# Patient Record
Sex: Female | Born: 1995 | Race: Black or African American | Hispanic: No | Marital: Single | Smoking: Current some day smoker
Health system: Southern US, Community
[De-identification: ages and names within clinical notes are randomized; demographics above are authoritative.]

## PROBLEM LIST (undated history)

## (undated) ENCOUNTER — Inpatient Hospital Stay (HOSPITAL_COMMUNITY): Payer: Self-pay

## (undated) DIAGNOSIS — R1116 Cannabis hyperemesis syndrome: Secondary | ICD-10-CM

## (undated) DIAGNOSIS — O24419 Gestational diabetes mellitus in pregnancy, unspecified control: Secondary | ICD-10-CM

## (undated) DIAGNOSIS — Z862 Personal history of diseases of the blood and blood-forming organs and certain disorders involving the immune mechanism: Secondary | ICD-10-CM

## (undated) DIAGNOSIS — F329 Major depressive disorder, single episode, unspecified: Secondary | ICD-10-CM

## (undated) DIAGNOSIS — F419 Anxiety disorder, unspecified: Secondary | ICD-10-CM

## (undated) DIAGNOSIS — F1290 Cannabis use, unspecified, uncomplicated: Secondary | ICD-10-CM

## (undated) DIAGNOSIS — K219 Gastro-esophageal reflux disease without esophagitis: Secondary | ICD-10-CM

## (undated) DIAGNOSIS — R0989 Other specified symptoms and signs involving the circulatory and respiratory systems: Secondary | ICD-10-CM

## (undated) DIAGNOSIS — G43909 Migraine, unspecified, not intractable, without status migrainosus: Secondary | ICD-10-CM

## (undated) DIAGNOSIS — R519 Headache, unspecified: Secondary | ICD-10-CM

## (undated) DIAGNOSIS — F32A Depression, unspecified: Secondary | ICD-10-CM

## (undated) DIAGNOSIS — R102 Pelvic and perineal pain unspecified side: Secondary | ICD-10-CM

## (undated) DIAGNOSIS — K8020 Calculus of gallbladder without cholecystitis without obstruction: Secondary | ICD-10-CM

## (undated) DIAGNOSIS — D649 Anemia, unspecified: Secondary | ICD-10-CM

## (undated) DIAGNOSIS — R112 Nausea with vomiting, unspecified: Secondary | ICD-10-CM

## (undated) DIAGNOSIS — E669 Obesity, unspecified: Secondary | ICD-10-CM

## (undated) DIAGNOSIS — N83209 Unspecified ovarian cyst, unspecified side: Secondary | ICD-10-CM

## (undated) DIAGNOSIS — Z8632 Personal history of gestational diabetes: Secondary | ICD-10-CM

## (undated) DIAGNOSIS — E119 Type 2 diabetes mellitus without complications: Secondary | ICD-10-CM

## (undated) DIAGNOSIS — R51 Headache: Secondary | ICD-10-CM

## (undated) DIAGNOSIS — N838 Other noninflammatory disorders of ovary, fallopian tube and broad ligament: Secondary | ICD-10-CM

## (undated) HISTORY — PX: TUBAL LIGATION: SHX77

## (undated) HISTORY — DX: Obesity, unspecified: E66.9

## (undated) HISTORY — DX: Other noninflammatory disorders of ovary, fallopian tube and broad ligament: N83.8

## (undated) HISTORY — DX: Gestational diabetes mellitus in pregnancy, unspecified control: O24.419

## (undated) HISTORY — PX: WISDOM TOOTH EXTRACTION: SHX21

---

## 2016-02-18 ENCOUNTER — Encounter (HOSPITAL_COMMUNITY): Payer: Self-pay

## 2016-02-18 ENCOUNTER — Inpatient Hospital Stay (HOSPITAL_COMMUNITY)
Admission: AD | Admit: 2016-02-18 | Discharge: 2016-02-19 | Disposition: A | Discharge status: Home / Self Care | Payer: Medicaid Other | Source: Ambulatory Visit | Attending: Obstetrics and Gynecology | Admitting: Obstetrics and Gynecology

## 2016-02-18 ENCOUNTER — Inpatient Hospital Stay (HOSPITAL_COMMUNITY): Payer: Medicaid Other

## 2016-02-18 DIAGNOSIS — R102 Pelvic and perineal pain: Secondary | ICD-10-CM | POA: Insufficient documentation

## 2016-02-18 DIAGNOSIS — A5901 Trichomonal vulvovaginitis: Secondary | ICD-10-CM | POA: Diagnosis present

## 2016-02-18 DIAGNOSIS — O98311 Other infections with a predominantly sexual mode of transmission complicating pregnancy, first trimester: Secondary | ICD-10-CM | POA: Diagnosis not present

## 2016-02-18 DIAGNOSIS — N838 Other noninflammatory disorders of ovary, fallopian tube and broad ligament: Secondary | ICD-10-CM | POA: Diagnosis not present

## 2016-02-18 DIAGNOSIS — N760 Acute vaginitis: Secondary | ICD-10-CM | POA: Insufficient documentation

## 2016-02-18 DIAGNOSIS — Z3A01 Less than 8 weeks gestation of pregnancy: Secondary | ICD-10-CM | POA: Diagnosis not present

## 2016-02-18 DIAGNOSIS — O23591 Infection of other part of genital tract in pregnancy, first trimester: Secondary | ICD-10-CM | POA: Diagnosis not present

## 2016-02-18 DIAGNOSIS — O26891 Other specified pregnancy related conditions, first trimester: Secondary | ICD-10-CM

## 2016-02-18 LAB — URINALYSIS, ROUTINE W REFLEX MICROSCOPIC
BILIRUBIN URINE: NEGATIVE
GLUCOSE, UA: NEGATIVE mg/dL
Hgb urine dipstick: NEGATIVE
KETONES UR: NEGATIVE mg/dL
Leukocytes, UA: NEGATIVE
NITRITE: NEGATIVE
PH: 5.5 (ref 5.0–8.0)
Protein, ur: NEGATIVE mg/dL
Specific Gravity, Urine: >1.03 — ABNORMAL HIGH (ref 1.005–1.030)

## 2016-02-18 LAB — CBC
HCT: 33.1 % — ABNORMAL LOW (ref 36.0–46.0)
Hemoglobin: 11 g/dL — ABNORMAL LOW (ref 12.0–15.0)
MCH: 25.6 pg — AB (ref 26.0–34.0)
MCHC: 33.2 g/dL (ref 30.0–36.0)
MCV: 77 fL — AB (ref 78.0–100.0)
PLATELETS: 354 10*3/uL (ref 150–400)
RBC: 4.3 MIL/uL (ref 3.87–5.11)
RDW: 15.1 % (ref 11.5–15.5)
WBC: 11.7 10*3/uL — ABNORMAL HIGH (ref 4.0–10.5)

## 2016-02-18 LAB — POCT PREGNANCY, URINE: Preg Test, Ur: POSITIVE — AB

## 2016-02-18 MED ORDER — FAMOTIDINE 20 MG PO TABS
20.0000 mg | ORAL_TABLET | Freq: Once | ORAL | Status: AC
  Administered 2016-02-19: 20 mg via ORAL
  Filled 2016-02-18: qty 1

## 2016-02-18 NOTE — MAU Provider Note (Signed)
Chief Complaint: Possible Pregnancy and Pelvic Pain   First Provider Initiated Contact with Patient 02/18/16 2248     SUBJECTIVE HPI: Christine Trujillo is a 20 y.o. G1P0 at 5474w2d who presents to [facility]aternity Admissions reporting nipple soreness 2 weeks and pelvic pain 1 week. Denies bleeding.  Location: Pelvis Quality: Cramping Severity: 4/10 on pain scale Duration: One week Course: Unchanged Context: Early pregnancy Timing: Intermittent Modifying factors: None. Hasn't tried anything for the pain. Associated signs and symptoms: Negative for vaginal bleeding, vaginal discharge, fever, chills, urinary complaints, diarrhea or constipation.  Past Medical History:  Diagnosis Date  . Medical history non-contributory    OB History  Gravida Para Term Preterm AB Living  1            SAB TAB Ectopic Multiple Live Births               # Outcome Date GA Lbr Len/2nd Weight Sex Delivery Anes PTL Lv  1 Current              Past Surgical History:  Procedure Laterality Date  . WISDOM TOOTH EXTRACTION     Social History   Social History  . Marital status: Single    Spouse name: N/A  . Number of children: N/A  . Years of education: N/A   Occupational History  . Not on file.   Social History Main Topics  . Smoking status: Never Smoker  . Smokeless tobacco: Never Used  . Alcohol use No  . Drug use:     Types: Marijuana     Comment: last use Nov 2017  . Sexual activity: Yes    Other Topics Concern  . Not on file   Social History Narrative  . No narrative on file   No current facility-administered medications on file prior to encounter.    No current outpatient prescriptions on file prior to encounter.   No Known Allergies  I have reviewed the past Medical Hx, Surgical Hx, Social Hx, Allergies and Medications.   Review of Systems  Constitutional: Negative for chills and fever.  Gastrointestinal: Negative for abdominal pain, constipation, diarrhea, nausea and vomiting.   Genitourinary: Positive for pelvic pain. Negative for dysuria, frequency, hematuria, urgency, vaginal bleeding and vaginal discharge.    OBJECTIVE Patient Vitals for the past 24 hrs:  BP Temp Temp src Pulse Resp SpO2 Height Weight  02/19/16 0157 128/86 98 F (36.7 C) Oral 85 17 100 % - -  02/18/16 2241 131/78 98.3 F (36.8 C) Oral 88 16 100 % - -  02/18/16 2228 - - - - - - 5\' 3"  (1.6 m) 278 lb 8 oz (126.3 kg)   Constitutional: Well-developed, well-nourished, obese female in no acute distress.  Cardiovascular: normal rate Respiratory: normal rate and effort.  GI: Abd soft, non-tender. Pos BS x 4 MS: Extremities nontender, no edema, normal ROM Neurologic: Alert and oriented x 4.  GU: Neg CVAT.  SPECULUM EXAM: NEFG, small amount of thin, white, mildly malodorous discharge, no blood noted, cervix clean  BIMANUAL: cervix closed; unable to assess uterine size due to body habitus, no adnexal tenderness or masses. No CMT.  LAB RESULTS Results for orders placed or performed during the hospital encounter of 02/18/16 (from the past 24 hour(s))  Urinalysis, Routine w reflex microscopic (not at [facility])     Status: Abnormal   Collection Time: 02/18/16 10:24 PM  Result Value Ref Range   Color, Urine YELLOW YELLOW   APPearance CLEAR CLEAR  Specific Gravity, Urine >1.030 (H) 1.005 - 1.030   pH 5.5 5.0 - 8.0   Glucose, UA NEGATIVE NEGATIVE mg/dL   Hgb urine dipstick NEGATIVE NEGATIVE   Bilirubin Urine NEGATIVE NEGATIVE   Ketones, ur NEGATIVE NEGATIVE mg/dL   Protein, ur NEGATIVE NEGATIVE mg/dL   Nitrite NEGATIVE NEGATIVE   Leukocytes, UA NEGATIVE NEGATIVE  Pregnancy, urine POC     Status: Abnormal   Collection Time: 02/18/16 10:34 PM  Result Value Ref Range   Preg Test, Ur POSITIVE (A) NEGATIVE  hCG, quantitative, pregnancy     Status: Abnormal   Collection Time: 02/18/16 11:06 PM  Result Value Ref Range   hCG, Beta Chain, Quant, S 3,029 (H) <5 mIU/mL  CBC     Status: Abnormal    Collection Time: 02/18/16 11:06 PM  Result Value Ref Range   WBC 11.7 (H) 4.0 - 10.5 K/uL   RBC 4.30 3.87 - 5.11 MIL/uL   Hemoglobin 11.0 (L) 12.0 - 15.0 g/dL   HCT 96.033.1 (L) 45.436.0 - 09.846.0 %   MCV 77.0 (L) 78.0 - 100.0 fL   MCH 25.6 (L) 26.0 - 34.0 pg   MCHC 33.2 30.0 - 36.0 g/dL   RDW 11.915.1 14.711.5 - 82.915.5 %   Platelets 354 150 - 400 K/uL  ABO/Rh     Status: None (Preliminary result)   Collection Time: 02/18/16 11:06 PM  Result Value Ref Range   ABO/RH(D) B POS   Wet prep, genital     Status: Abnormal   Collection Time: 02/19/16 12:49 AM  Result Value Ref Range   Yeast Wet Prep HPF POC NONE SEEN NONE SEEN   Trich, Wet Prep PRESENT (A) NONE SEEN   Clue Cells Wet Prep HPF POC PRESENT (A) NONE SEEN   WBC, Wet Prep HPF POC FEW (A) NONE SEEN   Sperm NONE SEEN     IMAGING [place] Ob Comp Less 14 Wks  Result Date: 02/18/2016 CLINICAL DATA:  Pelvic pain for 1 week EXAM: OBSTETRIC <14 WK US AND TRANSVAGINAL OB US TECHNIQUE: Both transabdominal and transvaginal ultrasound examinations were performed for complete evaluation of the gestation as well as the maternal uterus, adnexal regions, and pelvic cul-de-sac. Transvaginal technique was performed to assess early pregnancy. COMPARISON:  None. FINDINGS: Intrauterine gestational sac: Single Yolk sac:  Visualized. Embryo:  Not Visualized. Cardiac Activity: Not Visualized. Heart Rate: Not applicable MSD: 6.5  mm   5 w   2  d Subchorionic hemorrhage:  None visualized. There is trace free fluid. Maternal uterus/adnexae: Within the left adnexa, separate from the left ovary is an anechoic simple appearing well-circumscribed 16.7 x 9 x 14.4 cm cystic mass without mural nodularity or septation. Differential possibilities might include a large paraovarian cyst, enteric duplication cyst or possibly seroma from prior surgery among some considerations though not exclusive. IMPRESSION: Probable early intrauterine pregnancy with gestational sac and yolk sac but no fetal pole,  or cardiac activity yet visualized. Recommend follow-up quantitative B-HCG levels and follow-up US in 14 days to assess viability. This recommendation follows SRU consensus guidelines: Malva Limes Engl J Med 2013; 562:1308-65369:1443-51. Left adnexal anechoic circumscribed 16.7 x 9 x 14.4 cm simple cyst. This will also need sonographic monitoring. Cross-sectional imaging is not suggested at this time given the possibility of early intrauterine gestation. Electronically Signed   By: Tollie Ethavid  Kwon M.D.   On: 02/18/2016 23:59   [place] Ob Transvaginal  Result Date: 02/18/2016 CLINICAL DATA:  Pelvic pain for 1 week EXAM: OBSTETRIC <14 WK  [place] AND TRANSVAGINAL OB US TECHNIQUE: Both transabdominal and transvaginal ultrasound examinations were performed for complete evaluation of the gestation as well as the maternal uterus, adnexal regions, and pelvic cul-de-sac. Transvaginal technique was performed to assess early pregnancy. COMPARISON:  None. FINDINGS: Intrauterine gestational sac: Single Yolk sac:  Visualized. Embryo:  Not Visualized. Cardiac Activity: Not Visualized. Heart Rate: Not applicable MSD: 6.5  mm   5 w   2  d Subchorionic hemorrhage:  None visualized. There is trace free fluid. Maternal uterus/adnexae: Within the left adnexa, separate from the left ovary is an anechoic simple appearing well-circumscribed 16.7 x 9 x 14.4 cm cystic mass without mural nodularity or septation. Differential possibilities might include a large paraovarian cyst, enteric duplication cyst or possibly seroma from prior surgery among some considerations though not exclusive. IMPRESSION: Probable early intrauterine pregnancy with gestational sac and yolk sac but no fetal pole, or cardiac activity yet visualized. Recommend follow-up quantitative B-HCG levels and follow-up US in 14 days to assess viability. This recommendation follows SRU consensus guidelines: Malva Limes Med 2013; 409:8119-14. Left adnexal anechoic circumscribed 16.7 x 9 x 14.4 cm simple cyst. This  will also need sonographic monitoring. Cross-sectional imaging is not suggested at this time given the possibility of early intrauterine gestation. Electronically Signed   By: Tollie Eth M.D.   On: 02/18/2016 23:59    MAU COURSE CBC, Quant, ABO/Rh, ultrasound, wet prep and GC/chlamydia culture, UA.  Notified of diagnosis of trichomonas. Patient treated with Flagyl in MAU. Discussed with patient's partner, Verline Lema, date of birth 05/23/1982. Expedited partner therapy prescription supplied. Verified that partner does not have allergy to Flagyl. Patient and her partner admit that they had a third sex partner. CNM offered expedited partner therapy to her as well, but they said that they're no longer involved with her. CNM recommended that they inform her and recommended that she get treated.  MDM - Pain in early pregnancy with normal intrauterine pregnancy and hemodynamically stable. Pain likely due to paratubal cyst. Patient in no distress. No evidence of torsion.  -  Trichomonas, treated  ASSESSMENT 1. Pelvic pain affecting pregnancy in first trimester, antepartum   2. Vaginal trichomoniasis   3. Paratubal cyst     PLAN Discharge home in stable conditionPer consult with Dr. Emelda Fear. First trimester and torsion precautions Pregnancy verification letter given. Comfort measures, Tylenol when necessary. Rx prenatal vitamin. Follow-up Information    [facility].   Specialty:  Obstetrics and Gynecology Why:  Will call you to schedule your new OB appointment Contact information: [address] [place] [address] [phone]       [facility]  MATERNITY ADMISSIONS Follow up.   Why:  As needed for pregnancy emergencies Contact information: [address] [id number] mc [place] [address] [phone]           Medication List    TAKE these medications   CONCEPT OB 130-92.4-1  MG Caps Take 1 tablet by mouth daily.        Revillo, CNM 02/19/2016  2:31 AM  4

## 2016-02-18 NOTE — MAU Note (Signed)
Nipples hurting x 2 weeks.  Pelvic pain x 1 week.  No bleeding.

## 2016-02-19 ENCOUNTER — Encounter: Payer: Self-pay | Admitting: Obstetrics & Gynecology

## 2016-02-19 DIAGNOSIS — N838 Other noninflammatory disorders of ovary, fallopian tube and broad ligament: Secondary | ICD-10-CM

## 2016-02-19 DIAGNOSIS — A5901 Trichomonal vulvovaginitis: Secondary | ICD-10-CM | POA: Diagnosis present

## 2016-02-19 DIAGNOSIS — O98311 Other infections with a predominantly sexual mode of transmission complicating pregnancy, first trimester: Secondary | ICD-10-CM

## 2016-02-19 HISTORY — DX: Other noninflammatory disorders of ovary, fallopian tube and broad ligament: N83.8

## 2016-02-19 LAB — WET PREP, GENITAL
Sperm: NONE SEEN
Yeast Wet Prep HPF POC: NONE SEEN

## 2016-02-19 LAB — HIV ANTIBODY (ROUTINE TESTING W REFLEX): HIV Screen 4th Generation wRfx: NONREACTIVE

## 2016-02-19 LAB — ABO/RH: ABO/RH(D): B POS

## 2016-02-19 LAB — HCG, QUANTITATIVE, PREGNANCY: HCG, BETA CHAIN, QUANT, S: 3029 m[IU]/mL — AB (ref <5)

## 2016-02-19 LAB — GC/CHLAMYDIA PROBE AMP (~~LOC~~) NOT AT ARMC
Chlamydia: NEGATIVE
NEISSERIA GONORRHEA: NEGATIVE

## 2016-02-19 MED ORDER — PROMETHAZINE HCL 25 MG PO TABS
25.0000 mg | ORAL_TABLET | Freq: Once | ORAL | Status: AC
  Administered 2016-02-19: 25 mg via ORAL
  Filled 2016-02-19: qty 1

## 2016-02-19 MED ORDER — METRONIDAZOLE 500 MG PO TABS
2000.0000 mg | ORAL_TABLET | Freq: Once | ORAL | Status: AC
  Administered 2016-02-19: 2000 mg via ORAL
  Filled 2016-02-19: qty 4

## 2016-02-19 MED ORDER — CONCEPT OB 130-92.4-1 MG PO CAPS: 1.0000 | ORAL_CAPSULE | Freq: Every day | ORAL | 12 refills | Status: DC

## 2016-02-19 NOTE — Discharge Instructions (Signed)
You have been diagnosed with a paratubal cyst measuring 16 cm. It is uncertain whether you'll require any treatment for this cyst during the pregnancy, but it will be monitored at your anatomy ultrasound and after the pregnancy.  Trichomoniasis Trichomoniasis is an infection caused by an organism called Trichomonas. The infection can affect both women and men. In women, the outer female genitalia and the vagina are affected. In men, the penis is mainly affected, but the prostate and other reproductive organs can also be involved. Trichomoniasis is a sexually transmitted infection (STI) and is most often passed to another person through sexual contact.  RISK FACTORS  Having unprotected sexual intercourse.  Having sexual intercourse with an infected partner. SIGNS AND SYMPTOMS  Symptoms of trichomoniasis in women include:  Abnormal gray-green frothy vaginal discharge.  Itching and irritation of the vagina.  Itching and irritation of the area outside the vagina. Symptoms of trichomoniasis in men include:   Penile discharge with or without pain.  Pain during urination. This results from inflammation of the urethra. DIAGNOSIS  Trichomoniasis may be found during a Pap test or physical exam. Your health care provider may use one of the following methods to help diagnose this infection:  Testing the pH of the vagina with a test tape.  Using a vaginal swab test that checks for the Trichomonas organism. A test is available that provides results within a few minutes.  Examining a urine sample.  Testing vaginal secretions. Your health care provider may test you for other STIs, including HIV. TREATMENT   You may be given medicine to fight the infection. Women should inform their health care provider if they could be or are pregnant. Some medicines used to treat the infection should not be taken during pregnancy.  Your health care provider may recommend over-the-counter medicines or creams to  decrease itching or irritation.  Your sexual partner will need to be treated if infected.  Your health care provider may test you for infection again 3 months after treatment. HOME CARE INSTRUCTIONS   Take medicines only as directed by your health care provider.  Take over-the-counter medicine for itching or irritation as directed by your health care provider.  Do not have sexual intercourse while you have the infection.  Women should not douche or wear tampons while they have the infection.  Discuss your infection with your partner. Your partner may have gotten the infection from you, or you may have gotten it from your partner.  Have your sex partner get examined and treated if necessary.  Practice safe, informed, and protected sex.  See your health care provider for other STI testing. SEEK MEDICAL CARE IF:   You still have symptoms after you finish your medicine.  You develop abdominal pain.  You have pain when you urinate.  You have bleeding after sexual intercourse.  You develop a rash.  Your medicine makes you sick or makes you throw up (vomit). MAKE SURE YOU:  Understand these instructions.  Will watch your condition.  Will get help right away if you are not doing well or get worse.   This information is not intended to replace advice given to you by your health care provider. Make sure you discuss any questions you have with your health care provider.   Document Released: 09/22/2000 Document Revised: 04/19/2014 Document Reviewed: 01/08/2013 Elsevier Interactive Patient Education [employer]2016 [employer] Inc[employer]   First Trimester of Pregnancy The first trimester of pregnancy is from week 1 until the end of week 12 (  months 1 through 3). A week after a sperm fertilizes an egg, the egg will implant on the wall of the uterus. This embryo will begin to develop into a baby. Genes from you and your partner are forming the baby. The female genes determine whether the baby is a boy  or a girl. At 6-8 weeks, the eyes and face are formed, and the heartbeat can be seen on ultrasound. At the end of 12 weeks, all the baby's organs are formed.  Now that you are pregnant, you will want to do everything you can to have a healthy baby. Two of the most important things are to get good prenatal care and to follow your health care provider's instructions. Prenatal care is all the medical care you receive before the baby's birth. This care will help prevent, find, and treat any problems during the pregnancy and childbirth. BODY CHANGES Your body goes through many changes during pregnancy. The changes vary from woman to woman.   You may gain or lose a couple of pounds at first.  You may feel sick to your stomach (nauseous) and throw up (vomit). If the vomiting is uncontrollable, call your health care provider.  You may tire easily.  You may develop headaches that can be relieved by medicines approved by your health care provider.  You may urinate more often. Painful urination may mean you have a bladder infection.  You may develop heartburn as a result of your pregnancy.  You may develop constipation because certain hormones are causing the muscles that push waste through your intestines to slow down.  You may develop hemorrhoids or swollen, bulging veins (varicose veins).  Your breasts may begin to grow larger and become tender. Your nipples may stick out more, and the tissue that surrounds them (areola) may become darker.  Your gums may bleed and may be sensitive to brushing and flossing.  Dark spots or blotches (chloasma, mask of pregnancy) may develop on your face. This will likely fade after the baby is born.  Your menstrual periods will stop.  You may have a loss of appetite.  You may develop cravings for certain kinds of food.  You may have changes in your emotions from day to day, such as being excited to be pregnant or being concerned that something may go wrong with  the pregnancy and baby.  You may have more vivid and strange dreams.  You may have changes in your hair. These can include thickening of your hair, rapid growth, and changes in texture. Some women also have hair loss during or after pregnancy, or hair that feels dry or thin. Your hair will most likely return to normal after your baby is born. WHAT TO EXPECT AT YOUR PRENATAL VISITS During a routine prenatal visit:  You will be weighed to make sure you and the baby are growing normally.  Your blood pressure will be taken.  Your abdomen will be measured to track your baby's growth.  The fetal heartbeat will be listened to starting around week 10 or 12 of your pregnancy.  Test results from any previous visits will be discussed. Your health care provider may ask you:  How you are feeling.  If you are feeling the baby move.  If you have had any abnormal symptoms, such as leaking fluid, bleeding, severe headaches, or abdominal cramping.  If you are using any tobacco products, including cigarettes, chewing tobacco, and electronic cigarettes.  If you have any questions. Other tests that may be  performed during your first trimester include:  Blood tests to find your blood type and to check for the presence of any previous infections. They will also be used to check for low iron levels (anemia) and Rh antibodies. Later in the pregnancy, blood tests for diabetes will be done along with other tests if problems develop.  Urine tests to check for infections, diabetes, or protein in the urine.  An ultrasound to confirm the proper growth and development of the baby.  An amniocentesis to check for possible genetic problems.  Fetal screens for spina bifida and Down syndrome.  You may need other tests to make sure you and the baby are doing well.  HIV (human immunodeficiency virus) testing. Routine prenatal testing includes screening for HIV, unless you choose not to have this test. HOME CARE  INSTRUCTIONS  Medicines  Follow your health care provider's instructions regarding medicine use. Specific medicines may be either safe or unsafe to take during pregnancy.  Take your prenatal vitamins as directed.  If you develop constipation, try taking a stool softener if your health care provider approves. Diet  Eat regular, well-balanced meals. Choose a variety of foods, such as meat or vegetable-based protein, fish, milk and low-fat dairy products, vegetables, fruits, and whole grain breads and cereals. Your health care provider will help you determine the amount of weight gain that is right for you.  Avoid raw meat and uncooked cheese. These carry germs that can cause birth defects in the baby.  Eating four or five small meals rather than three large meals a day may help relieve nausea and vomiting. If you start to feel nauseous, eating a few soda crackers can be helpful. Drinking liquids between meals instead of during meals also seems to help nausea and vomiting.  If you develop constipation, eat more high-fiber foods, such as fresh vegetables or fruit and whole grains. Drink enough fluids to keep your urine clear or pale yellow. Activity and Exercise  Exercise only as directed by your health care provider. Exercising will help you:  Control your weight.  Stay in shape.  Be prepared for labor and delivery.  Experiencing pain or cramping in the lower abdomen or low back is a good sign that you should stop exercising. Check with your health care provider before continuing normal exercises.  Try to avoid standing for long periods of time. Move your legs often if you must stand in one place for a long time.  Avoid heavy lifting.  Wear low-heeled shoes, and practice good posture.  You may continue to have sex unless your health care provider directs you otherwise. Relief of Pain or Discomfort  Wear a good support bra for breast tenderness.   Take warm sitz baths to soothe  any pain or discomfort caused by hemorrhoids. Use hemorrhoid cream if your health care provider approves.   Rest with your legs elevated if you have leg cramps or low back pain.  If you develop varicose veins in your legs, wear support hose. Elevate your feet for 15 minutes, 3-4 times a day. Limit salt in your diet. Prenatal Care  Schedule your prenatal visits by the twelfth week of pregnancy. They are usually scheduled monthly at first, then more often in the last 2 months before delivery.  Write down your questions. Take them to your prenatal visits.  Keep all your prenatal visits as directed by your health care provider. Safety  Wear your seat belt at all times when driving.  Make a list  of emergency phone numbers, including numbers for family, friends, the hospital, and police and fire departments. General Tips  Ask your health care provider for a referral to a local prenatal education class. Begin classes no later than at the beginning of month 6 of your pregnancy.  Ask for help if you have counseling or nutritional needs during pregnancy. Your health care provider can offer advice or refer you to specialists for help with various needs.  Do not use hot tubs, steam rooms, or saunas.  Do not douche or use tampons or scented sanitary pads.  Do not cross your legs for long periods of time.  Avoid cat litter boxes and soil used by cats. These carry germs that can cause birth defects in the baby and possibly loss of the fetus by miscarriage or stillbirth.  Avoid all smoking, herbs, alcohol, and medicines not prescribed by your health care provider. Chemicals in these affect the formation and growth of the baby.  Do not use any tobacco products, including cigarettes, chewing tobacco, and electronic cigarettes. If you need help quitting, ask your health care provider. You may receive counseling support and other resources to help you quit.  Schedule a dentist appointment. At home,  brush your teeth with a soft toothbrush and be gentle when you floss. SEEK MEDICAL CARE IF:   You have dizziness.  You have mild pelvic cramps, pelvic pressure, or nagging pain in the abdominal area.  You have persistent nausea, vomiting, or diarrhea.  You have a bad smelling vaginal discharge.  You have pain with urination.  You notice increased swelling in your face, hands, legs, or ankles. SEEK IMMEDIATE MEDICAL CARE IF:   You have a fever.  You are leaking fluid from your vagina.  You have spotting or bleeding from your vagina.  You have severe abdominal cramping or pain.  You have rapid weight gain or loss.  You vomit blood or material that looks like coffee grounds.  You are exposed to [place] measles and have never had them.  You are exposed to fifth disease or chickenpox.  You develop a severe headache.  You have shortness of breath.  You have any kind of trauma, such as from a fall or a car accident.   This information is not intended to replace advice given to you by your health care provider. Make sure you discuss any questions you have with your health care provider.   Document Released: 03/23/2001 Document Revised: 04/19/2014 Document Reviewed: 02/06/2013 Elsevier Interactive Patient Education [employer]

## 2016-03-18 ENCOUNTER — Telehealth: Payer: Self-pay

## 2016-03-18 NOTE — Telephone Encounter (Signed)
error 

## 2016-03-25 ENCOUNTER — Ambulatory Visit (INDEPENDENT_AMBULATORY_CARE_PROVIDER_SITE_OTHER): Payer: Medicaid Other | Admitting: Family

## 2016-03-25 ENCOUNTER — Encounter: Payer: Self-pay | Admitting: Family

## 2016-03-25 ENCOUNTER — Ambulatory Visit: Payer: Self-pay

## 2016-03-25 ENCOUNTER — Other Ambulatory Visit (HOSPITAL_COMMUNITY)
Admission: RE | Admit: 2016-03-25 | Discharge: 2016-03-25 | Disposition: A | Discharge status: Home / Self Care | Payer: Medicaid Other | Source: Ambulatory Visit | Attending: Family | Admitting: Family

## 2016-03-25 VITALS — BP 139/64 | HR 84 | Wt 274.2 lb

## 2016-03-25 DIAGNOSIS — Z3401 Encounter for supervision of normal first pregnancy, first trimester: Secondary | ICD-10-CM

## 2016-03-25 DIAGNOSIS — O219 Vomiting of pregnancy, unspecified: Secondary | ICD-10-CM | POA: Diagnosis not present

## 2016-03-25 DIAGNOSIS — O3482 Maternal care for other abnormalities of pelvic organs, second trimester: Secondary | ICD-10-CM

## 2016-03-25 DIAGNOSIS — Z01419 Encounter for gynecological examination (general) (routine) without abnormal findings: Secondary | ICD-10-CM | POA: Insufficient documentation

## 2016-03-25 DIAGNOSIS — Z113 Encounter for screening for infections with a predominantly sexual mode of transmission: Secondary | ICD-10-CM | POA: Insufficient documentation

## 2016-03-25 DIAGNOSIS — N949 Unspecified condition associated with female genital organs and menstrual cycle: Secondary | ICD-10-CM | POA: Insufficient documentation

## 2016-03-25 DIAGNOSIS — O3480 Maternal care for other abnormalities of pelvic organs, unspecified trimester: Secondary | ICD-10-CM | POA: Diagnosis not present

## 2016-03-25 DIAGNOSIS — Z3689 Encounter for other specified antenatal screening: Secondary | ICD-10-CM

## 2016-03-25 DIAGNOSIS — O3680X0 Pregnancy with inconclusive fetal viability, not applicable or unspecified: Secondary | ICD-10-CM

## 2016-03-25 DIAGNOSIS — N83209 Unspecified ovarian cyst, unspecified side: Secondary | ICD-10-CM

## 2016-03-25 DIAGNOSIS — O0992 Supervision of high risk pregnancy, unspecified, second trimester: Secondary | ICD-10-CM | POA: Insufficient documentation

## 2016-03-25 DIAGNOSIS — Z3400 Encounter for supervision of normal first pregnancy, unspecified trimester: Secondary | ICD-10-CM

## 2016-03-25 MED ORDER — PROMETHAZINE HCL 12.5 MG PO TABS: 12.5000 mg | ORAL_TABLET | Freq: Four times a day (QID) | ORAL | 0 refills | Status: DC | PRN

## 2016-03-25 NOTE — Progress Notes (Signed)
  Subjective:    Christine Trujillo is a G1P0 6750w3d being seen today for her first obstetrical visit.  Her obstetrical history is significant for lleft ovarian cyst identified in first trimester. [facility] (see Care Everywhere).  Here with FOB.  This is her first child and his 5th.  Patient does not intend to breast feed. Worried nipples may hurt.   Pregnancy history fully reviewed.  Patient reports no bleeding and intermittent pelvic pain.  Vitals:   03/25/16 0923  BP: 139/64  Pulse: 84  Weight: 274 lb 3.2 oz (124.4 kg)    HISTORY: OB History  Gravida Para Term Preterm AB Living  1            SAB TAB Ectopic Multiple Live Births               # Outcome Date GA Lbr Len/2nd Weight Sex Delivery Anes PTL Lv  1 Current              Past Medical History:  Diagnosis Date  . Medical history non-contributory    Past Surgical History:  Procedure Laterality Date  . WISDOM TOOTH EXTRACTION     Family History  Problem Relation Age of Onset  . Heart disease Mother      Exam    BP 139/64   Pulse 84   Wt 274 lb 3.2 oz (124.4 kg)   LMP 01/12/2016 Comment: home UPT x2 positive  BMI 48.57 kg/m  Uterine Size: size equals dates  Pelvic Exam:    Perineum: Small Hemorrhoid, Normal Perineum   Vulva: normal   Vagina:  normal mucosa, normal discharge, no palpable nodules   pH: Not done   Cervix: no bleeding following Pap, no cervical motion tenderness and no lesions   Adnexa: normal adnexa and no mass, fullness, tenderness   Bony Pelvis: Adequate  System: Breast:  No nipple retraction or dimpling, No nipple discharge or bleeding, No axillary or supraclavicular adenopathy, Normal to palpation without dominant masses   Skin: normal coloration and turgor, no rashes    Neurologic: negative   Extremities: normal strength, tone, and muscle mass   HEENT neck supple with midline trachea and thyroid without masses   Mouth/Teeth mucous membranes moist,  pharynx normal without lesions   Neck supple and no masses   Cardiovascular: regular rate and rhythm, no murmurs or gallops   Respiratory:  appears well, vitals normal, no respiratory distress, acyanotic, normal RR, neck free of mass or lymphadenopathy, chest clear, no wheezing, crepitations, rhonchi, normal symmetric air entry   Abdomen: soft, non-tender; bowel sounds normal; no masses,  no organomegaly   Urinary: urethral meatus normal     Assessment:    Pregnancy: G1P0 Patient Active Problem List   Diagnosis Date Noted  . Supervision of normal pregnancy, antepartum 03/25/2016  . Ovarian cyst affecting pregnancy, antepartum 03/25/2016  . Paratubal cyst 02/19/2016  . Vaginal trichomoniasis in Pregnancy 02/19/2016        Plan:     Initial labs NOT drawn - pt left after viability scan. Pap smear collected. Prenatal vitamins. Problem list reviewed and updated. Genetic Screening discussed First Screen: ordered. Consulted with Dr. Alysia PennaErvin > repeat ovarian scan with NT Follow up in 4 weeks.  Christine EdelsonKARIM, Christine Trujillo N 03/25/2016

## 2016-03-25 NOTE — Patient Instructions (Signed)
Second Trimester of Pregnancy The second trimester is from week 13 through week 28 (months 4 through 6). The second trimester is often a time when you feel your best. Your body has also adjusted to being pregnant, and you begin to feel better physically. Usually, morning sickness has lessened or quit completely, you may have more energy, and you may have an increase in appetite. The second trimester is also a time when the fetus is growing rapidly. At the end of the sixth month, the fetus is about 9 inches long and weighs about 1 pounds. You will likely begin to feel the baby move (quickening) between 18 and 20 weeks of the pregnancy. Body changes during your second trimester Your body continues to go through many changes during your second trimester. The changes vary from woman to woman.  Your weight will continue to increase. You will notice your lower abdomen bulging out.  You may begin to get stretch marks on your hips, abdomen, and breasts.  You may develop headaches that can be relieved by medicines. The medicines should be approved by your health care provider.  You may urinate more often because the fetus is pressing on your bladder.  You may develop or continue to have heartburn as a result of your pregnancy.  You may develop constipation because certain hormones are causing the muscles that push waste through your intestines to slow down.  You may develop hemorrhoids or swollen, bulging veins (varicose veins).  You may have back pain. This is caused by:  Weight gain.  Pregnancy hormones that are relaxing the joints in your pelvis.  A shift in weight and the muscles that support your balance.  Your breasts will continue to grow and they will continue to become tender.  Your gums may bleed and may be sensitive to brushing and flossing.  Dark spots or blotches (chloasma, mask of pregnancy) may develop on your face. This will likely fade after the baby is born.  A dark line  from your belly button to the pubic area (linea nigra) may appear. This will likely fade after the baby is born.  You may have changes in your hair. These can include thickening of your hair, rapid growth, and changes in texture. Some women also have hair loss during or after pregnancy, or hair that feels dry or thin. Your hair will most likely return to normal after your baby is born. What to expect at prenatal visits During a routine prenatal visit:  You will be weighed to make sure you and the fetus are growing normally.  Your blood pressure will be taken.  Your abdomen will be measured to track your baby's growth.  The fetal heartbeat will be listened to.  Any test results from the previous visit will be discussed. Your health care provider may ask you:  How you are feeling.  If you are feeling the baby move.  If you have had any abnormal symptoms, such as leaking fluid, bleeding, severe headaches, or abdominal cramping.  If you are using any tobacco products, including cigarettes, chewing tobacco, and electronic cigarettes.  If you have any questions. Other tests that may be performed during your second trimester include:  Blood tests that check for:  Low iron levels (anemia).  Gestational diabetes (between 24 and 28 weeks).  Rh antibodies. This is to check for a protein on red blood cells (Rh factor).  Urine tests to check for infections, diabetes, or protein in the urine.  An ultrasound to   confirm the proper growth and development of the baby.  An amniocentesis to check for possible genetic problems.  Fetal screens for spina bifida and Down syndrome.  HIV (human immunodeficiency virus) testing. Routine prenatal testing includes screening for HIV, unless you choose not to have this test. Follow these instructions at home: Eating and drinking  Continue to eat regular, healthy meals.  Avoid raw meat, uncooked cheese, cat litter boxes, and soil used by cats. These  carry germs that can cause birth defects in the baby.  Take your prenatal vitamins.  Take 1500-2000 mg of calcium daily starting at the 20th week of pregnancy until you deliver your baby.  If you develop constipation:  Take over-the-counter or prescription medicines.  Drink enough fluid to keep your urine clear or pale yellow.  Eat foods that are high in fiber, such as fresh fruits and vegetables, whole grains, and beans.  Limit foods that are high in fat and processed sugars, such as fried and sweet foods. Activity  Exercise only as directed by your health care provider. Experiencing uterine cramps is a good sign to stop exercising.  Avoid heavy lifting, wear low heel shoes, and practice good posture.  Wear your seat belt at all times when driving.  Rest with your legs elevated if you have leg cramps or low back pain.  Wear a good support bra for breast tenderness.  Do not use hot tubs, steam rooms, or saunas. Lifestyle  Avoid all smoking, herbs, alcohol, and unprescribed drugs. These chemicals affect the formation and growth of the baby.  Do not use any products that contain nicotine or tobacco, such as cigarettes and e-cigarettes. If you need help quitting, ask your health care provider.  A sexual relationship may be continued unless your health care provider directs you otherwise. General instructions  Follow your health care provider's instructions regarding medicine use. There are medicines that are either safe or unsafe to take during pregnancy.  Take warm sitz baths to soothe any pain or discomfort caused by hemorrhoids. Use hemorrhoid cream if your health care provider approves.  If you develop varicose veins, wear support hose. Elevate your feet for 15 minutes, 3-4 times a day. Limit salt in your diet.  Visit your dentist if you have not gone yet during your pregnancy. Use a soft toothbrush to brush your teeth and be gentle when you floss.  Keep all follow-up  prenatal visits as told by your health care provider. This is important. Contact a health care provider if:  You have dizziness.  You have mild pelvic cramps, pelvic pressure, or nagging pain in the abdominal area.  You have persistent nausea, vomiting, or diarrhea.  You have a bad smelling vaginal discharge.  You have pain with urination. Get help right away if:  You have a fever.  You are leaking fluid from your vagina.  You have spotting or bleeding from your vagina.  You have severe abdominal cramping or pain.  You have rapid weight gain or weight loss.  You have shortness of breath with chest pain.  You notice sudden or extreme swelling of your face, hands, ankles, feet, or legs.  You have not felt your baby move in over an hour.  You have severe headaches that do not go away with medicine.  You have vision changes. Summary  The second trimester is from week 13 through week 28 (months 4 through 6). It is also a time when the fetus is growing rapidly.  Your body goes   through many changes during pregnancy. The changes vary from woman to woman.  Avoid all smoking, herbs, alcohol, and unprescribed drugs. These chemicals affect the formation and growth your baby.  Do not use any tobacco products, such as cigarettes, chewing tobacco, and e-cigarettes. If you need help quitting, ask your health care provider.  Contact your health care provider if you have any questions. Keep all prenatal visits as told by your health care provider. This is important. This information is not intended to replace advice given to you by your health care provider. Make sure you discuss any questions you have with your health care provider. Document Released: 03/23/2001 Document Revised: 09/04/2015 Document Reviewed: 05/30/2012 Elsevier Interactive Patient Education  2017 Elsevier Inc.  

## 2016-03-25 NOTE — Progress Notes (Signed)
Pt informed that the ultrasound is considered a limited OB ultrasound and is not intended to be a complete ultrasound exam.  Patient also informed that the ultrasound is not being completed with the intent of assessing for fetal or placental anomalies or any pelvic abnormalities.  Explained that the purpose of today's ultrasound is to assess for viability.  Patient acknowledges the purpose of the exam and the limitations of the study.     Single IUP;  FHR - 160 bpm per M-mode;  FM present

## 2016-03-25 NOTE — Progress Notes (Signed)
Constipation  Patient thinks she has hemorrhoid

## 2016-03-26 LAB — POCT URINALYSIS DIP (DEVICE)
Bilirubin Urine: NEGATIVE
Glucose, UA: NEGATIVE mg/dL
HGB URINE DIPSTICK: NEGATIVE
Ketones, ur: NEGATIVE mg/dL
Nitrite: NEGATIVE
PH: 6.5 (ref 5.0–8.0)
PROTEIN: 30 mg/dL — AB
SPECIFIC GRAVITY, URINE: 1.025 (ref 1.005–1.030)
Urobilinogen, UA: 0.2 mg/dL (ref 0.0–1.0)

## 2016-03-26 LAB — GC/CHLAMYDIA PROBE AMP (~~LOC~~) NOT AT ARMC
Chlamydia: NEGATIVE
NEISSERIA GONORRHEA: NEGATIVE

## 2016-03-26 LAB — CULTURE, OB URINE

## 2016-03-26 NOTE — Addendum Note (Signed)
Addended by: Marlis EdelsonKARIM, Romell Cavanah N on: 03/26/2016 10:01 AM   Modules accepted: Orders

## 2016-03-29 ENCOUNTER — Encounter (HOSPITAL_COMMUNITY): Payer: Self-pay | Admitting: Family

## 2016-03-29 LAB — CYTOLOGY - PAP: DIAGNOSIS: NEGATIVE

## 2016-04-02 LAB — PAIN MGMT, PROFILE 6 CONF W/O MM, U
6 Acetylmorphine: NEGATIVE ng/mL (ref <10)
Alcohol Metabolites: NEGATIVE ng/mL (ref <500)
Amphetamines: NEGATIVE ng/mL (ref <500)
BENZODIAZEPINES: NEGATIVE ng/mL (ref <100)
Barbiturates: NEGATIVE ng/mL (ref <300)
Cocaine Metabolite: NEGATIVE ng/mL (ref <150)
Creatinine: 320.1 mg/dL (ref >=20.0)
MARIJUANA METABOLITE: 15 ng/mL — AB (ref <5)
MARIJUANA METABOLITE: POSITIVE ng/mL — AB (ref <20)
METHADONE METABOLITE: NEGATIVE ng/mL (ref <100)
OPIATES: NEGATIVE ng/mL (ref <100)
OXYCODONE: NEGATIVE ng/mL (ref <100)
PHENCYCLIDINE: NEGATIVE ng/mL (ref <25)
Please note:: 0
pH: 6.9 (ref 4.5–9.0)

## 2016-04-12 NOTE — L&D Delivery Note (Signed)
Delivery Note After cytotec x 3 doses and a foley bulb, pt progressed to complete at 2046, SROMed, pushed with a few contractions and at 8:56 PM a viable female was delivered via Vaginal, Spontaneous Delivery (Presentation: ROA).  APGAR: 8, 9; weight: pending.  Infant dried and placed on pt's abd. Cord clamped and cut by FOB. Hospital cord blood sample collected. Placenta status: spont , intact .  Cord: 3 vessel  Anesthesia:  1% lidocaine Episiotomy: None Lacerations: 2nd degree;Perineal Suture Repair: 3.0 monocryl Est. Blood Loss (mL): 200  Mom to postpartum.  Baby to Couplet care / Skin to Skin.  [name], KIMBERLY CNM 10/11/2016, 9:20 PM

## 2016-04-13 ENCOUNTER — Ambulatory Visit (HOSPITAL_COMMUNITY)
Admission: RE | Admit: 2016-04-13 | Discharge: 2016-04-13 | Disposition: A | Discharge status: Home / Self Care | Payer: Medicaid Other | Source: Ambulatory Visit | Attending: Family | Admitting: Family

## 2016-04-13 ENCOUNTER — Encounter (HOSPITAL_COMMUNITY): Payer: Self-pay

## 2016-04-13 DIAGNOSIS — O99211 Obesity complicating pregnancy, first trimester: Secondary | ICD-10-CM | POA: Diagnosis not present

## 2016-04-13 DIAGNOSIS — Z3400 Encounter for supervision of normal first pregnancy, unspecified trimester: Secondary | ICD-10-CM

## 2016-04-13 DIAGNOSIS — Z3682 Encounter for antenatal screening for nuchal translucency: Secondary | ICD-10-CM | POA: Diagnosis not present

## 2016-04-13 DIAGNOSIS — O3481 Maternal care for other abnormalities of pelvic organs, first trimester: Secondary | ICD-10-CM | POA: Diagnosis not present

## 2016-04-13 DIAGNOSIS — Z3A13 13 weeks gestation of pregnancy: Secondary | ICD-10-CM | POA: Insufficient documentation

## 2016-04-19 ENCOUNTER — Other Ambulatory Visit: Payer: Self-pay | Admitting: Family

## 2016-04-22 ENCOUNTER — Encounter: Payer: Self-pay | Admitting: Obstetrics and Gynecology

## 2016-04-22 ENCOUNTER — Ambulatory Visit (INDEPENDENT_AMBULATORY_CARE_PROVIDER_SITE_OTHER): Payer: Medicaid Other | Admitting: Obstetrics and Gynecology

## 2016-04-22 ENCOUNTER — Ambulatory Visit (INDEPENDENT_AMBULATORY_CARE_PROVIDER_SITE_OTHER): Payer: Medicaid Other | Admitting: Clinical

## 2016-04-22 VITALS — BP 121/82 | HR 84 | Wt 275.0 lb

## 2016-04-22 DIAGNOSIS — N760 Acute vaginitis: Secondary | ICD-10-CM

## 2016-04-22 DIAGNOSIS — B373 Candidiasis of vulva and vagina: Secondary | ICD-10-CM | POA: Diagnosis not present

## 2016-04-22 DIAGNOSIS — O99212 Obesity complicating pregnancy, second trimester: Secondary | ICD-10-CM | POA: Diagnosis not present

## 2016-04-22 DIAGNOSIS — B3731 Acute candidiasis of vulva and vagina: Secondary | ICD-10-CM

## 2016-04-22 DIAGNOSIS — O98812 Other maternal infectious and parasitic diseases complicating pregnancy, second trimester: Secondary | ICD-10-CM

## 2016-04-22 DIAGNOSIS — Z8619 Personal history of other infectious and parasitic diseases: Secondary | ICD-10-CM

## 2016-04-22 DIAGNOSIS — E669 Obesity, unspecified: Secondary | ICD-10-CM | POA: Diagnosis not present

## 2016-04-22 DIAGNOSIS — O99342 Other mental disorders complicating pregnancy, second trimester: Secondary | ICD-10-CM

## 2016-04-22 DIAGNOSIS — N83209 Unspecified ovarian cyst, unspecified side: Secondary | ICD-10-CM

## 2016-04-22 DIAGNOSIS — O99340 Other mental disorders complicating pregnancy, unspecified trimester: Secondary | ICD-10-CM

## 2016-04-22 DIAGNOSIS — B9689 Other specified bacterial agents as the cause of diseases classified elsewhere: Secondary | ICD-10-CM

## 2016-04-22 DIAGNOSIS — F339 Major depressive disorder, recurrent, unspecified: Secondary | ICD-10-CM | POA: Insufficient documentation

## 2016-04-22 DIAGNOSIS — Z1389 Encounter for screening for other disorder: Secondary | ICD-10-CM

## 2016-04-22 DIAGNOSIS — E66813 Obesity, class 3: Secondary | ICD-10-CM | POA: Insufficient documentation

## 2016-04-22 DIAGNOSIS — F329 Major depressive disorder, single episode, unspecified: Secondary | ICD-10-CM

## 2016-04-22 DIAGNOSIS — Z6841 Body Mass Index (BMI) 40.0 and over, adult: Secondary | ICD-10-CM | POA: Insufficient documentation

## 2016-04-22 DIAGNOSIS — N838 Other noninflammatory disorders of ovary, fallopian tube and broad ligament: Secondary | ICD-10-CM | POA: Diagnosis not present

## 2016-04-22 DIAGNOSIS — O3480 Maternal care for other abnormalities of pelvic organs, unspecified trimester: Secondary | ICD-10-CM | POA: Diagnosis not present

## 2016-04-22 DIAGNOSIS — F4323 Adjustment disorder with mixed anxiety and depressed mood: Secondary | ICD-10-CM | POA: Diagnosis not present

## 2016-04-22 LAB — COMPREHENSIVE METABOLIC PANEL
ALBUMIN: 3.6 g/dL (ref 3.6–5.1)
ALT: 8 U/L (ref 6–29)
AST: 9 U/L — ABNORMAL LOW (ref 10–30)
Alkaline Phosphatase: 51 U/L (ref 33–115)
BUN: 8 mg/dL (ref 7–25)
CHLORIDE: 106 mmol/L (ref 98–110)
CO2: 17 mmol/L — AB (ref 20–31)
CREATININE: 0.68 mg/dL (ref 0.50–1.10)
Calcium: 9.2 mg/dL (ref 8.6–10.2)
Glucose, Bld: 102 mg/dL — ABNORMAL HIGH (ref 65–99)
Potassium: 3.8 mmol/L (ref 3.5–5.3)
SODIUM: 136 mmol/L (ref 135–146)
Total Bilirubin: 0.3 mg/dL (ref 0.2–1.2)
Total Protein: 6.5 g/dL (ref 6.1–8.1)

## 2016-04-22 LAB — TSH: TSH: 1.17 m[IU]/L

## 2016-04-22 MED ORDER — METRONIDAZOLE 500 MG PO TABS: 500.0000 mg | ORAL_TABLET | Freq: Three times a day (TID) | ORAL | 0 refills | Status: DC

## 2016-04-22 MED ORDER — MICONAZOLE NITRATE 2 % VA CREA: 1.0000 | TOPICAL_CREAM | Freq: Every day | VAGINAL | 0 refills | Status: DC

## 2016-04-22 NOTE — Progress Notes (Signed)
Prenatal Visit Note Date: 04/22/2016 Clinic: [facility]  Subjective:  Christine Trujillo is a 21 y.o. G1P0 at 24w3dbeing seen today for ongoing prenatal care.  She is currently monitored for the following issues for this high-risk pregnancy and has Paratubal cyst; Vaginal trichomoniasis in Pregnancy; Supervision of high risk pregnancy in second trimester; Ovarian cyst affecting pregnancy, antepartum; BMI 45.0-49.9, adult ([place]; and Obesity affecting pregnancy in second trimester on her problem list.  Patient reports vaginal discharge (+smell) similar to prior yeast and BV. No VB, LOF Contractions: Not present. Vag. Bleeding: None.  Movement: Absent. Denies leaking of fluid.   The following portions of the patient's history were reviewed and updated as appropriate: allergies, current medications, past family history, past medical history, past social history, past surgical history and problem list. Problem list updated.  Objective:   Vitals:   04/22/16 1327  BP: 121/82  Pulse: 84  Weight: 275 lb (124.7 kg)    Fetal Status: Fetal Heart Rate (bpm): 140s   Movement: Absent     General:  Alert, oriented and cooperative. Patient is in no acute distress.  Skin: Skin is warm and dry. No rash noted.   Cardiovascular: Normal heart rate noted  Respiratory: Normal respiratory effort, no problems with respiration noted  Abdomen: Soft, gravid, appropriate for gestational age. Pain/Pressure: Absent     Pelvic:  EGBUS with mild b/l erythema on l. [place] and white cottage cheese like d/c. Erythema in the vault and d/c c/w BV. cx visually closed. No VB or blood in vault. cx nttp  Extremities: Normal range of motion.  Edema: None  Mental Status: Normal mood and affect. Normal behavior. Normal judgment and thought content.   Urinalysis:      Assessment and Plan:  Pregnancy: G1P0 at 145w3d1. Encounter for routine screening for malformation using ultrasonics Routine care. Pt  left prior to labs last visit due to fear of needles. Counseled on need for labs. Neg 1st trimester screen. Offer AFP nv. Anatomy u/s scheduled. - [place] OB COMP + 14 WK; Future - Prenatal Profile - Hemoglobin A1c - Protein / Creatinine Ratio, Urine - Comp Met (CMET) - Hemoglobinopathy evaluation - Cystic fibrosis diagnostic study - TSH  2. History of trichomoniasis TOC today - WET PREP FOR TRMartintonYEAST, CLUE  3. BMI 45.0-49.9, adult (HCGiselaBaseline pre-eclampsia labs today and TSH, a1c  4. Obesity affecting pregnancy in second trimester See above  5. Vulvovaginal candidiasis Monistat 7 and flagyl  6. Depression +screen today. Pt okay with seeing Christine Trujillo  7. Paratubal cyst Still simple appearing and only 10cm at NT scan, which is about half the size it was before. No s/s. Continue to follow  Preterm labor symptoms and general obstetric precautions including but not limited to vaginal bleeding, contractions, leaking of fluid and fetal movement were reviewed in detail with the patient. Please refer to After Visit Summary for other counseling recommendations.  Return in about 2 weeks (around 05/06/2016) for rob. pt would like to go to [facility] office.   ChAletha HalimMD

## 2016-04-22 NOTE — Progress Notes (Signed)
Anatomy Scan scheduled for 2/13 at 0900.

## 2016-04-22 NOTE — BH Specialist Note (Signed)
Session Start time: 2:00  End Time: 2:29 Total Time:  29 minutes Type of Service: Behavioral Health - Individual/Family Interpreter: No.   Interpreter Name & Language: n/a # [facility]  SUBJECTIVE: Christine Trujillo is a 21 y.o. female  Pt. was referred by Dr. Vergie LivingPickens for:  anxiety and depression. Pt. reports the following symptoms/concerns: Pt states that her primary concern is feeling easily irritated, and feeling anxious over blood draw today; lack of interest, low energy, sleep difficulty, lack of appetite (early pregnancy only).  Pt open to strategy to cope with anxiety today. Duration of problem:  Undetermined number of years Severity: moderate Previous treatment: [facility] "at least 4 times", did not wish to discuss today  OBJECTIVE: Mood: Anxious & Affect: Appropriate Risk of harm to self or others: No known risk to self or others. No SI today, no HI today, unknown history at [facility] Assessments administered: PHQ9: 15/ GAD7: 12  LIFE CONTEXT:  Family & Social: Lives with FOB and his four children  School/ Work: Undetermined Self-Care: Undetermined Life changes: Current pregnancy What is important to pt/family (values): Healthy baby  GOALS ADDRESSED:  -Reduce symptoms of anxiety and depression  INTERVENTIONS: Motivational Interviewing and Meditation: CALM relaxation breathing exercise   ASSESSMENT:  Pt currently experiencing Adjustment disorder with mixed anxious and depressed mood.  Pt may benefit from psychoeducation and brief therapeutic intervention regarding coping with symptoms of anxiety and depression.   PLAN: 1. F/U with behavioral health clinician: Two weeks 2. Behavioral Health meds: none 3. Behavioral recommendations:  -Consider discussion with medical provider about BH meds, at next medical appointment -Practice CALM relaxation breathing exercise today; consider practicing daily -Read educational  material regarding coping with anxiety and depression 4. Referral: Brief Counseling/Psychotherapy and Psychoeducation 5. From scale of 1-10, how likely are you to follow plan: 6  Gaynell FaceJamie C Mcmannes LCSWA Behavioral Health Clinician  Warmhandoff:   Warm Hand Off Completed.        Dep[facility] screen [facility] 04/22/2016 03/25/2016  Decreased Interest 3 2  Down, Depressed, Hopeless 2 0  PHQ - 2 Score 5 2  Altered sleeping 3 3  Tired, decreased energy 3 3  Change in appetite 2 2  Feeling bad or failure about yourself  0 0  Trouble concentrating 2 0  Moving slowly or fidgety/restless 0 0  Suicidal thoughts 0 0  PHQ-9 Score 15 10   GAD 7 : Generalized Anxiety Score 04/22/2016  Nervous, Anxious, on Edge 2  Control/stop worrying 2  Worry too much - different things 2  Trouble relaxing 2  Restless 0  Easily annoyed or irritable 3  Afraid - awful might happen 1  Total GAD 7 Score 12

## 2016-04-22 NOTE — Addendum Note (Signed)
Addended by: Sherre LainASH, Melvina Pangelinan A on: 04/22/2016 02:33 PM   Modules accepted: Orders

## 2016-04-23 LAB — PROTEIN / CREATININE RATIO, URINE
CREATININE, URINE: 395 mg/dL — AB (ref 20–320)
Protein Creatinine Ratio: 48 mg/g creat (ref 21–161)
Total Protein, Urine: 19 mg/dL (ref 5–24)

## 2016-04-23 LAB — WET PREP, GENITAL
TRICH WET PREP: NONE SEEN
Yeast Wet Prep HPF POC: NONE SEEN

## 2016-04-23 LAB — HEMOGLOBIN A1C
HEMOGLOBIN A1C: 5 % (ref <5.7)
Mean Plasma Glucose: 97 mg/dL

## 2016-04-24 LAB — PRENATAL PROFILE (SOLSTAS)
Antibody Screen: NEGATIVE
BASOS ABS: 0 {cells}/uL (ref 0–200)
BASOS PCT: 0 %
EOS ABS: 99 {cells}/uL (ref 15–500)
Eosinophils Relative: 1 %
HCT: 32.8 % — ABNORMAL LOW (ref 35.0–45.0)
HIV: NONREACTIVE
Hemoglobin: 10.7 g/dL — ABNORMAL LOW (ref 11.7–15.5)
Hepatitis B Surface Ag: NEGATIVE
LYMPHS PCT: 22 %
Lymphs Abs: 2178 cells/uL (ref 850–3900)
MCH: 26 pg — ABNORMAL LOW (ref 27.0–33.0)
MCHC: 32.6 g/dL (ref 32.0–36.0)
MCV: 79.6 fL — AB (ref 80.0–100.0)
MONO ABS: 792 {cells}/uL (ref 200–950)
MPV: 9.5 fL (ref 7.5–12.5)
Monocytes Relative: 8 %
NEUTROS PCT: 69 %
Neutro Abs: 6831 cells/uL (ref 1500–7800)
PLATELETS: 337 10*3/uL (ref 140–400)
RBC: 4.12 MIL/uL (ref 3.80–5.10)
RDW: 15.2 % — AB (ref 11.0–15.0)
RH TYPE: POSITIVE
RUBELLA: 2.6 {index} — AB (ref <0.90)
WBC: 9.9 10*3/uL (ref 3.8–10.8)

## 2016-04-27 LAB — CYSTIC FIBROSIS DIAGNOSTIC STUDY

## 2016-05-10 ENCOUNTER — Ambulatory Visit (INDEPENDENT_AMBULATORY_CARE_PROVIDER_SITE_OTHER): Payer: Medicaid Other | Admitting: Family Medicine

## 2016-05-10 VITALS — BP 117/60 | HR 90 | Wt 277.0 lb

## 2016-05-10 DIAGNOSIS — O0992 Supervision of high risk pregnancy, unspecified, second trimester: Secondary | ICD-10-CM | POA: Diagnosis not present

## 2016-05-10 DIAGNOSIS — K5901 Slow transit constipation: Secondary | ICD-10-CM

## 2016-05-10 MED ORDER — POLYETHYLENE GLYCOL 3350 17 G PO PACK: 17.0000 g | PACK | Freq: Every day | ORAL | 0 refills | Status: DC

## 2016-05-10 MED ORDER — CYCLOBENZAPRINE HCL 10 MG PO TABS: 10.0000 mg | ORAL_TABLET | Freq: Three times a day (TID) | ORAL | 1 refills | Status: DC | PRN

## 2016-05-10 NOTE — Progress Notes (Signed)
Pt states left sided leg/hip pain.  Pt states she is having constipation. Pt states having hemorrhoids.  Pt advised to take stool softener or Miralax.  Pt would like to have Rx sent to pharmacy. Pt states that she is having episodes of urinating on herself.   Pt would like to make aware that she does not want antibiotics. Pt states she has some vaginal burning with Monistat.

## 2016-05-10 NOTE — Progress Notes (Signed)
Dictation #1 [id number]  [id number]   PRENATAL VISIT NOTE  Subjective:  Christine Trujillo is a 21 y.o. G1P0 at 3573w0d being seen today for ongoing prenatal care.  She is currently monitored for the following issues for this low-risk pregnancy and has Vaginal trichomoniasis in Pregnancy; Supervision of high risk pregnancy in second trimester; Ovarian cyst affecting pregnancy, antepartum; BMI 45.0-49.9, adult (HCC); Obesity affecting pregnancy in second trimester; and Depression on her problem list.  Patient reports no complaints.  Contractions: Not present. Vag. Bleeding: None.  Movement: Present. Denies leaking of fluid.   The following portions of the patient's history were reviewed and updated as appropriate: allergies, current medications, past family history, past medical history, past social history, past surgical history and problem list. Problem list updated.  Objective:   Vitals:   05/10/16 1005  BP: 117/60  Pulse: 90  Weight: 277 lb (125.6 kg)    Fetal Status: Fetal Heart Rate (bpm): 147   Movement: Present     General:  Alert, oriented and cooperative. Patient is in no acute distress.  Skin: Skin is warm and dry. No rash noted.   Cardiovascular: Normal heart rate noted  Respiratory: Normal respiratory effort, no problems with respiration noted  Abdomen: Soft, gravid, appropriate for gestational age. Pain/Pressure: Present     Pelvic:  Cervical exam deferred        Extremities: Normal range of motion.     Mental Status: Normal mood and affect. Normal behavior. Normal judgment and thought content.   Assessment and Plan:  Pregnancy: G1P0 at 3273w0d  1. Supervision of high risk pregnancy in second trimester Normal first screen AFP today Anatomy U/s scheduled - AFP, Serum, Open Spina Bifida  2. Slow transit constipation Trial of Miralax - polyethylene glycol (MIRALAX) packet; Take 17 g by mouth daily.  Dispense: 14 each; Refill: 0  3. Large ovarian cyst To f/u  at anatomy scan--if continuing to get bigger, consider surgical drainage/removal.  General obstetric precautions including but not limited to vaginal bleeding, contractions, leaking of fluid and fetal movement were reviewed in detail with the patient. Please refer to After Visit Summary for other counseling recommendations.  Return in 4 weeks (on 06/07/2016).   Reva Boresanya S Pratt, MD

## 2016-05-10 NOTE — Patient Instructions (Addendum)
Second Trimester of Pregnancy The second trimester is from week 13 through week 28 (months 4 through 6). The second trimester is often a time when you feel your best. Your body has also adjusted to being pregnant, and you begin to feel better physically. Usually, morning sickness has lessened or quit completely, you may have more energy, and you may have an increase in appetite. The second trimester is also a time when the fetus is growing rapidly. At the end of the sixth month, the fetus is about 9 inches long and weighs about 1 pounds. You will likely begin to feel the baby move (quickening) between 18 and 20 weeks of the pregnancy. Body changes during your second trimester Your body continues to go through many changes during your second trimester. The changes vary from woman to woman.  Your weight will continue to increase. You will notice your lower abdomen bulging out.  You may begin to get stretch marks on your hips, abdomen, and breasts.  You may develop headaches that can be relieved by medicines. The medicines should be approved by your health care provider.  You may urinate more often because the fetus is pressing on your bladder.  You may develop or continue to have heartburn as a result of your pregnancy.  You may develop constipation because certain hormones are causing the muscles that push waste through your intestines to slow down.  You may develop hemorrhoids or swollen, bulging veins (varicose veins).  You may have back pain. This is caused by:  Weight gain.  Pregnancy hormones that are relaxing the joints in your pelvis.  A shift in weight and the muscles that support your balance.  Your breasts will continue to grow and they will continue to become tender.  Your gums may bleed and may be sensitive to brushing and flossing.  Dark spots or blotches (chloasma, mask of pregnancy) may develop on your face. This will likely fade after the baby is born.  A dark line  from your belly button to the pubic area (linea nigra) may appear. This will likely fade after the baby is born.  You may have changes in your hair. These can include thickening of your hair, rapid growth, and changes in texture. Some women also have hair loss during or after pregnancy, or hair that feels dry or thin. Your hair will most likely return to normal after your baby is born. What to expect at prenatal visits During a routine prenatal visit:  You will be weighed to make sure you and the fetus are growing normally.  Your blood pressure will be taken.  Your abdomen will be measured to track your baby's growth.  The fetal heartbeat will be listened to.  Any test results from the previous visit will be discussed. Your health care provider may ask you:  How you are feeling.  If you are feeling the baby move.  If you have had any abnormal symptoms, such as leaking fluid, bleeding, severe headaches, or abdominal cramping.  If you are using any tobacco products, including cigarettes, chewing tobacco, and electronic cigarettes.  If you have any questions. Other tests that may be performed during your second trimester include:  Blood tests that check for:  Low iron levels (anemia).  Gestational diabetes (between 24 and 28 weeks).  Rh antibodies. This is to check for a protein on red blood cells (Rh factor).  Urine tests to check for infections, diabetes, or protein in the urine.  An ultrasound to   confirm the proper growth and development of the baby.  An amniocentesis to check for possible genetic problems.  Fetal screens for spina bifida and Down syndrome.  HIV (human immunodeficiency virus) testing. Routine prenatal testing includes screening for HIV, unless you choose not to have this test. Follow these instructions at home: Eating and drinking  Continue to eat regular, healthy meals.  Avoid raw meat, uncooked cheese, cat litter boxes, and soil used by cats. These  carry germs that can cause birth defects in the baby.  Take your prenatal vitamins.  Take 1500-2000 mg of calcium daily starting at the 20th week of pregnancy until you deliver your baby.  If you develop constipation:  Take over-the-counter or prescription medicines.  Drink enough fluid to keep your urine clear or pale yellow.  Eat foods that are high in fiber, such as fresh fruits and vegetables, whole grains, and beans.  Limit foods that are high in fat and processed sugars, such as fried and sweet foods. Activity  Exercise only as directed by your health care provider. Experiencing uterine cramps is a good sign to stop exercising.  Avoid heavy lifting, wear low heel shoes, and practice good posture.  Wear your seat belt at all times when driving.  Rest with your legs elevated if you have leg cramps or low back pain.  Wear a good support bra for breast tenderness.  Do not use hot tubs, steam rooms, or saunas. Lifestyle  Avoid all smoking, herbs, alcohol, and unprescribed drugs. These chemicals affect the formation and growth of the baby.  Do not use any products that contain nicotine or tobacco, such as cigarettes and e-cigarettes. If you need help quitting, ask your health care provider.  A sexual relationship may be continued unless your health care provider directs you otherwise. General instructions  Follow your health care provider's instructions regarding medicine use. There are medicines that are either safe or unsafe to take during pregnancy.  Take warm sitz baths to soothe any pain or discomfort caused by hemorrhoids. Use hemorrhoid cream if your health care provider approves.  If you develop varicose veins, wear support hose. Elevate your feet for 15 minutes, 3-4 times a day. Limit salt in your diet.  Visit your dentist if you have not gone yet during your pregnancy. Use a soft toothbrush to brush your teeth and be gentle when you floss.  Keep all follow-up  prenatal visits as told by your health care provider. This is important. Contact a health care provider if:  You have dizziness.  You have mild pelvic cramps, pelvic pressure, or nagging pain in the abdominal area.  You have persistent nausea, vomiting, or diarrhea.  You have a bad smelling vaginal discharge.  You have pain with urination. Get help right away if:  You have a fever.  You are leaking fluid from your vagina.  You have spotting or bleeding from your vagina.  You have severe abdominal cramping or pain.  You have rapid weight gain or weight loss.  You have shortness of breath with chest pain.  You notice sudden or extreme swelling of your face, hands, ankles, feet, or legs.  You have not felt your baby move in over an hour.  You have severe headaches that do not go away with medicine.  You have vision changes. Summary  The second trimester is from week 13 through week 28 (months 4 through 6). It is also a time when the fetus is growing rapidly.  Your body goes   through many changes during pregnancy. The changes vary from woman to woman.  Avoid all smoking, herbs, alcohol, and unprescribed drugs. These chemicals affect the formation and growth your baby.  Do not use any tobacco products, such as cigarettes, chewing tobacco, and e-cigarettes. If you need help quitting, ask your health care provider.  Contact your health care provider if you have any questions. Keep all prenatal visits as told by your health care provider. This is important. This information is not intended to replace advice given to you by your health care provider. Make sure you discuss any questions you have with your health care provider. Document Released: 03/23/2001 Document Revised: 09/04/2015 Document Reviewed: 05/30/2012 [employer] Interactive Patient Education  2017 [employer]   Breastfeeding Deciding to breastfeed is one of the best choices you can make for you and your baby. A  change in hormones during pregnancy causes your breast tissue to grow and increases the number and size of your milk ducts. These hormones also allow proteins, sugars, and fats from your blood supply to make breast milk in your milk-producing glands. Hormones prevent breast milk from being released before your baby is born as well as prompt milk flow after birth. Once breastfeeding has begun, thoughts of your baby, as well as his or her sucking or crying, can stimulate the release of milk from your milk-producing glands. Benefits of breastfeeding For Your Baby  Your first milk (colostrum) helps your baby's digestive system function better.  There are antibodies in your milk that help your baby fight off infections.  Your baby has a lower incidence of asthma, allergies, and sudden infant death syndrome.  The nutrients in breast milk are better for your baby than infant formulas and are designed uniquely for your baby's needs.  Breast milk improves your baby's brain development.  Your baby is less likely to develop other conditions, such as childhood obesity, asthma, or type 2 diabetes mellitus. For You  Breastfeeding helps to create a very special bond between you and your baby.  Breastfeeding is convenient. Breast milk is always available at the correct temperature and costs nothing.  Breastfeeding helps to burn calories and helps you lose the weight gained during pregnancy.  Breastfeeding makes your uterus contract to its prepregnancy size faster and slows bleeding (lochia) after you give birth.  Breastfeeding helps to lower your risk of developing type 2 diabetes mellitus, osteoporosis, and breast or ovarian cancer later in life. Signs that your baby is hungry Early Signs of Hunger  Increased alertness or activity.  Stretching.  Movement of the head from side to side.  Movement of the head and opening of the mouth when the corner of the mouth or cheek is stroked  (rooting).  Increased sucking sounds, smacking lips, cooing, sighing, or squeaking.  Hand-to-mouth movements.  Increased sucking of fingers or hands. Late Signs of Hunger  Fussing.  Intermittent crying. Extreme Signs of Hunger  Signs of extreme hunger will require calming and consoling before your baby will be able to breastfeed successfully. Do not wait for the following signs of extreme hunger to occur before you initiate breastfeeding:  Restlessness.  A loud, strong cry.  Screaming. Breastfeeding basics  Breastfeeding Initiation  Find a comfortable place to sit or lie down, with your neck and back well supported.  Place a pillow or rolled up blanket under your baby to bring him or her to the level of your breast (if you are seated). Nursing pillows are specially designed to help   support your arms and your baby while you breastfeed.  Make sure that your baby's abdomen is facing your abdomen.  Gently massage your breast. With your fingertips, massage from your chest wall toward your nipple in a circular motion. This encourages milk flow. You may need to continue this action during the feeding if your milk flows slowly.  Support your breast with 4 fingers underneath and your thumb above your nipple. Make sure your fingers are well away from your nipple and your baby's mouth.  Stroke your baby's lips gently with your finger or nipple.  When your baby's mouth is open wide enough, quickly bring your baby to your breast, placing your entire nipple and as much of the colored area around your nipple (areola) as possible into your baby's mouth.  More areola should be visible above your baby's upper lip than below the lower lip.  Your baby's tongue should be between his or her lower gum and your breast.  Ensure that your baby's mouth is correctly positioned around your nipple (latched). Your baby's lips should create a seal on your breast and be turned out (everted).  It is common  for your baby to suck about 2-3 minutes in order to start the flow of breast milk. Latching  Teaching your baby how to latch on to your breast properly is very important. An improper latch can cause nipple pain and decreased milk supply for you and poor weight gain in your baby. Also, if your baby is not latched onto your nipple properly, he or she may swallow some air during feeding. This can make your baby fussy. Burping your baby when you switch breasts during the feeding can help to get rid of the air. However, teaching your baby to latch on properly is still the best way to prevent fussiness from swallowing air while breastfeeding. Signs that your baby has successfully latched on to your nipple:  Silent tugging or silent sucking, without causing you pain.  Swallowing heard between every 3-4 sucks.  Muscle movement above and in front of his or her ears while sucking. Signs that your baby has not successfully latched on to nipple:  Sucking sounds or smacking sounds from your baby while breastfeeding.  Nipple pain. If you think your baby has not latched on correctly, slip your finger into the corner of your baby's mouth to break the suction and place it between your baby's gums. Attempt breastfeeding initiation again. Signs of Successful Breastfeeding  Signs from your baby:  A gradual decrease in the number of sucks or complete cessation of sucking.  Falling asleep.  Relaxation of his or her body.  Retention of a small amount of milk in his or her mouth.  Letting go of your breast by himself or herself. Signs from you:  Breasts that have increased in firmness, weight, and size 1-3 hours after feeding.  Breasts that are softer immediately after breastfeeding.  Increased milk volume, as well as a change in milk consistency and color by the fifth day of breastfeeding.  Nipples that are not sore, cracked, or bleeding. Signs That Your Baby is Getting Enough Milk  Wetting at least  1-2 diapers during the first 24 hours after birth.  Wetting at least 5-6 diapers every 24 hours for the first week after birth. The urine should be clear or pale yellow by 5 days after birth.  Wetting 6-8 diapers every 24 hours as your baby continues to grow and develop.  At least 3 stools in   a 24-hour period by age 5 days. The stool should be soft and yellow.  At least 3 stools in a 24-hour period by age 7 days. The stool should be seedy and yellow.  No loss of weight greater than 10% of birth weight during the first 3 days of age.  Average weight gain of 4-7 ounces (113-198 g) per week after age 4 days.  Consistent daily weight gain by age 5 days, without weight loss after the age of 2 weeks. After a feeding, your baby may spit up a small amount. This is common. Breastfeeding frequency and duration Frequent feeding will help you make more milk and can prevent sore nipples and breast engorgement. Breastfeed when you feel the need to reduce the fullness of your breasts or when your baby shows signs of hunger. This is called "breastfeeding on demand." Avoid introducing a pacifier to your baby while you are working to establish breastfeeding (the first 4-6 weeks after your baby is born). After this time you may choose to use a pacifier. Research has shown that pacifier use during the first year of a baby's life decreases the risk of sudden infant death syndrome (SIDS). Allow your baby to feed on each breast as long as he or she wants. Breastfeed until your baby is finished feeding. When your baby unlatches or falls asleep while feeding from the first breast, offer the second breast. Because newborns are often sleepy in the first few weeks of life, you may need to awaken your baby to get him or her to feed. Breastfeeding times will vary from baby to baby. However, the following rules can serve as a guide to help you ensure that your baby is properly fed:  Newborns (babies 4 weeks of age or younger)  may breastfeed every 1-3 hours.  Newborns should not go longer than 3 hours during the day or 5 hours during the night without breastfeeding.  You should breastfeed your baby a minimum of 8 times in a 24-hour period until you begin to introduce solid foods to your baby at around 6 months of age. Breast milk pumping Pumping and storing breast milk allows you to ensure that your baby is exclusively fed your breast milk, even at times when you are unable to breastfeed. This is especially important if you are going back to work while you are still breastfeeding or when you are not able to be present during feedings. Your lactation consultant can give you guidelines on how long it is safe to store breast milk. A breast pump is a machine that allows you to pump milk from your breast into a sterile bottle. The pumped breast milk can then be stored in a refrigerator or freezer. Some breast pumps are operated by hand, while others use electricity. Ask your lactation consultant which type will work best for you. Breast pumps can be purchased, but some hospitals and breastfeeding support groups lease breast pumps on a monthly basis. A lactation consultant can teach you how to hand express breast milk, if you prefer not to use a pump. Caring for your breasts while you breastfeed Nipples can become dry, cracked, and sore while breastfeeding. The following recommendations can help keep your breasts moisturized and healthy:  Avoid using soap on your nipples.  Wear a supportive bra. Although not required, special nursing bras and tank tops are designed to allow access to your breasts for breastfeeding without taking off your entire bra or top. Avoid wearing underwire-style bras or extremely tight   bras.  Air dry your nipples for 3-4minutes after each feeding.  Use only cotton bra pads to absorb leaked breast milk. Leaking of breast milk between feedings is normal.  Use lanolin on your nipples after breastfeeding.  Lanolin helps to maintain your skin's normal moisture barrier. If you use pure lanolin, you do not need to wash it off before feeding your baby again. Pure lanolin is not toxic to your baby. You may also hand express a few drops of breast milk and gently massage that milk into your nipples and allow the milk to air dry. In the first few weeks after giving birth, some women experience extremely full breasts (engorgement). Engorgement can make your breasts feel heavy, warm, and tender to the touch. Engorgement peaks within 3-5 days after you give birth. The following recommendations can help ease engorgement:  Completely empty your breasts while breastfeeding or pumping. You may want to start by applying warm, moist heat (in the shower or with warm water-soaked hand towels) just before feeding or pumping. This increases circulation and helps the milk flow. If your baby does not completely empty your breasts while breastfeeding, pump any extra milk after he or she is finished.  Wear a snug bra (nursing or regular) or tank top for 1-2 days to signal your body to slightly decrease milk production.  Apply ice packs to your breasts, unless this is too uncomfortable for you.  Make sure that your baby is latched on and positioned properly while breastfeeding. If engorgement persists after 48 hours of following these recommendations, contact your health care provider or a lactation consultant. Overall health care recommendations while breastfeeding  Eat healthy foods. Alternate between meals and snacks, eating 3 of each per day. Because what you eat affects your breast milk, some of the foods may make your baby more irritable than usual. Avoid eating these foods if you are sure that they are negatively affecting your baby.  Drink milk, fruit juice, and water to satisfy your thirst (about 10 glasses a day).  Rest often, relax, and continue to take your prenatal vitamins to prevent fatigue, stress, and  anemia.  Continue breast self-awareness checks.  Avoid chewing and smoking tobacco. Chemicals from cigarettes that pass into breast milk and exposure to secondhand smoke may harm your baby.  Avoid alcohol and drug use, including marijuana. Some medicines that may be harmful to your baby can pass through breast milk. It is important to ask your health care provider before taking any medicine, including all over-the-counter and prescription medicine as well as vitamin and herbal supplements. It is possible to become pregnant while breastfeeding. If birth control is desired, ask your health care provider about options that will be safe for your baby. Contact a health care provider if:  You feel like you want to stop breastfeeding or have become frustrated with breastfeeding.  You have painful breasts or nipples.  Your nipples are cracked or bleeding.  Your breasts are red, tender, or warm.  You have a swollen area on either breast.  You have a fever or chills.  You have nausea or vomiting.  You have drainage other than breast milk from your nipples.  Your breasts do not become full before feedings by the fifth day after you give birth.  You feel sad and depressed.  Your baby is too sleepy to eat well.  Your baby is having trouble sleeping.  Your baby is wetting less than 3 diapers in a 24-hour period.  Your baby   has less than 3 stools in a 24-hour period.  Your baby's skin or the white part of his or her eyes becomes yellow.  Your baby is not gaining weight by 5 days of age. Get help right away if:  Your baby is overly tired (lethargic) and does not want to wake up and feed.  Your baby develops an unexplained fever. This information is not intended to replace advice given to you by your health care provider. Make sure you discuss any questions you have with your health care provider. Document Released: 03/29/2005 Document Revised: 09/10/2015 Document Reviewed:  09/20/2012 Else[employer] Interactive Patient Education  2017 [employer]  

## 2016-05-16 LAB — AFP, SERUM, OPEN SPINA BIFIDA
AFP MOM: 0.95
AFP VALUE AFPOSL: 25 ng/mL
Gest. Age on Collection Date: 17 weeks
Maternal Age At EDD: 22 years
OSBR RISK 1 IN: 10000
Test Results:: NEGATIVE
Weight: 277 [lb_av]

## 2016-05-20 ENCOUNTER — Encounter (HOSPITAL_COMMUNITY): Payer: Self-pay

## 2016-05-20 ENCOUNTER — Inpatient Hospital Stay (HOSPITAL_COMMUNITY)
Admission: AD | Admit: 2016-05-20 | Discharge: 2016-05-20 | Disposition: A | Discharge status: Home / Self Care | Payer: Medicaid Other | Source: Ambulatory Visit | Attending: Obstetrics and Gynecology | Admitting: Obstetrics and Gynecology

## 2016-05-20 DIAGNOSIS — O99282 Endocrine, nutritional and metabolic diseases complicating pregnancy, second trimester: Secondary | ICD-10-CM | POA: Insufficient documentation

## 2016-05-20 DIAGNOSIS — L219 Seborrheic dermatitis, unspecified: Secondary | ICD-10-CM | POA: Diagnosis not present

## 2016-05-20 DIAGNOSIS — N83209 Unspecified ovarian cyst, unspecified side: Secondary | ICD-10-CM

## 2016-05-20 DIAGNOSIS — E860 Dehydration: Secondary | ICD-10-CM | POA: Insufficient documentation

## 2016-05-20 DIAGNOSIS — Z3A18 18 weeks gestation of pregnancy: Secondary | ICD-10-CM | POA: Diagnosis not present

## 2016-05-20 DIAGNOSIS — O99212 Obesity complicating pregnancy, second trimester: Secondary | ICD-10-CM | POA: Diagnosis not present

## 2016-05-20 DIAGNOSIS — Z79899 Other long term (current) drug therapy: Secondary | ICD-10-CM | POA: Diagnosis not present

## 2016-05-20 DIAGNOSIS — R51 Headache: Secondary | ICD-10-CM | POA: Diagnosis not present

## 2016-05-20 DIAGNOSIS — O26892 Other specified pregnancy related conditions, second trimester: Secondary | ICD-10-CM

## 2016-05-20 DIAGNOSIS — O99712 Diseases of the skin and subcutaneous tissue complicating pregnancy, second trimester: Secondary | ICD-10-CM | POA: Diagnosis not present

## 2016-05-20 DIAGNOSIS — O3480 Maternal care for other abnormalities of pelvic organs, unspecified trimester: Secondary | ICD-10-CM

## 2016-05-20 DIAGNOSIS — O0992 Supervision of high risk pregnancy, unspecified, second trimester: Secondary | ICD-10-CM

## 2016-05-20 HISTORY — DX: Headache, unspecified: R51.9

## 2016-05-20 HISTORY — DX: Migraine, unspecified, not intractable, without status migrainosus: G43.909

## 2016-05-20 HISTORY — DX: Headache: R51

## 2016-05-20 LAB — URINALYSIS, ROUTINE W REFLEX MICROSCOPIC
BILIRUBIN URINE: NEGATIVE
Glucose, UA: NEGATIVE mg/dL
Hgb urine dipstick: NEGATIVE
KETONES UR: 20 mg/dL — AB
Nitrite: NEGATIVE
PROTEIN: NEGATIVE mg/dL
Specific Gravity, Urine: 1.025 (ref 1.005–1.030)
pH: 5 (ref 5.0–8.0)

## 2016-05-20 MED ORDER — CICLOPIROX 1 % EX SHAM: 1.0000 "application " | MEDICATED_SHAMPOO | Freq: Every day | CUTANEOUS | 0 refills | Status: DC

## 2016-05-20 MED ORDER — BUTALBITAL-APAP-CAFFEINE 50-325-40 MG PO TABS: 1.0000 | ORAL_TABLET | Freq: Four times a day (QID) | ORAL | 0 refills | Status: DC | PRN

## 2016-05-20 NOTE — [facility] Note (Addendum)
Pt states she has a headache. She also used some oil spray on her scalp and now her scalp is breaking out. Pt states she has taken up to 20 tylenol in one day not relieving headache. I asked what strength and she stated they were 500mg . States she took 4 this morning at 8am.  I advised she should not take more than 4000 mg in 24 hours.

## 2016-05-20 NOTE — MAU Provider Note (Signed)
History     CSN: [id number]  Arrival date and time: 05/20/16 0907   First Provider Initiated Contact with Patient 05/20/16 1000      Chief Complaint  Patient presents with  . Allergic Reaction  . Headache   G1 @18 .3 weeks here with HA. She describes as pain all over but worse in front. Pain started about 1 week ago. No visual disturbances. No vertigo, nausea or vomiting. She has been using Tylenol up to 20 in one day with no relief. Rates pain 6/10. She reports no water intake today. Had 2 small bottles of Gatorade. She reports having hair braids placed at that time. She also reports using an oil sheen at that time and scalp has become itchy and irritated.    OB History    Gravida Para Term Preterm AB Living   1             SAB TAB Ectopic Multiple Live Births                  Past Medical History:  Diagnosis Date  . Headache   . Migraine   . Obesity   . Paratubal cyst 02/19/2016    Past Surgical History:  Procedure Laterality Date  . WISDOM TOOTH EXTRACTION      Family History  Problem Relation Age of Onset  . Heart disease Mother     Social History  Substance Use Topics  . Smoking status: Never Smoker  . Smokeless tobacco: Never Used  . Alcohol use No    Allergies: No Known Allergies  Prescriptions Prior to Admission  Medication Sig Dispense Refill Last Dose  . acetaminophen (TYLENOL) 500 MG tablet Take 500 mg by mouth every 6 (six) hours as needed for moderate pain.   05/20/2016 at Unknown time  . cyclobenzaprine (FLEXERIL) 10 MG tablet Take 1 tablet (10 mg total) by mouth every 8 (eight) hours as needed for muscle spasms. 30 tablet 1 Past Week at Unknown time  . Prenat w/o A Vit-FeFum-FePo-FA (CONCEPT OB) 130-92.4-1 MG CAPS Take 1 tablet by mouth daily. 30 capsule 12 05/20/2016 at Unknown time  . promethazine (PHENERGAN) 25 MG tablet Take 25 mg by mouth every 6 (six) hours as needed for nausea or vomiting.   Past Month at Unknown time  . polyethylene glycol  (MIRALAX) packet Take 17 g by mouth daily. 14 each 0     Review of Systems  Eyes: Negative for photophobia and visual disturbance.  Gastrointestinal: Negative for abdominal pain.  Genitourinary: Negative for vaginal bleeding.  Neurological: Positive for headaches.   Physical Exam   Blood pressure 122/65, pulse 110, temperature 98.2 F (36.8 C), resp. rate 18, height 5\' 3"  (1.6 m), weight 126.6 kg (279 lb), last menstrual period 01/12/2016.  Physical Exam  Nursing note and vitals reviewed. Constitutional: She is oriented to person, place, and time. She appears well-developed and well-nourished. No distress.  HENT:  Head: Normocephalic and atraumatic.  Neck: Neck supple.  Respiratory: Effort normal.  Musculoskeletal: Normal range of motion.  Neurological: She is alert and oriented to person, place, and time. No cranial nerve deficit. Coordination normal.  Skin: Skin is warm and dry.  Scalp: multiple areas of diffuse scaliness, no erythema  Psychiatric: She has a normal mood and affect.   FHT: 150 bpm Results for orders placed or performed during the hospital encounter of 05/20/16 (from the past 24 hour(s))  Urinalysis, Routine w reflex microscopic     Status: Abnormal  Collection Time: 05/20/16  9:18 AM  Result Value Ref Range   Color, Urine YELLOW YELLOW   APPearance CLOUDY (A) CLEAR   Specific Gravity, Urine 1.025 1.005 - 1.030   pH 5.0 5.0 - 8.0   Glucose, UA NEGATIVE NEGATIVE mg/dL   Hgb urine dipstick NEGATIVE NEGATIVE   Bilirubin Urine NEGATIVE NEGATIVE   Ketones, ur 20 (A) NEGATIVE mg/dL   Protein, ur NEGATIVE NEGATIVE mg/dL   Nitrite NEGATIVE NEGATIVE   Leukocytes, UA LARGE (A) NEGATIVE   RBC / HPF 6-30 0 - 5 RBC/hpf   WBC, UA 6-30 0 - 5 WBC/hpf   Bacteria, UA RARE (A) NONE SEEN   Squamous Epithelial / LPF 6-30 (A) NONE SEEN   Mucous PRESENT    MAU Course  Procedures  MDM Labs ordered and reviewed. HA could be caused by dehydration or pull on hair from  braids, she also has hx of migraines. Recommend removal of braids. Discussed safe dose of Tylenol but not to use while using Fioricet. Stable for discharge home.   Assessment and Plan  18 weeks pregnancy Headache Seborrheic dermatitis Dehydration  Discharge home Rx Fioricet Rx Ciclopirox shampoo Follow up at [facility] as scheduled  Allergies as of 05/20/2016   No Known Allergies     Medication List    STOP taking these medications   acetaminophen 500 MG tablet Commonly known as:  TYLENOL     TAKE these medications   butalbital-acetaminophen-caffeine 50-325-40 MG tablet Commonly known as:  FIORICET, ESGIC Take 1-2 tablets by mouth every 6 (six) hours as needed for headache.   Ciclopirox 1 % shampoo Apply 1 application topically at bedtime.   CONCEPT OB 130-92.4-1 MG Caps Take 1 tablet by mouth daily.   cyclobenzaprine 10 MG tablet Commonly known as:  FLEXERIL Take 1 tablet (10 mg total) by mouth every 8 (eight) hours as needed for muscle spasms.   polyethylene glycol packet Commonly known as:  MIRALAX Take 17 g by mouth daily.   promethazine 25 MG tablet Commonly known as:  PHENERGAN Take 25 mg by mouth every 6 (six) hours as needed for nausea or vomiting.      Donette LarryMelanie Inri Sobieski, CNM 05/20/2016, 10:00 AM

## 2016-05-20 NOTE — Discharge Instructions (Signed)
General Headache Without Cause Introduction A headache is pain or discomfort felt around the head or neck area. There are many causes and types of headaches. In some cases, the cause may not be found. Follow these instructions at home: Managing pain  Take over-the-counter and prescription medicines only as told by your doctor.  Lie down in a dark, quiet room when you have a headache.  If directed, apply ice to the head and neck area:  Put ice in a plastic bag.  Place a towel between your skin and the bag.  Leave the ice on for 20 minutes, 2-3 times per day.  Use a heating pad or hot shower to apply heat to the head and neck area as told by your doctor.  Keep lights dim if bright lights bother you or make your headaches worse. Eating and drinking  Eat meals on a regular schedule.  Lessen how much alcohol you drink.  Lessen how much caffeine you drink, or stop drinking caffeine. General instructions  Keep all follow-up visits as told by your doctor. This is important.  Keep a journal to find out if certain things bring on headaches. For example, write down:  What you eat and drink.  How much sleep you get.  Any change to your diet or medicines.  Relax by getting a massage or doing other relaxing activities.  Lessen stress.  Sit up straight. Do not tighten (tense) your muscles.  Do not use tobacco products. This includes cigarettes, chewing tobacco, or e-cigarettes. If you need help quitting, ask your doctor.  Exercise regularly as told by your doctor.  Get enough sleep. This often means 7-9 hours of sleep. Contact a doctor if:  Your symptoms are not helped by medicine.  You have a headache that feels different than the other headaches.  You feel sick to your stomach (nauseous) or you throw up (vomit).  You have a fever. Get help right away if:  Your headache becomes really bad.  You keep throwing up.  You have a stiff neck.  You have trouble  seeing.  You have trouble speaking.  You have pain in the eye or ear.  Your muscles are weak or you lose muscle control.  You lose your balance or have trouble walking.  You feel like you will pass out (faint) or you pass out.  You have confusion. This information is not intended to replace advice given to you by your health care provider. Make sure you discuss any questions you have with your health care provider. Document Released: 01/06/2008 Document Revised: 09/04/2015 Document Reviewed: 07/22/2014  2017 Elsevier Seborrheic Dermatitis, Adult Seborrheic dermatitis is a skin disease that causes red, scaly patches. It usually occurs on the scalp, and it is often called dandruff. The patches may appear on other parts of the body. Skin patches tend to appear where there are many oil glands in the skin. Areas of the body that are commonly affected include:  Scalp.  Skin folds of the body.  Ears.  Eyebrows.  Neck.  Face.  Armpits.  The bearded area of men's faces. The condition may come and go for no known reason, and it is often long-lasting (chronic). What are the causes? The cause of this condition is not known. What increases the risk? This condition is more likely to develop in people who:  Have certain conditions, such as:  HIV (human immunodeficiency virus).  AIDS (acquired immunodeficiency syndrome).  Parkinson disease.  Mood disorders, such as depression.  Are 4540-22 years old. What are the signs or symptoms? Symptoms of this condition include:  Thick scales on the scalp.  Redness on the face or in the armpits.  Skin that is flaky. The flakes may be white or yellow.  Skin that seems oily or dry but is not helped with moisturizers.  Itching or burning in the affected areas. How is this diagnosed? This condition is diagnosed with a medical history and physical exam. A sample of your skin may be tested (skin biopsy). You may need to see a skin  specialist (dermatologist). How is this treated? There is no cure for this condition, but treatment can help to manage the symptoms. You may get treatment to remove scales, lower the risk of skin infection, and reduce swelling or itching. Treatment may include:  Creams that reduce swelling and irritation (steroids).  Creams that reduce skin yeast.  Medicated shampoo, soaps, moisturizing creams, or ointments.  Medicated moisturizing creams or ointments. Follow these instructions at home:  Apply over-the-counter and prescription medicines only as told by your health care provider.  Use any medicated shampoo, soaps, skin creams, or ointments only as told by your health care provider.  Keep all follow-up visits as told by your health care provider. This is important. Contact a health care provider if:  Your symptoms do not improve with treatment.  Your symptoms get worse.  You have new symptoms. This information is not intended to replace advice given to you by your health care provider. Make sure you discuss any questions you have with your health care provider. Document Released: 03/29/2005 Document Revised: 10/17/2015 Document Reviewed: 07/17/2015 Elsevier Interactive Patient Education  2017 [employer]

## 2016-05-25 ENCOUNTER — Other Ambulatory Visit: Payer: Self-pay | Admitting: Obstetrics and Gynecology

## 2016-05-25 ENCOUNTER — Ambulatory Visit (HOSPITAL_COMMUNITY)
Admission: RE | Admit: 2016-05-25 | Discharge: 2016-05-25 | Disposition: A | Discharge status: Home / Self Care | Payer: Medicaid Other | Source: Ambulatory Visit | Attending: Obstetrics and Gynecology | Admitting: Obstetrics and Gynecology

## 2016-05-25 ENCOUNTER — Other Ambulatory Visit: Payer: Self-pay | Admitting: Certified Nurse Midwife

## 2016-05-25 DIAGNOSIS — O3482 Maternal care for other abnormalities of pelvic organs, second trimester: Secondary | ICD-10-CM | POA: Diagnosis not present

## 2016-05-25 DIAGNOSIS — Z3A19 19 weeks gestation of pregnancy: Secondary | ICD-10-CM | POA: Diagnosis not present

## 2016-05-25 DIAGNOSIS — Z363 Encounter for antenatal screening for malformations: Secondary | ICD-10-CM | POA: Diagnosis not present

## 2016-05-25 DIAGNOSIS — O99212 Obesity complicating pregnancy, second trimester: Secondary | ICD-10-CM

## 2016-05-25 DIAGNOSIS — Z1389 Encounter for screening for other disorder: Secondary | ICD-10-CM

## 2016-05-29 ENCOUNTER — Encounter: Payer: Self-pay | Admitting: Obstetrics and Gynecology

## 2016-06-09 ENCOUNTER — Encounter: Payer: Self-pay | Admitting: Obstetrics & Gynecology

## 2016-06-09 ENCOUNTER — Other Ambulatory Visit: Payer: Self-pay

## 2016-06-09 MED ORDER — CONCEPT OB 130-92.4-1 MG PO CAPS: 1.0000 | ORAL_CAPSULE | Freq: Every day | ORAL | 12 refills | Status: DC

## 2016-06-18 ENCOUNTER — Ambulatory Visit (INDEPENDENT_AMBULATORY_CARE_PROVIDER_SITE_OTHER): Payer: Medicaid Other | Admitting: Family Medicine

## 2016-06-18 VITALS — BP 117/72 | HR 90 | Wt 278.0 lb

## 2016-06-18 DIAGNOSIS — O0992 Supervision of high risk pregnancy, unspecified, second trimester: Secondary | ICD-10-CM

## 2016-06-18 DIAGNOSIS — O99342 Other mental disorders complicating pregnancy, second trimester: Secondary | ICD-10-CM | POA: Diagnosis not present

## 2016-06-18 DIAGNOSIS — O219 Vomiting of pregnancy, unspecified: Secondary | ICD-10-CM

## 2016-06-18 DIAGNOSIS — J301 Allergic rhinitis due to pollen: Secondary | ICD-10-CM | POA: Diagnosis not present

## 2016-06-18 DIAGNOSIS — F329 Major depressive disorder, single episode, unspecified: Secondary | ICD-10-CM

## 2016-06-18 MED ORDER — DOXYLAMINE-PYRIDOXINE 10-10 MG PO TBEC: 1.0000 | DELAYED_RELEASE_TABLET | Freq: Four times a day (QID) | ORAL | 3 refills | Status: DC | PRN

## 2016-06-18 MED ORDER — SERTRALINE HCL 50 MG PO TABS: 50.0000 mg | ORAL_TABLET | Freq: Every day | ORAL | 4 refills | Status: DC

## 2016-06-18 MED ORDER — CETIRIZINE HCL 10 MG PO TABS: 10.0000 mg | ORAL_TABLET | Freq: Every day | ORAL | 2 refills | Status: DC

## 2016-06-18 MED ORDER — MOMETASONE FUROATE 50 MCG/ACT NA SUSP: 2.0000 | Freq: Every day | NASAL | 12 refills | Status: DC

## 2016-06-18 NOTE — Progress Notes (Signed)
   PRENATAL VISIT NOTE  Subjective:  Christine Trujillo is a 21 y.o. G1P0 at 5892w4d being seen today for ongoing prenatal care.  She is currently monitored for the following issues for this low-risk pregnancy and has Vaginal trichomoniasis in Pregnancy; Supervision of high risk pregnancy in second trimester; Right ovarian cyst affecting pregnancy in second trimester, antepartum; BMI 45.0-49.9, adult (HCC); Obesity affecting pregnancy in second trimester; and Depression on her problem list.  Patient reports backache, fatigue, nausea and depression.  Contractions: Not present. Vag. Bleeding: None.  Movement: Present. Denies leaking of fluid.   The following portions of the patient's history were reviewed and updated as appropriate: allergies, current medications, past family history, past medical history, past social history, past surgical history and problem list. Problem list updated.  Objective:   Vitals:   06/18/16 1108  BP: 117/72  Pulse: 90  Weight: 278 lb (126.1 kg)    Fetal Status: Fetal Heart Rate (bpm): 148   Movement: Present     General:  Alert, oriented and cooperative. Patient is in no acute distress.  Skin: Skin is warm and dry. No rash noted.   Cardiovascular: Normal heart rate noted  Respiratory: Normal respiratory effort, no problems with respiration noted  Abdomen: Soft, gravid, appropriate for gestational age. Pain/Pressure: Present     Pelvic:  Cervical exam deferred        Extremities: Normal range of motion.  Edema: None  Mental Status: Normal mood and affect. Normal behavior. Normal judgment and thought content.   Assessment and Plan:  Pregnancy: G1P0 at 1692w4d  1. Supervision of high risk pregnancy in second trimester Needs f/u anatomy - US MFM OB FOLLOW UP; Future  2. Nausea and vomiting of pregnancy, antepartum Phenergan prn--Diclegis as preventive - Doxylamine-Pyridoxine (DICLEGIS) 10-10 MG TBEC; Take 1 tablet by mouth 4 (four) times daily as needed.   Dispense: 100 tablet; Refill: 3  3. Depression during pregnancy in second trimester Begin Zoloft--1/2 tab x 5-7 days then increase to 1 tab daily. Risks, normal onset of action, need for increasing doses discussed. - sertraline (ZOLOFT) 50 MG tablet; Take 1 tablet (50 mg total) by mouth daily.  Dispense: 30 tablet; Refill: 4  4. Acute seasonal allergic rhinitis due to pollen Needs something for allergies. - cetirizine (ZYRTEC) 10 MG tablet; Take 1 tablet (10 mg total) by mouth daily.  Dispense: 30 tablet; Refill: 2 - mometasone (NASONEX) 50 MCG/ACT nasal spray; Place 2 sprays into the nose daily.  Dispense: 17 g; Refill: 12  General obstetric precautions including but not limited to vaginal bleeding, contractions, leaking of fluid and fetal movement were reviewed in detail with the patient. Please refer to After Visit Summary for other counseling recommendations.  Return in 4 weeks (on 07/16/2016).   Reva Boresanya S Chemere Steffler, MD

## 2016-06-18 NOTE — Patient Instructions (Signed)
 Second Trimester of Pregnancy The second trimester is from week 14 through week 27 (months 4 through 6). The second trimester is often a time when you feel your best. Your body has adjusted to being pregnant, and you begin to feel better physically. Usually, morning sickness has lessened or quit completely, you may have more energy, and you may have an increase in appetite. The second trimester is also a time when the fetus is growing rapidly. At the end of the sixth month, the fetus is about 9 inches long and weighs about 1 pounds. You will likely begin to feel the baby move (quickening) between 16 and 20 weeks of pregnancy. Body changes during your second trimester Your body continues to go through many changes during your second trimester. The changes vary from woman to woman.  Your weight will continue to increase. You will notice your lower abdomen bulging out.  You may begin to get stretch marks on your hips, abdomen, and breasts.  You may develop headaches that can be relieved by medicines. The medicines should be approved by your health care provider.  You may urinate more often because the fetus is pressing on your bladder.  You may develop or continue to have heartburn as a result of your pregnancy.  You may develop constipation because certain hormones are causing the muscles that push waste through your intestines to slow down.  You may develop hemorrhoids or swollen, bulging veins (varicose veins).  You may have back pain. This is caused by: ? Weight gain. ? Pregnancy hormones that are relaxing the joints in your pelvis. ? A shift in weight and the muscles that support your balance.  Your breasts will continue to grow and they will continue to become tender.  Your gums may bleed and may be sensitive to brushing and flossing.  Dark spots or blotches (chloasma, mask of pregnancy) may develop on your face. This will likely fade after the baby is born.  A dark line from  your belly button to the pubic area (linea nigra) may appear. This will likely fade after the baby is born.  You may have changes in your hair. These can include thickening of your hair, rapid growth, and changes in texture. Some women also have hair loss during or after pregnancy, or hair that feels dry or thin. Your hair will most likely return to normal after your baby is born.  What to expect at prenatal visits During a routine prenatal visit:  You will be weighed to make sure you and the fetus are growing normally.  Your blood pressure will be taken.  Your abdomen will be measured to track your baby's growth.  The fetal heartbeat will be listened to.  Any test results from the previous visit will be discussed.  Your health care provider may ask you:  How you are feeling.  If you are feeling the baby move.  If you have had any abnormal symptoms, such as leaking fluid, bleeding, severe headaches, or abdominal cramping.  If you are using any tobacco products, including cigarettes, chewing tobacco, and electronic cigarettes.  If you have any questions.  Other tests that may be performed during your second trimester include:  Blood tests that check for: ? Low iron levels (anemia). ? High blood sugar that affects pregnant women (gestational diabetes) between 24 and 28 weeks. ? Rh antibodies. This is to check for a protein on red blood cells (Rh factor).  Urine tests to check for infections, diabetes,   or protein in the urine.  An ultrasound to confirm the proper growth and development of the baby.  An amniocentesis to check for possible genetic problems.  Fetal screens for spina bifida and Down syndrome.  HIV (human immunodeficiency virus) testing. Routine prenatal testing includes screening for HIV, unless you choose not to have this test.  Follow these instructions at home: Medicines  Follow your health care provider's instructions regarding medicine use. Specific  medicines may be either safe or unsafe to take during pregnancy.  Take a prenatal vitamin that contains at least 600 micrograms (mcg) of folic acid.  If you develop constipation, try taking a stool softener if your health care provider approves. Eating and drinking  Eat a balanced diet that includes fresh fruits and vegetables, whole grains, good sources of protein such as meat, eggs, or tofu, and low-fat dairy. Your health care provider will help you determine the amount of weight gain that is right for you.  Avoid raw meat and uncooked cheese. These carry germs that can cause birth defects in the baby.  If you have low calcium intake from food, talk to your health care provider about whether you should take a daily calcium supplement.  Limit foods that are high in fat and processed sugars, such as fried and sweet foods.  To prevent constipation: ? Drink enough fluid to keep your urine clear or pale yellow. ? Eat foods that are high in fiber, such as fresh fruits and vegetables, whole grains, and beans. Activity  Exercise only as directed by your health care provider. Most women can continue their usual exercise routine during pregnancy. Try to exercise for 30 minutes at least 5 days a week. Stop exercising if you experience uterine contractions.  Avoid heavy lifting, wear low heel shoes, and practice good posture.  A sexual relationship may be continued unless your health care provider directs you otherwise. Relieving pain and discomfort  Wear a good support bra to prevent discomfort from breast tenderness.  Take warm sitz baths to soothe any pain or discomfort caused by hemorrhoids. Use hemorrhoid cream if your health care provider approves.  Rest with your legs elevated if you have leg cramps or low back pain.  If you develop varicose veins, wear support hose. Elevate your feet for 15 minutes, 3-4 times a day. Limit salt in your diet. Prenatal Care  Write down your questions.  Take them to your prenatal visits.  Keep all your prenatal visits as told by your health care provider. This is important. Safety  Wear your seat belt at all times when driving.  Make a list of emergency phone numbers, including numbers for family, friends, the hospital, and police and fire departments. General instructions  Ask your health care provider for a referral to a local prenatal education class. Begin classes no later than the beginning of month 6 of your pregnancy.  Ask for help if you have counseling or nutritional needs during pregnancy. Your health care provider can offer advice or refer you to specialists for help with various needs.  Do not use hot tubs, steam rooms, or saunas.  Do not douche or use tampons or scented sanitary pads.  Do not cross your legs for long periods of time.  Avoid cat litter boxes and soil used by cats. These carry germs that can cause birth defects in the baby and possibly loss of the fetus by miscarriage or stillbirth.  Avoid all smoking, herbs, alcohol, and unprescribed drugs. Chemicals in these products   can affect the formation and growth of the baby.  Do not use any products that contain nicotine or tobacco, such as cigarettes and e-cigarettes. If you need help quitting, ask your health care provider.  Visit your dentist if you have not gone yet during your pregnancy. Use a soft toothbrush to brush your teeth and be gentle when you floss. Contact a health care provider if:  You have dizziness.  You have mild pelvic cramps, pelvic pressure, or nagging pain in the abdominal area.  You have persistent nausea, vomiting, or diarrhea.  You have a bad smelling vaginal discharge.  You have pain when you urinate. Get help right away if:  You have a fever.  You are leaking fluid from your vagina.  You have spotting or bleeding from your vagina.  You have severe abdominal cramping or pain.  You have rapid weight gain or weight  loss.  You have shortness of breath with chest pain.  You notice sudden or extreme swelling of your face, hands, ankles, feet, or legs.  You have not felt your baby move in over an hour.  You have severe headaches that do not go away when you take medicine.  You have vision changes. Summary  The second trimester is from week 14 through week 27 (months 4 through 6). It is also a time when the fetus is growing rapidly.  Your body goes through many changes during pregnancy. The changes vary from woman to woman.  Avoid all smoking, herbs, alcohol, and unprescribed drugs. These chemicals affect the formation and growth your baby.  Do not use any tobacco products, such as cigarettes, chewing tobacco, and e-cigarettes. If you need help quitting, ask your health care provider.  Contact your health care provider if you have any questions. Keep all prenatal visits as told by your health care provider. This is important. This information is not intended to replace advice given to you by your health care provider. Make sure you discuss any questions you have with your health care provider. Document Released: 03/23/2001 Document Revised: 09/04/2015 Document Reviewed: 05/30/2012 [employer] Interactive Patient Education  2017 [employer]   Breastfeeding Deciding to breastfeed is one of the best choices you can make for you and your baby. A change in hormones during pregnancy causes your breast tissue to grow and increases the number and size of your milk ducts. These hormones also allow proteins, sugars, and fats from your blood supply to make breast milk in your milk-producing glands. Hormones prevent breast milk from being released before your baby is born as well as prompt milk flow after birth. Once breastfeeding has begun, thoughts of your baby, as well as his or her sucking or crying, can stimulate the release of milk from your milk-producing glands. Benefits of breastfeeding For Your  Baby  Your first milk (colostrum) helps your baby's digestive system function better.  There are antibodies in your milk that help your baby fight off infections.  Your baby has a lower incidence of asthma, allergies, and sudden infant death syndrome.  The nutrients in breast milk are better for your baby than infant formulas and are designed uniquely for your baby's needs.  Breast milk improves your baby's brain development.  Your baby is less likely to develop other conditions, such as childhood obesity, asthma, or type 2 diabetes mellitus.  For You  Breastfeeding helps to create a very special bond between you and your baby.  Breastfeeding is convenient. Breast milk is always available at   the correct temperature and costs nothing.  Breastfeeding helps to burn calories and helps you lose the weight gained during pregnancy.  Breastfeeding makes your uterus contract to its prepregnancy size faster and slows bleeding (lochia) after you give birth.  Breastfeeding helps to lower your risk of developing type 2 diabetes mellitus, osteoporosis, and breast or ovarian cancer later in life.  Signs that your baby is hungry Early Signs of Hunger  Increased alertness or activity.  Stretching.  Movement of the head from side to side.  Movement of the head and opening of the mouth when the corner of the mouth or cheek is stroked (rooting).  Increased sucking sounds, smacking lips, cooing, sighing, or squeaking.  Hand-to-mouth movements.  Increased sucking of fingers or hands.  Late Signs of Hunger  Fussing.  Intermittent crying.  Extreme Signs of Hunger Signs of extreme hunger will require calming and consoling before your baby will be able to breastfeed successfully. Do not wait for the following signs of extreme hunger to occur before you initiate breastfeeding:  Restlessness.  A loud, strong cry.  Screaming.  Breastfeeding basics Breastfeeding Initiation  Find a  comfortable place to sit or lie down, with your neck and back well supported.  Place a pillow or rolled up blanket under your baby to bring him or her to the level of your breast (if you are seated). Nursing pillows are specially designed to help support your arms and your baby while you breastfeed.  Make sure that your baby's abdomen is facing your abdomen.  Gently massage your breast. With your fingertips, massage from your chest wall toward your nipple in a circular motion. This encourages milk flow. You may need to continue this action during the feeding if your milk flows slowly.  Support your breast with 4 fingers underneath and your thumb above your nipple. Make sure your fingers are well away from your nipple and your baby's mouth.  Stroke your baby's lips gently with your finger or nipple.  When your baby's mouth is open wide enough, quickly bring your baby to your breast, placing your entire nipple and as much of the colored area around your nipple (areola) as possible into your baby's mouth. ? More areola should be visible above your baby's upper lip than below the lower lip. ? Your baby's tongue should be between his or her lower gum and your breast.  Ensure that your baby's mouth is correctly positioned around your nipple (latched). Your baby's lips should create a seal on your breast and be turned out (everted).  It is common for your baby to suck about 2-3 minutes in order to start the flow of breast milk.  Latching Teaching your baby how to latch on to your breast properly is very important. An improper latch can cause nipple pain and decreased milk supply for you and poor weight gain in your baby. Also, if your baby is not latched onto your nipple properly, he or she may swallow some air during feeding. This can make your baby fussy. Burping your baby when you switch breasts during the feeding can help to get rid of the air. However, teaching your baby to latch on properly is  still the best way to prevent fussiness from swallowing air while breastfeeding. Signs that your baby has successfully latched on to your nipple:  Silent tugging or silent sucking, without causing you pain.  Swallowing heard between every 3-4 sucks.  Muscle movement above and in front of his or her   ears while sucking.  Signs that your baby has not successfully latched on to nipple:  Sucking sounds or smacking sounds from your baby while breastfeeding.  Nipple pain.  If you think your baby has not latched on correctly, slip your finger into the corner of your baby's mouth to break the suction and place it between your baby's gums. Attempt breastfeeding initiation again. Signs of Successful Breastfeeding Signs from your baby:  A gradual decrease in the number of sucks or complete cessation of sucking.  Falling asleep.  Relaxation of his or her body.  Retention of a small amount of milk in his or her mouth.  Letting go of your breast by himself or herself.  Signs from you:  Breasts that have increased in firmness, weight, and size 1-3 hours after feeding.  Breasts that are softer immediately after breastfeeding.  Increased milk volume, as well as a change in milk consistency and color by the fifth day of breastfeeding.  Nipples that are not sore, cracked, or bleeding.  Signs That Your Baby is Getting Enough Milk  Wetting at least 1-2 diapers during the first 24 hours after birth.  Wetting at least 5-6 diapers every 24 hours for the first week after birth. The urine should be clear or pale yellow by 5 days after birth.  Wetting 6-8 diapers every 24 hours as your baby continues to grow and develop.  At least 3 stools in a 24-hour period by age 5 days. The stool should be soft and yellow.  At least 3 stools in a 24-hour period by age 7 days. The stool should be seedy and yellow.  No loss of weight greater than 10% of birth weight during the first 3 days of age.  Average  weight gain of 4-7 ounces (113-198 g) per week after age 4 days.  Consistent daily weight gain by age 5 days, without weight loss after the age of 2 weeks.  After a feeding, your baby may spit up a small amount. This is common. Breastfeeding frequency and duration Frequent feeding will help you make more milk and can prevent sore nipples and breast engorgement. Breastfeed when you feel the need to reduce the fullness of your breasts or when your baby shows signs of hunger. This is called "breastfeeding on demand." Avoid introducing a pacifier to your baby while you are working to establish breastfeeding (the first 4-6 weeks after your baby is born). After this time you may choose to use a pacifier. Research has shown that pacifier use during the first year of a baby's life decreases the risk of sudden infant death syndrome (SIDS). Allow your baby to feed on each breast as long as he or she wants. Breastfeed until your baby is finished feeding. When your baby unlatches or falls asleep while feeding from the first breast, offer the second breast. Because newborns are often sleepy in the first few weeks of life, you may need to awaken your baby to get him or her to feed. Breastfeeding times will vary from baby to baby. However, the following rules can serve as a guide to help you ensure that your baby is properly fed:  Newborns (babies 4 weeks of age or younger) may breastfeed every 1-3 hours.  Newborns should not go longer than 3 hours during the day or 5 hours during the night without breastfeeding.  You should breastfeed your baby a minimum of 8 times in a 24-hour period until you begin to introduce solid foods to your   baby at around 6 months of age.  Breast milk pumping Pumping and storing breast milk allows you to ensure that your baby is exclusively fed your breast milk, even at times when you are unable to breastfeed. This is especially important if you are going back to work while you are still  breastfeeding or when you are not able to be present during feedings. Your lactation consultant can give you guidelines on how long it is safe to store breast milk. A breast pump is a machine that allows you to pump milk from your breast into a sterile bottle. The pumped breast milk can then be stored in a refrigerator or freezer. Some breast pumps are operated by hand, while others use electricity. Ask your lactation consultant which type will work best for you. Breast pumps can be purchased, but some hospitals and breastfeeding support groups lease breast pumps on a monthly basis. A lactation consultant can teach you how to hand express breast milk, if you prefer not to use a pump. Caring for your breasts while you breastfeed Nipples can become dry, cracked, and sore while breastfeeding. The following recommendations can help keep your breasts moisturized and healthy:  Avoid using soap on your nipples.  Wear a supportive bra. Although not required, special nursing bras and tank tops are designed to allow access to your breasts for breastfeeding without taking off your entire bra or top. Avoid wearing underwire-style bras or extremely tight bras.  Air dry your nipples for 3-4minutes after each feeding.  Use only cotton bra pads to absorb leaked breast milk. Leaking of breast milk between feedings is normal.  Use lanolin on your nipples after breastfeeding. Lanolin helps to maintain your skin's normal moisture barrier. If you use pure lanolin, you do not need to wash it off before feeding your baby again. Pure lanolin is not toxic to your baby. You may also hand express a few drops of breast milk and gently massage that milk into your nipples and allow the milk to air dry.  In the first few weeks after giving birth, some women experience extremely full breasts (engorgement). Engorgement can make your breasts feel heavy, warm, and tender to the touch. Engorgement peaks within 3-5 days after you give  birth. The following recommendations can help ease engorgement:  Completely empty your breasts while breastfeeding or pumping. You may want to start by applying warm, moist heat (in the shower or with warm water-soaked hand towels) just before feeding or pumping. This increases circulation and helps the milk flow. If your baby does not completely empty your breasts while breastfeeding, pump any extra milk after he or she is finished.  Wear a snug bra (nursing or regular) or tank top for 1-2 days to signal your body to slightly decrease milk production.  Apply ice packs to your breasts, unless this is too uncomfortable for you.  Make sure that your baby is latched on and positioned properly while breastfeeding.  If engorgement persists after 48 hours of following these recommendations, contact your health care provider or a lactation consultant. Overall health care recommendations while breastfeeding  Eat healthy foods. Alternate between meals and snacks, eating 3 of each per day. Because what you eat affects your breast milk, some of the foods may make your baby more irritable than usual. Avoid eating these foods if you are sure that they are negatively affecting your baby.  Drink milk, fruit juice, and water to satisfy your thirst (about 10 glasses a day).    Rest often, relax, and continue to take your prenatal vitamins to prevent fatigue, stress, and anemia.  Continue breast self-awareness checks.  Avoid chewing and smoking tobacco. Chemicals from cigarettes that pass into breast milk and exposure to secondhand smoke may harm your baby.  Avoid alcohol and drug use, including marijuana. Some medicines that may be harmful to your baby can pass through breast milk. It is important to ask your health care provider before taking any medicine, including all over-the-counter and prescription medicine as well as vitamin and herbal supplements. It is possible to become pregnant while breastfeeding.  If birth control is desired, ask your health care provider about options that will be safe for your baby. Contact a health care provider if:  You feel like you want to stop breastfeeding or have become frustrated with breastfeeding.  You have painful breasts or nipples.  Your nipples are cracked or bleeding.  Your breasts are red, tender, or warm.  You have a swollen area on either breast.  You have a fever or chills.  You have nausea or vomiting.  You have drainage other than breast milk from your nipples.  Your breasts do not become full before feedings by the fifth day after you give birth.  You feel sad and depressed.  Your baby is too sleepy to eat well.  Your baby is having trouble sleeping.  Your baby is wetting less than 3 diapers in a 24-hour period.  Your baby has less than 3 stools in a 24-hour period.  Your baby's skin or the white part of his or her eyes becomes yellow.  Your baby is not gaining weight by 5 days of age. Get help right away if:  Your baby is overly tired (lethargic) and does not want to wake up and feed.  Your baby develops an unexplained fever. This information is not intended to replace advice given to you by your health care provider. Make sure you discuss any questions you have with your health care provider. Document Released: 03/29/2005 Document Revised: 09/10/2015 Document Reviewed: 09/20/2012 [employer] Interactive Patient Education  2017 [employer]  

## 2016-06-29 ENCOUNTER — Ambulatory Visit (INDEPENDENT_AMBULATORY_CARE_PROVIDER_SITE_OTHER): Payer: Medicaid Other | Admitting: Clinical

## 2016-06-29 ENCOUNTER — Ambulatory Visit (HOSPITAL_COMMUNITY)
Admission: RE | Admit: 2016-06-29 | Discharge: 2016-06-29 | Disposition: A | Discharge status: Home / Self Care | Payer: Medicaid Other | Source: Ambulatory Visit | Attending: Family Medicine | Admitting: Family Medicine

## 2016-06-29 ENCOUNTER — Encounter (HOSPITAL_COMMUNITY): Payer: Self-pay

## 2016-06-29 DIAGNOSIS — Z3A24 24 weeks gestation of pregnancy: Secondary | ICD-10-CM | POA: Diagnosis not present

## 2016-06-29 DIAGNOSIS — O0992 Supervision of high risk pregnancy, unspecified, second trimester: Secondary | ICD-10-CM

## 2016-06-29 DIAGNOSIS — O3482 Maternal care for other abnormalities of pelvic organs, second trimester: Secondary | ICD-10-CM | POA: Diagnosis not present

## 2016-06-29 DIAGNOSIS — O99212 Obesity complicating pregnancy, second trimester: Secondary | ICD-10-CM | POA: Insufficient documentation

## 2016-06-29 DIAGNOSIS — F39 Unspecified mood [affective] disorder: Secondary | ICD-10-CM | POA: Diagnosis not present

## 2016-06-29 DIAGNOSIS — N83209 Unspecified ovarian cyst, unspecified side: Secondary | ICD-10-CM

## 2016-06-29 DIAGNOSIS — Z362 Encounter for other antenatal screening follow-up: Secondary | ICD-10-CM | POA: Diagnosis present

## 2016-06-29 NOTE — BH Specialist Note (Addendum)
Integrated Behavioral Health Follow Up Visit  MRN: [id number] Name: Christine Trujillo   Session Start time: 4:25 Session End time: 4:55 Total time: 30 minutes Number of Integrated Behavioral Health Clinician visits: 2/10  Type of Service: Integrated Behavioral Health- Individual/Family Interpretor:No. Interpretor Name and Language: n/a   Warm Hand Off Completed.       SUBJECTIVE: Christine BastosCheyenne Sutphin is a 21 y.o. female accompanied by FOB and roommate. Patient was referred by f/u for depression and anxiety Patient reports the following symptoms/concerns: Pt states her primary concern is that she does not think her BH meds are working(began taking about one week prior), and worries about medical care she is receiving. Duration of problem: Over one month; Severity of problem: moderate  OBJECTIVE: Mood: Irritable and Affect: Appropriate Risk of harm to self or others: No plan to harm self or others   LIFE CONTEXT: Family and Social: Lives with FOB and his four children, along with female friend School/Work: - Self-Care: - Life Changes: Current pregnancy  GOALS ADDRESSED: Patient will reduce symptoms of: agitation, anxiety and depression   INTERVENTIONS: Motivational Interviewing Standardized Assessments completed: GAD-7 and PHQ 9  ASSESSMENT: Patient currently experiencing Mood disorder. Patient may benefit from brief therapeutic intervention today and [facility]  PLAN: 1. Follow up with behavioral health clinician on : As needed 2. Behavioral recommendations:  -[facility] for ongoing counseling and BH med management 3. Referral(s): [facility] (In Clinic) and [facility]) 4. "From scale of 1-10, how likely are you to follow plan?": 9  Rae LipsJamie C Nezar Buckles, LCSWA  Dep[facility] screen [facility] 06/29/2016 04/22/2016 03/25/2016  Decreased Interest 2 3  2   Down, Depressed, Hopeless 2 2 0  PHQ - 2 Score 4 5 2   Altered sleeping 3 3 3   Tired, decreased energy 3 3 3   Change in appetite 3 2 2   Feeling bad or failure about yourself  0 0 0  Trouble concentrating 1 2 0  Moving slowly or fidgety/restless 0 0 0  Suicidal thoughts 0 0 0  PHQ-9 Score 14 15 10    GAD 7 : Generalized Anxiety Score 06/29/2016 04/22/2016  Nervous, Anxious, on Edge 3 2  Control/stop worrying 1 2  Worry too much - different things 1 2  Trouble relaxing 3 2  Restless 2 0  Easily annoyed or irritable 3 3  Afraid - awful might happen 0 1  Total GAD 7 Score 13 12

## 2016-07-16 ENCOUNTER — Ambulatory Visit (INDEPENDENT_AMBULATORY_CARE_PROVIDER_SITE_OTHER): Payer: Medicaid Other | Admitting: Family Medicine

## 2016-07-16 VITALS — BP 119/81 | HR 94 | Temp 98.3°F | Wt 277.0 lb

## 2016-07-16 DIAGNOSIS — M549 Dorsalgia, unspecified: Secondary | ICD-10-CM

## 2016-07-16 DIAGNOSIS — O9989 Other specified diseases and conditions complicating pregnancy, childbirth and the puerperium: Secondary | ICD-10-CM

## 2016-07-16 DIAGNOSIS — O26892 Other specified pregnancy related conditions, second trimester: Secondary | ICD-10-CM | POA: Diagnosis not present

## 2016-07-16 DIAGNOSIS — O0992 Supervision of high risk pregnancy, unspecified, second trimester: Secondary | ICD-10-CM

## 2016-07-16 DIAGNOSIS — O99212 Obesity complicating pregnancy, second trimester: Secondary | ICD-10-CM | POA: Diagnosis not present

## 2016-07-16 DIAGNOSIS — O99891 Other specified diseases and conditions complicating pregnancy: Secondary | ICD-10-CM

## 2016-07-16 NOTE — Progress Notes (Signed)
Patient complaining that she hasnt been able to sleep. Patient states she hasnt felt well for three days. Armandina Stammer RNBSN

## 2016-07-16 NOTE — Patient Instructions (Signed)

## 2016-07-16 NOTE — Progress Notes (Signed)
   PRENATAL VISIT NOTE  Subjective:  Christine Trujillo is a 21 y.o. G1P0 at [redacted]w[redacted]d being seen today for ongoing prenatal care.  She is currently monitored for the following issues for this low-risk pregnancy and has Vaginal trichomoniasis in Pregnancy; Supervision of high risk pregnancy in second trimester; Right ovarian cyst affecting pregnancy in second trimester, antepartum; BMI 45.0-49.9, adult (HCC); Obesity affecting pregnancy in second trimester; and Depression on her problem list.  Patient reports Having back pain. Was seen last month and has continued pain. Flexeril not helping. Initially, tylenol helped, but not any more. Pain radiates down left leg. worse with movement..  Contractions: Not present. Vag. Bleeding: None.  Movement: Present. Denies leaking of fluid.   The following portions of the patient's history were reviewed and updated as appropriate: allergies, current medications, past family history, past medical history, past social history, past surgical history and problem list. Problem list updated.  Objective:   Vitals:   07/16/16 0944  BP: 119/81  Pulse: 94  Temp: 98.3 F (36.8 C)  Weight: 277 lb (125.6 kg)    Fetal Status: Fetal Heart Rate (bpm): 138   Movement: Present     General:  Alert, oriented and cooperative. Patient is in no acute distress.  Skin: Skin is warm and dry. No rash noted.   Cardiovascular: Normal heart rate noted  Respiratory: Normal respiratory effort, no problems with respiration noted  Abdomen: Soft, gravid, appropriate for gestational age. Pain/Pressure: Absent     Pelvic:  Cervical exam deferred        Extremities: Normal range of motion.  Edema: None  Mental Status: Normal mood and affect. Normal behavior. Normal judgment and thought content.   Assessment and Plan:  Pregnancy: G1P0 at [redacted]w[redacted]d  1. Supervision of high risk pregnancy in second trimester FHT and FH normal. Fasting 28 week labs with 2hr GTT in 2 weeks.  2. Obesity  affecting pregnancy in second trimester  3. Back pain affecting pregnancy in second trimester Will have pt take ibuprofen  x 2 doses today and 2 tomorrow. Will refer to sports medicine.  Preterm labor symptoms and general obstetric precautions including but not limited to vaginal bleeding, contractions, leaking of fluid and fetal movement were reviewed in detail with the patient. Please refer to After Visit Summary for other counseling recommendations.  No Follow-up on file.   Levie Heritage, DO

## 2016-07-19 ENCOUNTER — Encounter: Payer: Self-pay | Admitting: Family Medicine

## 2016-07-19 ENCOUNTER — Ambulatory Visit (INDEPENDENT_AMBULATORY_CARE_PROVIDER_SITE_OTHER): Payer: Medicaid Other | Admitting: Family Medicine

## 2016-07-19 DIAGNOSIS — M5442 Lumbago with sciatica, left side: Secondary | ICD-10-CM | POA: Diagnosis not present

## 2016-07-19 DIAGNOSIS — G8929 Other chronic pain: Secondary | ICD-10-CM

## 2016-07-19 DIAGNOSIS — M545 Low back pain, unspecified: Secondary | ICD-10-CM

## 2016-07-19 NOTE — Patient Instructions (Addendum)
There are a combination of things you can do that will help with your back pain. Start physical therapy and do home exercises, stretches on days you don't go to therapy. Look into prenatal yoga, water walking, water aerobics as these would be helpful. Some people also benefit from acupuncture. I would not do chiropractic care. Tylenol  1-2 tabs three times a day regularly for baseline pain relief. Unfortunately flexeril is the best muscle relaxant to use with pregnancy - none of the others are equivalent when it comes to the safety profile. Inserts tend to be helpful with the additional cushion (something like spencos, dr. Jari [employer]man active series, or our green inserts). Sit only in chairs with back support. Sleep with pillow between knees. Heat or ice (whichever feels better) 15 minutes at a time 3-4 times a day at least. Can consider a formal massage though ones you've had at home seem to make pain worse. I do think this will improve a lot after your pregnancy or if the cyst decreases in size. Follow up with me as needed.

## 2016-07-21 DIAGNOSIS — M545 Low back pain, unspecified: Secondary | ICD-10-CM | POA: Insufficient documentation

## 2016-07-21 NOTE — Assessment & Plan Note (Signed)
She does have a little tenderness on left side within lumbar musculature but pain felt primarily to be due to large ovarian cyst pushing fetus more to left side leading to nerve compression especially in evenings.  We discussed all the conservative measures she could try to help alleviate the pain - she will start physical therapy.  Consider prenatal yoga, water aerobics, possibly acupuncture.  Avoid chiropractic care.  Tylenol regularly.  Flexeril only muscle relaxant with pregnancy category B so would not change this.  Arch supports, ensure chairs have appropriate back support.  Heat/ice.  F/u in 6 weeks or prn.

## 2016-07-21 NOTE — Progress Notes (Signed)
PCP: No PCP Consultation requested by Dr Adrian Blackwater  Subjective:   HPI: Patient is a 21 y.o. female here for low back pain.  Patient is currently [redacted] weeks pregnant - reports having severe low back pain radiating into left leg. Has worsened as pregnancy has gone on. Worse at nighttime. Unable to get comfortable. Pain into left leg associated with numbness. Associated spasms that can last 20 minutes to an hour. No prior issues with back. Tried tylenol, ibuprofen, flexeril without benefit. Massage seems to worsen her pain. Tried some topical creams. No bowel/bladder dysfunction (increased urinary frequency related to pregnancy). She has a right ovarian cyst measuring 18 x 14.5 x 10.3cm. Pain level up to 10/10 and sharp in evening.  Past Medical History:  Diagnosis Date  . Headache   . Migraine   . Obesity   . Paratubal cyst 02/19/2016    Current Outpatient Prescriptions on File Prior to Visit  Medication Sig Dispense Refill  . cetirizine (ZYRTEC) 10 MG tablet Take 1 tablet (10 mg total) by mouth daily. 30 tablet 2  . cyclobenzaprine (FLEXERIL) 10 MG tablet Take 1 tablet (10 mg total) by mouth every 8 (eight) hours as needed for muscle spasms. 30 tablet 1  . Doxylamine-Pyridoxine (DICLEGIS) 10-10 MG TBEC Take 1 tablet by mouth 4 (four) times daily as needed. 100 tablet 3  . polyethylene glycol (MIRALAX) packet Take 17 g by mouth daily. 14 each 0  . Prenat w/o A Vit-FeFum-FePo-FA (CONCEPT OB) 130-92.4-1 MG CAPS Take 1 tablet by mouth daily. 30 capsule 12  . promethazine (PHENERGAN) 25 MG tablet Take 25 mg by mouth every 6 (six) hours as needed for nausea or vomiting.    . sertraline (ZOLOFT) 50 MG tablet Take 1 tablet (50 mg total) by mouth daily. 30 tablet 4   No current facility-administered medications on file prior to visit.     Past Surgical History:  Procedure Laterality Date  . WISDOM TOOTH EXTRACTION      No Known Allergies  Social History   Social History  .  Marital status: Single    Spouse name: N/A  . Number of children: N/A  . Years of education: N/A   Occupational History  . Not on file.   Social History Main Topics  . Smoking status: Never Smoker  . Smokeless tobacco: Never Used  . Alcohol use No  . Drug use: Yes    Types: Marijuana     Comment: last use Nov 2017  . Sexual activity: Yes    Birth control/ protection: None   Other Topics Concern  . Not on file   Social History Narrative  . No narrative on file    Family History  Problem Relation Age of Onset  . Heart disease Mother     BP (!) 144/96   Pulse 99   Ht  (1.6 m)   Wt 277 lb (125.6 kg)   LMP 01/12/2016 Comment: home UPT x2 positive  BMI 49.07 kg/m   Review of Systems: See HPI above.     Objective:  Physical Exam:  Gen: NAD, pregnant  Back: No gross deformity, scoliosis. TTP left lumbar paraspinal region.  No midline or bony TTP. Full extension.  Flexion limited to 60 degrees.  Full lateral rotations.  No pain with motions. Strength LEs 5/5 all muscle groups.   2+ MSRs in patellar and achilles tendons, equal bilaterally. Negative SLRs. Sensation intact to light touch bilaterally. Negative logroll bilateral hips   Assessment &  Plan:  1. Low back pain - She does have a little tenderness on left side within lumbar musculature but pain felt primarily to be due to large ovarian cyst pushing fetus more to left side leading to nerve compression especially in evenings.  We discussed all the conservative measures she could try to help alleviate the pain - she will start physical therapy.  Consider prenatal yoga, water aerobics, possibly acupuncture.  Avoid chiropractic care.  Tylenol regularly.  Flexeril only muscle relaxant with pregnancy category B so would not change this.  Arch supports, ensure chairs have appropriate back support.  Heat/ice.  F/u in 6 weeks or prn.

## 2016-07-22 ENCOUNTER — Telehealth: Payer: Self-pay

## 2016-07-22 NOTE — Telephone Encounter (Signed)
Patient complaining of both eyes being red and having a "red vein" look to them and slightly swollen. Patient encouraged to follow up with primary care physician and if she doesn't have one to go to an urgent care to be seen. Armandina Stammer RNBSN

## 2016-07-28 ENCOUNTER — Ambulatory Visit: Payer: Medicaid Other | Admitting: Physical Therapy

## 2016-07-29 ENCOUNTER — Ambulatory Visit (INDEPENDENT_AMBULATORY_CARE_PROVIDER_SITE_OTHER): Payer: Medicaid Other | Admitting: Family Medicine

## 2016-07-29 VITALS — BP 119/63 | HR 95 | Wt 273.0 lb

## 2016-07-29 DIAGNOSIS — O0992 Supervision of high risk pregnancy, unspecified, second trimester: Secondary | ICD-10-CM | POA: Diagnosis not present

## 2016-07-29 DIAGNOSIS — O99212 Obesity complicating pregnancy, second trimester: Secondary | ICD-10-CM

## 2016-07-29 DIAGNOSIS — O9989 Other specified diseases and conditions complicating pregnancy, childbirth and the puerperium: Secondary | ICD-10-CM

## 2016-07-29 DIAGNOSIS — O26892 Other specified pregnancy related conditions, second trimester: Secondary | ICD-10-CM | POA: Diagnosis not present

## 2016-07-29 DIAGNOSIS — M549 Dorsalgia, unspecified: Secondary | ICD-10-CM

## 2016-07-29 DIAGNOSIS — O219 Vomiting of pregnancy, unspecified: Secondary | ICD-10-CM

## 2016-07-29 MED ORDER — ONDANSETRON 4 MG PO TBDP: 4.0000 mg | ORAL_TABLET | Freq: Four times a day (QID) | ORAL | 0 refills | Status: DC | PRN

## 2016-07-29 MED ORDER — TETANUS-DIPHTH-ACELL PERTUSSIS 5-2.5-18.5 LF-MCG/0.5 IM SUSP: 0.5000 mL | Freq: Once | INTRAMUSCULAR | Status: DC

## 2016-07-29 MED ORDER — CYCLOBENZAPRINE HCL 10 MG PO TABS: 10.0000 mg | ORAL_TABLET | Freq: Three times a day (TID) | ORAL | 3 refills | Status: DC | PRN

## 2016-07-29 NOTE — Progress Notes (Signed)
Patient is NOT fasting for two hour gtt. Will return one day next week to complete. Armandina Stammer RNBSN

## 2016-07-29 NOTE — Progress Notes (Signed)
   PRENATAL VISIT NOTE  Subjective:  Christine Trujillo is a 21 y.o. G1P0 at [redacted]w[redacted]d being seen today for ongoing prenatal care.  She is currently monitored for the following issues for this high-risk pregnancy and has Vaginal trichomoniasis in Pregnancy; Supervision of high risk pregnancy in second trimester; Right ovarian cyst affecting pregnancy in second trimester, antepartum; BMI 45.0-49.9, adult (HCC); Obesity affecting pregnancy in second trimester; Depression; and Low back pain on her problem list.  Patient reports backache, nausea and vomiting. Still vomits every other day - will vomit for the entire day. Has blood streaked emesis and had occular hematoma in sclera. Contractions: Not present. Vag. Bleeding: None.  Movement: Present. Denies leaking of fluid.   The following portions of the patient's history were reviewed and updated as appropriate: allergies, current medications, past family history, past medical history, past social history, past surgical history and problem list. Problem list updated.  Objective:   Vitals:   07/29/16 0924  BP: 119/63  Pulse: 95  Weight: 273 lb (123.8 kg)    Fetal Status: Fetal Heart Rate (bpm): 155   Movement: Present     General:  Alert, oriented and cooperative. Patient is in no acute distress.  Skin: Skin is warm and dry. No rash noted.   Cardiovascular: Normal heart rate noted  Respiratory: Normal respiratory effort, no problems with respiration noted  Abdomen: Soft, gravid, appropriate for gestational age. Pain/Pressure: Present     Pelvic:  Cervical exam deferred        Extremities: Normal range of motion.  Edema: None  Mental Status: Normal mood and affect. Normal behavior. Normal judgment and thought content.   Assessment and Plan:  Pregnancy: G1P0 at [redacted]w[redacted]d  1. Supervision of high risk pregnancy in second trimester FHT and FH normal  2. Obesity affecting pregnancy in second trimester  3. Nausea and vomiting of pregnancy,  antepartum Continue diclegis. Will add zofran.  4. Back pain affecting pregnancy in second trimester Refill flexeril. Has seen sports medicine. Will be going to PT.  Preterm labor symptoms and general obstetric precautions including but not limited to vaginal bleeding, contractions, leaking of fluid and fetal movement were reviewed in detail with the patient. Please refer to After Visit Summary for other counseling recommendations.  No Follow-up on file.   Levie Heritage, DO

## 2016-08-03 ENCOUNTER — Ambulatory Visit: Payer: Medicaid Other | Attending: Family Medicine | Admitting: Physical Therapy

## 2016-08-03 DIAGNOSIS — R293 Abnormal posture: Secondary | ICD-10-CM | POA: Diagnosis present

## 2016-08-03 DIAGNOSIS — M545 Low back pain: Secondary | ICD-10-CM | POA: Diagnosis not present

## 2016-08-03 NOTE — Patient Instructions (Signed)
Hamstring Step 2    Left foot relaxed, knee straight, other leg bent, foot flat. Raise straight leg further upward to maximal range. Hold _30__ seconds. Relax leg completely down. Repeat _3__ times.   Single Knee to Chest    10 times with 10 second hold each leg   Lower lumbar rotation     With knees bent, rock knees side to side - 10-15 times each direction   Hip Flexor Stretch    Interlace fingers on top of right knee. Shift weight forward. Continue breathing normally and hold position for _30__ breaths. Repeat on other leg. Alternate sides _3__ times.    Seated piriformis stretch    Seated figure 4 position - press down on knee and bend forward at the hips - 30 seconds, 3 times each side   PELVIC TILT: Posterior    Tighten abdominals, flatten low back. __15_ reps per set, _2__ sets per day.   Resistive Band Rowing   With resistive band anchored in door, grasp both ends. Keeping elbows bent, pull back, squeezing shoulder blades together. Hold __5__ seconds. Repeat __15__ times. Do __2__ sessions per day.   Sleeping on Back  Place pillow under knees. A pillow with cervical support and a roll around waist are also helpful. Copyright  [employer]. All rights reserved.  Sleeping on Side Place pillow between knees. Use cervical support under neck and a roll around waist as needed. Copyright  V[employer]. All rights reserved.   Sleeping on Stomach   If this is the only desirable sleeping position, place pillow under lower legs, and under stomach or chest as needed.  Posture - Sitting   Sit upright, head facing forward. Try using a roll to support lower back. Keep shoulders relaxed, and avoid rounded back. Keep hips level with knees. Avoid crossing legs for long periods. Stand to Sit / Sit to Stand   To sit: Bend knees to lower self onto front edge of chair, then scoot back on seat. To stand: Reverse sequence by placing one foot forward, and scoot to front of seat.  Use rocking motion to stand up.   Work Height and Reach  Ideal work height is no more than 2 to 4 inches below elbow level when standing, and at elbow level when sitting. Reaching should be limited to arm's length, with elbows slightly bent.  Bending  Bend at hips and knees, not back. Keep feet shoulder-width apart.    Posture - Standing   Good posture is important. Avoid slouching and forward head thrust. Maintain curve in low back and align ears over shoul- ders, hips over ankles.  Alternating Positions   Alternate tasks and change positions frequently to reduce fatigue and muscle tension. Take rest breaks. Computer Work   Position work to Art gallery manager. Use proper work and seat height. Keep shoulders back and down, wrists straight, and elbows at right angles. Use chair that provides full back support. Add footrest and lumbar roll as needed.  Getting Into / Out of Car  Lower self onto seat, scoot back, then bring in one leg at a time. Reverse sequence to get out.  Dressing  Lie on back to pull socks or slacks over feet, or sit and bend leg while keeping back straight.    Housework - Sink  Place one foot on ledge of cabinet under sink when standing at sink for prolonged periods.   Pushing / Pulling  Pushing is preferable to pulling. Keep back in proper alignment, and use  leg muscles to do the work.  Deep Squat   Squat and lift with both arms held against upper trunk. Tighten stomach muscles without holding breath. Use smooth movements to avoid jerking.  Avoid Twisting   Avoid twisting or bending back. Pivot around using foot movements, and bend at knees if needed when reaching for articles.  Carrying Luggage   Distribute weight evenly on both sides. Use a cart whenever possible. Do not twist trunk. Move body as a unit.   Lifting Principles .Maintain proper posture and head alignment. .Slide object as close as possible before lifting. .Move obstacles out  of the way. .Test before lifting; ask for help if too heavy. .Tighten stomach muscles without holding breath. .Use smooth movements; do not jerk. .Use legs to do the work, and pivot with feet. .Distribute the work load symmetrically and close to the center of trunk. .Push instead of pull whenever possible.   Ask For Help   Ask for help and delegate to others when possible. Coordinate your movements when lifting together, and maintain the low back curve.  Log Roll   Lying on back, bend left knee and place left arm across chest. Roll all in one movement to the right. Reverse to roll to the left. Always move as one unit. Housework - Sweeping  Use long-handled equipment to avoid stooping.   Housework - Wiping  Position yourself as close as possible to reach work surface. Avoid straining your back.  Laundry - Unloading Wash   To unload small items at bottom of washer, lift leg opposite to arm being used to reach.  Gardening - Raking  Move close to area to be raked. Use arm movements to do the work. Keep back straight and avoid twisting.     Cart  When reaching into cart with one arm, lift opposite leg to keep back straight.   Getting Into / Out of Bed  Lower self to lie down on one side by raising legs and lowering head at the same time. Use arms to assist moving without twisting. Bend both knees to roll onto back if desired. To sit up, start from lying on side, and use same move-ments in reverse. Housework - Vacuuming  Hold the vacuum with arm held at side. Step back and forth to move it, keeping head up. Avoid twisting.   Laundry - Armed forces training and education officer so that bending and twisting can be avoided.   Laundry - Unloading Dryer  Squat down to reach into clothes dryer or use a reacher.  Gardening - Weeding / Psychiatric nurse or Kneel. Knee pads may be helpful.

## 2016-08-03 NOTE — Therapy (Addendum)
[facility] [address]  [place], [place], [address] Phone: [phone]   Fax:  [phone]  Physical Therapy Evaluation  Patient Details  Name: Christine Trujillo MRN: [id number] Date of Birth: 02/27/1996 Referring Provider: Dr. Karlton Lemon  Encounter Date: 08/03/2016      PT End of Session - 08/03/16 1800    Visit Number 1   PT Start Time 6294   PT Stop Time 7654   PT Time Calculation (min) 42 min   Activity Tolerance Patient tolerated treatment well   Behavior During Therapy [facility] for tasks assessed/performed      Past Medical History:  Diagnosis Date  . Headache   . Migraine   . Obesity   . Paratubal cyst 02/19/2016    Past Surgical History:  Procedure Laterality Date  . WISDOM TOOTH EXTRACTION      There were no vitals filed for this visit.       Subjective Assessment - 08/03/16 1612    Subjective Patient reports she has been falling - feels like her legs have been giving out. Patient is currently pregnant (29 weeks) - along with cyst on ovary. Patient has been prescribed muscle relaxers - that do not help. Feels like she has no control when her leg "locks up" - last approx 2-3 hours. Does report numbness into L leg.    Patient is accompained by: Family member   Limitations Standing;Walking   Patient Stated Goals improve function   Currently in Pain? No/denies   Pain Score 0-No pain   Pain Location Back   Pain Orientation Lower   Pain Onset More than a month ago   Pain Frequency Intermittent   Aggravating Factors  walking, standing, lying on L side   Pain Relieving Factors muscle relaxer, Tylenol            [facility] PT Assessment - 08/03/16 1616      Assessment   Medical Diagnosis Low back pain radiating to lower extremity   Referring Provider Dr. Karlton Lemon   Onset Date/Surgical Date --  3-4 months ago   Next MD Visit prn   Prior Therapy no     Precautions   Precautions None      Restrictions   Weight Bearing Restrictions No     Balance Screen   Has the patient fallen in the past 6 months Yes   How many times? 3   Has the patient had a decrease in activity level because of a fear of falling?  No   Is the patient reluctant to leave their home because of a fear of falling?  No     Home Environment   Living Environment Private residence   Living Arrangements Spouse/significant other   Type of [place]     Prior Function   Level of [place] Unemployed   Leisure cooking     Cognition   Overall Cognitive Status Within Functional Limits for tasks assessed     Sensation   Light Touch Appears Intact     Coordination   Gross Motor Movements are Fluid and Coordinated Yes     Posture/Postural Control   Posture/Postural Control Postural limitations   Postural Limitations Rounded Shoulders;Forward head     ROM / Strength   AROM / PROM / Strength AROM     AROM   Overall AROM  Within functional limits for tasks performed   Overall AROM Comments some limitations likely  due to pregnancy and soft tissue limiting motion   AROM Assessment Site Lumbar     Flexibility   Soft Tissue Assessment /Muscle Length yes   Hamstrings B tightness   Piriformis B tightness     Palpation   Palpation comment diffusely non-tender; significant other reporting that she has tenderness at midline above gluteal cleft                   [facility] Adult PT Treatment/Exercise - 08/03/16 1616      Exercises   Exercises Lumbar     Lumbar Exercises: Stretches   Passive Hamstring Stretch 3 reps;30 seconds   Passive Hamstring Stretch Limitations bilateral; supine with strap   Single Knee to Chest Stretch 10 seconds   Single Knee to Chest Stretch Limitations 10 reps; bilateral - with towel   Lower Trunk Rotation Limitations 10 reps; 10 seconds - bilateral   Piriformis Stretch 3 reps;30 seconds   Piriformis Stretch Limitations bilateral -  seated figure 4     Lumbar Exercises: Standing   Row Strengthening;Both;15 reps;Theraband   Theraband Level (Row) Level 3 (Green)   Row Limitations VC for form and good scap retraction     Lumbar Exercises: Supine   Ab Set 10 reps;5 seconds                PT Education - 08/03/16 1756    Education provided Yes   Education Details exam findings, HEP, [facility] pro bono clinic, posture and body mechanics   Person(s) Educated Patient   Methods Explanation;Demonstration;Handout   Comprehension Verbalized understanding;Returned demonstration          PT Short Term Goals - 08/03/16 1801      PT SHORT TERM GOAL #1   Title Patient to be independent with HEP    Status Achieved     PT SHORT TERM GOAL #2   Title Patient to verbalize other PT avenues available to her   Status Achieved     PT SHORT TERM GOAL #3   Title Patient to demonstrate good postural alignment   Status Achieved                  Plan - 08/03/16 1801    Clinical Impression Statement Patient is a 23 y/i female presenting to [facility] today for evaulation of primary complaints of low back pain with feelings of L LE "locking up" with difficulty walking. Patient does have a medical history significant for being [redacted] weeks pregnant along with 16 cm ovarian cyst limting formal assessment due to positioning. Patient with B tightnes sin glute mm and B HS and poor posturing. HEP given today with heavy emphasis on stretching and promoting normal movement and ROM at lumbar spine and hips - activities liekly to increase intraabdominal pressure avoided today due to pregnancy and ovarian cyst. PT providing handout and education on [facility] pro bono PT clinic today with good carryover. Will likely beneift from continued PT through pro bono clinic for general pain relief along with good posture/body mechanics.    Rehab Potential Good   PT Frequency One time visit   PT Treatment/Interventions ADLs/Self Care Home  Management;Cryotherapy;Moist Heat;Neuromuscular re-education;Balance training;Therapeutic exercise;Therapeutic activities;Functional mobility training;Stair training;Gait training;Patient/family education;Manual techniques;Passive range of motion;Taping   Consulted and Agree with Plan of Care Patient      Patient will benefit from skilled therapeutic intervention in order to improve the following deficits and impairments:  Pain, Decreased activity tolerance, Decreased balance, Decreased range of motion,  Decreased mobility, Decreased strength, Difficulty walking, Impaired tone  Visit Diagnosis: Low back pain, unspecified back pain laterality, unspecified chronicity, with sciatica presence unspecified  Abnormal posture     Problem List Patient Active Problem List   Diagnosis Date Noted  . Low back pain 07/21/2016  . BMI 45.0-49.9, adult ([place]) 04/22/2016  . Obesity affecting pregnancy in second trimester 04/22/2016  . Depression 04/22/2016  . Supervision of high risk pregnancy in second trimester 03/25/2016  . Right ovarian cyst affecting pregnancy in second trimester, antepartum 03/25/2016  . Vaginal trichomoniasis in Pregnancy 02/19/2016     Lanney Gins, PT, DPT 08/03/16 6:15 PM  PHYSICAL THERAPY DISCHARGE SUMMARY  Visits from Start of Care: 1  Current functional level related to goals / functional outcomes: See above   Remaining deficits: See above   Education / Equipment: HEP  Plan: Patient agrees to discharge.  Patient goals were met. Patient is being discharged due to financial reasons.  ?????     Lanney Gins, PT, DPT 10/04/16 2:39 PM   [facility] [address]  [place], [place], [address] Phone: [phone]   Fax:  [phone]  Name: Christine Trujillo MRN: [id number] Date of Birth: 09/15/1995

## 2016-08-06 ENCOUNTER — Other Ambulatory Visit: Payer: Self-pay

## 2016-08-13 ENCOUNTER — Ambulatory Visit (INDEPENDENT_AMBULATORY_CARE_PROVIDER_SITE_OTHER): Payer: Medicaid Other | Admitting: Obstetrics & Gynecology

## 2016-08-13 ENCOUNTER — Encounter: Payer: Self-pay | Admitting: Obstetrics & Gynecology

## 2016-08-13 VITALS — BP 125/68 | HR 96 | Wt 276.0 lb

## 2016-08-13 DIAGNOSIS — E669 Obesity, unspecified: Secondary | ICD-10-CM | POA: Diagnosis not present

## 2016-08-13 DIAGNOSIS — O0992 Supervision of high risk pregnancy, unspecified, second trimester: Secondary | ICD-10-CM

## 2016-08-13 DIAGNOSIS — Z6841 Body Mass Index (BMI) 40.0 and over, adult: Secondary | ICD-10-CM

## 2016-08-13 DIAGNOSIS — O99212 Obesity complicating pregnancy, second trimester: Secondary | ICD-10-CM | POA: Diagnosis not present

## 2016-08-13 NOTE — Progress Notes (Signed)
Patient to reschedule 2 hr gtt to Monday. Armandina StammerJennifer Howard RNBSN

## 2016-08-13 NOTE — Progress Notes (Signed)
   PRENATAL VISIT NOTE  Subjective:  Christine Trujillo is a 21 y.o. G1P0 at 348w4d being seen today for ongoing prenatal care.  She is currently monitored for the following issues for this high-risk pregnancy and has Vaginal trichomoniasis in Pregnancy; Supervision of high risk pregnancy in second trimester; Right ovarian cyst affecting pregnancy in second trimester, antepartum; BMI 45.0-49.9, adult (HCC); Obesity affecting pregnancy in second trimester; Depression; and Low back pain on her problem list.  Patient reports no complaints.  Contractions: Not present. Vag. Bleeding: None.  Movement: Present. Denies leaking of fluid.   The following portions of the patient's history were reviewed and updated as appropriate: allergies, current medications, past family history, past medical history, past social history, past surgical history and problem list. Problem list updated.  Objective:   Vitals:   08/13/16 0953  BP: 125/68  Pulse: 96  Weight: 276 lb (125.2 kg)    Fetal Status: Fetal Heart Rate (bpm): 145   Movement: Present     General:  Alert, oriented and cooperative. Patient is in no acute distress.  Skin: Skin is warm and dry. No rash noted.   Cardiovascular: Normal heart rate noted  Respiratory: Normal respiratory effort, no problems with respiration noted  Abdomen: Soft, gravid, appropriate for gestational age. Pain/Pressure: Present     Pelvic:  Cervical exam deferred        Extremities: Normal range of motion.  Edema: None  Mental Status: Normal mood and affect. Normal behavior. Normal judgment and thought content.   Assessment and Plan:  Pregnancy: G1P0 at 778w4d  1. BMI 45.0-49.9, adult (HCC)   2. Obesity affecting pregnancy in second trimester  - US MFM OB FOLLOW UP; Future - Hemoglobin A1c as she doesn't ever show up fasting  3. Supervision of high risk pregnancy in second trimester   Preterm labor symptoms and general obstetric precautions including but not  limited to vaginal bleeding, contractions, leaking of fluid and fetal movement were reviewed in detail with the patient. Please refer to After Visit Summary for other counseling recommendations.  No Follow-up on file.   Allie BossierMyra C Shatasha Lambing, MD

## 2016-08-14 LAB — HEMOGLOBIN A1C
ESTIMATED AVERAGE GLUCOSE: 146 mg/dL
HEMOGLOBIN A1C: 6.7 % — AB (ref 4.8–5.6)

## 2016-08-16 ENCOUNTER — Other Ambulatory Visit: Payer: Self-pay

## 2016-08-18 ENCOUNTER — Telehealth: Payer: Self-pay

## 2016-08-18 NOTE — Telephone Encounter (Signed)
-----   Message from Allie BossierMyra C Dove, MD sent at 08/16/2016 10:58 AM EDT ----- Her HBA1C was elevated, so if she is willing to do a 2 hour GTT, that would be great.

## 2016-08-18 NOTE — Telephone Encounter (Signed)
Attempted to reach patient and voicemail full. Christine StammerJennifer Trujillo RNBSN

## 2016-08-27 ENCOUNTER — Ambulatory Visit (INDEPENDENT_AMBULATORY_CARE_PROVIDER_SITE_OTHER): Payer: Medicaid Other | Admitting: Family Medicine

## 2016-08-27 VITALS — BP 126/62 | HR 98 | Wt 276.0 lb

## 2016-08-27 DIAGNOSIS — O99212 Obesity complicating pregnancy, second trimester: Secondary | ICD-10-CM

## 2016-08-27 DIAGNOSIS — O24415 Gestational diabetes mellitus in pregnancy, controlled by oral hypoglycemic drugs: Secondary | ICD-10-CM | POA: Diagnosis not present

## 2016-08-27 DIAGNOSIS — O0992 Supervision of high risk pregnancy, unspecified, second trimester: Secondary | ICD-10-CM

## 2016-08-27 MED ORDER — GLUCOSE BLOOD VI STRP: ORAL_STRIP | 12 refills | Status: DC

## 2016-08-27 MED ORDER — ACCU-CHEK NANO SMARTVIEW W/DEVICE KIT: 1.0000 | PACK | 0 refills | Status: DC

## 2016-08-27 MED ORDER — METFORMIN HCL 500 MG PO TABS: 500.0000 mg | ORAL_TABLET | Freq: Every day | ORAL | 5 refills | Status: DC

## 2016-08-27 MED ORDER — ACCU-CHEK FASTCLIX LANCETS MISC: 1.0000 [IU] | Freq: Four times a day (QID) | 12 refills | Status: DC

## 2016-08-27 NOTE — Patient Instructions (Signed)
Diabetes Mellitus and Exercise Exercising regularly is important for your overall health, especially when you have diabetes (diabetes mellitus). Exercising is not only about losing weight. It has many health benefits, such as increasing muscle strength and bone density and reducing body fat and stress. This leads to improved fitness, flexibility, and endurance, all of which result in better overall health. Exercise has additional benefits for people with diabetes, including:  Reducing appetite.  Helping to lower and control blood glucose.  Lowering blood pressure.  Helping to control amounts of fatty substances (lipids) in the blood, such as cholesterol and triglycerides.  Helping the body to respond better to insulin (improving insulin sensitivity).  Reducing how much insulin the body needs.  Decreasing the risk for heart disease by:  Lowering cholesterol and triglyceride levels.  Increasing the levels of good cholesterol.  Lowering blood glucose levels. What is my activity plan? Your health care provider or certified diabetes educator can help you make a plan for the type and frequency of exercise (activity plan) that works for you. Make sure that you:  Do at least 150 minutes of moderate-intensity or vigorous-intensity exercise each week. This could be brisk walking, biking, or water aerobics.  Do stretching and strength exercises, such as yoga or weightlifting, at least 2 times a week.  Spread out your activity over at least 3 days of the week.  Get some form of physical activity every day.  Do not go more than 2 days in a row without some kind of physical activity.  Avoid being inactive for more than 90 minutes at a time. Take frequent breaks to walk or stretch.  Choose a type of exercise or activity that you enjoy, and set realistic goals.  Start slowly, and gradually increase the intensity of your exercise over time. What do I need to know about managing my  diabetes?  Check your blood glucose before and after exercising.  If your blood glucose is higher than 240 mg/dL (13.3 mmol/L) before you exercise, check your urine for ketones. If you have ketones in your urine, do not exercise until your blood glucose returns to normal.  Know the symptoms of low blood glucose (hypoglycemia) and how to treat it. Your risk for hypoglycemia increases during and after exercise. Common symptoms of hypoglycemia can include:  Hunger.  Anxiety.  Sweating and feeling clammy.  Confusion.  Dizziness or feeling light-headed.  Increased heart rate or palpitations.  Blurry vision.  Tingling or numbness around the mouth, lips, or tongue.  Tremors or shakes.  Irritability.  Keep a rapid-acting carbohydrate snack available before, during, and after exercise to help prevent or treat hypoglycemia.  Avoid injecting insulin into areas of the body that are going to be exercised. For example, avoid injecting insulin into:  The arms, when playing tennis.  The legs, when jogging.  Keep records of your exercise habits. Doing this can help you and your health care provider adjust your diabetes management plan as needed. Write down:  Food that you eat before and after you exercise.  Blood glucose levels before and after you exercise.  The type and amount of exercise you have done.  When your insulin is expected to peak, if you use insulin. Avoid exercising at times when your insulin is peaking.  When you start a new exercise or activity, work with your health care provider to make sure the activity is safe for you, and to adjust your insulin, medicines, or food intake as needed.  Drink plenty   of water while you exercise to prevent dehydration or heat stroke. Drink enough fluid to keep your urine clear or pale yellow. This information is not intended to replace advice given to you by your health care provider. Make sure you discuss any questions you have with  your health care provider. Document Released: 06/19/2003 Document Revised: 10/17/2015 Document Reviewed: 09/08/2015 Elsevier Interactive Patient Education  2017 Elsevier Inc.  

## 2016-08-27 NOTE — Addendum Note (Signed)
Addended by: Anell BarrHOWARD, JENNIFER L on: 08/27/2016 11:14 AM   Modules accepted: Orders

## 2016-08-27 NOTE — Progress Notes (Signed)
   PRENATAL VISIT NOTE  Subjective:  Christine Trujillo is a 21 y.o. G1P0 at 5164w4d being seen today for ongoing prenatal care.  She is currently monitored for the following issues for this high-risk pregnancy and has Vaginal trichomoniasis in Pregnancy; Supervision of high risk pregnancy in second trimester; Right ovarian cyst affecting pregnancy in second trimester, antepartum; BMI 45.0-49.9, adult (HCC); Obesity affecting pregnancy in second trimester; Depression; and Low back pain on her problem list.  Patient reports no complaints.  Contractions: Not present. Vag. Bleeding: None.  Movement: Present. Denies leaking of fluid.   The following portions of the patient's history were reviewed and updated as appropriate: allergies, current medications, past family history, past medical history, past social history, past surgical history and problem list. Problem list updated.  Objective:   Vitals:   08/27/16 1016  BP: 126/62  Pulse: 98  Weight: 276 lb (125.2 kg)    Fetal Status:     Movement: Present     General:  Alert, oriented and cooperative. Patient is in no acute distress.  Skin: Skin is warm and dry. No rash noted.   Cardiovascular: Normal heart rate noted  Respiratory: Normal respiratory effort, no problems with respiration noted  Abdomen: Soft, gravid, appropriate for gestational age. Pain/Pressure: Present     Pelvic:  Cervical exam deferred        Extremities: Normal range of motion.  Edema: None  Mental Status: Normal mood and affect. Normal behavior. Normal judgment and thought content.   Assessment and Plan:  Pregnancy: G1P0 at 7764w4d  1. Supervision of high risk pregnancy in second trimester FH and FHT normal - Ambulatory Referral to DSME/T  2. Obesity affecting pregnancy in second trimester  3. Gestational diabetes mellitus (GDM) in third trimester controlled on oral hypoglycemic drug Discussed elevated HgA1c - 6.7, which is diagnostic of diabetes. No need for 2h  GTT. Start metformin 500mg  daily. Start CBG testing - discussed when to test: fasting and postprandial. Discussed risks of uncontrolled diabetes in pregnancy: increased Still birth rate, shoulder dystocia with hypoxia and death, brachial plexus injury, cesarean section. Discussed CBG goals. Will need to start NST twice weekly. Will need f/u US for growth at 38 weeks. Induction at 39 weeks. F/u in 1 week.  - US MFM FETAL BPP WO NON STRESS; Future - Ambulatory Referral to DSME/T  Preterm labor symptoms and general obstetric precautions including but not limited to vaginal bleeding, contractions, leaking of fluid and fetal movement were reviewed in detail with the patient. Please refer to After Visit Summary for other counseling recommendations.  Return for NST.   Levie HeritageJacob J Stinson, DO

## 2016-08-28 LAB — CBC
HEMATOCRIT: 29.1 % — AB (ref 34.0–46.6)
HEMOGLOBIN: 9.3 g/dL — AB (ref 11.1–15.9)
MCH: 24 pg — AB (ref 26.6–33.0)
MCHC: 32 g/dL (ref 31.5–35.7)
MCV: 75 fL — AB (ref 79–97)
Platelets: 303 10*3/uL (ref 150–379)
RBC: 3.87 x10E6/uL (ref 3.77–5.28)
RDW: 16.6 % — ABNORMAL HIGH (ref 12.3–15.4)
WBC: 10.6 10*3/uL (ref 3.4–10.8)

## 2016-08-28 LAB — RPR: RPR Ser Ql: NONREACTIVE

## 2016-08-28 LAB — HIV ANTIBODY (ROUTINE TESTING W REFLEX): HIV Screen 4th Generation wRfx: NONREACTIVE

## 2016-08-30 ENCOUNTER — Ambulatory Visit (INDEPENDENT_AMBULATORY_CARE_PROVIDER_SITE_OTHER): Payer: Medicaid Other

## 2016-08-30 ENCOUNTER — Encounter: Payer: Self-pay | Admitting: Family Medicine

## 2016-08-30 DIAGNOSIS — O24415 Gestational diabetes mellitus in pregnancy, controlled by oral hypoglycemic drugs: Secondary | ICD-10-CM | POA: Diagnosis not present

## 2016-08-30 NOTE — Progress Notes (Signed)
NST only visit

## 2016-09-02 ENCOUNTER — Ambulatory Visit (INDEPENDENT_AMBULATORY_CARE_PROVIDER_SITE_OTHER): Payer: Medicaid Other | Admitting: Family Medicine

## 2016-09-02 VITALS — BP 128/75 | HR 98 | Wt 270.0 lb

## 2016-09-02 DIAGNOSIS — O0992 Supervision of high risk pregnancy, unspecified, second trimester: Secondary | ICD-10-CM

## 2016-09-02 DIAGNOSIS — O24415 Gestational diabetes mellitus in pregnancy, controlled by oral hypoglycemic drugs: Secondary | ICD-10-CM | POA: Diagnosis not present

## 2016-09-02 DIAGNOSIS — O99212 Obesity complicating pregnancy, second trimester: Secondary | ICD-10-CM | POA: Diagnosis not present

## 2016-09-02 MED ORDER — GLYBURIDE 2.5 MG PO TABS: 2.5000 mg | ORAL_TABLET | Freq: Two times a day (BID) | ORAL | 3 refills | Status: DC

## 2016-09-02 MED ORDER — METFORMIN HCL ER 500 MG PO TB24: 500.0000 mg | ORAL_TABLET | Freq: Every day | ORAL | 3 refills | Status: DC

## 2016-09-02 NOTE — Progress Notes (Signed)
Subjective:  Christine Trujillo is a 21 y.o. G1P0 at 353w3d being seen today for ongoing prenatal care.  She is currently monitored for the following issues for this high-risk pregnancy and has Vaginal trichomoniasis in Pregnancy; Supervision of high risk pregnancy in second trimester; Right ovarian cyst affecting pregnancy in second trimester, antepartum; BMI 45.0-49.9, adult (HCC); Obesity affecting pregnancy in second trimester; Depression; and Low back pain on her problem list.  GDM: Patient taking metformin 500mg  daily. Reports diarrhea.  Reports no hypoglycemic episodes.  Tolerating medication well. Did not bring log book, but reports: Fasting: between 93-114 2hr PP: mostly 130s  Patient reports no complaints.  Contractions: Not present. Vag. Bleeding: None.  Movement: Present. Denies leaking of fluid.   The following portions of the patient's history were reviewed and updated as appropriate: allergies, current medications, past family history, past medical history, past social history, past surgical history and problem list. Problem list updated.  Objective:   Vitals:   09/02/16 0834  BP: 128/75  Pulse: 98  Weight: 270 lb (122.5 kg)    Fetal Status: Fetal Heart Rate (bpm): NST   Movement: Present     General:  Alert, oriented and cooperative. Patient is in no acute distress.  Skin: Skin is warm and dry. No rash noted.   Cardiovascular: Normal heart rate noted  Respiratory: Normal respiratory effort, no problems with respiration noted  Abdomen: Soft, gravid, appropriate for gestational age. Pain/Pressure: Present     Pelvic: Vag. Bleeding: None     Cervical exam deferred        Extremities: Normal range of motion.  Edema: None  Mental Status: Normal mood and affect. Normal behavior. Normal judgment and thought content.   Urinalysis:      Assessment and Plan:  Pregnancy: G1P0 at 243w3d  1. Supervision of high risk pregnancy in second trimester FHT normal  2. Obesity  affecting pregnancy in second trimester  3. Gestational diabetes mellitus (GDM) in third trimester controlled on oral hypoglycemic drug Change Metformin to XR - take with largest meal. Hopefully, will reduce SE. Start glyburide 2.5mg  BID. Pt to send log book via MyChart.    Preterm labor symptoms and general obstetric precautions including but not limited to vaginal bleeding, contractions, leaking of fluid and fetal movement were reviewed in detail with the patient. Please refer to After Visit Summary for other counseling recommendations.  No Follow-up on file.   Christine HeritageStinson, Jacob J, DO

## 2016-09-03 ENCOUNTER — Encounter: Payer: Self-pay | Admitting: Family Medicine

## 2016-09-03 ENCOUNTER — Ambulatory Visit (HOSPITAL_COMMUNITY)
Admission: RE | Admit: 2016-09-03 | Discharge: 2016-09-03 | Disposition: A | Discharge status: Home / Self Care | Payer: Medicaid Other | Source: Ambulatory Visit | Attending: Family Medicine | Admitting: Family Medicine

## 2016-09-03 DIAGNOSIS — Z6841 Body Mass Index (BMI) 40.0 and over, adult: Secondary | ICD-10-CM | POA: Insufficient documentation

## 2016-09-03 DIAGNOSIS — Z3A33 33 weeks gestation of pregnancy: Secondary | ICD-10-CM | POA: Diagnosis not present

## 2016-09-03 DIAGNOSIS — E669 Obesity, unspecified: Secondary | ICD-10-CM | POA: Insufficient documentation

## 2016-09-03 DIAGNOSIS — O99213 Obesity complicating pregnancy, third trimester: Secondary | ICD-10-CM | POA: Diagnosis not present

## 2016-09-03 DIAGNOSIS — O24415 Gestational diabetes mellitus in pregnancy, controlled by oral hypoglycemic drugs: Secondary | ICD-10-CM | POA: Diagnosis not present

## 2016-09-08 ENCOUNTER — Ambulatory Visit: Payer: Self-pay

## 2016-09-09 ENCOUNTER — Encounter: Payer: Self-pay | Admitting: Family Medicine

## 2016-09-10 ENCOUNTER — Other Ambulatory Visit: Payer: Self-pay | Admitting: Family Medicine

## 2016-09-13 ENCOUNTER — Ambulatory Visit: Payer: Medicaid Other

## 2016-09-13 VITALS — BP 123/81 | HR 94 | Wt 273.0 lb

## 2016-09-13 DIAGNOSIS — O24415 Gestational diabetes mellitus in pregnancy, controlled by oral hypoglycemic drugs: Secondary | ICD-10-CM

## 2016-09-15 ENCOUNTER — Ambulatory Visit: Payer: Self-pay

## 2016-09-16 ENCOUNTER — Emergency Department (HOSPITAL_BASED_OUTPATIENT_CLINIC_OR_DEPARTMENT_OTHER)
Admission: EM | Admit: 2016-09-16 | Discharge: 2016-09-16 | Disposition: A | Discharge status: Home / Self Care | Payer: Medicaid Other | Attending: Emergency Medicine | Admitting: Emergency Medicine

## 2016-09-16 ENCOUNTER — Encounter (HOSPITAL_BASED_OUTPATIENT_CLINIC_OR_DEPARTMENT_OTHER): Payer: Self-pay | Admitting: Emergency Medicine

## 2016-09-16 ENCOUNTER — Ambulatory Visit (INDEPENDENT_AMBULATORY_CARE_PROVIDER_SITE_OTHER): Payer: Medicaid Other | Admitting: Family Medicine

## 2016-09-16 VITALS — BP 132/69 | HR 91 | Wt 269.0 lb

## 2016-09-16 DIAGNOSIS — O0992 Supervision of high risk pregnancy, unspecified, second trimester: Secondary | ICD-10-CM | POA: Diagnosis not present

## 2016-09-16 DIAGNOSIS — O24415 Gestational diabetes mellitus in pregnancy, controlled by oral hypoglycemic drugs: Secondary | ICD-10-CM | POA: Insufficient documentation

## 2016-09-16 DIAGNOSIS — O212 Late vomiting of pregnancy: Secondary | ICD-10-CM | POA: Diagnosis present

## 2016-09-16 DIAGNOSIS — O99212 Obesity complicating pregnancy, second trimester: Secondary | ICD-10-CM | POA: Diagnosis not present

## 2016-09-16 DIAGNOSIS — Z3A35 35 weeks gestation of pregnancy: Secondary | ICD-10-CM | POA: Diagnosis not present

## 2016-09-16 DIAGNOSIS — N83209 Unspecified ovarian cyst, unspecified side: Secondary | ICD-10-CM

## 2016-09-16 DIAGNOSIS — O3482 Maternal care for other abnormalities of pelvic organs, second trimester: Secondary | ICD-10-CM

## 2016-09-16 DIAGNOSIS — R112 Nausea with vomiting, unspecified: Secondary | ICD-10-CM

## 2016-09-16 DIAGNOSIS — Z79899 Other long term (current) drug therapy: Secondary | ICD-10-CM | POA: Diagnosis not present

## 2016-09-16 HISTORY — DX: Type 2 diabetes mellitus without complications: E11.9

## 2016-09-16 LAB — BASIC METABOLIC PANEL
ANION GAP: 8 (ref 5–15)
BUN: 6 mg/dL (ref 6–20)
CO2: 21 mmol/L — ABNORMAL LOW (ref 22–32)
Calcium: 8.9 mg/dL (ref 8.9–10.3)
Chloride: 105 mmol/L (ref 101–111)
Creatinine, Ser: 0.52 mg/dL (ref 0.44–1.00)
GFR calc Af Amer: >60 mL/min (ref >60)
GLUCOSE: 79 mg/dL (ref 65–99)
POTASSIUM: 3.2 mmol/L — AB (ref 3.5–5.1)
Sodium: 134 mmol/L — ABNORMAL LOW (ref 135–145)

## 2016-09-16 LAB — URINALYSIS, ROUTINE W REFLEX MICROSCOPIC
Bilirubin Urine: NEGATIVE
GLUCOSE, UA: NEGATIVE mg/dL
Hgb urine dipstick: NEGATIVE
Ketones, ur: 15 mg/dL — AB
Nitrite: NEGATIVE
PH: 7 (ref 5.0–8.0)
PROTEIN: NEGATIVE mg/dL
Specific Gravity, Urine: 1.018 (ref 1.005–1.030)

## 2016-09-16 LAB — CBC WITH DIFFERENTIAL/PLATELET
BASOS ABS: 0 10*3/uL (ref 0.0–0.1)
Basophils Relative: 0 %
Eosinophils Absolute: 0.1 10*3/uL (ref 0.0–0.7)
Eosinophils Relative: 1 %
HEMATOCRIT: 28.2 % — AB (ref 36.0–46.0)
HEMOGLOBIN: 9 g/dL — AB (ref 12.0–15.0)
LYMPHS PCT: 21 %
Lymphs Abs: 1.7 10*3/uL (ref 0.7–4.0)
MCH: 24.3 pg — ABNORMAL LOW (ref 26.0–34.0)
MCHC: 31.9 g/dL (ref 30.0–36.0)
MCV: 76 fL — AB (ref 78.0–100.0)
Monocytes Absolute: 1.2 10*3/uL — ABNORMAL HIGH (ref 0.1–1.0)
Monocytes Relative: 14 %
NEUTROS ABS: 5.3 10*3/uL (ref 1.7–7.7)
Neutrophils Relative %: 64 %
Platelets: 278 10*3/uL (ref 150–400)
RBC: 3.71 MIL/uL — ABNORMAL LOW (ref 3.87–5.11)
RDW: 16.8 % — ABNORMAL HIGH (ref 11.5–15.5)
WBC: 8.2 10*3/uL (ref 4.0–10.5)

## 2016-09-16 LAB — URINALYSIS, MICROSCOPIC (REFLEX)

## 2016-09-16 LAB — CBG MONITORING, ED: Glucose-Capillary: 99 mg/dL (ref 65–99)

## 2016-09-16 MED ORDER — SODIUM CHLORIDE 0.9 % IV BOLUS (SEPSIS)
1000.0000 mL | Freq: Once | INTRAVENOUS | Status: AC
  Administered 2016-09-16: 1000 mL via INTRAVENOUS

## 2016-09-16 MED ORDER — ONDANSETRON 4 MG PO TBDP: 4.0000 mg | ORAL_TABLET | Freq: Three times a day (TID) | ORAL | 1 refills | Status: DC | PRN

## 2016-09-16 MED ORDER — SODIUM CHLORIDE 0.9 % IV SOLN: INTRAVENOUS | Status: DC

## 2016-09-16 MED ORDER — PROMETHAZINE HCL 25 MG PO TABS: 25.0000 mg | ORAL_TABLET | Freq: Four times a day (QID) | ORAL | 1 refills | Status: DC | PRN

## 2016-09-16 NOTE — ED Notes (Signed)
Spoke with Denny PeonErin, RN rapid response. Per Dr. Shawnie PonsPratt they would like a spec exam and if it shows blood then pt would need to be transferred to women's. Dr. Deretha EmoryZackowski made aware and given contact # for Dr. Shawnie PonsPratt.

## 2016-09-16 NOTE — Discharge Instructions (Signed)
Follow-up with your OB/GYN give him a call in the morning. Today's workup without any significant findings. [facility]. Also no evidence of vaginal blood. Urinalysis negative. Blood sugars been stable. Take your nausea medicine that you have at home.

## 2016-09-16 NOTE — ED Notes (Addendum)
Called [place] from [place], RN OB rapid response. Per Dr. Shawnie PonsPratt fetal monitor can be d/c'ed and pt is okay to discharge home and f/u with her doctor. Dr. Deretha EmoryZackowski updated. Vorb to d/c fetal monitor

## 2016-09-16 NOTE — Progress Notes (Signed)
1749  Received call about this 21 yo G1P0 @ 35.[redacted] wks GA in with report of vomiting X 1 after taking glyburide today and reports brownish vaginal discharge upon wiping after vomiting episode.  [facility] for [facility] and prenatal visit.  [id number] Dr. Shawnie PonsPratt notified of pt in ED and of above.  She requests SSE by EDP.  She recommends that if vaginal blood, old or new, is seen on SSE then pt should be transferred to [facility].  ED RN notified.  [email or web address] ED RN states no blood seen on SSE.  1850 Dr. Shawnie PonsPratt states pt is OB cleared and can be discharged home to follow up as needed for problems or signs of labor. ED RN notified.

## 2016-09-16 NOTE — Progress Notes (Signed)
Subjective:  Tobi BastosCheyenne Adkison is a 21 y.o. G1P0 at 9932w3d being seen today for ongoing prenatal care.  She is currently monitored for the following issues for this high-risk pregnancy and has Vaginal trichomoniasis in Pregnancy; Supervision of high risk pregnancy in second trimester; Right ovarian cyst affecting pregnancy in second trimester, antepartum; BMI 45.0-49.9, adult (HCC); Obesity affecting pregnancy in second trimester; Depression; Low back pain; and Gestational diabetes mellitus (GDM) in third trimester controlled on oral hypoglycemic drug on her problem list.  GDM: Patient taking Metformin XR 500mg  daily and Glyburide 2.5mg  BID.  Reports 2 hypoglycemic episodes.  She continues to have GI upset with metformin. Did not bring logbook, but meter reviewed. Not checking every single day - there are numbers from 6/3, 6/6, 6/7. She states that she checked the other days and wrote down the numbers.  Fasting: slightly elevated 2hr PP: had low blood sugar last night (70s). CBGs 110-130s otherwise. Not following diet.  Patient reports no complaints.  Contractions: Not present. Vag. Bleeding: None.  Movement: Present. Denies leaking of fluid.   The following portions of the patient's history were reviewed and updated as appropriate: allergies, current medications, past family history, past medical history, past social history, past surgical history and problem list. Problem list updated.  Objective:   Vitals:   09/16/16 1036  BP: 132/69  Pulse: 91  Weight: 269 lb (122 kg)    Fetal Status:     Movement: Present     General:  Alert, oriented and cooperative. Patient is in no acute distress.  Skin: Skin is warm and dry. No rash noted.   Cardiovascular: Normal heart rate noted  Respiratory: Normal respiratory effort, no problems with respiration noted  Abdomen: Soft, gravid, appropriate for gestational age. Pain/Pressure: Present     Pelvic: Vag. Bleeding: None Vag D/C Character: Thin    Cervical exam deferred        Extremities: Normal range of motion.  Edema: Trace  Mental Status: Normal mood and affect. Normal behavior. Normal judgment and thought content.   Urinalysis:      Assessment and Plan:  Pregnancy: G1P0 at 6932w3d  1. Supervision of high risk pregnancy in second trimester FHT and FH normal  2. Obesity affecting pregnancy in second trimester  3. Gestational diabetes mellitus (GDM) in third trimester controlled on oral hypoglycemic drug NST twice weekly. Stop metformin.  Reviewed diabetic diet Emphasized checking CBGs regularly. Will bring log book on Monday - if still elevated, will need to increase glyburide.   Preterm labor symptoms and general obstetric precautions including but not limited to vaginal bleeding, contractions, leaking of fluid and fetal movement were reviewed in detail with the patient. Please refer to After Visit Summary for other counseling recommendations.  No Follow-up on file.   Levie HeritageStinson, Tawania Daponte J, DO

## 2016-09-16 NOTE — ED Notes (Signed)
Spoke with Denny PeonErin, RN OB rapid response. Confirmed she is able to see baby on monitor. Will monitor

## 2016-09-16 NOTE — ED Notes (Addendum)
Discussed with pt importance of staying hydrated and to drink enough water to keep urine pale yellow and without strong odor.[facility]. Pt verbalized understanding.

## 2016-09-16 NOTE — ED Provider Notes (Signed)
[facility] Provider Note   CSN: [id number] Arrival date & time: 09/16/16  1733     History   Chief Complaint Chief Complaint  Patient presents with  . Emesis During Pregnancy    HPI Christine Trujillo is a 21 y.o. female.  Patient is [redacted] weeks pregnant. Due date is July 8 but they're planning on inducing her July 2. Patient was evaluated by OB this morning. They did do fetal monitoring. Should they stated she was having some small contractions but nothing concerning. Patient's been having will difficulty with her blood sugars running a little low so they stopped her metformin. Patient when she got home became very nausea 80. She had one episode of vomiting patient also home blood she's had some dark blood when she was seen when she wiped her self. Concerned about vaginal bleeding. Patient does say she is having some abdominal discomfort. Patient blood sugar of what is low at home but not below 40. Blood sugar upon arrival here was 99.      Past Medical History:  Diagnosis Date  . Diabetes mellitus without complication ([place])   . Headache   . Hypertension   . Migraine   . Obesity   . Paratubal cyst 02/19/2016    Patient Active Problem List   Diagnosis Date Noted  . Gestational diabetes mellitus (GDM) in third trimester controlled on oral hypoglycemic drug 09/02/2016  . Low back pain 07/21/2016  . BMI 45.0-49.9, adult ([place]) 04/22/2016  . Obesity affecting pregnancy in second trimester 04/22/2016  . Depression 04/22/2016  . Supervision of high risk pregnancy in second trimester 03/25/2016  . Right ovarian cyst affecting pregnancy in second trimester, antepartum 03/25/2016  . Vaginal trichomoniasis in Pregnancy 02/19/2016    Past Surgical History:  Procedure Laterality Date  . WISDOM TOOTH EXTRACTION      OB History    Gravida Para Term Preterm AB Living   1         0   SAB TAB Ectopic Multiple Live Births                   Home Medications    Prior  to Admission medications   Medication Sig Start Date End Date Taking? Authorizing Provider  ACCU-CHEK FASTCLIX LANCETS MISC 1 Units by Percutaneous route 4 (four) times daily. 08/27/16   Truett Mainland, DO  Blood Glucose Monitoring Suppl (ACCU-CHEK NANO SMARTVIEW) w/Device KIT 1 kit by Subdermal route as directed. Check blood sugars for fasting, and two hours after breakfast, lunch and dinner (4 checks daily) 08/27/16   Truett Mainland, DO  cetirizine (ZYRTEC) 10 MG tablet Take 1 tablet (10 mg total) by mouth daily. 06/18/16   Donnamae Jude, MD  cyclobenzaprine (FLEXERIL) 10 MG tablet Take 1 tablet (10 mg total) by mouth every 8 (eight) hours as needed for muscle spasms. 07/29/16   Truett Mainland, DO  Doxylamine-Pyridoxine (DICLEGIS) 10-10 MG TBEC Take 1 tablet by mouth 4 (four) times daily as needed. 06/18/16   Donnamae Jude, MD  glucose blood (ACCU-CHEK SMARTVIEW) test strip Use as instructed to check blood sugars 08/27/16   Truett Mainland, DO  glyBURIDE (DIABETA) 2.5 MG tablet Take 1 tablet (2.5 mg total) by mouth 2 (two) times daily with a meal. 09/02/16   Truett Mainland, DO  metFORMIN (GLUCOPHAGE XR) 500 MG 24 hr tablet Take 1 tablet (500 mg total) by mouth daily. 09/02/16   Truett Mainland, DO  ondansetron (  ZOFRAN ODT) 4 MG disintegrating tablet Take 1 tablet (4 mg total) by mouth every 6 (six) hours as needed for nausea. 07/29/16   Truett Mainland, DO  polyethylene glycol [facility]) packet Take 17 g by mouth daily. 05/10/16   Donnamae Jude, MD  Prenat w/o A Vit-FeFum-FePo-FA (CONCEPT OB) 130-92.4-1 MG CAPS Take 1 tablet by mouth daily. 06/09/16   Truett Mainland, DO  promethazine (PHENERGAN) 25 MG tablet Take 25 mg by mouth every 6 (six) hours as needed for nausea or vomiting.    [provider]  sertraline (ZOLOFT) 50 MG tablet Take 1 tablet (50 mg total) by mouth daily. 06/18/16   Donnamae Jude, MD    Family History Family History  Problem Relation Age of Onset  . Heart disease  Mother     Social History Social History  Substance Use Topics  . Smoking status: Never Smoker  . Smokeless tobacco: Never Used  . Alcohol use No     Allergies   Patient has no known allergies.   Review of Systems Review of Systems  Constitutional: Negative for fever.  HENT: Negative for congestion.   Eyes: Negative for redness and visual disturbance.  Respiratory: Negative for shortness of breath.   Cardiovascular: Negative for chest pain.  Gastrointestinal: Positive for abdominal pain.  Genitourinary: Positive for vaginal bleeding. Negative for dysuria.  Musculoskeletal: Negative for back pain.  Skin: Negative for rash.  Neurological: Negative for headaches.  Hematological: Does not bruise/bleed easily.  Psychiatric/Behavioral: Negative for confusion.     Physical Exam Updated Vital Signs BP 135/75   Pulse 87   Temp 98.9 F (37.2 C) (Oral)   Resp 18   Wt 122 kg (269 lb)   LMP 01/12/2016 Comment: home UPT x2 positive  SpO2 98%   BMI 47.65 kg/m   Physical Exam  Constitutional: She is oriented to person, place, and time. She appears well-developed and well-nourished. No distress.  HENT:  Head: Normocephalic and atraumatic.  Mouth/Throat: Oropharynx is clear and moist.  Eyes: Conjunctivae and EOM are normal. Pupils are equal, round, and reactive to light.  Neck: Normal range of motion. Neck supple.  Cardiovascular: Normal rate, regular rhythm and normal heart sounds.   Pulmonary/Chest: Effort normal and breath sounds normal. No respiratory distress.  Abdominal: Soft. Bowel sounds are normal.  Gravid greater than 20 weeks. Good fetal heart tones.  Musculoskeletal: Normal range of motion.  Trace edema  Neurological: She is alert and oriented to person, place, and time.  Skin: Skin is warm.  Nursing note and vitals reviewed.    ED Treatments / Results  Labs (all labs ordered are listed, but only abnormal results are displayed) Labs Reviewed  CBC WITH  DIFFERENTIAL/PLATELET - Abnormal; Notable for the following:       Result Value   RBC 3.71 (*)    Hemoglobin 9.0 (*)    HCT 28.2 (*)    MCV 76.0 (*)    MCH 24.3 (*)    RDW 16.8 (*)    Monocytes Absolute 1.2 (*)    All other components within normal limits  BASIC METABOLIC PANEL - Abnormal; Notable for the following:    Sodium 134 (*)    Potassium 3.2 (*)    CO2 21 (*)    All other components within normal limits  URINALYSIS, ROUTINE W REFLEX MICROSCOPIC - Abnormal; Notable for the following:    Color, Urine AMBER (*)    Ketones, ur 15 (*)  Leukocytes, UA SMALL (*)    All other components within normal limits  URINALYSIS, MICROSCOPIC (REFLEX) - Abnormal; Notable for the following:    Bacteria, UA FEW (*)    Squamous Epithelial / LPF 0-5 (*)    All other components within normal limits  CBG MONITORING, ED    EKG  EKG Interpretation None       Radiology No results found.  Procedures Procedures (including critical care time)  Medications Ordered in ED Medications  0.9 %  sodium chloride infusion (not administered)  sodium chloride 0.9 % bolus 1,000 mL (0 mLs Intravenous Stopped 09/16/16 1934)     Initial Impression / Assessment and Plan / ED Course  I have reviewed the triage vital signs and the nursing notes.  Pertinent labs & imaging results that were available during my care of the patient were reviewed by me and considered in my medical decision making (see chart for details).    The patient with fetal monitoring here no evidence of any significant contractions. There was some subtle contractions but they sell down with IV hydration. Patient's vital signs as well as her electrolytes without any significant abnormalities. Urinalysis negative for urinary tract infection. Sterile speculum exam was done no evidence of blood in the vaginal vault. Patient states that she had some brown blood at home. Discussion between nurse here and [facility] as well as Dr.  Kennon Rounds. Patient is cleared and stable for discharge home and follow-up with her OB/GYN. Patient instructed that in the future any particular problems [facility].   Final Clinical Impressions(s) / ED Diagnoses   Final diagnoses:  [redacted] weeks gestation of pregnancy  Non-intractable vomiting with nausea, unspecified vomiting type    New Prescriptions New Prescriptions   No medications on file     Fredia Sorrow, MD 09/16/16 [phone]

## 2016-09-16 NOTE — ED Notes (Signed)
CBG- 99 

## 2016-09-16 NOTE — ED Triage Notes (Signed)
Pt sent from  OB upstairs. Pt [redacted] weeks pregnant. Pt has gestational diabetes. C/o vomiting today, states she feels bad. Also noted to be hypertensive in the office today.

## 2016-09-20 ENCOUNTER — Ambulatory Visit (INDEPENDENT_AMBULATORY_CARE_PROVIDER_SITE_OTHER): Payer: Medicaid Other | Admitting: *Deleted

## 2016-09-20 VITALS — Wt 269.0 lb

## 2016-09-20 DIAGNOSIS — O24415 Gestational diabetes mellitus in pregnancy, controlled by oral hypoglycemic drugs: Secondary | ICD-10-CM

## 2016-09-21 ENCOUNTER — Other Ambulatory Visit: Payer: Self-pay

## 2016-09-23 ENCOUNTER — Ambulatory Visit (INDEPENDENT_AMBULATORY_CARE_PROVIDER_SITE_OTHER): Payer: Medicaid Other | Admitting: Obstetrics & Gynecology

## 2016-09-23 ENCOUNTER — Other Ambulatory Visit: Payer: Self-pay | Admitting: Obstetrics & Gynecology

## 2016-09-23 ENCOUNTER — Other Ambulatory Visit (HOSPITAL_COMMUNITY)
Admission: RE | Admit: 2016-09-23 | Discharge: 2016-09-23 | Disposition: A | Discharge status: Home / Self Care | Payer: Medicaid Other | Source: Ambulatory Visit | Attending: Obstetrics & Gynecology | Admitting: Obstetrics & Gynecology

## 2016-09-23 VITALS — BP 120/73 | HR 102 | Wt 270.0 lb

## 2016-09-23 DIAGNOSIS — O24415 Gestational diabetes mellitus in pregnancy, controlled by oral hypoglycemic drugs: Secondary | ICD-10-CM | POA: Insufficient documentation

## 2016-09-23 DIAGNOSIS — O3482 Maternal care for other abnormalities of pelvic organs, second trimester: Secondary | ICD-10-CM

## 2016-09-23 DIAGNOSIS — Z3A36 36 weeks gestation of pregnancy: Secondary | ICD-10-CM | POA: Insufficient documentation

## 2016-09-23 DIAGNOSIS — O99213 Obesity complicating pregnancy, third trimester: Secondary | ICD-10-CM | POA: Diagnosis not present

## 2016-09-23 DIAGNOSIS — O98313 Other infections with a predominantly sexual mode of transmission complicating pregnancy, third trimester: Secondary | ICD-10-CM | POA: Insufficient documentation

## 2016-09-23 DIAGNOSIS — O0992 Supervision of high risk pregnancy, unspecified, second trimester: Secondary | ICD-10-CM | POA: Diagnosis not present

## 2016-09-23 DIAGNOSIS — O99212 Obesity complicating pregnancy, second trimester: Secondary | ICD-10-CM

## 2016-09-23 DIAGNOSIS — O3483 Maternal care for other abnormalities of pelvic organs, third trimester: Secondary | ICD-10-CM | POA: Insufficient documentation

## 2016-09-23 DIAGNOSIS — O0993 Supervision of high risk pregnancy, unspecified, third trimester: Secondary | ICD-10-CM | POA: Diagnosis not present

## 2016-09-23 DIAGNOSIS — A5901 Trichomonal vulvovaginitis: Secondary | ICD-10-CM

## 2016-09-23 DIAGNOSIS — Z6841 Body Mass Index (BMI) 40.0 and over, adult: Secondary | ICD-10-CM | POA: Diagnosis not present

## 2016-09-23 DIAGNOSIS — N83209 Unspecified ovarian cyst, unspecified side: Secondary | ICD-10-CM

## 2016-09-23 LAB — OB RESULTS CONSOLE GC/CHLAMYDIA: Gonorrhea: NEGATIVE

## 2016-09-23 NOTE — Progress Notes (Signed)
Pt would like to be tested for a yeast infection

## 2016-09-24 ENCOUNTER — Encounter: Payer: Self-pay | Admitting: Obstetrics & Gynecology

## 2016-09-24 NOTE — Progress Notes (Signed)
   PRENATAL VISIT NOTE  Subjective:  Christine Trujillo is a 21 y.o. G1P0 at 7656w4d being seen today for ongoing prenatal care.  She is currently monitored for the following issues for this high-risk pregnancy and has Vaginal trichomoniasis in Pregnancy; Supervision of high risk pregnancy in second trimester; Right ovarian cyst affecting pregnancy in second trimester, antepartum; BMI 45.0-49.9, adult (HCC); Obesity affecting pregnancy in second trimester; Depression; Low back pain; and Gestational diabetes mellitus (GDM) in third trimester controlled on oral hypoglycemic drug on her problem list.  Patient reports vaginal dischare and irritation.  Contractions: Irregular. Vag. Bleeding: None.  Movement: Present. Denies leaking of fluid.   The following portions of the patient's history were reviewed and updated as appropriate: allergies, current medications, past family history, past medical history, past social history, past surgical history and problem list. Problem list updated.  Objective:   Vitals:   09/23/16 1120  BP: 120/73  Pulse: (!) 102  Weight: 270 lb (122.5 kg)    Fetal Status: Fetal Heart Rate (bpm): 156   Movement: Present     General:  Alert, oriented and cooperative. Patient is in no acute distress.  Skin: Skin is warm and dry. No rash noted.   Cardiovascular: Normal heart rate noted  Respiratory: Normal respiratory effort, no problems with respiration noted  Abdomen: Soft, gravid, appropriate for gestational age. Pain/Pressure: Present     Pelvic:  Cervical exam performed        Extremities: Normal range of motion.  Edema: Trace  Mental Status: Normal mood and affect. Normal behavior. Normal judgment and thought content.   Assessment and Plan:  Pregnancy: G1P0 at 6956w4d  1. Gestational diabetes mellitus (GDM) in third trimester controlled on oral hypoglycemic drug  - US MFM OB FOLLOW UP; Future Glucose Not well controlled. reviewed log with pt   2. Supervision of  high risk pregnancy in second trimester  3. Right ovarian cyst affecting pregnancy in second trimester, antepartum Only needs eval prior to del IF she becomes symptomatic.  4. Vaginal trichomoniasis in Pregnancy Wet mount and KOH sent GBS sent  5. Obesity affecting pregnancy in second trimester  6. BMI 45.0-49.9, adult (HCC)  Preterm labor symptoms and general obstetric precautions including but not limited to vaginal bleeding, contractions, leaking of fluid and fetal movement were reviewed in detail with the patient. Please refer to After Visit Summary for other counseling recommendations.  No Follow-up on file.   Willodean Rosenthalarolyn Harraway-Smith, MD

## 2016-09-25 ENCOUNTER — Other Ambulatory Visit: Payer: Self-pay | Admitting: Family Medicine

## 2016-09-25 DIAGNOSIS — K5901 Slow transit constipation: Secondary | ICD-10-CM

## 2016-09-27 ENCOUNTER — Ambulatory Visit (INDEPENDENT_AMBULATORY_CARE_PROVIDER_SITE_OTHER): Payer: Medicaid Other

## 2016-09-27 ENCOUNTER — Telehealth (HOSPITAL_COMMUNITY): Payer: Self-pay | Admitting: *Deleted

## 2016-09-27 VITALS — BP 127/79 | HR 106

## 2016-09-27 DIAGNOSIS — O24415 Gestational diabetes mellitus in pregnancy, controlled by oral hypoglycemic drugs: Secondary | ICD-10-CM

## 2016-09-27 DIAGNOSIS — O0993 Supervision of high risk pregnancy, unspecified, third trimester: Secondary | ICD-10-CM

## 2016-09-27 LAB — GC/CHLAMYDIA PROBE AMP (~~LOC~~) NOT AT ARMC
Chlamydia: NEGATIVE
NEISSERIA GONORRHEA: NEGATIVE

## 2016-09-27 MED ORDER — POLYETHYLENE GLYCOL 3350 17 G PO PACK: 17.0000 g | PACK | Freq: Every day | ORAL | 0 refills | Status: DC

## 2016-09-27 NOTE — Progress Notes (Signed)
NST ONLY VISIT. Armandina StammerJennifer Howard RNBSN

## 2016-09-27 NOTE — Addendum Note (Signed)
Addended by: Anell BarrHOWARD, Jarrod Bodkins L on: 09/27/2016 08:34 AM   Modules accepted: Orders

## 2016-09-27 NOTE — Telephone Encounter (Signed)
Preadmission screen  

## 2016-09-28 ENCOUNTER — Other Ambulatory Visit: Payer: Self-pay

## 2016-09-28 LAB — OB RESULTS CONSOLE GBS: STREP GROUP B AG: NEGATIVE

## 2016-09-28 LAB — CULTURE, BETA STREP (GROUP B ONLY): STREP GP B CULTURE: NEGATIVE

## 2016-09-29 ENCOUNTER — Telehealth (HOSPITAL_COMMUNITY): Payer: Self-pay | Admitting: *Deleted

## 2016-09-29 NOTE — Telephone Encounter (Signed)
Preadmission screen  

## 2016-09-30 ENCOUNTER — Ambulatory Visit (INDEPENDENT_AMBULATORY_CARE_PROVIDER_SITE_OTHER): Payer: Medicaid Other | Admitting: Family Medicine

## 2016-09-30 ENCOUNTER — Telehealth (HOSPITAL_COMMUNITY): Payer: Self-pay | Admitting: *Deleted

## 2016-09-30 ENCOUNTER — Encounter (HOSPITAL_COMMUNITY): Payer: Self-pay | Admitting: *Deleted

## 2016-09-30 VITALS — BP 114/63 | HR 85 | Wt 267.0 lb

## 2016-09-30 DIAGNOSIS — O0992 Supervision of high risk pregnancy, unspecified, second trimester: Secondary | ICD-10-CM

## 2016-09-30 DIAGNOSIS — O0993 Supervision of high risk pregnancy, unspecified, third trimester: Secondary | ICD-10-CM | POA: Diagnosis not present

## 2016-09-30 DIAGNOSIS — O24415 Gestational diabetes mellitus in pregnancy, controlled by oral hypoglycemic drugs: Secondary | ICD-10-CM | POA: Diagnosis not present

## 2016-09-30 MED ORDER — GLYBURIDE 5 MG PO TABS: 5.0000 mg | ORAL_TABLET | Freq: Two times a day (BID) | ORAL | 1 refills | Status: DC

## 2016-09-30 NOTE — Telephone Encounter (Signed)
Preadmission screen  

## 2016-09-30 NOTE — Progress Notes (Signed)
    PRENATAL VISIT NOTE  Subjective:  Christine Trujillo is a 21 y.o. G1P0 at 5526w3d being seen today for ongoing prenatal care.  She is currently monitored for the following issues for this high-risk pregnancy and has Supervision of high risk pregnancy in second trimester; Right ovarian cyst affecting pregnancy in second trimester, antepartum; BMI 45.0-49.9, adult (HCC); Obesity affecting pregnancy in second trimester; Depression; Low back pain; and Gestational diabetes mellitus (GDM) in third trimester controlled on oral hypoglycemic drug on her problem list.  Patient reports no complaints.  Contractions: Irritability. Vag. Bleeding: None.  Movement: Present. Denies leaking of fluid.   The following portions of the patient's history were reviewed and updated as appropriate: allergies, current medications, past family history, past medical history, past social history, past surgical history and problem list. Problem list updated.  Objective:   Vitals:   09/30/16 1039  BP: 114/63  Pulse: 85  Weight: 267 lb (121.1 kg)    Fetal Status: Fetal Heart Rate (bpm): nst   Movement: Present     General:  Alert, oriented and cooperative. Patient is in no acute distress.  Skin: Skin is warm and dry. No rash noted.   Cardiovascular: Normal heart rate noted  Respiratory: Normal respiratory effort, no problems with respiration noted  Abdomen: Soft, gravid, appropriate for gestational age. Pain/Pressure: Present     Pelvic:  Cervical exam deferred        Extremities: Normal range of motion.  Edema: Trace  Mental Status: Normal mood and affect. Normal behavior. Normal judgment and thought content.  NST:  Baseline: 140 bpm, Variability: Good {> 6 bpm), Accelerations: Reactive and Decelerations: Absent  No book Reports fasting 80-114 2 hour pp 150-180 Assessment and Plan:  Pregnancy: G1P0 at 4026w3d  1. Gestational diabetes mellitus (GDM) in third trimester controlled on oral hypoglycemic drug CBG  are not well controlled--increased her Glyburide to 5 mg bid U/s for growh scheduled for tomorrow - glyBURIDE (DIABETA) 5 MG tablet; Take 1 tablet (5 mg total) by mouth 2 (two) times daily with a meal.  Dispense: 60 tablet; Refill: 1  2. Supervision of high risk pregnancy in second trimester Awaiting GBS it is at lab and being run  Term labor symptoms and general obstetric precautions including but not limited to vaginal bleeding, contractions, leaking of fluid and fetal movement were reviewed in detail with the patient. Please refer to After Visit Summary for other counseling recommendations.  Return in 1 week (on 10/07/2016) for NST only in 4 days, OB visit and NST.   Reva Boresanya S Pratt, MD

## 2016-09-30 NOTE — Patient Instructions (Signed)
Gestational Diabetes Mellitus, Diagnosis  Gestational diabetes (gestational diabetes mellitus) is a temporary form of diabetes that some women develop during pregnancy. It usually occurs around weeks 24-28 of pregnancy and goes away after delivery. Hormonal changes during pregnancy can interfere with insulin production and function, which may result in one or both of these problems:   The pancreas does not make enough of a hormone called insulin.   Cells in the body do not respond properly to insulin that the body makes (insulin resistance).    Normally, insulin allows sugars (glucose) to enter cells in the body. The cells use glucose for energy. Insulin resistance or lack of insulin causes excess glucose to build up in the blood instead of going into cells. As a result, high blood glucose (hyperglycemia) develops.  What are the risks?  If gestational diabetes is treated, it is unlikely to cause problems. If it is not controlled with treatment, it may cause problems during labor and delivery, and some of those problems can be harmful to the unborn baby (fetus) and the mother. Uncontrolled gestational diabetes may also cause the newborn baby to have breathing problems and low blood glucose.  Women who get gestational diabetes are more likely to develop it if they get pregnant again, and they are more likely to develop type 2 diabetes in the future.  What increases the risk?  This condition may be more likely to develop in pregnant women who:   Are older than age 25 during pregnancy.   Have a family history of diabetes.   Are overweight.   Had gestational diabetes in the past.   Have polycystic ovarian syndrome (PCOS).   Are pregnant with twins or multiples.   Are of American-Indian, African-American, Hispanic/Latino, or Asian/Pacific Islander descent.    What are the signs or symptoms?  Most women do not notice symptoms of gestational diabetes because the symptoms are similar to normal symptoms of pregnancy.  Symptoms of gestational diabetes may include:   Increased thirst (polydipsia).   Increased hunger(polyphagia).   Increased urination (polyuria).    How is this diagnosed?    This condition may be diagnosed based on your blood glucose level, which may be checked with one or more of the following blood tests:   A fasting blood glucose (FBG) test. You will not be allowed to eat (you will fast) for at least 8 hours before a blood sample is taken.   A random blood glucose test. This checks your blood glucose at any time of day regardless of when you ate.   An oral glucose tolerance test (OGTT). This is usually done during weeks 24-28 of pregnancy.  ? For this test, you will have an FBG test done. Then, you will drink a beverage that contains glucose. Your blood glucose will be tested again 1 hour after drinking the glucose beverage (1-hour OGTT).  ? If the 1-hour OGTT result is at or above 140 mg/dL (7.8 mmol/L), you will repeat the OGTT. This time, your blood glucose will be tested 3 hours after drinking the glucose beverage (3-hour OGTT).    If you have risk factors, you may be screened for undiagnosed type 2 diabetes at your first health care visit during your pregnancy (prenatal visit).  How is this treated?    Your treatment may be managed by a specialist called an endocrinologist. This condition is treated by following instructions from your health care provider about:   Eating a healthier diet and getting more physical activity.   are needed. ? If you use insulin, you may need to adjust your dosage based on how physically active you are and what foods you eat. Your health care provider will tell you how to do this.  Your health care provider will set treatment goals for you  based on the stage of your pregnancy and any other medical conditions you have. Generally, the goal of treatment is to maintain the following blood glucose levels during pregnancy:  Fasting: at or below 95 mg/dL (5.3 mmol/L).  After meals (postprandial): ? One hour after a meal: at or below 140 mg/dL (7.8 mmol/L). ? Two hours after a meal: at or below 120 mg/dL (6.7 mmol/L).  A1c (hemoglobin A1c) level: 6-6.5%.  Follow these instructions at home:  Take over-the-counter and prescription medicines only as told by your health care provider.  Manage your weight gain during pregnancy. The amount of weight that you are expected to gain depends on your pre-pregnancy BMI (body mass index).  Keep all follow-up visits as told by your health care provider. This is important. Consider asking your health care provider these questions:   Do I need to meet with a diabetes educator?  Where can I find a support group for people with diabetes?  What equipment will I need to manage my diabetes at home?  What diabetes medicines do I need, and when should I take them?  How often do I need to check my blood glucose?  What number can I call if I have questions?  When is my next appointment? Where to find more information:  For more information about diabetes, visit: ? American Diabetes Association (ADA): www.diabetes.org ? American Association of Diabetes Educators (AADE): www.diabeteseducator.org/patient-resources Contact a health care provider if:  Your blood glucose level is at or above 240 mg/dL (16.1 mmol/L).  Your blood glucose level is at or above 200 mg/dL (09.6 mmol/L) and you have ketones in your urine.  You have been sick or have had a fever for 2 days or more and you are not getting better.  You have any of the following problems for more than 6 hours: ? You cannot eat or drink. ? You have nausea and vomiting. ? You have diarrhea. Get help right away if:  Your blood glucose  is below 54 mg/dL (3 mmol/L).  You become confused or you have trouble thinking clearly.  You have difficulty breathing.  You have moderate or large ketone levels in your urine.  Your baby is moving around less than usual.  You develop unusual discharge or bleeding from your vagina.  You start having contractions early (prematurely). Contractions may feel like a tightening in your lower abdomen. This information is not intended to replace advice given to you by your health care provider. Make sure you discuss any questions you have with your health care provider. Document Released: 07/05/2000 Document Revised: 09/04/2015 Document Reviewed: 05/02/2015 Elsevier Interactive Patient Education  2017 [employer].   Breastfeeding Deciding to breastfeed is one of the best choices you can make for you and your baby. A change in hormones during pregnancy causes your breast tissue to grow and increases the number and size of your milk ducts. These hormones also allow proteins, sugars, and fats from your blood supply to make breast milk in your milk-producing glands. Hormones prevent breast milk from being released before your baby is born as well as prompt milk flow after birth. Once breastfeeding has begun, thoughts of your baby, as well as his or  her sucking or crying, can stimulate the release of milk from your milk-producing glands. Benefits of breastfeeding For Your Baby  Your first milk (colostrum) helps your baby's digestive system function better.  There are antibodies in your milk that help your baby fight off infections.  Your baby has a lower incidence of asthma, allergies, and sudden infant death syndrome.  The nutrients in breast milk are better for your baby than infant formulas and are designed uniquely for your baby's needs.  Breast milk improves your baby's brain development.  Your baby is less likely to develop other conditions, such as childhood obesity, asthma, or type 2  diabetes mellitus.  For You  Breastfeeding helps to create a very special bond between you and your baby.  Breastfeeding is convenient. Breast milk is always available at the correct temperature and costs nothing.  Breastfeeding helps to burn calories and helps you lose the weight gained during pregnancy.  Breastfeeding makes your uterus contract to its prepregnancy size faster and slows bleeding (lochia) after you give birth.  Breastfeeding helps to lower your risk of developing type 2 diabetes mellitus, osteoporosis, and breast or ovarian cancer later in life.  Signs that your baby is hungry Early Signs of Hunger  Increased alertness or activity.  Stretching.  Movement of the head from side to side.  Movement of the head and opening of the mouth when the corner of the mouth or cheek is stroked (rooting).  Increased sucking sounds, smacking lips, cooing, sighing, or squeaking.  Hand-to-mouth movements.  Increased sucking of fingers or hands.  Late Signs of Hunger  Fussing.  Intermittent crying.  Extreme Signs of Hunger Signs of extreme hunger will require calming and consoling before your baby will be able to breastfeed successfully. Do not wait for the following signs of extreme hunger to occur before you initiate breastfeeding:  Restlessness.  A loud, strong cry.  Screaming.  Breastfeeding basics Breastfeeding Initiation  Find a comfortable place to sit or lie down, with your neck and back well supported.  Place a pillow or rolled up blanket under your baby to bring him or her to the level of your breast (if you are seated). Nursing pillows are specially designed to help support your arms and your baby while you breastfeed.  Make sure that your baby's abdomen is facing your abdomen.  Gently massage your breast. With your fingertips, massage from your chest wall toward your nipple in a circular motion. This encourages milk flow. You may need to continue this  action during the feeding if your milk flows slowly.  Support your breast with 4 fingers underneath and your thumb above your nipple. Make sure your fingers are well away from your nipple and your baby's mouth.  Stroke your baby's lips gently with your finger or nipple.  When your baby's mouth is open wide enough, quickly bring your baby to your breast, placing your entire nipple and as much of the colored area around your nipple (areola) as possible into your baby's mouth. ? More areola should be visible above your baby's upper lip than below the lower lip. ? Your baby's tongue should be between his or her lower gum and your breast.  Ensure that your baby's mouth is correctly positioned around your nipple (latched). Your baby's lips should create a seal on your breast and be turned out (everted).  It is common for your baby to suck about 2-3 minutes in order to start the flow of breast milk.  Latching  Teaching your baby how to latch on to your breast properly is very important. An improper latch can cause nipple pain and decreased milk supply for you and poor weight gain in your baby. Also, if your baby is not latched onto your nipple properly, he or she may swallow some air during feeding. This can make your baby fussy. Burping your baby when you switch breasts during the feeding can help to get rid of the air. However, teaching your baby to latch on properly is still the best way to prevent fussiness from swallowing air while breastfeeding. Signs that your baby has successfully latched on to your nipple:  Silent tugging or silent sucking, without causing you pain.  Swallowing heard between every 3-4 sucks.  Muscle movement above and in front of his or her ears while sucking.  Signs that your baby has not successfully latched on to nipple:  Sucking sounds or smacking sounds from your baby while breastfeeding.  Nipple pain.  If you think your baby has not latched on correctly, slip  your finger into the corner of your baby's mouth to break the suction and place it between your baby's gums. Attempt breastfeeding initiation again. Signs of Successful Breastfeeding Signs from your baby:  A gradual decrease in the number of sucks or complete cessation of sucking.  Falling asleep.  Relaxation of his or her body.  Retention of a small amount of milk in his or her mouth.  Letting go of your breast by himself or herself.  Signs from you:  Breasts that have increased in firmness, weight, and size 1-3 hours after feeding.  Breasts that are softer immediately after breastfeeding.  Increased milk volume, as well as a change in milk consistency and color by the fifth day of breastfeeding.  Nipples that are not sore, cracked, or bleeding.  Signs That Your Pecola Leisure is Getting Enough Milk  Wetting at least 1-2 diapers during the first 24 hours after birth.  Wetting at least 5-6 diapers every 24 hours for the first week after birth. The urine should be clear or pale yellow by 5 days after birth.  Wetting 6-8 diapers every 24 hours as your baby continues to grow and develop.  At least 3 stools in a 24-hour period by age 484 days. The stool should be soft and yellow.  At least 3 stools in a 24-hour period by age 233 days. The stool should be seedy and yellow.  No loss of weight greater than 10% of birth weight during the first 32 days of age.  Average weight gain of 4-7 ounces (113-198 g) per week after age 23 days.  Consistent daily weight gain by age 484 days, without weight loss after the age of 2 weeks.  After a feeding, your baby may spit up a small amount. This is common. Breastfeeding frequency and duration Frequent feeding will help you make more milk and can prevent sore nipples and breast engorgement. Breastfeed when you feel the need to reduce the fullness of your breasts or when your baby shows signs of hunger. This is called "breastfeeding on demand." Avoid introducing  a pacifier to your baby while you are working to establish breastfeeding (the first 4-6 weeks after your baby is born). After this time you may choose to use a pacifier. Research has shown that pacifier use during the first year of a baby's life decreases the risk of sudden infant death syndrome (SIDS). Allow your baby to feed on each breast as long as  he or she wants. Breastfeed until your baby is finished feeding. When your baby unlatches or falls asleep while feeding from the first breast, offer the second breast. Because newborns are often sleepy in the first few weeks of life, you may need to awaken your baby to get him or her to feed. Breastfeeding times will vary from baby to baby. However, the following rules can serve as a guide to help you ensure that your baby is properly fed:  Newborns (babies 81 weeks of age or younger) may breastfeed every 1-3 hours.  Newborns should not go longer than 3 hours during the day or 5 hours during the night without breastfeeding.  You should breastfeed your baby a minimum of 8 times in a 24-hour period until you begin to introduce solid foods to your baby at around 60 months of age.  Breast milk pumping Pumping and storing breast milk allows you to ensure that your baby is exclusively fed your breast milk, even at times when you are unable to breastfeed. This is especially important if you are going back to work while you are still breastfeeding or when you are not able to be present during feedings. Your lactation consultant can give you guidelines on how long it is safe to store breast milk. A breast pump is a machine that allows you to pump milk from your breast into a sterile bottle. The pumped breast milk can then be stored in a refrigerator or freezer. Some breast pumps are operated by hand, while others use electricity. Ask your lactation consultant which type will work best for you. Breast pumps can be purchased, but some hospitals and breastfeeding  support groups lease breast pumps on a monthly basis. A lactation consultant can teach you how to hand express breast milk, if you prefer not to use a pump. Caring for your breasts while you breastfeed Nipples can become dry, cracked, and sore while breastfeeding. The following recommendations can help keep your breasts moisturized and healthy:  Avoid using soap on your nipples.  Wear a supportive bra. Although not required, special nursing bras and tank tops are designed to allow access to your breasts for breastfeeding without taking off your entire bra or top. Avoid wearing underwire-style bras or extremely tight bras.  Air dry your nipples for 3-63minutes after each feeding.  Use only cotton bra pads to absorb leaked breast milk. Leaking of breast milk between feedings is normal.  Use lanolin on your nipples after breastfeeding. Lanolin helps to maintain your skin's normal moisture barrier. If you use pure lanolin, you do not need to wash it off before feeding your baby again. Pure lanolin is not toxic to your baby. You may also hand express a few drops of breast milk and gently massage that milk into your nipples and allow the milk to air dry.  In the first few weeks after giving birth, some women experience extremely full breasts (engorgement). Engorgement can make your breasts feel heavy, warm, and tender to the touch. Engorgement peaks within 3-5 days after you give birth. The following recommendations can help ease engorgement:  Completely empty your breasts while breastfeeding or pumping. You may want to start by applying warm, moist heat (in the shower or with warm water-soaked hand towels) just before feeding or pumping. This increases circulation and helps the milk flow. If your baby does not completely empty your breasts while breastfeeding, pump any extra milk after he or she is finished.  Wear a snug bra (nursing  or regular) or tank top for 1-2 days to signal your body to slightly  decrease milk production.  Apply ice packs to your breasts, unless this is too uncomfortable for you.  Make sure that your baby is latched on and positioned properly while breastfeeding.  If engorgement persists after 48 hours of following these recommendations, contact your health care provider or a Advertising copywriterlactation consultant. Overall health care recommendations while breastfeeding  Eat healthy foods. Alternate between meals and snacks, eating 3 of each per day. Because what you eat affects your breast milk, some of the foods may make your baby more irritable than usual. Avoid eating these foods if you are sure that they are negatively affecting your baby.  Drink milk, fruit juice, and water to satisfy your thirst (about 10 glasses a day).  Rest often, relax, and continue to take your prenatal vitamins to prevent fatigue, stress, and anemia.  Continue breast self-awareness checks.  Avoid chewing and smoking tobacco. Chemicals from cigarettes that pass into breast milk and exposure to secondhand smoke may harm your baby.  Avoid alcohol and drug use, including marijuana. Some medicines that may be harmful to your baby can pass through breast milk. It is important to ask your health care provider before taking any medicine, including all over-the-counter and prescription medicine as well as vitamin and herbal supplements. It is possible to become pregnant while breastfeeding. If birth control is desired, ask your health care provider about options that will be safe for your baby. Contact a health care provider if:  You feel like you want to stop breastfeeding or have become frustrated with breastfeeding.  You have painful breasts or nipples.  Your nipples are cracked or bleeding.  Your breasts are red, tender, or warm.  You have a swollen area on either breast.  You have a fever or chills.  You have nausea or vomiting.  You have drainage other than breast milk from your nipples.  Your  breasts do not become full before feedings by the fifth day after you give birth.  You feel sad and depressed.  Your baby is too sleepy to eat well.  Your baby is having trouble sleeping.  Your baby is wetting less than 3 diapers in a 24-hour period.  Your baby has less than 3 stools in a 24-hour period.  Your baby's skin or the white part of his or her eyes becomes yellow.  Your baby is not gaining weight by 705 days of age. Get help right away if:  Your baby is overly tired (lethargic) and does not want to wake up and feed.  Your baby develops an unexplained fever. This information is not intended to replace advice given to you by your health care provider. Make sure you discuss any questions you have with your health care provider. Document Released: 03/29/2005 Document Revised: 09/10/2015 Document Reviewed: 09/20/2012 Elsevier Interactive Patient Education  2017 [employer]

## 2016-10-01 ENCOUNTER — Encounter: Payer: Self-pay | Admitting: Obstetrics & Gynecology

## 2016-10-01 ENCOUNTER — Ambulatory Visit (HOSPITAL_COMMUNITY)
Admission: RE | Admit: 2016-10-01 | Discharge: 2016-10-01 | Disposition: A | Discharge status: Home / Self Care | Payer: Medicaid Other | Source: Ambulatory Visit | Attending: Obstetrics & Gynecology | Admitting: Obstetrics & Gynecology

## 2016-10-01 ENCOUNTER — Other Ambulatory Visit: Payer: Self-pay | Admitting: Obstetrics & Gynecology

## 2016-10-01 DIAGNOSIS — N83209 Unspecified ovarian cyst, unspecified side: Secondary | ICD-10-CM

## 2016-10-01 DIAGNOSIS — O3483 Maternal care for other abnormalities of pelvic organs, third trimester: Secondary | ICD-10-CM | POA: Diagnosis not present

## 2016-10-01 DIAGNOSIS — O24415 Gestational diabetes mellitus in pregnancy, controlled by oral hypoglycemic drugs: Secondary | ICD-10-CM

## 2016-10-01 DIAGNOSIS — Z3A37 37 weeks gestation of pregnancy: Secondary | ICD-10-CM

## 2016-10-01 DIAGNOSIS — O99213 Obesity complicating pregnancy, third trimester: Secondary | ICD-10-CM | POA: Insufficient documentation

## 2016-10-04 ENCOUNTER — Ambulatory Visit (INDEPENDENT_AMBULATORY_CARE_PROVIDER_SITE_OTHER): Payer: Medicaid Other

## 2016-10-04 VITALS — BP 118/80 | HR 97

## 2016-10-04 DIAGNOSIS — O24415 Gestational diabetes mellitus in pregnancy, controlled by oral hypoglycemic drugs: Secondary | ICD-10-CM

## 2016-10-04 NOTE — Progress Notes (Signed)
Patient presents for NST only visit. Armandina StammerJennifer Howard RNBSN

## 2016-10-05 ENCOUNTER — Other Ambulatory Visit: Payer: Self-pay

## 2016-10-07 ENCOUNTER — Ambulatory Visit (INDEPENDENT_AMBULATORY_CARE_PROVIDER_SITE_OTHER): Payer: Medicaid Other | Admitting: Family Medicine

## 2016-10-07 VITALS — BP 121/83 | HR 100 | Wt 271.0 lb

## 2016-10-07 DIAGNOSIS — Z6841 Body Mass Index (BMI) 40.0 and over, adult: Secondary | ICD-10-CM

## 2016-10-07 DIAGNOSIS — O0992 Supervision of high risk pregnancy, unspecified, second trimester: Secondary | ICD-10-CM | POA: Diagnosis not present

## 2016-10-07 DIAGNOSIS — O24415 Gestational diabetes mellitus in pregnancy, controlled by oral hypoglycemic drugs: Secondary | ICD-10-CM | POA: Diagnosis not present

## 2016-10-07 MED ORDER — GLYBURIDE 5 MG PO TABS: 7.5000 mg | ORAL_TABLET | Freq: Two times a day (BID) | ORAL | 1 refills | Status: DC

## 2016-10-07 NOTE — Progress Notes (Signed)
Subjective:  Christine BastosCheyenne Trujillo is a 21 y.o. G1P0 at 5057w3d being seen today for ongoing prenatal care.  She is currently monitored for the following issues for this high-risk pregnancy and has Supervision of high risk pregnancy in second trimester; Right ovarian cyst affecting pregnancy in second trimester, antepartum; BMI 45.0-49.9, adult (HCC); Obesity affecting pregnancy in second trimester; Depression; Low back pain; and Gestational diabetes mellitus (GDM) in third trimester controlled on oral hypoglycemic drug on her problem list.  GDM: Patient taking glyburide 5mg  BID.  Reports no hypoglycemic episodes.  Tolerating medication well Fasting: 92-110 2hr PP: Mostly elevated  Patient reports intermittent headache - but has hx of migraines. Feels similar. Improved after rest.  Contractions: Irregular. Vag. Bleeding: None.  Movement: Present. Denies leaking of fluid.   The following portions of the patient's history were reviewed and updated as appropriate: allergies, current medications, past family history, past medical history, past social history, past surgical history and problem list. Problem list updated.  Objective:   Vitals:   10/07/16 1052  BP: 121/83  Pulse: 100  Weight: 271 lb (122.9 kg)    Fetal Status: Fetal Heart Rate (bpm): NST   Movement: Present     General:  Alert, oriented and cooperative. Patient is in no acute distress.  Skin: Skin is warm and dry. No rash noted.   Cardiovascular: Normal heart rate noted  Respiratory: Normal respiratory effort, no problems with respiration noted  Abdomen: Soft, gravid, appropriate for gestational age. Pain/Pressure: Present     Pelvic: Vag. Bleeding: None Vag D/C Character: Thin   Cervical exam deferred        Extremities: Normal range of motion.  Edema: Trace  Mental Status: Normal mood and affect. Normal behavior. Normal judgment and thought content.   Urinalysis: Urine Protein: Negative Urine Glucose: Trace  Assessment and  Plan:  Pregnancy: G1P0 at 7857w3d  1. Supervision of high risk pregnancy in second trimester FHT normal. NST reactive. Preeclampsia precautions given. BP normal.  2. Gestational diabetes mellitus (GDM) in third trimester controlled on oral hypoglycemic drug Increase to 7.5mg  BID.  - glyBURIDE (DIABETA) 5 MG tablet; Take 1.5 tablets (7.5 mg total) by mouth 2 (two) times daily with a meal.  Dispense: 30 tablet; Refill: 1  3. BMI 45.0-49.9, adult [facility](HCC)  Term labor symptoms and general obstetric precautions including but not limited to vaginal bleeding, contractions, leaking of fluid and fetal movement were reviewed in detail with the patient. Please refer to After Visit Summary for other counseling recommendations.  No Follow-up on file.   Levie HeritageStinson, Jacob J, DO

## 2016-10-08 ENCOUNTER — Encounter: Payer: Self-pay | Admitting: Family Medicine

## 2016-10-11 ENCOUNTER — Inpatient Hospital Stay (HOSPITAL_COMMUNITY)
Admission: RE | Admit: 2016-10-11 | Discharge: 2016-10-13 | DRG: 775 | Disposition: A | Discharge status: Home / Self Care | Payer: Medicaid Other | Source: Ambulatory Visit | Attending: Obstetrics & Gynecology | Admitting: Obstetrics & Gynecology

## 2016-10-11 ENCOUNTER — Encounter (HOSPITAL_COMMUNITY): Payer: Self-pay | Admitting: Anesthesiology

## 2016-10-11 ENCOUNTER — Encounter (HOSPITAL_COMMUNITY): Payer: Self-pay

## 2016-10-11 DIAGNOSIS — Z6841 Body Mass Index (BMI) 40.0 and over, adult: Secondary | ICD-10-CM

## 2016-10-11 DIAGNOSIS — O24425 Gestational diabetes mellitus in childbirth, controlled by oral hypoglycemic drugs: Secondary | ICD-10-CM | POA: Diagnosis present

## 2016-10-11 DIAGNOSIS — O3483 Maternal care for other abnormalities of pelvic organs, third trimester: Secondary | ICD-10-CM | POA: Diagnosis present

## 2016-10-11 DIAGNOSIS — O24415 Gestational diabetes mellitus in pregnancy, controlled by oral hypoglycemic drugs: Secondary | ICD-10-CM | POA: Diagnosis present

## 2016-10-11 DIAGNOSIS — O99214 Obesity complicating childbirth: Secondary | ICD-10-CM | POA: Diagnosis present

## 2016-10-11 DIAGNOSIS — N83201 Unspecified ovarian cyst, right side: Secondary | ICD-10-CM | POA: Diagnosis present

## 2016-10-11 DIAGNOSIS — Z3A39 39 weeks gestation of pregnancy: Secondary | ICD-10-CM

## 2016-10-11 LAB — CBC
HCT: 32.5 % — ABNORMAL LOW (ref 36.0–46.0)
HEMOGLOBIN: 10.2 g/dL — AB (ref 12.0–15.0)
MCH: 23.5 pg — AB (ref 26.0–34.0)
MCHC: 31.4 g/dL (ref 30.0–36.0)
MCV: 74.9 fL — ABNORMAL LOW (ref 78.0–100.0)
PLATELETS: 272 10*3/uL (ref 150–400)
RBC: 4.34 MIL/uL (ref 3.87–5.11)
RDW: 17.5 % — ABNORMAL HIGH (ref 11.5–15.5)
WBC: 9.9 10*3/uL (ref 4.0–10.5)

## 2016-10-11 LAB — TYPE AND SCREEN
ABO/RH(D): B POS
Antibody Screen: NEGATIVE

## 2016-10-11 LAB — GLUCOSE, CAPILLARY
GLUCOSE-CAPILLARY: 114 mg/dL — AB (ref 65–99)
GLUCOSE-CAPILLARY: 103 mg/dL — AB (ref 65–99)
Glucose-Capillary: 86 mg/dL (ref 65–99)

## 2016-10-11 LAB — RPR: RPR: NONREACTIVE

## 2016-10-11 MED ORDER — SENNOSIDES-DOCUSATE SODIUM 8.6-50 MG PO TABS
2.0000 | ORAL_TABLET | ORAL | Status: DC
  Administered 2016-10-12: 2 via ORAL
  Filled 2016-10-11: qty 2

## 2016-10-11 MED ORDER — LIDOCAINE HCL (PF) 1 % IJ SOLN
30.0000 mL | INTRAMUSCULAR | Status: DC | PRN
  Administered 2016-10-11: 30 mL via SUBCUTANEOUS
  Filled 2016-10-11: qty 30

## 2016-10-11 MED ORDER — ONDANSETRON HCL 4 MG/2ML IJ SOLN
4.0000 mg | Freq: Four times a day (QID) | INTRAMUSCULAR | Status: DC | PRN
  Administered 2016-10-11: 4 mg via INTRAVENOUS
  Filled 2016-10-11: qty 2

## 2016-10-11 MED ORDER — OXYCODONE-ACETAMINOPHEN 5-325 MG PO TABS: 2.0000 | ORAL_TABLET | ORAL | Status: DC | PRN

## 2016-10-11 MED ORDER — DIBUCAINE 1 % RE OINT: 1.0000 "application " | TOPICAL_OINTMENT | RECTAL | Status: DC | PRN

## 2016-10-11 MED ORDER — LACTATED RINGERS IV SOLN: 500.0000 mL | INTRAVENOUS | Status: DC | PRN

## 2016-10-11 MED ORDER — WITCH HAZEL-GLYCERIN EX PADS: 1.0000 "application " | MEDICATED_PAD | CUTANEOUS | Status: DC | PRN

## 2016-10-11 MED ORDER — TERBUTALINE SULFATE 1 MG/ML IJ SOLN
0.2500 mg | Freq: Once | INTRAMUSCULAR | Status: DC | PRN
  Filled 2016-10-11: qty 1

## 2016-10-11 MED ORDER — OXYCODONE HCL 5 MG PO TABS
5.0000 mg | ORAL_TABLET | ORAL | Status: DC | PRN
  Administered 2016-10-12 (×2): 5 mg via ORAL
  Filled 2016-10-11 (×3): qty 1

## 2016-10-11 MED ORDER — EPHEDRINE 5 MG/ML INJ
10.0000 mg | INTRAVENOUS | Status: DC | PRN
  Filled 2016-10-11: qty 2

## 2016-10-11 MED ORDER — COCONUT OIL OIL
1.0000 | TOPICAL_OIL | Status: DC | PRN
Start: 2016-10-11 — End: 2016-10-13

## 2016-10-11 MED ORDER — FENTANYL CITRATE (PF) 100 MCG/2ML IJ SOLN
100.0000 ug | INTRAMUSCULAR | Status: DC | PRN
  Administered 2016-10-11: 100 ug via INTRAVENOUS
  Filled 2016-10-11: qty 2

## 2016-10-11 MED ORDER — ACETAMINOPHEN 325 MG PO TABS: 650.0000 mg | ORAL_TABLET | ORAL | Status: DC | PRN

## 2016-10-11 MED ORDER — PHENYLEPHRINE 40 MCG/ML (10ML) SYRINGE FOR IV PUSH (FOR BLOOD PRESSURE SUPPORT)
80.0000 ug | PREFILLED_SYRINGE | INTRAVENOUS | Status: DC | PRN
  Filled 2016-10-11: qty 5

## 2016-10-11 MED ORDER — TETANUS-DIPHTH-ACELL PERTUSSIS 5-2.5-18.5 LF-MCG/0.5 IM SUSP
0.5000 mL | Freq: Once | INTRAMUSCULAR | Status: AC
  Administered 2016-10-13: 0.5 mL via INTRAMUSCULAR

## 2016-10-11 MED ORDER — ACETAMINOPHEN 325 MG PO TABS
650.0000 mg | ORAL_TABLET | ORAL | Status: DC | PRN
  Administered 2016-10-12: 650 mg via ORAL
  Filled 2016-10-11 (×2): qty 2

## 2016-10-11 MED ORDER — ONDANSETRON HCL 4 MG PO TABS: 4.0000 mg | ORAL_TABLET | ORAL | Status: DC | PRN

## 2016-10-11 MED ORDER — DIPHENHYDRAMINE HCL 50 MG/ML IJ SOLN: 12.5000 mg | INTRAMUSCULAR | Status: DC | PRN

## 2016-10-11 MED ORDER — OXYCODONE-ACETAMINOPHEN 5-325 MG PO TABS
1.0000 | ORAL_TABLET | ORAL | Status: DC | PRN
Start: 2016-10-11 — End: 2016-10-11

## 2016-10-11 MED ORDER — PHENYLEPHRINE 40 MCG/ML (10ML) SYRINGE FOR IV PUSH (FOR BLOOD PRESSURE SUPPORT)
80.0000 ug | PREFILLED_SYRINGE | INTRAVENOUS | Status: DC | PRN
  Filled 2016-10-11: qty 10
  Filled 2016-10-11: qty 5

## 2016-10-11 MED ORDER — FENTANYL 2.5 MCG/ML BUPIVACAINE 1/10 % EPIDURAL INFUSION (WH - ANES): 14.0000 mL/h | INTRAMUSCULAR | Status: DC | PRN

## 2016-10-11 MED ORDER — PRENATAL MULTIVITAMIN CH
1.0000 | ORAL_TABLET | Freq: Every day | ORAL | Status: DC
  Administered 2016-10-12: 1 via ORAL
  Filled 2016-10-11: qty 1

## 2016-10-11 MED ORDER — BUTORPHANOL TARTRATE 1 MG/ML IJ SOLN: 2.0000 mg | INTRAMUSCULAR | Status: DC | PRN

## 2016-10-11 MED ORDER — BENZOCAINE-MENTHOL 20-0.5 % EX AERO: 1.0000 "application " | INHALATION_SPRAY | CUTANEOUS | Status: DC | PRN

## 2016-10-11 MED ORDER — ONDANSETRON HCL 4 MG/2ML IJ SOLN: 4.0000 mg | INTRAMUSCULAR | Status: DC | PRN

## 2016-10-11 MED ORDER — LACTATED RINGERS IV SOLN
INTRAVENOUS | Status: DC
  Administered 2016-10-11: 08:00:00 via INTRAVENOUS

## 2016-10-11 MED ORDER — FENTANYL 2.5 MCG/ML BUPIVACAINE 1/10 % EPIDURAL INFUSION (WH - ANES)
14.0000 mL/h | INTRAMUSCULAR | Status: DC | PRN
  Filled 2016-10-11: qty 100

## 2016-10-11 MED ORDER — OXYTOCIN BOLUS FROM INFUSION
500.0000 mL | Freq: Once | INTRAVENOUS | Status: AC
  Administered 2016-10-11: 500 mL via INTRAVENOUS

## 2016-10-11 MED ORDER — SOD CITRATE-CITRIC ACID 500-334 MG/5ML PO SOLN: 30.0000 mL | ORAL | Status: DC | PRN

## 2016-10-11 MED ORDER — FLEET ENEMA 7-19 GM/118ML RE ENEM: 1.0000 | ENEMA | RECTAL | Status: DC | PRN

## 2016-10-11 MED ORDER — OXYTOCIN 40 UNITS IN LACTATED RINGERS INFUSION - SIMPLE MED
2.5000 [IU]/h | INTRAVENOUS | Status: DC
  Filled 2016-10-11: qty 1000

## 2016-10-11 MED ORDER — IBUPROFEN 600 MG PO TABS
600.0000 mg | ORAL_TABLET | Freq: Four times a day (QID) | ORAL | Status: DC
  Administered 2016-10-11 – 2016-10-13 (×6): 600 mg via ORAL
  Filled 2016-10-11 (×6): qty 1

## 2016-10-11 MED ORDER — ZOLPIDEM TARTRATE 5 MG PO TABS: 5.0000 mg | ORAL_TABLET | Freq: Every evening | ORAL | Status: DC | PRN

## 2016-10-11 MED ORDER — MISOPROSTOL 25 MCG QUARTER TABLET
25.0000 ug | ORAL_TABLET | ORAL | Status: DC | PRN
  Administered 2016-10-11 (×3): 25 ug via VAGINAL
  Filled 2016-10-11 (×5): qty 1

## 2016-10-11 MED ORDER — DIPHENHYDRAMINE HCL 25 MG PO CAPS: 25.0000 mg | ORAL_CAPSULE | Freq: Four times a day (QID) | ORAL | Status: DC | PRN

## 2016-10-11 MED ORDER — SIMETHICONE 80 MG PO CHEW: 80.0000 mg | CHEWABLE_TABLET | ORAL | Status: DC | PRN

## 2016-10-11 MED ORDER — OXYTOCIN 40 UNITS IN LACTATED RINGERS INFUSION - SIMPLE MED: 1.0000 m[IU]/min | INTRAVENOUS | Status: DC

## 2016-10-11 MED ORDER — LACTATED RINGERS IV SOLN: 500.0000 mL | Freq: Once | INTRAVENOUS | Status: DC

## 2016-10-11 MED ORDER — SERTRALINE HCL 50 MG PO TABS
50.0000 mg | ORAL_TABLET | Freq: Every day | ORAL | Status: DC
  Administered 2016-10-11 – 2016-10-12 (×2): 50 mg via ORAL
  Filled 2016-10-11 (×3): qty 1

## 2016-10-11 NOTE — H&P (Signed)
Christine Trujillo is a 21 y.o. female G1P0 with IUP at 43w0dpresenting for IOL for GDAM2 on glyburide with sugars not well controlled. . Pt states she has been having none contractions, associated with none vaginal bleeding for no hours..  Membranes are intact, with active fetal movement.   [facility]  Prenatal History/Complications:  Past Medical History: Past Medical History:  Diagnosis Date  . Diabetes mellitus without complication ([place]   . Gestational diabetes    glyburide  . Headache   . Migraine   . Obesity   . Paratubal cyst 02/19/2016    Past Surgical History: Past Surgical History:  Procedure Laterality Date  . WISDOM TOOTH EXTRACTION      Obstetrical History: OB History    Gravida Para Term Preterm AB Living   1         0   SAB TAB Ectopic Multiple Live Births                   Social History: Social History   Social History  . Marital status: Single    Spouse name: N/A  . Number of children: N/A  . Years of education: N/A   Social History Main Topics  . Smoking status: Never Smoker  . Smokeless tobacco: Never Used  . Alcohol use No  . Drug use: Yes    Types: Marijuana     Comment: last use Nov 2017  . Sexual activity: Yes    Birth control/ protection: None   Other Topics Concern  . Not on file   Social History Narrative  . No narrative on file    Family History: Family History  Problem Relation Age of Onset  . Heart disease Mother     Allergies: No Known Allergies  Facility-Administered Medications Prior to Admission  Medication Dose Route Frequency Provider Last Rate Last Dose  . Tdap (BOOSTRIX) injection 0.5 mL  0.5 mL Intramuscular Once STruett Mainland DO       Prescriptions Prior to Admission  Medication Sig Dispense Refill Last Dose  . ACCU-CHEK FASTCLIX LANCETS MISC 1 Units by Percutaneous route 4 (four) times daily. 100 each 12 Taking  . Blood Glucose Monitoring Suppl (ACCU-CHEK NANO  SMARTVIEW) w/Device KIT 1 kit by Subdermal route as directed. Check blood sugars for fasting, and two hours after breakfast, lunch and dinner (4 checks daily) 1 kit 0 Taking  . cetirizine (ZYRTEC) 10 MG tablet Take 1 tablet (10 mg total) by mouth daily. 30 tablet 2 Taking  . cyclobenzaprine (FLEXERIL) 10 MG tablet Take 1 tablet (10 mg total) by mouth every 8 (eight) hours as needed for muscle spasms. 90 tablet 3 Taking  . Doxylamine-Pyridoxine (DICLEGIS) 10-10 MG TBEC Take 1 tablet by mouth 4 (four) times daily as needed. 100 tablet 3 Taking  . glucose blood (ACCU-CHEK SMARTVIEW) test strip Use as instructed to check blood sugars 100 each 12 Taking  . glyBURIDE (DIABETA) 5 MG tablet Take 1.5 tablets (7.5 mg total) by mouth 2 (two) times daily with a meal. 30 tablet 1   . ondansetron (ZOFRAN ODT) 4 MG disintegrating tablet Take 1 tablet (4 mg total) by mouth every 6 (six) hours as needed for nausea. 20 tablet 0 Taking  . ondansetron (ZOFRAN ODT) 4 MG disintegrating tablet Take 1 tablet (4 mg total) by mouth every 8 (eight) hours as needed for nausea or vomiting. 10 tablet 1 Taking  . polyethylene glycol (MIRALAX) packet Take 17  g by mouth daily. 14 each 0   . Prenat w/o A Vit-FeFum-FePo-FA (CONCEPT OB) 130-92.4-1 MG CAPS Take 1 tablet by mouth daily. 30 capsule 12 Taking  . promethazine (PHENERGAN) 25 MG tablet Take 25 mg by mouth every 6 (six) hours as needed for nausea or vomiting.   Taking  . promethazine (PHENERGAN) 25 MG tablet Take 1 tablet (25 mg total) by mouth every 6 (six) hours as needed for nausea or vomiting. 20 tablet 1 Taking  . sertraline (ZOLOFT) 50 MG tablet Take 1 tablet (50 mg total) by mouth daily. 30 tablet 4 Taking        Review of Systems   Constitutional: Negative for fever and chills Eyes: Negative for visual disturbances Respiratory: Negative for shortness of breath, dyspnea Cardiovascular: Negative for chest pain or palpitations  Gastrointestinal: Negative for  vomiting, diarrhea and constipation.  POSITIVE for abdominal pain (contractions) Genitourinary: Negative for dysuria and urgency Musculoskeletal: Negative for back pain, joint pain, myalgias  Neurological: Negative for dizziness and headaches      Last menstrual period 01/12/2016. General appearance: alert, cooperative and no distress Lungs: clear to auscultation bilaterally Heart: regular rate and rhythm Abdomen: soft, non-tender; bowel sounds normal Extremities: Homans sign is negative, no sign of DVT DTR's 2+ Presentation: cephalic Fetal monitoring  Baseline: 150 bpm and Variability: Good {> 6 bpm) Uterine activity  Intensity: irregular     Prenatal labs: ABO, Rh: B/POS/-- (01/11 1352) Antibody: NEG (01/11 1352) Rubella: !Error! RPR: Non Reactive (05/18 1121)  HBsAg: NEGATIVE (01/11 1352)  HIV: Non Reactive (05/18 1121)  GBS:   negative 1 hr Glucola 102 Genetic screening  normal Anatomy US normal  Prenatal Transfer Tool  Maternal Diabetes: Yes:  Diabetes Type:  Insulin/Medication controlled Genetic Screening: Normal Maternal Ultrasounds/Referrals: Normal [place] for cyst on right ovary Fetal Ultrasounds or other Referrals:  Other:  Maternal Substance Abuse:  No Significant Maternal Medications:  Meds include: Other: zoloft and glyburide 7.5 Significant Maternal Lab Results: None     No results found for this or any previous visit (from the past 24 hour(s)).  Assessment: Christine Trujillo is a 21 y.o. G1P0 with an IUP at 64w0dpresenting for IOL for gestational diabetes. Last EFW at 10/01/2016 was 7 lbs 7 oz.   Plan: #Labor: expectant management #Pain:  Per request #FWB Cat 1 #ID: GBS: negative  #MOF: bottle #MOC: Depo #Circ: girl    KStarr LakeCNM 10/11/2016, 7:05 AM

## 2016-10-11 NOTE — Anesthesia Pain Management Evaluation Note (Signed)
  CRNA Pain Management Visit Note  Patient: Christine Bastosheyenne Trujillo, 21 y.o., female  "Hello I am a [facility] the [facility]. We have an anesthesia team available at all times to provide care throughout the hospital, including epidural management and anesthesia for C-section. I don't know your plan for the delivery whether it a natural birth, water birth, IV sedation, nitrous supplementation, doula or epidural, but we want to meet your pain goals."   1.Was your pain managed to your expectations on prior hospitalizations?   No prior hospitalizations  2.What is your expectation for pain management during this hospitalization?     Epidural  3.How can we help you reach that goal?   Record the patient's initial score and the patient's pain goal.   Pain: 0  Pain Goal: 5 [facility] wants you to be able to say your pain was always managed very well.  Christine Trujillo,Christine Trujillo 10/11/2016

## 2016-10-11 NOTE — Progress Notes (Signed)
Patient ID: Christine Trujillo, female   DOB: 01/18/1996, 21 y.o.   MRN: [id number]  Feeling sl crampy  VSS, afeb FHR 135-140s, +accels, no decels Ctx q 1-5 mins Cx 1/50  CBG: 86  IUP@term  GDM A2 Cx unfavorable  Cervical foley inserted without difficulty and a third vag cytotec placed; will begin Pit when it comes out  [name], KIMBERLY [facility] CenterCNM 10/11/2016 5:46 PM

## 2016-10-11 NOTE — Anesthesia Preprocedure Evaluation (Deleted)
Anesthesia Evaluation  Patient identified by MRN, date of birth, ID band Patient awake    Reviewed: Allergy & Precautions, NPO status , Patient's Chart, lab work & pertinent test results  Airway Mallampati: II  TM Distance: >3 FB Neck ROM: Full    Dental no notable dental hx.    Pulmonary neg pulmonary ROS,    Pulmonary exam normal breath sounds clear to auscultation       Cardiovascular negative cardio ROS Normal cardiovascular exam Rhythm:Regular Rate:Normal     Neuro/Psych  Headaches, PSYCHIATRIC DISORDERS Depression    GI/Hepatic negative GI ROS, Neg liver ROS,   Endo/Other  diabetes, GestationalMorbid obesity  Renal/GU negative Renal ROS     Musculoskeletal negative musculoskeletal ROS (+)   Abdominal   Peds  Hematology negative hematology ROS (+)   Anesthesia Other Findings   Reproductive/Obstetrics (+) Pregnancy                             Anesthesia Physical Anesthesia Plan  ASA: III  Anesthesia Plan: Epidural   Post-op Pain Management:    Induction:   PONV Risk Score and Plan:   Airway Management Planned:   Additional Equipment:   Intra-op Plan:   Post-operative Plan:   Informed Consent: I have reviewed the patients History and Physical, chart, labs and discussed the procedure including the risks, benefits and alternatives for the proposed anesthesia with the patient or authorized representative who has indicated his/her understanding and acceptance.     Plan Discussed with:   Anesthesia Plan Comments:         Anesthesia Quick Evaluation

## 2016-10-11 NOTE — Progress Notes (Signed)
Patient ID: Christine Trujillo, female   DOB: 09/04/1995, 21 y.o.   MRN: [id number]  Feels well, no pain.  VSS, afeb FHR 130s, +accels, no decels Ctx q 3-6 mins Cx 1/50/-3, med consistency  CBG 103  IUP@term  GDM A2 Unfavorable cx  Placed a second cytotec; may try for cervical foley in 4 hrs  [name], KIMBERLY CNM 10/11/2016 12:36 PM

## 2016-10-12 NOTE — Progress Notes (Signed)
CSW received consult for hx of marijuana use.  Referral was screened out due to the following: ~MOB had no documented substance use after initial prenatal visit/+UPT. ~MOB had no positive drug screens after initial prenatal visit/+UPT. ~Baby's UDS is negative.  CSW will monitor CDS results and make report to Child Protective Services if warranted.  MOB was referred for history of depression/anxiety. * Referral screened out by Clinical Social Worker because none of the following criteria appear to apply: ~ History of anxiety/depression during this pregnancy, or of post-partum depression. ~ Diagnosis of anxiety and/or depression within last 3 years OR * MOB's symptoms currently being treated with medication and/or therapy. Please contact the Clinical Social Worker if needs arise, or if MOB requests.   Please consult CSW if current concerns arise or by MOB's request.  Blaine HamperAngel Boyd-Gilyard, MSW, LCSW Clinical Social Work [phone]

## 2016-10-12 NOTE — Progress Notes (Signed)
Post Partum Day #1 Subjective: no complaints, up ad lib and tolerating PO; bottlefeeding; plans on Depo for contraception  Objective: Blood pressure 109/65, pulse 88, temperature 98.4 F (36.9 C), temperature source Oral, resp. rate 18, height 5\' 3"  (1.6 m), weight 122.9 kg (271 lb), last menstrual period 01/12/2016, SpO2 100 %, unknown if currently breastfeeding.  Physical Exam:  General: alert, cooperative and no distress Lochia: appropriate Uterine Fundus: firm DVT Evaluation: No evidence of DVT seen on physical exam.   Recent Labs  10/11/16 0730  HGB 10.2*  HCT 32.5*    Assessment/Plan: Plan for discharge tomorrow   LOS: 1 day   [name], KIMBERLY CNM 10/12/2016, 9:34 AM

## 2016-10-12 NOTE — Plan of Care (Signed)
Problem: Pain Managment: Goal: General experience of comfort will improve Outcome: Progressing Patient having hip pain, especially with ambulation,  and taking Oxycodone IR to manage discomfort.

## 2016-10-13 MED ORDER — IBUPROFEN 600 MG PO TABS: 600.0000 mg | ORAL_TABLET | Freq: Four times a day (QID) | ORAL | 0 refills | Status: DC

## 2016-10-13 MED ORDER — MEDROXYPROGESTERONE ACETATE 150 MG/ML IM SUSP
150.0000 mg | Freq: Once | INTRAMUSCULAR | Status: AC
  Administered 2016-10-13: 150 mg via INTRAMUSCULAR
  Filled 2016-10-13: qty 1

## 2016-10-13 NOTE — Discharge Instructions (Signed)

## 2016-10-13 NOTE — Discharge Summary (Signed)
OB Discharge Summary  Patient Name: Christine Trujillo DOB: 04/07/1996 MRN: [id number]  Date of admission: 10/11/2016 Delivering MD: [name], KIMBERLY D   Date of discharge: 10/13/2016  Admitting diagnosis: INDUCTION Intrauterine pregnancy: 123w0d     Secondary diagnosis:Active Problems:   Gestational diabetes mellitus (GDM) controlled on oral hypoglycemic drug  Additional problems: morbid obesity     Discharge diagnosis: Term Pregnancy Delivered                                                                      Augmentation: Cytotec and Foley Balloon  Complications: None  Hospital course:  Induction of Labor With Vaginal Delivery   21 y.o. yo G1P1001 at 173w0d was admitted to the hospital 10/11/2016 for induction of labor.  Indication for induction: A2 DM.  Patient had an uncomplicated labor course as follows: Membrane Rupture Time/Date: 8:46 PM ,10/11/2016   Intrapartum Procedures: Episiotomy: None [1]                                         Lacerations:  2nd degree [3];Perineal [11]  Patient had delivery of a Viable infant.  Information for the patient's newborn:  Audria NineMcPherson, Girl [place] [[id number]][[id number]]  Delivery Method: Vaginal, Spontaneous Delivery (Filed from Delivery Summary)   10/11/2016  Details of delivery can be found in separate delivery note.  Patient had a routine postpartum course. Patient is discharged home 10/13/16.  Physical exam  Vitals:   10/11/16 2355 10/12/16 0400 10/12/16 1820 10/13/16 0610  BP: 139/71 109/65 (!) 143/85 107/67  Pulse: 92 88 97 83  Resp: 18 18 18 18   Temp: 99 F (37.2 C) 98.4 F (36.9 C) 98 F (36.7 C) 98.1 F (36.7 C)  TempSrc: Oral Oral Oral Oral  SpO2: 100%  100%   Weight:      Height:       General: alert Lochia: appropriate Uterine Fundus: firm Incision: N/A DVT Evaluation: No evidence of DVT seen on physical exam. Labs: Lab Results  Component Value Date   WBC 9.9 10/11/2016   HGB 10.2 (L) 10/11/2016   HCT 32.5 (L)  10/11/2016   MCV 74.9 (L) 10/11/2016   PLT 272 10/11/2016   CMP Latest Ref Rng & Units 09/16/2016  Glucose 65 - 99 mg/dL 79  BUN 6 - 20 mg/dL 6  Creatinine 7.820.44 - 9.561.00 mg/dL 2.130.52  Sodium 086135 - 578145 mmol/L 134(L)  Potassium 3.5 - 5.1 mmol/L 3.2(L)  Chloride 101 - 111 mmol/L 105  CO2 22 - 32 mmol/L 21(L)  Calcium 8.9 - 10.3 mg/dL 8.9  Total Protein 6.1 - 8.1 g/dL -  Total Bilirubin 0.2 - 1.2 mg/dL -  Alkaline Phos 33 - 469115 U/L -  AST 10 - 30 U/L -  ALT 6 - 29 U/L -    Discharge instruction: per After Visit Summary and "Baby and Me Booklet".  After Visit Meds:    Diet: carb modified diet  Activity: Advance as tolerated. Pelvic rest for 6 weeks.   Outpatient follow up:She has an appt 11-12-16 Follow up Appt:Future Appointments Date Time Provider Department Center  11/12/2016 10:30 AM [place],  Rhona Raider, DO [facility] None   Follow up visit: No Follow-up on file.  Postpartum contraception: Depo Provera  Newborn Data: Live born female  Birth Weight: 7 lb 14.6 oz (3589 g) APGAR: 8, 9  Baby Feeding: Bottle Disposition:home with mother   10/13/2016 Allie Bossier, MD

## 2016-10-18 ENCOUNTER — Telehealth: Payer: Self-pay

## 2016-10-20 ENCOUNTER — Ambulatory Visit: Payer: Self-pay | Admitting: Family Medicine

## 2016-10-20 ENCOUNTER — Telehealth: Payer: Self-pay | Admitting: General Practice

## 2016-10-20 DIAGNOSIS — Z0289 Encounter for other administrative examinations: Secondary | ICD-10-CM

## 2016-10-20 NOTE — Telephone Encounter (Signed)
Pt lvm at 800 to cancel appt.

## 2016-11-10 ENCOUNTER — Encounter: Payer: Self-pay | Admitting: Behavioral Health

## 2016-11-10 ENCOUNTER — Telehealth: Payer: Self-pay | Admitting: Behavioral Health

## 2016-11-10 NOTE — Telephone Encounter (Signed)
Pre-Visit Call completed with patient and chart updated.   Pre-Visit Info documented in Specialty Comments under SnapShot.    

## 2016-11-11 ENCOUNTER — Ambulatory Visit: Payer: Self-pay | Admitting: Family Medicine

## 2016-11-11 DIAGNOSIS — Z0289 Encounter for other administrative examinations: Secondary | ICD-10-CM

## 2016-11-12 ENCOUNTER — Encounter: Payer: Self-pay | Admitting: Family Medicine

## 2016-11-12 ENCOUNTER — Ambulatory Visit (INDEPENDENT_AMBULATORY_CARE_PROVIDER_SITE_OTHER): Payer: Medicaid Other | Admitting: Family Medicine

## 2016-11-12 VITALS — BP 130/67 | HR 86 | Ht 64.0 in | Wt 254.0 lb

## 2016-11-12 DIAGNOSIS — Z1389 Encounter for screening for other disorder: Secondary | ICD-10-CM | POA: Diagnosis not present

## 2016-11-12 DIAGNOSIS — O24415 Gestational diabetes mellitus in pregnancy, controlled by oral hypoglycemic drugs: Secondary | ICD-10-CM

## 2016-11-12 MED ORDER — MEDROXYPROGESTERONE ACETATE 150 MG/ML IM SUSP: 150.0000 mg | INTRAMUSCULAR | 3 refills | Status: DC

## 2016-11-12 NOTE — Progress Notes (Signed)
Post Partum Exam  Christine Trujillo is a 21 y.o. 521P1001 female who presents for a postpartum visit. She is 4 weeks postpartum following a spontaneous vaginal delivery. I have fully reviewed the prenatal and intrapartum course. The delivery was at 39 gestational weeks.  Anesthesia: none. Postpartum course has been uneventful. Baby's course has been uneventful. Baby is feeding by bottle- similac Soy. Bleeding occasional clots. Bowel function is abnormal: constipation. Bladder function is normal. Patient is sexually active. Contraception method is Depo-Provera injections (recieve first injection in hospital. Postpartum depression screening:neg- score 1  The following portions of the patient's history were reviewed and updated as appropriate: allergies, current medications, past family history, past medical history, past social history, past surgical history and problem list.  Review of Systems Pertinent items are noted in HPI.    Objective:  unknown if currently breastfeeding.  General:  alert, cooperative and no distress  Lungs: clear to auscultation bilaterally  Heart:  regular rate and rhythm, S1, S2 normal, no murmur, click, rub or gallop  Abdomen: soft, non-tender; bowel sounds normal; no masses,  no organomegaly        Assessment:    normal postpartum exam. GDM. Pap smear not done at today's visit.   Plan:   1. Contraception: Depo-Provera injections 2. Check 2hr GTT in 2 weeks 3. Follow up in: 1 year or as needed.

## 2016-11-13 ENCOUNTER — Ambulatory Visit (HOSPITAL_BASED_OUTPATIENT_CLINIC_OR_DEPARTMENT_OTHER)
Admit: 2016-11-13 | Discharge: 2016-11-13 | Disposition: A | Discharge status: Home / Self Care | Payer: Medicaid Other | Source: Ambulatory Visit | Attending: Emergency Medicine | Admitting: Emergency Medicine

## 2016-11-13 ENCOUNTER — Emergency Department (HOSPITAL_BASED_OUTPATIENT_CLINIC_OR_DEPARTMENT_OTHER)
Admission: EM | Admit: 2016-11-13 | Discharge: 2016-11-13 | Disposition: A | Discharge status: Home / Self Care | Payer: Medicaid Other | Attending: Emergency Medicine | Admitting: Emergency Medicine

## 2016-11-13 ENCOUNTER — Encounter (HOSPITAL_BASED_OUTPATIENT_CLINIC_OR_DEPARTMENT_OTHER): Payer: Self-pay | Admitting: Emergency Medicine

## 2016-11-13 DIAGNOSIS — R1010 Upper abdominal pain, unspecified: Secondary | ICD-10-CM | POA: Diagnosis present

## 2016-11-13 DIAGNOSIS — Z79899 Other long term (current) drug therapy: Secondary | ICD-10-CM | POA: Diagnosis not present

## 2016-11-13 DIAGNOSIS — R1011 Right upper quadrant pain: Secondary | ICD-10-CM | POA: Insufficient documentation

## 2016-11-13 DIAGNOSIS — R110 Nausea: Secondary | ICD-10-CM | POA: Diagnosis not present

## 2016-11-13 DIAGNOSIS — K8020 Calculus of gallbladder without cholecystitis without obstruction: Secondary | ICD-10-CM | POA: Insufficient documentation

## 2016-11-13 DIAGNOSIS — E119 Type 2 diabetes mellitus without complications: Secondary | ICD-10-CM | POA: Insufficient documentation

## 2016-11-13 DIAGNOSIS — K8050 Calculus of bile duct without cholangitis or cholecystitis without obstruction: Secondary | ICD-10-CM | POA: Diagnosis not present

## 2016-11-13 HISTORY — DX: Calculus of gallbladder without cholecystitis without obstruction: K80.20

## 2016-11-13 LAB — COMPREHENSIVE METABOLIC PANEL
ALK PHOS: 74 U/L (ref 38–126)
ALT: 14 U/L (ref 14–54)
ANION GAP: 9 (ref 5–15)
AST: 21 U/L (ref 15–41)
Albumin: 3.8 g/dL (ref 3.5–5.0)
BILIRUBIN TOTAL: 0.5 mg/dL (ref 0.3–1.2)
BUN: 11 mg/dL (ref 6–20)
CALCIUM: 8.8 mg/dL — AB (ref 8.9–10.3)
CO2: 22 mmol/L (ref 22–32)
Chloride: 106 mmol/L (ref 101–111)
Creatinine, Ser: 0.92 mg/dL (ref 0.44–1.00)
GFR calc non Af Amer: >60 mL/min (ref >60)
Glucose, Bld: 93 mg/dL (ref 65–99)
Potassium: 3.4 mmol/L — ABNORMAL LOW (ref 3.5–5.1)
SODIUM: 137 mmol/L (ref 135–145)
TOTAL PROTEIN: 6.9 g/dL (ref 6.5–8.1)

## 2016-11-13 LAB — CBC WITH DIFFERENTIAL/PLATELET
BASOS ABS: 0 10*3/uL (ref 0.0–0.1)
Basophils Relative: 0 %
Eosinophils Absolute: 0.2 10*3/uL (ref 0.0–0.7)
Eosinophils Relative: 2 %
HEMATOCRIT: 33.7 % — AB (ref 36.0–46.0)
Hemoglobin: 10.8 g/dL — ABNORMAL LOW (ref 12.0–15.0)
LYMPHS ABS: 2.9 10*3/uL (ref 0.7–4.0)
LYMPHS PCT: 34 %
MCH: 23.7 pg — AB (ref 26.0–34.0)
MCHC: 32 g/dL (ref 30.0–36.0)
MCV: 74.1 fL — ABNORMAL LOW (ref 78.0–100.0)
MONO ABS: 0.7 10*3/uL (ref 0.1–1.0)
Monocytes Relative: 8 %
NEUTROS ABS: 4.7 10*3/uL (ref 1.7–7.7)
Neutrophils Relative %: 56 %
PLATELETS: 277 10*3/uL (ref 150–400)
RBC: 4.55 MIL/uL (ref 3.87–5.11)
RDW: 17.1 % — ABNORMAL HIGH (ref 11.5–15.5)
WBC: 8.5 10*3/uL (ref 4.0–10.5)

## 2016-11-13 LAB — LIPASE, BLOOD: LIPASE: 31 U/L (ref 11–51)

## 2016-11-13 MED ORDER — FENTANYL CITRATE (PF) 100 MCG/2ML IJ SOLN
100.0000 ug | Freq: Once | INTRAMUSCULAR | Status: AC
  Administered 2016-11-13: 100 ug via INTRAVENOUS

## 2016-11-13 MED ORDER — ONDANSETRON HCL 4 MG/2ML IJ SOLN
INTRAMUSCULAR | Status: AC
  Administered 2016-11-13: 4 mg via INTRAVENOUS
  Filled 2016-11-13: qty 2

## 2016-11-13 MED ORDER — SODIUM CHLORIDE 0.9 % IV SOLN
Freq: Once | INTRAVENOUS | Status: AC
  Administered 2016-11-13: 05:00:00 via INTRAVENOUS

## 2016-11-13 MED ORDER — ONDANSETRON HCL 4 MG/2ML IJ SOLN
4.0000 mg | Freq: Once | INTRAMUSCULAR | Status: AC
  Administered 2016-11-13: 4 mg via INTRAVENOUS

## 2016-11-13 MED ORDER — FENTANYL CITRATE (PF) 100 MCG/2ML IJ SOLN
INTRAMUSCULAR | Status: AC
  Administered 2016-11-13: 100 ug via INTRAVENOUS
  Filled 2016-11-13: qty 2

## 2016-11-13 NOTE — ED Provider Notes (Signed)
[facility] Provider Note: Lowella DellJ. Lane Mateja Dier, MD, FACEP  CSN: [id number] MRN: [id number] ARRIVAL: 11/13/16 at 0422 ROOM: MH02/MH02   CHIEF COMPLAINT  Abdominal Pain   HISTORY OF PRESENT ILLNESS  11/13/16 4:37 AM Christine Trujillo is a 21 y.o. female who [facility] July [facility] for left lower abdominal pain. She had a CT of the abdomen and pelvis as well as pelvic ultrasound which showed a cystic lesion in the right midabdomen likely arising from the right ovary. There was also the incidental finding of cholelithiasis without evidence of inflammation.  She is here with right upper quadrant pain that began about 30 minutes prior to arrival after eating 2 hotdogs. The pain is sharp and described as severe. It is worse with movement or palpation. There is been associated nausea and vomiting. She has not had diarrhea with it. This pain is in a different location and of a different nature than the pain she was evaluated for on the 31st. She tried taking Zofran ODT without relief of her nausea.  She is currently on Keflex for urinary tract infection.  Past Medical History:  Diagnosis Date  . Diabetes mellitus without complication (HCC)   . Gallstone   . Gestational diabetes    glyburide  . Headache   . Migraine   . Obesity   . Paratubal cyst 02/19/2016    Past Surgical History:  Procedure Laterality Date  . WISDOM TOOTH EXTRACTION      Family History  Problem Relation Age of Onset  . Diabetes Mother   . Heart disease Mother        CHF    Social History  Substance Use Topics  . Smoking status: Never Smoker  . Smokeless tobacco: Never Used  . Alcohol use No    Prior to Admission medications   Medication Sig Start Date End Date Taking? Authorizing Provider  cephALEXin (KEFLEX) 500 MG capsule Take 500 mg by mouth 4 (four) times daily.   Yes [provider]  dicyclomine (BENTYL) 20 MG tablet Take 20 mg by mouth 2 (two) times daily at 10  AM and 5 PM.    Yes [provider]  ondansetron (ZOFRAN-ODT) 4 MG disintegrating tablet Take 4 mg by mouth every 8 (eight) hours as needed for nausea or vomiting.   Yes [provider]  medroxyPROGESTERone (DEPO-PROVERA) 150 MG/ML injection Inject 1 mL (150 mg total) into the muscle every 3 (three) months. 11/12/16   Levie HeritageStinson, Jacob J, DO    Allergies Patient has no known allergies.   REVIEW OF SYSTEMS  Negative except as noted here or in the History of Present Illness.   PHYSICAL EXAMINATION  Initial Vital Signs Blood pressure (!) 128/94, pulse 82, temperature 98.4 F (36.9 C), temperature source Oral, resp. rate 20, height 5\' 4"  (1.626 m), weight 115.2 kg (254 lb), SpO2 98 %, not currently breastfeeding.  Examination General: Well-developed, obese female in no acute distress; appearance consistent with age of record HENT: normocephalic; atraumatic Eyes: pupils equal, round and reactive to light; extraocular muscles intact Neck: supple Heart: regular rate and rhythm Lungs: clear to auscultation bilaterally Abdomen: soft; obese; right upper quadrant tenderness with positive Murphy sign; bowel sounds present Extremities: No deformity; full range of motion; pulses normal Neurologic: Awake, alert and oriented; motor function intact in all extremities and symmetric; no facial droop Skin: Warm and dry Psychiatric: Grimacing   RESULTS  Summary of this visit's results, reviewed by myself:  EKG Interpretation  Date/Time:    Ventricular Rate:    PR Interval:    QRS Duration:   QT Interval:    QTC Calculation:   R Axis:     Text Interpretation:        Laboratory Studies: Results for orders placed or performed during the hospital encounter of 11/13/16 (from the past 24 hour(s))  CBC with Differential/Platelet     Status: Abnormal   Collection Time: 11/13/16  4:32 AM  Result Value Ref Range   WBC 8.5 4.0 - 10.5 K/uL   RBC 4.55 3.87 - 5.11 MIL/uL    Hemoglobin 10.8 (L) 12.0 - 15.0 g/dL   HCT 16.133.7 (L) 09.636.0 - 04.546.0 %   MCV 74.1 (L) 78.0 - 100.0 fL   MCH 23.7 (L) 26.0 - 34.0 pg   MCHC 32.0 30.0 - 36.0 g/dL   RDW 40.917.1 (H) 81.111.5 - 91.415.5 %   Platelets 277 150 - 400 K/uL   Neutrophils Relative % 56 %   Neutro Abs 4.7 1.7 - 7.7 K/uL   Lymphocytes Relative 34 %   Lymphs Abs 2.9 0.7 - 4.0 K/uL   Monocytes Relative 8 %   Monocytes Absolute 0.7 0.1 - 1.0 K/uL   Eosinophils Relative 2 %   Eosinophils Absolute 0.2 0.0 - 0.7 K/uL   Basophils Relative 0 %   Basophils Absolute 0.0 0.0 - 0.1 K/uL  Comprehensive metabolic panel     Status: Abnormal   Collection Time: 11/13/16  4:32 AM  Result Value Ref Range   Sodium 137 135 - 145 mmol/L   Potassium 3.4 (L) 3.5 - 5.1 mmol/L   Chloride 106 101 - 111 mmol/L   CO2 22 22 - 32 mmol/L   Glucose, Bld 93 65 - 99 mg/dL   BUN 11 6 - 20 mg/dL   Creatinine, Ser 7.820.92 0.44 - 1.00 mg/dL   Calcium 8.8 (L) 8.9 - 10.3 mg/dL   Total Protein 6.9 6.5 - 8.1 g/dL   Albumin 3.8 3.5 - 5.0 g/dL   AST 21 15 - 41 U/L   ALT 14 14 - 54 U/L   Alkaline Phosphatase 74 38 - 126 U/L   Total Bilirubin 0.5 0.3 - 1.2 mg/dL   GFR calc non Af Amer >60 >60 mL/min   GFR calc Af Amer >60 >60 mL/min   Anion gap 9 5 - 15  Lipase, blood     Status: None   Collection Time: 11/13/16  4:32 AM  Result Value Ref Range   Lipase 31 11 - 51 U/L   Imaging Studies: No results found.  ED COURSE  Nursing notes and initial vitals signs, including pulse oximetry, reviewed.  Vitals:   11/13/16 0428  BP: (!) 128/94  Pulse: 82  Resp: 20  Temp: 98.4 F (36.9 C)  TempSrc: Oral  SpO2: 98%  Weight: 115.2 kg (254 lb)  Height: 5\' 4"  (1.626 m)   5:24 AM Patient now pain-free. Abdomen is soft and nontender. Symptoms and history are consistent with biliary colic. We will have her return later today for an ultrasound. She has seen a Development worker, international aidgeneral surgeon and was to have an ultrasound but this has not yet been done. The surgeon's note discusses  elective cholecystectomy but this has not yet been scheduled.   PROCEDURES    ED DIAGNOSES     ICD-10-CM   1. Biliary colic K80.50        Angeleigh Chiasson, Jonny RuizJohn, MD 11/13/16 [phone]

## 2016-11-13 NOTE — ED Triage Notes (Signed)
Pt c/o right upper abd pain. Pt dx with gallstones this week. Pt reports eating 2 hot dogs just prior to pain starting.

## 2016-11-15 ENCOUNTER — Ambulatory Visit (INDEPENDENT_AMBULATORY_CARE_PROVIDER_SITE_OTHER): Payer: Medicaid Other | Admitting: Family Medicine

## 2016-11-15 ENCOUNTER — Encounter: Payer: Self-pay | Admitting: Family Medicine

## 2016-11-15 VITALS — BP 130/86 | HR 79 | Temp 98.2°F | Ht 63.0 in | Wt 251.4 lb

## 2016-11-15 DIAGNOSIS — Z0001 Encounter for general adult medical examination with abnormal findings: Secondary | ICD-10-CM

## 2016-11-15 DIAGNOSIS — E876 Hypokalemia: Secondary | ICD-10-CM

## 2016-11-15 DIAGNOSIS — K8050 Calculus of bile duct without cholangitis or cholecystitis without obstruction: Secondary | ICD-10-CM | POA: Diagnosis not present

## 2016-11-15 DIAGNOSIS — Z0000 Encounter for general adult medical examination without abnormal findings: Secondary | ICD-10-CM

## 2016-11-15 MED ORDER — DICYCLOMINE HCL 20 MG PO TABS: 20.0000 mg | ORAL_TABLET | Freq: Three times a day (TID) | ORAL | 1 refills | Status: DC

## 2016-11-15 NOTE — Progress Notes (Signed)
Chief Complaint  Patient presents with  . Establish Care    Patient is here today to establish care.     Well Woman Christine Trujillo is here for a complete physical.   Her last physical was >1 year ago.  Current diet: in general, a "healthy" diet . Current exercise: walks around house Weight is stable and she denies daytime fatigue. No LMP recorded. Patient has had an injection..  Follow with a Dentist? No  Seatbelt? Yes  Health Maintenance Pap/HPV- Yes Tetanus- Yes HIV- Yes  GC/Chlamydia-Yes 09/2016  Past Medical History:  Diagnosis Date  . Diabetes mellitus without complication (HCC)   . Gallstone   . Gestational diabetes    glyburide  . Headache   . Migraine   . Obesity   . Paratubal cyst 02/19/2016    Past Surgical History:  Procedure Laterality Date  . WISDOM TOOTH EXTRACTION     Medications  Current Outpatient Prescriptions on File Prior to Visit  Medication Sig Dispense Refill  . medroxyPROGESTERone (DEPO-PROVERA) 150 MG/ML injection Inject 1 mL (150 mg total) into the muscle every 3 (three) months. 1 mL 3   Allergies No Known Allergies  Review of Systems: Constitutional:  no unexpected change in weight, no weakness, no unexplained fevers, sweats, or chills Eye:  no recent significant change in vision Ear/Nose/Mouth/Throat:  Ears:  no tinnitus or vertigo and no recent change in hearing, Nose/Mouth/Throat:  no complaints of nasal congestion or discharge, no sore throat and no recent change in voice or hoarseness Cardiovascular:  no exercise intolerance, no chest pain, no palpitations Respiratory:  no chronic cough, sputum, or hemoptysis and no shortness of breath Gastrointestinal:  +abdominal pain, no change in bowel habits, no significant change in appetite, no nausea, vomiting, diarrhea, or constipation and no black or bloody stool GU:  Female: negative for dysuria, frequency, and incontinence, Normal menses; no abnormal bleeding, pelvic pain, or  discharge Musculoskeletal/Extremities:  no pain, redness, or swelling of the joints Integumentary (Skin/Breast):  no abnormal skin lesions reported, no new breast lumps or masses Neurologic:  no chronic headaches, no numbness, tingling, or tremor Psychiatric:  no anxiety, no depression Endocrine:  denies fatigue, weight changes, heat/cold intolerance, bowel or skin changes, or cardiovascular system symptoms Hematologic/Lymphatic:  no abnormal bleeding, no HIV risk factors, no night sweats, no swollen nodes, no weight loss Allergic/Immunologic:  no history of food or environmental allergies  Exam BP 130/86 (BP Location: Left Arm, Patient Position: Sitting, Cuff Size: Large)   Pulse 79   Temp 98.2 F (36.8 C) (Oral)   Ht 5\' 3"  (1.6 m)   Wt 251 lb 6.4 oz (114 kg)   SpO2 98%   BMI 44.53 kg/m  General:  well developed, well nourished, in no apparent distress Skin:  no significant moles, warts, or growths Head:  no masses, lesions, or tenderness Eyes:  pupils equal and round, sclera anicteric without injection Ears:  canals without lesions, TMs shiny without retraction, no obvious effusion, no erythema Nose:  nares patent, septum midline, mucosa normal, and no drainage or sinus tenderness Throat/Pharynx:  lips and gingiva without lesion; tongue and uvula midline; non-inflamed pharynx; no exudates or postnasal drainage Neck: neck supple without adenopathy, thyromegaly, or masses Thorax:  nontender Lungs:  clear to auscultation, breath sounds equal bilaterally, no respiratory distress Cardio:  regular rate and rhythm without murmurs, heart sounds without clicks or rubs, point of maximal impulse normal; no lifts, heaves, or thrills Abdomen:  abdomen soft, TTP in RUQ, +  Murphy's on my exam; bowel sounds normal; no masses or organomegaly Genital: Female:  Musculoskeletal:  symmetrical muscle groups noted without atrophy or deformity Extremities:  no clubbing, cyanosis, or edema, no deformities,  no skin discoloration Neuro:  gait normal; deep tendon reflexes normal and symmetric Psych: well oriented with normal range of affect and appropriate judgment/insight  Assessment and Plan  Well adult exam - Plan: Lipid panel  Biliary colic - Plan: Ambulatory referral to General Surgery, dicyclomine (BENTYL) 20 MG tablet  Hypokalemia - Plan: Basic metabolic panel   Well 21 y.o. female. Counseled on diet and exercise. Healthy diet handout given. Refer GS and rx for Bentyl given cramping type pain. Recheck lytes given hypokalemia.  Other orders as above. Follow up 1 yr or prn. The patient voiced understanding and agreement to the plan.  Jilda Rocheicholas Paul [place], DO 11/15/16 3:16 PM

## 2016-11-15 NOTE — Patient Instructions (Signed)
If you do not hear anything about your referral in the next 1-2 weeks, call our office and ask for an update.  Give us 2-3 business days to get the results of your labs back.   Healthy Eating Plan Many factors influence your heart health, including eating and exercise habits. Heart (coronary) risk increases with abnormal blood fat (lipid) levels. Heart-healthy meal planning includes limiting unhealthy fats, increasing healthy fats, and making other small dietary changes. This includes maintaining a healthy body weight to help keep lipid levels within a normal range.  WHAT IS MY PLAN?  Your health care provider recommends that you:  Drink a glass of water before meals to help with satiety.  Eat slowly.  An alternative to the water is to add Metamucil. This will help with satiety as well. It does contain calories, unlike water.  WHAT TYPES OF FAT SHOULD I CHOOSE?  Choose healthy fats more often. Choose monounsaturated and polyunsaturated fats, such as olive oil and canola oil, flaxseeds, walnuts, almonds, and seeds.  Eat more omega-3 fats. Good choices include salmon, mackerel, sardines, tuna, flaxseed oil, and ground flaxseeds. Aim to eat fish at least two times each week.  Avoid foods with partially hydrogenated oils in them. These contain trans fats. Examples of foods that contain trans fats are stick margarine, some tub margarines, cookies, crackers, and other baked goods. If you are going to avoid a fat, this is the one to avoid!  WHAT GENERAL GUIDELINES DO I NEED TO FOLLOW?  Check food labels carefully to identify foods with trans fats. Avoid these types of options when possible.  Fill one half of your plate with vegetables and green salads. Eat 4-5 servings of vegetables per day. A serving of vegetables equals 1 cup of raw leafy vegetables,  cup of raw or cooked cut-up vegetables, or  cup of vegetable juice.  Fill one fourth of your plate with whole grains. Look for the word  "whole" as the first word in the ingredient list.  Fill one fourth of your plate with lean protein foods.  Eat 4-5 servings of fruit per day. A serving of fruit equals one medium whole fruit,  cup of dried fruit,  cup of fresh, frozen, or canned fruit. Try to avoid fruits in cups/syrups as the sugar content can be high.  Eat more foods that contain soluble fiber. Examples of foods that contain this type of fiber are apples, broccoli, carrots, beans, peas, and barley. Aim to get 20-30 g of fiber per day.  Eat more home-cooked food and less restaurant, buffet, and fast food.  Limit or avoid alcohol.  Limit foods that are high in starch and sugar.  Avoid fried foods when able.  Cook foods by using methods other than frying. Baking, boiling, grilling, and broiling are all great options. Other fat-reducing suggestions include: ? Removing the skin from poultry. ? Removing all visible fats from meats. ? Skimming the fat off of stews, soups, and gravies before serving them. ? Steaming vegetables in water or broth.  Lose weight if you are overweight. Losing just 5-10% of your initial body weight can help your overall health and prevent diseases such as diabetes and heart disease.  Increase your consumption of nuts, legumes, and seeds to 4-5 servings per week. One serving of dried beans or legumes equals  cup after being cooked, one serving of nuts equals 1 ounces, and one serving of seeds equals  ounce or 1 tablespoon.  WHAT ARE GOOD FOODS CAN   I EAT? Grains Grainy breads (try to find bread that is 3 g of fiber per slice or greater), oatmeal, light popcorn. Whole-grain cereals. Rice and pasta, including brown rice and those that are made with whole wheat. Edamame pasta is a great alternative to grain pasta. It has a higher protein content. Try to avoid significant consumption of white bread, sugary cereals, or pastries/baked goods.  Vegetables All vegetables. Cooked white potatoes do not  count as vegetables.  Fruits All fruits, but limit pineapple and bananas as these fruits have a higher sugar content.  Meats and Other Protein Sources Lean, well-trimmed beef, veal, pork, and lamb. Chicken and turkey without skin. All fish and shellfish. Wild duck, rabbit, pheasant, and venison. Egg whites or low-cholesterol egg substitutes. Dried beans, peas, lentils, and tofu.Seeds and most nuts.  Dairy Low-fat or nonfat cheeses, including ricotta, string, and mozzarella. Skim or 1% milk that is liquid, powdered, or evaporated. Buttermilk that is made with low-fat milk. Nonfat or low-fat yogurt. Soy/Almond milk are good alternatives if you cannot handle dairy.  Beverages Water is the best for you. Sports drinks with less sugar are more desirable unless you are a highly active athlete.  Sweets and Desserts Sherbets and fruit ices. Honey, jam, marmalade, jelly, and syrups. Dark chocolate.  Eat all sweets and desserts in moderation.  Fats and Oils Nonhydrogenated (trans-free) margarines. Vegetable oils, including soybean, sesame, sunflower, olive, peanut, safflower, corn, canola, and cottonseed. Salad dressings or mayonnaise that are made with a vegetable oil. Limit added fats and oils that you use for cooking, baking, salads, and as spreads.  Other Cocoa powder. Coffee and tea. Most condiments.  The items listed above may not be a complete list of recommended foods or beverages. Contact your dietitian for more options.   

## 2016-11-16 LAB — BASIC METABOLIC PANEL
BUN: 7 mg/dL (ref 6–23)
CHLORIDE: 105 meq/L (ref 96–112)
CO2: 25 mEq/L (ref 19–32)
Calcium: 9.3 mg/dL (ref 8.4–10.5)
Creatinine, Ser: 0.95 mg/dL (ref 0.40–1.20)
GFR: 94.52 mL/min (ref >60.00)
Glucose, Bld: 77 mg/dL (ref 70–99)
Potassium: 4 mEq/L (ref 3.5–5.1)
SODIUM: 139 meq/L (ref 135–145)

## 2016-11-16 LAB — LIPID PANEL
CHOLESTEROL: 206 mg/dL — AB (ref 0–200)
HDL: 42.9 mg/dL (ref >39.00)
LDL Cholesterol: 141 mg/dL — ABNORMAL HIGH (ref 0–99)
NonHDL: 163.2
TRIGLYCERIDES: 111 mg/dL (ref 0.0–149.0)
Total CHOL/HDL Ratio: 5
VLDL: 22.2 mg/dL (ref 0.0–40.0)

## 2016-11-26 ENCOUNTER — Other Ambulatory Visit: Payer: Self-pay

## 2016-11-30 ENCOUNTER — Other Ambulatory Visit: Payer: Self-pay | Admitting: Surgery

## 2016-11-30 ENCOUNTER — Ambulatory Visit: Payer: Self-pay | Admitting: Surgery

## 2016-11-30 ENCOUNTER — Other Ambulatory Visit: Payer: Self-pay

## 2016-11-30 ENCOUNTER — Encounter (HOSPITAL_COMMUNITY): Payer: Self-pay | Admitting: *Deleted

## 2016-11-30 NOTE — Patient Instructions (Addendum)
Laquia Wimpy  11/30/2016   Your procedure is scheduled on: 12/02/2016    Report to [facility] Main  Entrance Take [place]  elevators to 3rd floor to  Short Stay Center at   0730 AM.     Call this number if you have problems the morning of surgery [phone]    Remember: ONLY 1 PERSON MAY GO WITH YOU TO SHORT STAY TO GET  READY MORNING OF YOUR SURGERY.  Do not eat food or drink liquids :After Midnight.     Take these medicines the morning of surgery with A SIP OF WATER: none                                 You may not have any metal on your body including hair pins and              piercings  Do not wear jewelry, make-up, lotions, powders or perfumes, deodorant             Do not wear nail polish.  Do not shave  48 hours prior to surgery.     Do not bring valuables to the hospital. [place] IS NOT             RESPONSIBLE   FOR VALUABLES.  Contacts, dentures or bridgework may not be worn into surgery.  .     Patients discharged the day of surgery will not be allowed to drive home.  Name and phone number of your driver:                Please read over the following fact sheets you were given: _____________________________________________________________________             [facility] - Preparing for Surgery Before surgery, you can play an important role.  Because skin is not sterile, your skin needs to be as free of germs as possible.  You can reduce the number of germs on your skin by washing with CHG (chlorahexidine gluconate) soap before surgery.  CHG is an antiseptic cleaner which kills germs and bonds with the skin to continue killing germs even after washing. Please DO NOT use if you have an allergy to CHG or antibacterial soaps.  If your skin becomes reddened/irritated stop using the CHG and inform your nurse when you arrive at Short Stay. Do not shave (including legs and underarms) for at least 48 hours prior to the first CHG shower.   You may shave your face/neck. Please follow these instructions carefully:  1.  Shower with CHG Soap the night before surgery and the  morning of Surgery.  2.  If you choose to wash your hair, wash your hair first as usual with your  normal  shampoo.  3.  After you shampoo, rinse your hair and body thoroughly to remove the  shampoo.                           4.  Use CHG as you would any other liquid soap.  You can apply chg directly  to the skin and wash                       Gently with a scrungie or clean washcloth.  5.  Apply the CHG  Soap to your body ONLY FROM THE NECK DOWN.   Do not use on face/ open                           Wound or open sores. Avoid contact with eyes, ears mouth and genitals (private parts).                       Wash face,  Genitals (private parts) with your normal soap.             6.  Wash thoroughly, paying special attention to the area where your surgery  will be performed.  7.  Thoroughly rinse your body with warm water from the neck down.  8.  DO NOT shower/wash with your normal soap after using and rinsing off  the CHG Soap.                9.  Pat yourself dry with a clean towel.            10.  Wear clean pajamas.            11.  Place clean sheets on your bed the night of your first shower and do not  sleep with pets. Day of Surgery : Do not apply any lotions/deodorants the morning of surgery.  Please wear clean clothes to the hospital/surgery center.  FAILURE TO FOLLOW THESE INSTRUCTIONS MAY RESULT IN THE CANCELLATION OF YOUR SURGERY PATIENT SIGNATURE_________________________________  NURSE SIGNATURE__________________________________  ________________________________________________________________________

## 2016-11-30 NOTE — Progress Notes (Signed)
NEED ORDERS IN EPIC FOR 8-23 SURGERY

## 2016-12-01 ENCOUNTER — Encounter (HOSPITAL_COMMUNITY)
Admission: RE | Admit: 2016-12-01 | Discharge: 2016-12-01 | Disposition: A | Discharge status: Home / Self Care | Payer: Medicaid Other | Source: Ambulatory Visit | Attending: Surgery | Admitting: Surgery

## 2016-12-01 ENCOUNTER — Encounter (HOSPITAL_COMMUNITY): Payer: Self-pay

## 2016-12-01 DIAGNOSIS — Z01812 Encounter for preprocedural laboratory examination: Secondary | ICD-10-CM | POA: Diagnosis present

## 2016-12-01 DIAGNOSIS — K829 Disease of gallbladder, unspecified: Secondary | ICD-10-CM | POA: Insufficient documentation

## 2016-12-01 HISTORY — DX: Major depressive disorder, single episode, unspecified: F32.9

## 2016-12-01 HISTORY — DX: Anxiety disorder, unspecified: F41.9

## 2016-12-01 HISTORY — DX: Gastro-esophageal reflux disease without esophagitis: K21.9

## 2016-12-01 HISTORY — DX: Depression, unspecified: F32.A

## 2016-12-01 HISTORY — DX: Anemia, unspecified: D64.9

## 2016-12-01 LAB — CBC
HCT: 33.8 % — ABNORMAL LOW (ref 36.0–46.0)
HEMOGLOBIN: 11 g/dL — AB (ref 12.0–15.0)
MCH: 23.7 pg — AB (ref 26.0–34.0)
MCHC: 32.5 g/dL (ref 30.0–36.0)
MCV: 72.8 fL — AB (ref 78.0–100.0)
Platelets: 322 10*3/uL (ref 150–400)
RBC: 4.64 MIL/uL (ref 3.87–5.11)
RDW: 17 % — ABNORMAL HIGH (ref 11.5–15.5)
WBC: 7.8 10*3/uL (ref 4.0–10.5)

## 2016-12-01 LAB — HCG, SERUM, QUALITATIVE: PREG SERUM: NEGATIVE

## 2016-12-01 NOTE — H&P (Signed)
[facility] [place] 11/26/2016 4:01 PM Location: [facility] Patient #: [id number] DOB: 04/16/1995 Single / Language: [place] / Race: Black or African American Female  History of Present Illness  The patient is a 21 year old female who presents with a complaint of gall bladder disease.  The PCP is Dr. Waverly Ferrari ([place] [facility]) The patient was referred by Dr. Waverly Ferrari. She has her Mariel Sleet, in the room.  The patient has had some abdominal symptoms for several months. She actually since began last August, 2017. She had some nausea and vomiting during her pregnancy. She's had no fever. No jaundice. Her mother had gallbladder disease. She said that her symptoms are worsened by eating fried foods. She's had no prior abdominal surgery. She had an [place] on 11/13/2016 that showed gall stones. She had her first child, girl, on 10/11/2016. She is not breast feeding.  I discussed with the patient the indications and risks of gall bladder surgery. The primary risks of gall bladder surgery include, but are not limited to, bleeding, infection, common bile duct injury, and open surgery. There is also the risk that the patient may have continued symptoms after surgery. We discussed the typical post-operative recovery course. I tried to answer the patient's questions. I gave the patient literature about gall bladder surgery.  Past Medical History: 1. Gestational diabetes 2. History of UTI 3. Smoking black "something" - I missed what she said 4. Her hips "lock up" 5. She has a 16 cm cyst of the right ovary Being followed by Ob/Gyn at [facility] - Drs. Stenson/Dove Of note, on an [place] on 10/01/2016 - they specifically state that they do not see a right ovarian cyst.  Social History:  Mariel Sleet. (he noted that he has had 4 children by 4 other women) Girl born 10/11/2016 - "Heide Guile"  She is not  working.   Past Surgical History Christianne Dolin, [place]; 11/26/2016 4:01 PM) Oral Surgery   Diagnostic Studies History Christianne Dolin, [place]; 11/26/2016 4:01 PM) Mammogram  never Pap Smear  1-5 years ago  Medication History Christianne Dolin, RMA; 11/26/2016 4:03 PM) MedroxyPROGESTERone Acetate (150MG /ML Suspension, Intramuscular) Active. Bentyl (20MG  Tablet, Oral) Active. Medications Reconciled  Social History Christianne Dolin, [place]; 11/26/2016 4:01 PM) Caffeine use  Carbonated beverages, Tea. No alcohol use  No drug use  Tobacco use  Current every day smoker.  Family History Christianne Dolin, [place]; 11/26/2016 4:01 PM) Diabetes Mellitus  Mother. Heart Disease  Mother. Migraine Headache  Mother.  Pregnancy / Birth History Christianne Dolin, [place]; 11/26/2016 4:01 PM) Age at menarche  13 years. Age of menopause  <44 Contraceptive History  Depo-provera. Gravida  1 Irregular periods  Para  1  Other Problems Christianne Dolin, [place]; 11/26/2016 4:01 PM) Back Pain  Cholelithiasis  Depression  Migraine Headache     Review of Systems Christianne Dolin RMA; 11/26/2016 4:01 PM) General Present- Appetite Loss. Not Present- Chills, Fatigue, Fever, Night Sweats, Weight Gain and Weight Loss. Skin Not Present- Change in Wart/Mole, Dryness, Hives, Jaundice, New Lesions, Non-Healing Wounds, Rash and Ulcer. Respiratory Not Present- Bloody sputum, Chronic Cough, Difficulty Breathing, Snoring and Wheezing. Breast Present- Nipple Discharge. Not Present- Breast Mass, Breast Pain and Skin Changes. Cardiovascular Not Present- Chest Pain, Difficulty Breathing Lying Down, Leg Cramps, Palpitations, Rapid Heart Rate, Shortness of Breath and Swelling of Extremities. Gastrointestinal Present- Abdominal Pain. Not Present- Bloating, Bloody Stool, Change in Bowel Habits, Chronic diarrhea, Constipation, Difficulty Swallowing, Excessive gas, Gets full quickly at meals, Hemorrhoids,  Indigestion, Nausea, Rectal Pain and Vomiting. Female Genitourinary Present- Nocturia and Pelvic Pain. Not Present- Frequency, Painful Urination and Urgency. Musculoskeletal Present- Joint Stiffness. Not Present- Back Pain, Joint Pain, Muscle Pain, Muscle Weakness and Swelling of Extremities. Neurological Present- Headaches. Not Present- Decreased Memory, Fainting, Numbness, Seizures, Tingling, Tremor, Trouble walking and Weakness. Psychiatric Present- Change in Sleep Pattern and Depression. Not Present- Anxiety, Bipolar, Fearful and Frequent crying.  Vitals Christianne Dolin RMA; 11/26/2016 4:03 PM) 11/26/2016 4:03 PM Weight: 252 lb Height: 63in Body Surface Area: 2.13 m Body Mass Index: 44.64 kg/m  Temp.: 98.87F  Pulse: 93 (Regular)  BP: 140/100 (Sitting, Left Arm, Standard)   Physical Exam  General: moderately obese AA F alert and generally healthy appearing. HEENT: Normal. Pupils equal.  Neck: Supple. No mass. No thyroid mass.  Lymph Nodes: No supraclavicular or cervical nodes.  Lungs: Clear to auscultation and symmetric breath sounds. Heart: RRR. No murmur or rub.  Abdomen: Soft. No mass. No tenderness today, but she says that most of the time her pain has been in the RUQ. No hernia. Normal bowel sounds. No abdominal scars. Rectal: Not done.  Extremities: Good strength and ROM in upper and lower extremities.  Neurologic: Grossly intact to motor and sensory function. Psychiatric: Has normal mood and affect. Behavior is normal.   Assessment & Plan  1.  GALL BLADDER DISEASE (K82.9)  Plan:  1) Gall bladder surgery  2. Gestational diabetes 3. Smoking black "something" - I missed what she said 4. Her hips "lock up"  Ovidio Kin, MD, [facility] Pager: [phone] Office phone:  [phone]

## 2016-12-02 ENCOUNTER — Encounter (HOSPITAL_COMMUNITY)
Admission: RE | Disposition: A | Discharge status: Home / Self Care | Payer: Self-pay | Source: Ambulatory Visit | Attending: Surgery

## 2016-12-02 ENCOUNTER — Ambulatory Visit (HOSPITAL_COMMUNITY): Payer: Medicaid Other

## 2016-12-02 ENCOUNTER — Encounter (HOSPITAL_COMMUNITY): Payer: Self-pay | Admitting: *Deleted

## 2016-12-02 ENCOUNTER — Ambulatory Visit (HOSPITAL_COMMUNITY): Payer: Medicaid Other | Admitting: Anesthesiology

## 2016-12-02 ENCOUNTER — Ambulatory Visit (HOSPITAL_COMMUNITY)
Admission: RE | Admit: 2016-12-02 | Discharge: 2016-12-02 | Disposition: A | Discharge status: Home / Self Care | Payer: Medicaid Other | Source: Ambulatory Visit | Attending: Surgery | Admitting: Surgery

## 2016-12-02 DIAGNOSIS — K8010 Calculus of gallbladder with chronic cholecystitis without obstruction: Secondary | ICD-10-CM | POA: Diagnosis present

## 2016-12-02 DIAGNOSIS — Z419 Encounter for procedure for purposes other than remedying health state, unspecified: Secondary | ICD-10-CM

## 2016-12-02 DIAGNOSIS — Z79899 Other long term (current) drug therapy: Secondary | ICD-10-CM | POA: Diagnosis not present

## 2016-12-02 DIAGNOSIS — Z793 Long term (current) use of hormonal contraceptives: Secondary | ICD-10-CM | POA: Diagnosis not present

## 2016-12-02 DIAGNOSIS — Z791 Long term (current) use of non-steroidal anti-inflammatories (NSAID): Secondary | ICD-10-CM | POA: Diagnosis not present

## 2016-12-02 HISTORY — PX: CHOLECYSTECTOMY: SHX55

## 2016-12-02 SURGERY — LAPAROSCOPIC CHOLECYSTECTOMY WITH INTRAOPERATIVE CHOLANGIOGRAM
Anesthesia: General | Site: Abdomen

## 2016-12-02 MED ORDER — LIDOCAINE 2% (20 MG/ML) 5 ML SYRINGE
INTRAMUSCULAR | Status: AC
  Filled 2016-12-02: qty 5

## 2016-12-02 MED ORDER — CHLORHEXIDINE GLUCONATE CLOTH 2 % EX PADS: 6.0000 | MEDICATED_PAD | Freq: Once | CUTANEOUS | Status: DC

## 2016-12-02 MED ORDER — LIDOCAINE 2% (20 MG/ML) 5 ML SYRINGE
INTRAMUSCULAR | Status: DC | PRN
  Administered 2016-12-02: 75 mg via INTRAVENOUS

## 2016-12-02 MED ORDER — KETOROLAC TROMETHAMINE 30 MG/ML IJ SOLN
30.0000 mg | Freq: Once | INTRAMUSCULAR | Status: AC
  Administered 2016-12-02: 30 mg via INTRAVENOUS

## 2016-12-02 MED ORDER — LACTATED RINGERS IR SOLN
Status: DC | PRN
  Administered 2016-12-02: 1000 mL

## 2016-12-02 MED ORDER — 0.9 % SODIUM CHLORIDE (POUR BTL) OPTIME
TOPICAL | Status: DC | PRN
  Administered 2016-12-02: 1000 mL

## 2016-12-02 MED ORDER — HYDROMORPHONE HCL-NACL 0.5-0.9 MG/ML-% IV SOSY
0.2500 mg | PREFILLED_SYRINGE | INTRAVENOUS | Status: DC | PRN
  Administered 2016-12-02 (×4): 0.5 mg via INTRAVENOUS

## 2016-12-02 MED ORDER — KETOROLAC TROMETHAMINE 30 MG/ML IJ SOLN
INTRAMUSCULAR | Status: AC
  Filled 2016-12-02: qty 1

## 2016-12-02 MED ORDER — SUCCINYLCHOLINE CHLORIDE 200 MG/10ML IV SOSY
PREFILLED_SYRINGE | INTRAVENOUS | Status: DC | PRN
  Administered 2016-12-02: 120 mg via INTRAVENOUS

## 2016-12-02 MED ORDER — ROCURONIUM BROMIDE 50 MG/5ML IV SOSY
PREFILLED_SYRINGE | INTRAVENOUS | Status: AC
  Filled 2016-12-02: qty 5

## 2016-12-02 MED ORDER — MIDAZOLAM HCL 2 MG/2ML IJ SOLN
INTRAMUSCULAR | Status: DC | PRN
  Administered 2016-12-02: 2 mg via INTRAVENOUS

## 2016-12-02 MED ORDER — BUPIVACAINE-EPINEPHRINE (PF) 0.25% -1:200000 IJ SOLN
INTRAMUSCULAR | Status: AC
  Filled 2016-12-02: qty 30

## 2016-12-02 MED ORDER — HYDROCODONE-ACETAMINOPHEN 5-325 MG PO TABS: 1.0000 | ORAL_TABLET | Freq: Four times a day (QID) | ORAL | 0 refills | Status: DC | PRN

## 2016-12-02 MED ORDER — CEFAZOLIN SODIUM-DEXTROSE 2-4 GM/100ML-% IV SOLN
2.0000 g | INTRAVENOUS | Status: AC
  Administered 2016-12-02: 2 g via INTRAVENOUS
  Filled 2016-12-02: qty 100

## 2016-12-02 MED ORDER — ONDANSETRON HCL 4 MG/2ML IJ SOLN
INTRAMUSCULAR | Status: DC | PRN
  Administered 2016-12-02: 4 mg via INTRAVENOUS

## 2016-12-02 MED ORDER — FENTANYL CITRATE (PF) 100 MCG/2ML IJ SOLN
INTRAMUSCULAR | Status: DC | PRN
  Administered 2016-12-02 (×2): 100 ug via INTRAVENOUS

## 2016-12-02 MED ORDER — FENTANYL CITRATE (PF) 100 MCG/2ML IJ SOLN
INTRAMUSCULAR | Status: AC
  Filled 2016-12-02: qty 2

## 2016-12-02 MED ORDER — PROPOFOL 10 MG/ML IV BOLUS
INTRAVENOUS | Status: AC
  Filled 2016-12-02: qty 20

## 2016-12-02 MED ORDER — PROPOFOL 10 MG/ML IV BOLUS
INTRAVENOUS | Status: DC | PRN
  Administered 2016-12-02: 200 mg via INTRAVENOUS

## 2016-12-02 MED ORDER — ROCURONIUM BROMIDE 50 MG/5ML IV SOSY
PREFILLED_SYRINGE | INTRAVENOUS | Status: DC | PRN
  Administered 2016-12-02: 50 mg via INTRAVENOUS

## 2016-12-02 MED ORDER — HYDROCODONE-ACETAMINOPHEN 7.5-325 MG PO TABS
1.0000 | ORAL_TABLET | Freq: Once | ORAL | Status: AC | PRN
  Administered 2016-12-02: 1 via ORAL
  Filled 2016-12-02: qty 1

## 2016-12-02 MED ORDER — METOCLOPRAMIDE HCL 5 MG/ML IJ SOLN: 10.0000 mg | Freq: Once | INTRAMUSCULAR | Status: DC | PRN

## 2016-12-02 MED ORDER — SCOPOLAMINE 1 MG/3DAYS TD PT72
MEDICATED_PATCH | TRANSDERMAL | Status: AC
  Filled 2016-12-02: qty 1

## 2016-12-02 MED ORDER — IOPAMIDOL (ISOVUE-300) INJECTION 61%
INTRAVENOUS | Status: DC | PRN
  Administered 2016-12-02: 50 mL via INTRAVENOUS

## 2016-12-02 MED ORDER — ACETAMINOPHEN 500 MG PO TABS
1000.0000 mg | ORAL_TABLET | ORAL | Status: AC
  Administered 2016-12-02: 1000 mg via ORAL
  Filled 2016-12-02: qty 2

## 2016-12-02 MED ORDER — IOPAMIDOL (ISOVUE-300) INJECTION 61%
INTRAVENOUS | Status: AC
  Filled 2016-12-02: qty 50

## 2016-12-02 MED ORDER — GABAPENTIN 300 MG PO CAPS
300.0000 mg | ORAL_CAPSULE | ORAL | Status: AC
  Administered 2016-12-02: 300 mg via ORAL
  Filled 2016-12-02: qty 1

## 2016-12-02 MED ORDER — SCOPOLAMINE 1 MG/3DAYS TD PT72
1.0000 | MEDICATED_PATCH | TRANSDERMAL | Status: DC
  Administered 2016-12-02: 1.5 mg via TRANSDERMAL

## 2016-12-02 MED ORDER — MEPERIDINE HCL 50 MG/ML IJ SOLN: 6.2500 mg | INTRAMUSCULAR | Status: DC | PRN

## 2016-12-02 MED ORDER — DEXAMETHASONE SODIUM PHOSPHATE 10 MG/ML IJ SOLN
INTRAMUSCULAR | Status: DC | PRN
  Administered 2016-12-02: 10 mg via INTRAVENOUS

## 2016-12-02 MED ORDER — SUGAMMADEX SODIUM 200 MG/2ML IV SOLN
INTRAVENOUS | Status: DC | PRN
  Administered 2016-12-02: 100 mg via INTRAVENOUS
  Administered 2016-12-02: 200 mg via INTRAVENOUS

## 2016-12-02 MED ORDER — DEXAMETHASONE SODIUM PHOSPHATE 10 MG/ML IJ SOLN
INTRAMUSCULAR | Status: AC
  Filled 2016-12-02: qty 1

## 2016-12-02 MED ORDER — LACTATED RINGERS IV SOLN
INTRAVENOUS | Status: DC
Start: 2016-12-02 — End: 2016-12-02
  Administered 2016-12-02 (×2): via INTRAVENOUS

## 2016-12-02 MED ORDER — MIDAZOLAM HCL 2 MG/2ML IJ SOLN
INTRAMUSCULAR | Status: AC
  Filled 2016-12-02: qty 2

## 2016-12-02 MED ORDER — LACTATED RINGERS IV SOLN
INTRAVENOUS | Status: DC
Start: 2016-12-02 — End: 2016-12-02

## 2016-12-02 MED ORDER — BUPIVACAINE-EPINEPHRINE 0.25% -1:200000 IJ SOLN
INTRAMUSCULAR | Status: DC | PRN
Start: 2016-12-02 — End: 2016-12-02
  Administered 2016-12-02: 10 mL

## 2016-12-02 MED ORDER — SUGAMMADEX SODIUM 200 MG/2ML IV SOLN
INTRAVENOUS | Status: AC
  Filled 2016-12-02: qty 2

## 2016-12-02 MED ORDER — HYDROMORPHONE HCL-NACL 0.5-0.9 MG/ML-% IV SOSY
PREFILLED_SYRINGE | INTRAVENOUS | Status: AC
  Administered 2016-12-02: 0.5 mg via INTRAVENOUS
  Filled 2016-12-02: qty 4

## 2016-12-02 MED ORDER — ONDANSETRON HCL 4 MG/2ML IJ SOLN
INTRAMUSCULAR | Status: AC
  Filled 2016-12-02: qty 2

## 2016-12-02 MED ORDER — SUCCINYLCHOLINE CHLORIDE 200 MG/10ML IV SOSY
PREFILLED_SYRINGE | INTRAVENOUS | Status: AC
  Filled 2016-12-02: qty 10

## 2016-12-02 SURGICAL SUPPLY — 40 items
APPLIER CLIP 5 13 M/L LIGAMAX5 (MISCELLANEOUS) ×3
APPLIER CLIP ROT 10 11.4 M/L (STAPLE)
BENZOIN TINCTURE PRP APPL 2/3 (GAUZE/BANDAGES/DRESSINGS) IMPLANT
CABLE HIGH FREQUENCY MONO STRZ (ELECTRODE) ×3 IMPLANT
CHLORAPREP W/TINT 26ML (MISCELLANEOUS) ×3 IMPLANT
CHOLANGIOGRAM CATH TAUT (CATHETERS) ×3 IMPLANT
CLIP APPLIE 5 13 M/L LIGAMAX5 (MISCELLANEOUS) ×1 IMPLANT
CLIP APPLIE ROT 10 11.4 M/L (STAPLE) IMPLANT
CLOSURE WOUND 1/4X4 (GAUZE/BANDAGES/DRESSINGS)
COVER MAYO STAND STRL (DRAPES) ×3 IMPLANT
COVER SURGICAL LIGHT HANDLE (MISCELLANEOUS) ×3 IMPLANT
DECANTER SPIKE VIAL GLASS SM (MISCELLANEOUS) IMPLANT
DERMABOND ADVANCED (GAUZE/BANDAGES/DRESSINGS) ×2
DERMABOND ADVANCED .7 DNX12 (GAUZE/BANDAGES/DRESSINGS) ×1 IMPLANT
DRAPE C-ARM 42X120 X-RAY (DRAPES) ×3 IMPLANT
ELECT REM PT RETURN 15FT ADLT (MISCELLANEOUS) ×3 IMPLANT
GLOVE SURG SIGNA 7.5 PF LTX (GLOVE) ×3 IMPLANT
GOWN STRL REUS W/TWL XL LVL3 (GOWN DISPOSABLE) ×9 IMPLANT
HEMOSTAT SURGICEL 4X8 (HEMOSTASIS) IMPLANT
HOVERMATT SINGLE USE (MISCELLANEOUS) ×3 IMPLANT
IV CATH 14GX2 1/4 (CATHETERS) ×3 IMPLANT
IV SET EXTENSION CATH 6 NF (IV SETS) ×3 IMPLANT
KIT BASIN OR (CUSTOM PROCEDURE TRAY) ×3 IMPLANT
POUCH RETRIEVAL ECOSAC 10 (ENDOMECHANICALS) ×1 IMPLANT
POUCH RETRIEVAL ECOSAC 10MM (ENDOMECHANICALS) ×2
POUCH SPECIMEN RETRIEVAL 10MM (ENDOMECHANICALS) IMPLANT
SCISSORS LAP 5X35 DISP (ENDOMECHANICALS) ×3 IMPLANT
SET TUBE IRRIG SUCTION NO TIP (IRRIGATION / IRRIGATOR) ×3 IMPLANT
SLEEVE ADV FIXATION 5X100MM (TROCAR) ×3 IMPLANT
STOPCOCK 4 WAY LG BORE MALE ST (IV SETS) ×3 IMPLANT
STRIP CLOSURE SKIN 1/4X4 (GAUZE/BANDAGES/DRESSINGS) IMPLANT
SUT MNCRL AB 4-0 PS2 18 (SUTURE) ×3 IMPLANT
SYR 10ML ECCENTRIC (SYRINGE) ×3 IMPLANT
TOWEL OR 17X26 10 PK STRL BLUE (TOWEL DISPOSABLE) ×3 IMPLANT
TOWEL OR NON WOVEN STRL DISP B (DISPOSABLE) ×3 IMPLANT
TRAY LAPAROSCOPIC (CUSTOM PROCEDURE TRAY) ×3 IMPLANT
TROCAR ADV FIXATION 11X100MM (TROCAR) ×3 IMPLANT
TROCAR ADV FIXATION 5X100MM (TROCAR) ×3 IMPLANT
TROCAR XCEL BLUNT TIP 100MML (ENDOMECHANICALS) ×3 IMPLANT
TUBING INSUF HEATED (TUBING) ×3 IMPLANT

## 2016-12-02 NOTE — Transfer of Care (Signed)
Immediate Anesthesia Transfer of Care Note  Patient: [name]  Procedure(s) Performed: Procedure(s): LAPAROSCOPIC CHOLECYSTECTOMY WITH INTRAOPERATIVE CHOLANGIOGRAM (N/A)  Patient Location: PACU  Anesthesia Type:General  Level of Consciousness: drowsy and patient cooperative  Airway & Oxygen Therapy: Patient Spontanous Breathing and Patient connected to face mask oxygen  Post-op Assessment: Report given to RN and Post -op Vital signs reviewed and stable  Post vital signs: Reviewed and stable  Last Vitals:  Vitals:   12/02/16 0713  BP: 131/76  Pulse: 71  Resp: 16  Temp: 36.6 C  SpO2: 100%    Last Pain:  Vitals:   12/02/16 0729  TempSrc:   PainSc: 4       Patients Stated Pain Goal: 4 (12/02/16 0729)  Complications: No apparent anesthesia complications

## 2016-12-02 NOTE — Anesthesia Postprocedure Evaluation (Signed)
Anesthesia Post Note  Patient: Christine Trujillo  Procedure(s) Performed: Procedure(s) (LRB): LAPAROSCOPIC CHOLECYSTECTOMY WITH INTRAOPERATIVE CHOLANGIOGRAM (N/A)     Patient location during evaluation: PACU Anesthesia Type: General Level of consciousness: awake and alert and oriented Pain management: pain level controlled Vital Signs Assessment: post-procedure vital signs reviewed and stable Respiratory status: spontaneous breathing, nonlabored ventilation and respiratory function stable Cardiovascular status: blood pressure returned to baseline and stable Postop Assessment: no signs of nausea or vomiting Anesthetic complications: no    Last Vitals:  Vitals:   12/02/16 1236 12/02/16 1316  BP: 119/71 125/71  Pulse: 85 65  Resp: 15 18  Temp: 36.9 C 36.7 C  SpO2: 94% 98%    Last Pain:  Vitals:   12/02/16 1316  TempSrc: Oral  PainSc: 5                  Susanne Baumgarner A.

## 2016-12-02 NOTE — Anesthesia Procedure Notes (Signed)
Procedure Name: Intubation Date/Time: 12/02/2016 10:07 AM Performed by: Delphia Grates Pre-anesthesia Checklist: Patient identified, Emergency Drugs available, Suction available and Patient being monitored Patient Re-evaluated:Patient Re-evaluated prior to induction Oxygen Delivery Method: Circle system utilized Preoxygenation: Pre-oxygenation with 100% oxygen Induction Type: IV induction, Rapid sequence and Cricoid Pressure applied Laryngoscope Size: 3 and Miller Grade View: Grade I Tube type: Oral Tube size: 7.5 mm Number of attempts: 1 Airway Equipment and Method: Stylet and Oral airway Placement Confirmation: ETT inserted through vocal cords under direct vision,  positive ETCO2 and breath sounds checked- equal and bilateral Secured at: 22 cm Tube secured with: Tape Dental Injury: Teeth and Oropharynx as per pre-operative assessment

## 2016-12-02 NOTE — Discharge Instructions (Signed)
General Anesthesia, Adult, Care After These instructions provide you with information about caring for yourself after your procedure. Your health care provider may also give you more specific instructions. Your treatment has been planned according to current medical practices, but problems sometimes occur. Call your health care provider if you have any problems or questions after your procedure. What can I expect after the procedure? After the procedure, it is common to have:  Vomiting.  A sore throat.  Mental slowness.  It is common to feel:  Nauseous.  Cold or shivery.  Sleepy.  Tired.  Sore or achy, even in parts of your body where you did not have surgery.  Follow these instructions at home: For at least 24 hours after the procedure:  Do not: ? Participate in activities where you could fall or become injured. ? Drive. ? Use heavy machinery. ? Drink alcohol. ? Take sleeping pills or medicines that cause drowsiness. ? Make important decisions or sign legal documents. ? Take care of children on your own.  Rest. Eating and drinking  If you vomit, drink water, juice, or soup when you can drink without vomiting.  Drink enough fluid to keep your urine clear or pale yellow.  Make sure you have little or no nausea before eating solid foods.  Follow the diet recommended by your health care provider. General instructions  Have a responsible adult stay with you until you are awake and alert.  Return to your normal activities as told by your health care provider. Ask your health care provider what activities are safe for you.  Take over-the-counter and prescription medicines only as told by your health care provider.  If you smoke, do not smoke without supervision.  Keep all follow-up visits as told by your health care provider. This is important. Contact a health care provider if:  You continue to have nausea or vomiting at home, and medicines are not helpful.  You  cannot drink fluids or start eating again.  You cannot urinate after 8-12 hours.  You develop a skin rash.  You have fever.  You have increasing redness at the site of your procedure. Get help right away if:  You have difficulty breathing.  You have chest pain.  You have unexpected bleeding.  You feel that you are having a life-threatening or urgent problem. This information is not intended to replace advice given to you by your health care provider. Make sure you discuss any questions you have with your health care provider. Document Released: 07/05/2000 Document Revised: 09/01/2015 Document Reviewed: 03/13/2015 Elsevier Interactive Patient Education  2018 [employer]. [facility] - DISCHARGE INSTRUCTIONS TO PATIENT  Activity:  Lifting - No more than 15 pounds for 10 days, then no limit  Wound Care:   May shower in 2 days.          No public water like pool, lake, or ocean  Diet:  As tolerated.  Follow up appointment:  Call Dr. Allene Pyo office [facility]) at [phone] for an appointment in 2 to 3 weeks.  Medications and dosages:  Resume your home medications.  You have a prescription for:  Vicodin  Call Dr. Ezzard Standing or his office  [phone]) if you have:  Temperature greater than 100.4,  Persistent nausea and vomiting,  Severe uncontrolled pain,  Redness, tenderness, or signs of infection (pain, swelling, redness, odor or green/yellow discharge around the site),  Difficulty breathing, headache or visual disturbances,  Any other questions or concerns you may have  after discharge.  In an emergency, call 911 or go to an Emergency Department at a nearby hospital.

## 2016-12-02 NOTE — Anesthesia Preprocedure Evaluation (Signed)
Anesthesia Evaluation  Patient identified by MRN, date of birth, ID band Patient awake    Reviewed: Allergy & Precautions, NPO status , Patient's Chart, lab work & pertinent test results  History of Anesthesia Complications (+) PONV  Airway Mallampati: III  TM Distance: >3 FB Neck ROM: Full    Dental no notable dental hx. (+) Teeth Intact, Chipped,    Pulmonary Current Smoker,    Pulmonary exam normal breath sounds clear to auscultation       Cardiovascular negative cardio ROS Normal cardiovascular exam Rhythm:Regular Rate:Normal     Neuro/Psych  Headaches, PSYCHIATRIC DISORDERS Anxiety Depression    GI/Hepatic Neg liver ROS, GERD  ,Cholelithiasis   Endo/Other  diabetes, GestationalMorbid obesity  Renal/GU negative Renal ROS  negative genitourinary   Musculoskeletal negative musculoskeletal ROS (+)   Abdominal (+) + obese,   Peds  Hematology  (+) anemia ,   Anesthesia Other Findings   Reproductive/Obstetrics negative OB ROS                             Anesthesia Physical Anesthesia Plan  ASA: III  Anesthesia Plan: General   Post-op Pain Management:    Induction: Intravenous, Rapid sequence and Cricoid pressure planned  PONV Risk Score and Plan: 4 or greater and Ondansetron, Dexamethasone, Midazolam, Scopolamine patch - Pre-op and Propofol infusion  Airway Management Planned: Oral ETT  Additional Equipment:   Intra-op Plan:   Post-operative Plan: Extubation in OR  Informed Consent: I have reviewed the patients History and Physical, chart, labs and discussed the procedure including the risks, benefits and alternatives for the proposed anesthesia with the patient or authorized representative who has indicated his/her understanding and acceptance.   Dental advisory given  Plan Discussed with: CRNA, Anesthesiologist and Surgeon  Anesthesia Plan Comments:          Anesthesia Quick Evaluation

## 2016-12-02 NOTE — Interval H&P Note (Signed)
History and Physical Interval Note:  12/02/2016 9:20 AM  Christine Trujillo  has presented today for surgery, with the diagnosis of symptomatic cholelithiasis  The various methods of treatment have been discussed with the patient and family.  Her fiancee is here with her.  After consideration of risks, benefits and other options for treatment, the patient has consented to  Procedure(s): LAPAROSCOPIC CHOLECYSTECTOMY WITH INTRAOPERATIVE CHOLANGIOGRAM (N/A) as a surgical intervention .  The patient's history has been reviewed, patient examined, no change in status, stable for surgery.  I have reviewed the patient's chart and labs.  Questions were answered to the patient's satisfaction.     Kalicia Dufresne H

## 2016-12-02 NOTE — Op Note (Signed)
12/02/2016  11:34 AM  PATIENT:  Christine Trujillo, 21 y.o., female, MRN: [id number]  PREOP DIAGNOSIS:  symptomatic cholelithiasis  POSTOP DIAGNOSIS:   Chronic cholecystitis, cholelithiasis  PROCEDURE:   Procedure(s): LAPAROSCOPIC CHOLECYSTECTOMY WITH INTRAOPERATIVE CHOLANGIOGRAM  SURGEON:   Ovidio Kin, M.D.  ASSISTANT:   C. Cliffton Asters, M.D.  ANESTHESIA:   general  Anesthesiologist: Mal Amabile, MD CRNA: Elyn Peers, CRNA; Delphia Grates, CRNA  General  ASA: 2  EBL:  minimal  ml  BLOOD ADMINISTERED: none  DRAINS: none   LOCAL MEDICATIONS USED:   30 cc 1/4% marcaine.  SPECIMEN:   Gall bladder  COUNTS CORRECT:  YES  INDICATIONS FOR PROCEDURE:  Christine Trujillo is a 21 y.o. (DOB: 11-24-1995) AA female whose primary care physician is Carmelia Roller, [name], DO and comes for cholecystectomy.   The indications and risks of the gall bladder surgery were explained to the patient.  The risks include, but are not limited to, infection, bleeding, common bile duct injury and open surgery.  SURGERY:  The patient was taken to OR room #[facility] at [facility].  The abdomen was prepped with chloroprep.  The patient was given 2 gm Ancef at the beginning of the operation.   A time out was held and the surgical checklist run.   An infraumbilical incision was made into the abdominal cavity.  A 12 mm Hasson trocar was inserted into the abdominal cavity through the infraumbilical incision and secured with a 0 Vicryl suture.  Three additional trocars were inserted: a 10 mm trocar in the sub-xiphoid location, a 5 mm trocar in the right mid subcostal area, and a 5 mm trocar in the right lateral subcostal area.   The abdomen was explored and the liver, stomach, and bowel that could be seen were unremarkable.   The gall bladder had some adhesions to it.  The duodenum was stuck to the gall bladder.  But the gall bladder was not badly inflamed.   I grasped the gall bladder and rotated it  cephalad.  Disssection was carried down to the gall bladder/cystic duct junction and the cystic duct isolated.  A clip was placed on the gall bladder side of the cystic duct.   An intra-operative cholangiogram was shot.   The intra-operative cholangiogram was shot using a cut off Taut catheter placed through a 14 gauge angiocath in the RUQ.  The Taut catheter was inserted in the cut cystic duct and secured with an endoclip.  A cholangiogram was shot with 10 cc of 1/2 strength Isoview.  Using fluoroscopy, the cholangiogram showed the flow of contrast into the common bile duct, up the hepatic radicals, and into the duodenum.  There was no mass or obstruction.  This was a normal intra-operative cholangiogram.   The Taut catheter was removed.  The cystic duct was tripley endoclipped and the cystic artery was identified and clipped.  The gall bladder was bluntly and sharpley dissected from the gall bladder bed.   After the gall bladder was removed from the liver, the gall bladder bed and Triangle of [place] were inspected.  There was no bleeding or bile leak.  The gall bladder was placed in a endocatch bag and delivered through the umbilicus.  The abdomen was irrigated with 400 cc saline.   The trocars were then removed.  I infiltrated 30cc of 1/4% Marcaine into the incisions.  The umbilical port closed with a 0 Vicryl suture and the skin closed with 4-0 Monocryl.  The skin was painted with DermaBond.  The patient's sponge and needle count were correct.  The patient was transported to the RR in good condition.  Ovidio Kin, MD, [facility] Pager: [phone] Office phone:  [phone]

## 2016-12-03 ENCOUNTER — Encounter (HOSPITAL_COMMUNITY): Payer: Self-pay | Admitting: Surgery

## 2016-12-15 ENCOUNTER — Ambulatory Visit (INDEPENDENT_AMBULATORY_CARE_PROVIDER_SITE_OTHER): Payer: Medicaid Other | Admitting: Behavioral Health

## 2016-12-15 DIAGNOSIS — Z30013 Encounter for initial prescription of injectable contraceptive: Secondary | ICD-10-CM

## 2016-12-15 DIAGNOSIS — Z308 Encounter for other contraceptive management: Secondary | ICD-10-CM

## 2016-12-15 MED ORDER — MEDROXYPROGESTERONE ACETATE 150 MG/ML IM SUSP
150.0000 mg | Freq: Once | INTRAMUSCULAR | Status: AC
  Administered 2016-12-15: 150 mg via INTRAMUSCULAR

## 2016-12-15 NOTE — Progress Notes (Signed)
Pre visit review using our clinic review tool, if applicable. No additional management support is needed unless otherwise documented below in the visit note.  Patient came in clinic today for depo-provera injection. IM injection was given in the right deltoid. Patient tolerated injection well. Next appointment 03/02/17 at 10:00 AM.

## 2017-01-18 ENCOUNTER — Encounter: Payer: Self-pay | Admitting: Family Medicine

## 2017-01-24 ENCOUNTER — Other Ambulatory Visit: Payer: Self-pay | Admitting: Family Medicine

## 2017-01-26 ENCOUNTER — Encounter: Payer: Self-pay | Admitting: Family Medicine

## 2017-01-26 ENCOUNTER — Ambulatory Visit (INDEPENDENT_AMBULATORY_CARE_PROVIDER_SITE_OTHER): Payer: Medicaid Other | Admitting: Family Medicine

## 2017-01-26 VITALS — BP 110/78 | HR 81 | Temp 98.5°F | Ht 63.0 in | Wt 268.2 lb

## 2017-01-26 DIAGNOSIS — K0889 Other specified disorders of teeth and supporting structures: Secondary | ICD-10-CM

## 2017-01-26 MED ORDER — TRAMADOL HCL 50 MG PO TABS: 50.0000 mg | ORAL_TABLET | Freq: Three times a day (TID) | ORAL | 0 refills | Status: DC | PRN

## 2017-01-26 NOTE — Patient Instructions (Signed)
If you do not hear anything about your referral in the next 1-2 weeks, call our office and ask for an update.  Use medicine for breakthrough pain.

## 2017-01-26 NOTE — Progress Notes (Signed)
Chief Complaint  Patient presents with  . Dental Pain    Subjective: Patient is a 21 y.o. female here for dental pain.  Longstanding issue, has been getting worse for the past 2 days. No inciting injury or change in chewing. R lower molar. She has been using Advil and Tylenol with little relief. She has an appt with the dental school in January, asking for a referral for R lower jaw dentalgia. No drainage, strange odors/tastes, swelling or fevers.    ROS: Heent: +dental pain Const: no fevers  Family History  Problem Relation Age of Onset  . Diabetes Mother   . Heart disease Mother        CHF   Past Medical History:  Diagnosis Date  . Anemia    during pregnancy  . Anxiety   . Depression   . Gallstone   . GERD (gastroesophageal reflux disease)   . Gestational diabetes    glyburide  . Headache   . Migraine   . Obesity   . Paratubal cyst 02/19/2016   No Known Allergies  Current Outpatient Prescriptions:  .  dicyclomine (BENTYL) 20 MG tablet, Take 1 tablet (20 mg total) by mouth 3 (three) times daily before meals. (Patient taking differently: Take 20 mg by mouth every 4 (four) hours as needed for spasms. ), Disp: 30 tablet, Rfl: 1 .  diphenhydramine-acetaminophen (TYLENOL PM) 25-500 MG TABS tablet, Take 2 tablets by mouth at bedtime as needed (sleep)., Disp: , Rfl:  .  HYDROcodone-acetaminophen (NORCO/VICODIN) 5-325 MG tablet, Take 1-2 tablets by mouth every 6 (six) hours as needed for moderate pain., Disp: 20 tablet, Rfl: 0 .  medroxyPROGESTERone (DEPO-PROVERA) 150 MG/ML injection, Inject 1 mL (150 mg total) into the muscle every 3 (three) months., Disp: 1 mL, Rfl: 3 .  ondansetron (ZOFRAN) 4 MG tablet, Take 4 mg by mouth every 8 (eight) hours as needed for nausea or vomiting., Disp: , Rfl:  .  traMADol (ULTRAM) 50 MG tablet, Take 1 tablet (50 mg total) by mouth every 8 (eight) hours as needed., Disp: 15 tablet, Rfl: 0  Objective: BP 110/78 (BP Location: Left Arm, Patient  Position: Sitting, Cuff Size: Large)   Pulse 81   Temp 98.5 F (36.9 C) (Oral)   Ht 5\' 3"  (1.6 m)   Wt 268 lb 4 oz (121.7 kg)   SpO2 97%   BMI 47.52 kg/m  General: Awake, appears stated age HEENT: MMM, dental decay over R mand molar, no edema or fluctuance of gingiva Lungs: No accessory muscle use Psych: Age appropriate judgment and insight, normal affect and mood  Procedure note: dental block, mandibular Verbal consent obtained Area prepped in usual fashion 1 cc 2% lido w/o epi injected lateral to mandible over nerve with 27 g needle Pt tolerated procedure well with no immediate complications noted She reported immediate improvement  Assessment and Plan: Dentalgia - Plan: Ambulatory referral to Dentistry, traMADol (ULTRAM) 50 MG tablet  Orders as above. Cont Motrin and Tylenol. Tramadol for breakthrough pain. Refer to dentist for further evaluation. Dental block of inf alveolar nerve of trigeminal nerve 3 for immediate pain relief.  F/u prn.  The patient voiced understanding and agreement to the plan.  Jilda Rocheicholas Paul KironWendling, DO 01/26/17  4:25 PM

## 2017-03-02 ENCOUNTER — Ambulatory Visit (INDEPENDENT_AMBULATORY_CARE_PROVIDER_SITE_OTHER): Payer: Medicaid Other

## 2017-03-02 DIAGNOSIS — Z3042 Encounter for surveillance of injectable contraceptive: Secondary | ICD-10-CM | POA: Diagnosis not present

## 2017-03-02 MED ORDER — MEDROXYPROGESTERONE ACETATE 150 MG/ML IM SUSP
150.0000 mg | Freq: Once | INTRAMUSCULAR | Status: AC
Start: 2017-03-02 — End: 2017-03-02
  Administered 2017-03-02: 150 mg via INTRAMUSCULAR

## 2017-03-02 NOTE — Progress Notes (Signed)
re visit review using our clinic tool,if applicable. No additional management support is needed unless otherwise documented below in the visit note.   Patient in today for Depo Provera injection per order from N. Wendling, D.O.  No complaints voiced this visit. Given 150 mg IM left deltoid per patient request. Patient tolerated well  Patient states she will call back to schedule nect injection.

## 2017-03-23 ENCOUNTER — Encounter: Payer: Self-pay | Admitting: Family Medicine

## 2017-03-23 ENCOUNTER — Telehealth: Payer: Self-pay | Admitting: Family Medicine

## 2017-03-23 NOTE — Telephone Encounter (Signed)
Copied from CRM [id number]. Topic: Inquiry >> Mar 23, 2017  3:03 PM Stephannie LiSimmons, Lyon Dumont L, NT wrote: Reason for CRM Patients fiance called and was following up concerning adenosylcobalamin / formyltetrahydrafolic 1.5 mg iron ?  [facility] ,she was given this while inpatient [phone] they are saying she was only able to get 1 filled , please call to verify correct medicine was told this medicine would come from her primary, and she is having bad jerks? From not getting needed medication, will upload picture to my chart ,please call Verline LemaJoseph Stine at [phone]

## 2017-03-23 NOTE — Telephone Encounter (Signed)
Copied from CRM [id number]. Topic: Inquiry >> Mar 23, 2017  3:03 PM Stephannie LiSimmons, Trueman Worlds L, NT wrote: Reason for CRM: Patients fiance called and was following up concerning adenosylcobalamin / formyltetrahydrafolic 1.5 mg iron ?  [facility] this while inpatient  [phone]  they are saying she was only able to get 1 filled , please call to verify correct medicine was told this medicine would come from her primary, and she is having bad jerks? From not getting needed medication, will upload picture to my chart ,please call Verline LemaJoseph Stine at [phone]

## 2017-03-23 NOTE — Telephone Encounter (Signed)
MyChart message was received by Pt and has been forwarded to PCP.

## 2017-03-23 NOTE — Telephone Encounter (Signed)
Copied from CRM [phone]. Topic: General - Other >> Mar 23, 2017  3:03 PM Stephannie LiSimmons, Laurann Mcmorris L, NT wrote: Reason for CRM: Patients fiance called and was following up concerning adenosylcobalamin / formyltetrahydrafolic 1.5 mg iron ?  [facility] }they are saying she was only able to get 1 filled , please call to verify correct medicine was told this medicine would come from her primary, and she is having bad jerks? From not getting needed medication, will upload picture to my chart ,please call Verline LemaJoseph Stine at [phone]

## 2017-03-24 ENCOUNTER — Encounter: Payer: Self-pay | Admitting: Family Medicine

## 2017-03-24 ENCOUNTER — Ambulatory Visit (INDEPENDENT_AMBULATORY_CARE_PROVIDER_SITE_OTHER): Payer: Medicaid Other | Admitting: Family Medicine

## 2017-03-24 VITALS — BP 128/72 | HR 78 | Temp 98.3°F | Ht 63.0 in | Wt 268.0 lb

## 2017-03-24 DIAGNOSIS — F418 Other specified anxiety disorders: Secondary | ICD-10-CM | POA: Diagnosis not present

## 2017-03-24 MED ORDER — ENLYTE PO CAPS: 1.0000 | ORAL_CAPSULE | Freq: Every day | ORAL | 2 refills | Status: DC

## 2017-03-24 NOTE — Progress Notes (Signed)
Chief Complaint  Patient presents with  . Depression  . Anxiety    Subjective Christine Trujillo is an 21 y.o. female who presents with anxiety/depression The [facility] for thoughts of harming herself.  She was started on Prozac and EnLyte.  She was doing very well on these medicines until she was discharged.  Her insurance would not cover for the latter medication.  Her anxiety and depressive symptoms are starting to return.  She denies current thoughts of harming herself or others.  She is not self medicating.  She is no longer smoking marijuana.  She does not follow with a counselor or psychologist.  Social stressors include raising a child.  She does not get as much help from the father as she would like.   Past Medical History:  Diagnosis Date  . Anemia    during pregnancy  . Anxiety   . Depression   . Gallstone   . GERD (gastroesophageal reflux disease)   . Gestational diabetes    glyburide  . Headache   . Migraine   . Obesity   . Paratubal cyst 02/19/2016    Medications Current Outpatient Medications on File Prior to Visit  Medication Sig Dispense Refill  . dicyclomine (BENTYL) 20 MG tablet Take 1 tablet (20 mg total) by mouth 3 (three) times daily before meals. (Patient taking differently: Take 20 mg by mouth every 4 (four) hours as needed for spasms. ) 30 tablet 1  . diphenhydramine-acetaminophen (TYLENOL PM) 25-500 MG TABS tablet Take 2 tablets by mouth at bedtime as needed (sleep).    Marland Kitchen. HYDROcodone-acetaminophen (NORCO/VICODIN) 5-325 MG tablet Take 1-2 tablets by mouth every 6 (six) hours as needed for moderate pain. 20 tablet 0  . medroxyPROGESTERone (DEPO-PROVERA) 150 MG/ML injection Inject 1 mL (150 mg total) into the muscle every 3 (three) months. 1 mL 3  . ondansetron (ZOFRAN) 4 MG tablet Take 4 mg by mouth every 8 (eight) hours as needed for nausea or vomiting.    . traMADol (ULTRAM) 50 MG tablet Take 1 tablet  (50 mg total) by mouth every 8 (eight) hours as needed. 15 tablet 0   Allergies No Known Allergies  Family History Family History  Problem Relation Age of Onset  . Diabetes Mother   . Heart disease Mother        CHF     Review Of Systems Cardiovascular:  no chest pain, no palpitations Psychiatric: as noted in HPI  Exam BP 128/72 (BP Location: Left Arm, Patient Position: Sitting, Cuff Size: Large)   Pulse 78   Temp 98.3 F (36.8 C) (Oral)   Ht 5\' 3"  (1.6 m)   Wt 268 lb (121.6 kg)   SpO2 98%   BMI 47.47 kg/m  General:  well developed, well nourished, in no apparent distress Lungs: normal respiratory effort without accessory muscle use Psych: well oriented with normal range of affect and age-appropriate judgement/insight  Assessment and Plan  Anxiety with depression - Plan: Dietary Management Product (ENLYTE) CAPS, FLUoxetine (PROZAC) 20 MG tablet  Orders as above. Counseled on adjunctive treatment with exercise/physical activity. Number for [facility] provided in AVS. F/u in 1 mo.  Patient voiced understanding and agreement to the plan.  Jilda Rocheicholas Paul [place] Coast[place], DO 03/24/17 11:17 AM

## 2017-03-24 NOTE — Patient Instructions (Addendum)
Coping skills Choose 5 that work for you:  Take a deep breath  Count to 20  Read a book  Do a puzzle  Meditate  Bake  Sing  Knit  Garden  Pray  Go outside  Call a friend  Listen to music  Take a walk  Color  Send a note  Take a bath  Watch a movie  Be alone in a quiet place  Pet an animal  Visit a friend  Journal  Exercise  Stretch   Please consider counseling. Contact [phone] to schedule an appointment or inquire about cost/insurance coverage.  Take a prenatal vitamin.

## 2017-03-24 NOTE — Progress Notes (Signed)
Pre visit review using our clinic review tool, if applicable. No additional management support is needed unless otherwise documented below in the visit note. 

## 2017-03-25 ENCOUNTER — Telehealth: Payer: Self-pay | Admitting: Family Medicine

## 2017-03-25 NOTE — Telephone Encounter (Signed)
Take Vitamin B complex, Folic acid and Zinc. These essentially make up the [facility] Care[facility]lyte. TY.

## 2017-03-25 NOTE — Telephone Encounter (Signed)
Patient informed of PCP instructions. She agreed to do. 

## 2017-03-25 NOTE — Telephone Encounter (Signed)
Contacted pt's fiance', Christine Trujillo, regarding request for pre-authorization for medication; he states that medicaid will not pay for this medication; he would like to know if Dr [name] could find a substitute; please call them back at [phone]; see CRM [phone]

## 2017-03-25 NOTE — Telephone Encounter (Signed)
Spoke with pharmacy, Enlyte not covered by insurance. [facility] at [facility], [phone] and verified that Christine Desanctisnlyte is considered an otc medication and pt will have to pay out of pocket if no other alternatives. Case  ID# [id number] Insurance is not able to tell us potential alternatives.  Please advise?

## 2017-03-31 ENCOUNTER — Encounter: Payer: Self-pay | Admitting: Family Medicine

## 2017-03-31 ENCOUNTER — Telehealth: Payer: Self-pay | Admitting: Family Medicine

## 2017-03-31 NOTE — Telephone Encounter (Signed)
Pt brought daughter in. Requesting letter stating she is medical sound to donate plasma, center is concerned due to recent gall bladder removal. Letter written.

## 2017-04-01 ENCOUNTER — Encounter: Payer: Self-pay | Admitting: Family Medicine

## 2017-04-01 NOTE — Telephone Encounter (Signed)
Checking status, please advise [phone]

## 2017-04-01 NOTE — Telephone Encounter (Addendum)
Relation to GE:XBMWpt:self Call back number:[phone]  Reason for call:  Patient states plasma center requesting letter to reflect "gallbladder removal was 11/10/16" and would like letter fax to # [phone], please advise

## 2017-04-01 NOTE — Telephone Encounter (Signed)
Letter completed/faxed as stated

## 2017-04-01 NOTE — Telephone Encounter (Signed)
OK to do. TY.  

## 2017-04-15 ENCOUNTER — Ambulatory Visit: Payer: Self-pay | Admitting: Family Medicine

## 2017-04-21 ENCOUNTER — Ambulatory Visit: Payer: Self-pay | Admitting: Family Medicine

## 2017-04-21 DIAGNOSIS — Z0289 Encounter for other administrative examinations: Secondary | ICD-10-CM

## 2017-05-09 ENCOUNTER — Ambulatory Visit: Payer: Self-pay | Admitting: Family Medicine

## 2017-05-24 ENCOUNTER — Other Ambulatory Visit: Payer: Self-pay | Admitting: Family Medicine

## 2017-06-01 ENCOUNTER — Ambulatory Visit (INDEPENDENT_AMBULATORY_CARE_PROVIDER_SITE_OTHER): Payer: Medicaid Other | Admitting: *Deleted

## 2017-06-01 DIAGNOSIS — Z309 Encounter for contraceptive management, unspecified: Secondary | ICD-10-CM | POA: Diagnosis not present

## 2017-06-01 MED ORDER — MEDROXYPROGESTERONE ACETATE 150 MG/ML IM SUSP
150.0000 mg | Freq: Once | INTRAMUSCULAR | Status: AC
  Administered 2017-06-01: 150 mg via INTRAMUSCULAR

## 2017-06-01 NOTE — Progress Notes (Signed)
Pre visit review using our clinic review tool, if applicable. No additional management support is needed unless otherwise documented below in the visit note.  Pt here for Depo Provera injection.  Last injection given 03/02/17 and is within scheduled dosing window.  Injection given 150mg  IM, right deltoid and pt tolerated injection well.   Next injection scheduled for 08/30/17 at 2:45pm.

## 2017-06-02 ENCOUNTER — Ambulatory Visit: Payer: Self-pay

## 2017-06-29 ENCOUNTER — Other Ambulatory Visit: Payer: Self-pay | Admitting: Family Medicine

## 2017-06-30 MED ORDER — NAPROXEN 500 MG PO TABS: 500.0000 mg | ORAL_TABLET | Freq: Two times a day (BID) | ORAL | 1 refills | Status: DC | PRN

## 2017-08-08 ENCOUNTER — Encounter: Payer: Self-pay | Admitting: Family Medicine

## 2017-08-11 ENCOUNTER — Telehealth: Payer: Self-pay | Admitting: Family Medicine

## 2017-08-11 NOTE — Telephone Encounter (Signed)
Late entry, I did rx a short supply of Hydrocodone for pt when she came to pt with father of her child's appt. Dental pain is dx while she gets in with provider.

## 2017-08-12 ENCOUNTER — Ambulatory Visit: Payer: Medicaid Other | Admitting: Family Medicine

## 2017-08-17 ENCOUNTER — Ambulatory Visit: Payer: Medicaid Other | Admitting: Family Medicine

## 2017-08-30 ENCOUNTER — Ambulatory Visit: Payer: Medicaid Other

## 2017-08-31 ENCOUNTER — Ambulatory Visit: Payer: Medicaid Other

## 2017-08-31 ENCOUNTER — Telehealth: Payer: Self-pay

## 2017-08-31 NOTE — Telephone Encounter (Signed)
Patient came in for Depo Provera injection today. States she has had problems with back and leg pain and bleeding from injection site when she has had injection in the past. Held injection today and patient scheduled appointment with provider to review complaints.

## 2017-09-01 ENCOUNTER — Encounter: Payer: Self-pay | Admitting: Family Medicine

## 2017-09-01 ENCOUNTER — Ambulatory Visit: Payer: Medicaid Other | Admitting: Family Medicine

## 2017-09-01 VITALS — BP 126/70 | HR 74 | Temp 98.1°F | Resp 16 | Ht 63.0 in | Wt 258.0 lb

## 2017-09-01 DIAGNOSIS — M545 Low back pain, unspecified: Secondary | ICD-10-CM

## 2017-09-01 DIAGNOSIS — Z309 Encounter for contraceptive management, unspecified: Secondary | ICD-10-CM

## 2017-09-01 DIAGNOSIS — N920 Excessive and frequent menstruation with regular cycle: Secondary | ICD-10-CM | POA: Diagnosis not present

## 2017-09-01 DIAGNOSIS — Z3042 Encounter for surveillance of injectable contraceptive: Secondary | ICD-10-CM

## 2017-09-01 DIAGNOSIS — F17200 Nicotine dependence, unspecified, uncomplicated: Secondary | ICD-10-CM

## 2017-09-01 DIAGNOSIS — R519 Headache, unspecified: Secondary | ICD-10-CM

## 2017-09-01 DIAGNOSIS — R51 Headache: Secondary | ICD-10-CM

## 2017-09-01 LAB — POCT URINE PREGNANCY: Preg Test, Ur: NEGATIVE

## 2017-09-01 MED ORDER — MEDROXYPROGESTERONE ACETATE 150 MG/ML IM SUSP
150.0000 mg | Freq: Once | INTRAMUSCULAR | Status: AC
  Administered 2017-09-01: 150 mg via INTRAMUSCULAR

## 2017-09-01 MED ORDER — BUTALBITAL-APAP-CAFFEINE 50-325-40 MG PO TABS: 1.0000 | ORAL_TABLET | Freq: Four times a day (QID) | ORAL | 0 refills | Status: DC | PRN

## 2017-09-01 MED ORDER — CYCLOBENZAPRINE HCL 10 MG PO TABS: 10.0000 mg | ORAL_TABLET | Freq: Every day | ORAL | 0 refills | Status: DC

## 2017-09-01 NOTE — Progress Notes (Signed)
[facility] [address], [address] [place], [place] [address] [phone]  Date:  09/01/2017   Name:  Christine Trujillo   DOB:  Dec 01, 1995   MRN:  [id number]  PCP:  Christine Dory, DO    Chief Complaint: Back Pain (started when taking depo, migraine, not eating, spotting, started 10/13/16) and Leg Pain   History of Present Illness:  Christine Trujillo is a 22 y.o. very pleasant female patient who presents with the following:  Pt of Dr. Carmelia Trujillo with history of depression, obesity  She has been receiving depo provera since July of 2018, after the birth of her daughter She thinks that this is why she is having back pain- not clear why she has made this connection She notes that her back will "lock up right there in the middle" every night since January If she lifts anything she has more pain She may have pain going down her right leg  She notes weakness in her leg "to the point that I am about to fall."  She also noted menstrual spotting for about one month after her last dose of depo; has not happened with any previous doses, and has since resolved. Also she has noted what she describes as migraine HA for about 2 weeks She has tried some tylenol, advil but "it does not help."   Her partner Christine Trujillo who is here with her today notes that she is not eating much   Wt Readings from Last 3 Encounters:  09/01/17 258 lb (117 kg)  03/24/17 268 lb (121.6 kg)  01/26/17 268 lb 4 oz (121.7 kg)   She endorses aura with her migraine when questioned but cannot really describe her aura.   She states that she had migraine with aura when she was younger as well, but is not sure if this was also associated with depo provera use at that time Pt states her current HA is not worst HA of life, but is more persistent than most HA she might have  Long conversation about contraceptive options.  As she is having migraine with aura would avoid  estrogens.  Progesterone only contraceptives are also not ideal for her to continue to use, but she feels that she will be in danger of undesired pregnancy if not on contraception and does wish to continue depo at this time.  A copper IUD might be a good option for her- she is seeing her OBG next month for her pap and can discuss with him   Last depo shot 2/20- she is one day past her window which was from 5/8- 5/22 Negative HCG today Last intercourse about a week ago.  Counseled that unsuspected pregnancy is unlikely but not impossible, but hormones would not harm fetus.  She would like to go ahead with shot today  Admits that she does smoke MJ Patient Active Problem List   Diagnosis Date Noted  . Low back pain 07/21/2016  . BMI 45.0-49.9, adult (HCC) 04/22/2016  . Depression 04/22/2016  . Right ovarian cyst affecting pregnancy in second trimester, antepartum 03/25/2016    Past Medical History:  Diagnosis Date  . Anemia    during pregnancy  . Anxiety   . Depression   . Gallstone   . GERD (gastroesophageal reflux disease)   . Gestational diabetes    glyburide  . Headache   . Migraine   . Obesity   . Paratubal cyst 02/19/2016    Past  Surgical History:  Procedure Laterality Date  . CHOLECYSTECTOMY N/A 12/02/2016   Procedure: LAPAROSCOPIC CHOLECYSTECTOMY WITH INTRAOPERATIVE CHOLANGIOGRAM;  Surgeon: Christine Kin, MD;  Location: [facility];  Service: General;  Laterality: N/A;  . WISDOM TOOTH EXTRACTION      Social History   Tobacco Use  . Smoking status: Current Every Day Smoker    Types: Cigars  . Smokeless tobacco: Never Used  . Tobacco comment: 3  black n milds a day  Substance Use Topics  . Alcohol use: No  . Drug use: No    Comment: last use Nov 2017    Family History  Problem Relation Age of Onset  . Diabetes Mother   . Heart disease Mother        CHF    No Known Allergies  Medication list has been reviewed and updated.  Current Outpatient Medications on  File Prior to Visit  Medication Sig Dispense Refill  . Dietary Management Product (ENLYTE) CAPS Take 1 tablet by mouth daily. 30 capsule 2  . diphenhydramine-acetaminophen (TYLENOL PM) 25-500 MG TABS tablet Take 2 tablets by mouth at bedtime as needed (sleep).    Marland Kitchen FLUoxetine (PROZAC) 20 MG tablet Take 1 tablet (20 mg total) by mouth daily. 30 tablet 3  . medroxyPROGESTERone (DEPO-PROVERA) 150 MG/ML injection Inject 1 mL (150 mg total) into the muscle every 3 (three) months. 1 mL 3  . naproxen (NAPROSYN) 500 MG tablet Take 1 tablet (500 mg total) by mouth 2 (two) times daily as needed. for pain 60 tablet 1   No current facility-administered medications on file prior to visit.     Review of Systems:  As per HPI- otherwise negative. No fever or chills   Physical Examination: Vitals:   09/01/17 1339  BP: 126/70  Pulse: 74  Resp: 16  Temp: 98.1 F (36.7 C)  SpO2: 98%   Vitals:   09/01/17 1339  Weight: 258 lb (117 kg)  Height:  (1.6 m)   Body mass index is 45.7 kg/m. Ideal Body Weight: Weight in (lb) to have BMI = 25: 140.8  GEN: WDWN, NAD, Non-toxic, A & O x 3, very obese for age, looks well otherwise HEENT: Atraumatic, Normocephalic. Neck supple. No masses, No LAD.  Bilateral TM wnl, oropharynx normal.  PEERL,EOMI.   Ears and Nose: No external deformity. CV: RRR, No M/G/R. No JVD. No thrill. No extra heart sounds. PULM: CTA B, no wheezes, crackles, rhonchi. No retractions. No resp. distress. No accessory muscle use. ABD: S, NT, ND EXTR: No c/c/e NEURO Normal gait.  PSYCH: Normally interactive. Conversant. Not depressed or anxious appearing.  Calm demeanor.  Normal UE and LE strength and DTR Normal balance She indicates her bilateral lower back as location of her back pain- muscles in this area are tight Negative SLR bilaterally   Results for orders placed or performed in visit on 09/01/17  POCT urine pregnancy  Result Value Ref Range   Preg Test, Ur Negative  Negative    Assessment and Plan: Encounter for contraceptive management, unspecified type - Plan: POCT urine pregnancy, medroxyPROGESTERone (DEPO-PROVERA) injection 150 mg, CANCELED: Ambulatory referral to Obstetrics / Gynecology  Acute nonintractable headache, unspecified headache type - Plan: butalbital-acetaminophen-caffeine (FIORICET, ESGIC) 50-325-40 MG tablet  Acute bilateral low back pain without sciatica - Plan: cyclobenzaprine (FLEXERIL) 10 MG tablet  Menstrual spotting  Morbid obesity (HCC)  Smoker  Here with several concerns today One day past her depo window.  Negative HCG today As per discussion above,  pt would like to have shot today Counseled condoms for 2 weeks, take home HCG in 2 weeks She is seeing GYN in June- will send him a message about her headaches, ? paraguard  Headaches- migraine per pt.  Not worse HA of life, she has had these in the past. Will have her try fiorcet to break headache.  Cautioned regarding sedation.  If not helpful she will let me know  Menstrual spotting- may be related to depo use.  This is now resolved.  Reassured pt  Lower back pain. Counseled pt this is likely NOT due to depo.  Seems to have muscle spasm in her lower back.  Gave her flexeril to use at night- however she is to wait until she is done with fiorcet to use this Counseled her that obesity is likely contributing to her back pain, weight loss may help relive this long term  Counseled that smoking may increase her risk of stroke esp as she has migraine with aura  I spent over 40 min face to face with pt today, more than 50% in counseling    Signed Abbe Amsterdam, MD

## 2017-09-01 NOTE — Patient Instructions (Addendum)
Please stop smoking! This increases your risks of a heart attack or stroke especially as you have migraine headaches I will refer you to GYN to discuss a copper IUD- this may be the safest long term contraceptive option for you  For your headaches, try the fiorcet as needed but remember this can make you feel drowsy Do not use when you need to drive You can try the flexeril for your back pain, but don't take with fiorcet- one of the other please!

## 2017-10-03 ENCOUNTER — Ambulatory Visit: Payer: Self-pay | Admitting: Family Medicine

## 2017-12-06 ENCOUNTER — Ambulatory Visit (INDEPENDENT_AMBULATORY_CARE_PROVIDER_SITE_OTHER): Payer: Medicaid Other

## 2017-12-06 DIAGNOSIS — Z3042 Encounter for surveillance of injectable contraceptive: Secondary | ICD-10-CM

## 2017-12-06 DIAGNOSIS — Z309 Encounter for contraceptive management, unspecified: Secondary | ICD-10-CM

## 2017-12-06 MED ORDER — MEDROXYPROGESTERONE ACETATE 150 MG/ML IM SUSP
150.0000 mg | Freq: Once | INTRAMUSCULAR | Status: AC
  Administered 2017-12-06: 150 mg via INTRAMUSCULAR

## 2017-12-06 NOTE — Progress Notes (Addendum)
Pre visit review using our clinic tool,if applicable. No additional management support is needed unless otherwise documented below in the visit note.   Patient in for Depo Provera injection. Given 150 mg IM left deltoid. Patient tolerated well.   No complaints this visit. Next appointment scheduled for 02/21/18. Patient aware.  Nursing  note reviewed. Agree with documention and plan.

## 2018-02-20 ENCOUNTER — Ambulatory Visit: Payer: Medicaid Other

## 2018-02-21 ENCOUNTER — Ambulatory Visit: Payer: Medicaid Other

## 2018-02-22 ENCOUNTER — Ambulatory Visit (INDEPENDENT_AMBULATORY_CARE_PROVIDER_SITE_OTHER): Payer: Medicaid Other

## 2018-02-22 DIAGNOSIS — Z3042 Encounter for surveillance of injectable contraceptive: Secondary | ICD-10-CM

## 2018-02-22 DIAGNOSIS — Z309 Encounter for contraceptive management, unspecified: Secondary | ICD-10-CM

## 2018-02-22 MED ORDER — MEDROXYPROGESTERONE ACETATE 150 MG/ML IM SUSP
150.0000 mg | Freq: Once | INTRAMUSCULAR | Status: AC
  Administered 2018-02-22: 150 mg via INTRAMUSCULAR

## 2018-02-23 ENCOUNTER — Ambulatory Visit: Payer: Medicaid Other

## 2018-05-10 ENCOUNTER — Ambulatory Visit (INDEPENDENT_AMBULATORY_CARE_PROVIDER_SITE_OTHER): Payer: Medicaid Other | Admitting: Family Medicine

## 2018-05-10 ENCOUNTER — Encounter: Payer: Self-pay | Admitting: Family Medicine

## 2018-05-10 VITALS — BP 118/72 | HR 74 | Temp 98.7°F | Ht 63.0 in | Wt 268.2 lb

## 2018-05-10 DIAGNOSIS — F339 Major depressive disorder, recurrent, unspecified: Secondary | ICD-10-CM | POA: Diagnosis not present

## 2018-05-10 DIAGNOSIS — S025XXA Fracture of tooth (traumatic), initial encounter for closed fracture: Secondary | ICD-10-CM

## 2018-05-10 DIAGNOSIS — L309 Dermatitis, unspecified: Secondary | ICD-10-CM | POA: Diagnosis not present

## 2018-05-10 MED ORDER — VENLAFAXINE HCL ER 75 MG PO CP24: 75.0000 mg | ORAL_CAPSULE | Freq: Every day | ORAL | 3 refills | Status: DC

## 2018-05-10 MED ORDER — TRIAMCINOLONE ACETONIDE 0.1 % EX CREA: 1.0000 "application " | TOPICAL_CREAM | Freq: Two times a day (BID) | CUTANEOUS | 0 refills | Status: DC

## 2018-05-10 NOTE — Progress Notes (Signed)
Chief Complaint  Patient presents with  . Follow-up    Subjective Christine Trujillo presents for f/u anxiety/depression.  She is currently being treated with Prozac 20 mg/d.  Reports effectiveness since treatment. Made her tired. No thoughts of harming self. No self-medication with alcohol, prescription drugs- is using marijuana. Pt is not following with a counselor/psychologist.  3 weeks of a skin rash. No new lotions/soaps/topicals. Vaseline helps for a short period of time. +itchy. Painful after she itches, no drainage.   Broken tooth, previous dentist broke during extraction. Trying to find a new one.   ROS Psych: No homicidal or suicidal thoughts Skin: +rash  Past Medical History:  Diagnosis Date  . Anemia    during pregnancy  . Anxiety   . Depression   . Gallstone   . GERD (gastroesophageal reflux disease)   . Gestational diabetes    glyburide  . Headache   . Migraine   . Obesity   . Paratubal cyst 02/19/2016   Allergies as of 05/10/2018   No Known Allergies     Medication List       Accurate as of May 10, 2018  4:12 PM. Always use your most recent med list.        cyclobenzaprine 10 MG tablet Commonly known as:  FLEXERIL Take 1 tablet (10 mg total) by mouth at bedtime.   ENLYTE Caps Take 1 tablet by mouth daily.   FLUoxetine 20 MG tablet Commonly known as:  PROZAC Take 1 tablet (20 mg total) by mouth daily.   medroxyPROGESTERone 150 MG/ML injection Commonly known as:  DEPO-PROVERA Inject 1 mL (150 mg total) into the muscle every 3 (three) months.   naproxen 500 MG tablet Commonly known as:  NAPROSYN Take 1 tablet (500 mg total) by mouth 2 (two) times daily as needed. for pain   triamcinolone cream 0.1 % Commonly known as:  KENALOG Apply 1 application topically 2 (two) times daily.   venlafaxine XR 75 MG 24 hr capsule Commonly known as:  EFFEXOR-XR Take 1 capsule (75 mg total) by mouth daily with breakfast.       Exam BP 118/72  (BP Location: Left Arm, Patient Position: Sitting, Cuff Size: Large)   Pulse 74   Temp 98.7 F (37.1 C) (Oral)   Ht 5\' 3"  (1.6 m)   Wt 268 lb 4 oz (121.7 kg)   SpO2 98%   BMI 47.52 kg/m  General:  well developed, well nourished, in no apparent distress Neck: neck supple without adenopathy, thyromegaly, or masses Mouth: MMM, broken R max molar Skin: Hyperpigmentation and scaling on L base of neck; no fluctuance, ttp excess warmth Lungs:  clear to auscultation, breath sounds equal bilaterally, no respiratory distress Cardio:  regular rate and rhythm without murmurs, heart sounds without clicks or rubs Psych: well oriented with normal range of affect and age-appropriate judgement/insight, alert and oriented x4.  Assessment and Plan  Depression, recurrent (HCC) - Plan: venlafaxine XR (EFFEXOR-XR) 75 MG 24 hr capsule  Closed fracture of tooth, initial encounter - Plan: Ambulatory referral to Dentistry  Dermatitis - Plan: triamcinolone cream (KENALOG) 0.1 %  1- change SSRI to SNRI 2- refer to dentist, resources given 3- topical steroid F/u in 6 weeks. The patient voiced understanding and agreement to the plan.  [name] Elbing, DO 05/10/18 4:12 PM

## 2018-05-10 NOTE — Progress Notes (Signed)
Pre visit review using our clinic review tool, if applicable. No additional management support is needed unless otherwise documented below in the visit note. 

## 2018-05-10 NOTE — Patient Instructions (Addendum)
Please consider counseling. Contact [phone] to schedule an appointment or inquire about cost/insurance coverage.  Continue using Vaseline. Avoid scented products.  Let us know if you need anything.

## 2018-05-11 ENCOUNTER — Telehealth: Payer: Self-pay | Admitting: Family Medicine

## 2018-05-11 NOTE — Telephone Encounter (Signed)
Patient's husband informed

## 2018-05-11 NOTE — Telephone Encounter (Signed)
The patient has migraine today and wanted to know if the new medication could be the cause.

## 2018-05-11 NOTE — Telephone Encounter (Signed)
Tough to say this early on. Probably not.

## 2018-05-25 ENCOUNTER — Ambulatory Visit (INDEPENDENT_AMBULATORY_CARE_PROVIDER_SITE_OTHER): Payer: Medicaid Other

## 2018-05-25 DIAGNOSIS — Z3042 Encounter for surveillance of injectable contraceptive: Secondary | ICD-10-CM

## 2018-05-25 MED ORDER — MEDROXYPROGESTERONE ACETATE 150 MG/ML IM SUSP
150.0000 mg | Freq: Once | INTRAMUSCULAR | Status: AC
  Administered 2018-05-25: 150 mg via INTRAMUSCULAR

## 2018-05-25 NOTE — Progress Notes (Signed)
Pre visit review using our clinic review tool, if applicable. No additional management support is needed unless otherwise documented below in the visit note. Patient is here today for Depo Provera injection. Patient is in her window. 150 mg IM given in left   Patient tolerated well.Next injection due May 1-15th. Next appointment scheduled for  08/25/2018

## 2018-06-02 ENCOUNTER — Emergency Department (HOSPITAL_BASED_OUTPATIENT_CLINIC_OR_DEPARTMENT_OTHER)
Admission: EM | Admit: 2018-06-02 | Discharge: 2018-06-02 | Disposition: A | Discharge status: Home / Self Care | Payer: Medicaid Other | Attending: Emergency Medicine | Admitting: Emergency Medicine

## 2018-06-02 ENCOUNTER — Other Ambulatory Visit: Payer: Self-pay

## 2018-06-02 ENCOUNTER — Encounter (HOSPITAL_BASED_OUTPATIENT_CLINIC_OR_DEPARTMENT_OTHER): Payer: Self-pay | Admitting: Emergency Medicine

## 2018-06-02 DIAGNOSIS — Z79899 Other long term (current) drug therapy: Secondary | ICD-10-CM | POA: Insufficient documentation

## 2018-06-02 DIAGNOSIS — F17210 Nicotine dependence, cigarettes, uncomplicated: Secondary | ICD-10-CM | POA: Diagnosis not present

## 2018-06-02 DIAGNOSIS — R197 Diarrhea, unspecified: Secondary | ICD-10-CM | POA: Diagnosis not present

## 2018-06-02 DIAGNOSIS — R112 Nausea with vomiting, unspecified: Secondary | ICD-10-CM | POA: Diagnosis present

## 2018-06-02 LAB — GASTROINTESTINAL PANEL BY PCR, STOOL (REPLACES STOOL CULTURE)
ADENOVIRUS F40/41: NOT DETECTED
Astrovirus: NOT DETECTED
CAMPYLOBACTER SPECIES: NOT DETECTED
CRYPTOSPORIDIUM: NOT DETECTED
CYCLOSPORA CAYETANENSIS: NOT DETECTED
ENTEROTOXIGENIC E COLI (ETEC): NOT DETECTED
Entamoeba histolytica: NOT DETECTED
Enteroaggregative E coli (EAEC): NOT DETECTED
Enteropathogenic E coli (EPEC): NOT DETECTED
Giardia lamblia: NOT DETECTED
Norovirus GI/GII: NOT DETECTED
PLESIMONAS SHIGELLOIDES: NOT DETECTED
Rotavirus A: NOT DETECTED
SALMONELLA SPECIES: NOT DETECTED
SAPOVIRUS (I, II, IV, AND V): NOT DETECTED
SHIGELLA/ENTEROINVASIVE E COLI (EIEC): NOT DETECTED
Shiga like toxin producing E coli (STEC): NOT DETECTED
VIBRIO SPECIES: NOT DETECTED
Vibrio cholerae: NOT DETECTED
YERSINIA ENTEROCOLITICA: NOT DETECTED

## 2018-06-02 LAB — COMPREHENSIVE METABOLIC PANEL
ALBUMIN: 3.6 g/dL (ref 3.5–5.0)
ALK PHOS: 56 U/L (ref 38–126)
ALT: 15 U/L (ref 0–44)
AST: 14 U/L — AB (ref 15–41)
Anion gap: 7 (ref 5–15)
BILIRUBIN TOTAL: 0.8 mg/dL (ref 0.3–1.2)
BUN: 11 mg/dL (ref 6–20)
CO2: 19 mmol/L — ABNORMAL LOW (ref 22–32)
Calcium: 8.4 mg/dL — ABNORMAL LOW (ref 8.9–10.3)
Chloride: 109 mmol/L (ref 98–111)
Creatinine, Ser: 0.8 mg/dL (ref 0.44–1.00)
GFR calc Af Amer: >60 mL/min (ref >60)
GFR calc non Af Amer: >60 mL/min (ref >60)
GLUCOSE: 148 mg/dL — AB (ref 70–99)
POTASSIUM: 3.8 mmol/L (ref 3.5–5.1)
Sodium: 135 mmol/L (ref 135–145)
TOTAL PROTEIN: 6.3 g/dL — AB (ref 6.5–8.1)

## 2018-06-02 LAB — CBC WITH DIFFERENTIAL/PLATELET
Abs Immature Granulocytes: 0.06 10*3/uL (ref 0.00–0.07)
BASOS ABS: 0 10*3/uL (ref 0.0–0.1)
Basophils Relative: 0 %
Eosinophils Absolute: 0.1 10*3/uL (ref 0.0–0.5)
Eosinophils Relative: 1 %
HEMATOCRIT: 38.4 % (ref 36.0–46.0)
HEMOGLOBIN: 11.9 g/dL — AB (ref 12.0–15.0)
IMMATURE GRANULOCYTES: 1 %
LYMPHS ABS: 2.4 10*3/uL (ref 0.7–4.0)
LYMPHS PCT: 21 %
MCH: 25.8 pg — ABNORMAL LOW (ref 26.0–34.0)
MCHC: 31 g/dL (ref 30.0–36.0)
MCV: 83.3 fL (ref 80.0–100.0)
Monocytes Absolute: 0.8 10*3/uL (ref 0.1–1.0)
Monocytes Relative: 7 %
NEUTROS ABS: 8.3 10*3/uL — AB (ref 1.7–7.7)
NEUTROS PCT: 70 %
NRBC: 0 % (ref 0.0–0.2)
Platelets: 287 10*3/uL (ref 150–400)
RBC: 4.61 MIL/uL (ref 3.87–5.11)
RDW: 15.3 % (ref 11.5–15.5)
WBC: 11.7 10*3/uL — ABNORMAL HIGH (ref 4.0–10.5)

## 2018-06-02 LAB — C DIFFICILE QUICK SCREEN W PCR REFLEX
C Diff antigen: NEGATIVE
C Diff interpretation: NOT DETECTED
C Diff toxin: NEGATIVE

## 2018-06-02 MED ORDER — PANTOPRAZOLE SODIUM 40 MG IV SOLR
40.0000 mg | Freq: Once | INTRAVENOUS | Status: AC
  Administered 2018-06-02: 40 mg via INTRAVENOUS
  Filled 2018-06-02: qty 40

## 2018-06-02 MED ORDER — ONDANSETRON HCL 4 MG/2ML IJ SOLN
4.0000 mg | Freq: Once | INTRAMUSCULAR | Status: AC
  Administered 2018-06-02: 4 mg via INTRAVENOUS
  Filled 2018-06-02: qty 2

## 2018-06-02 MED ORDER — PROMETHAZINE HCL 25 MG/ML IJ SOLN
12.5000 mg | Freq: Once | INTRAMUSCULAR | Status: AC
  Administered 2018-06-02: 12.5 mg via INTRAVENOUS
  Filled 2018-06-02: qty 1

## 2018-06-02 MED ORDER — SODIUM CHLORIDE 0.9 % IV BOLUS
1000.0000 mL | Freq: Once | INTRAVENOUS | Status: AC
  Administered 2018-06-02: 1000 mL via INTRAVENOUS

## 2018-06-02 MED ORDER — ONDANSETRON 4 MG PO TBDP: 4.0000 mg | ORAL_TABLET | Freq: Three times a day (TID) | ORAL | 0 refills | Status: DC | PRN

## 2018-06-02 MED ORDER — LOPERAMIDE HCL 2 MG PO CAPS: 2.0000 mg | ORAL_CAPSULE | Freq: Four times a day (QID) | ORAL | 0 refills | Status: DC | PRN

## 2018-06-02 MED ORDER — LOPERAMIDE HCL 2 MG PO CAPS
4.0000 mg | ORAL_CAPSULE | Freq: Once | ORAL | Status: AC
  Administered 2018-06-02: 4 mg via ORAL
  Filled 2018-06-02: qty 2

## 2018-06-02 NOTE — Discharge Instructions (Signed)
You have been seen in the Emergency Department (ED) today for nausea and vomiting and diarrhea. You likely have a viral infection.   You have been prescribed Zofran; please use as prescribed as needed for your nausea.  Follow up with your doctor as soon as possible regarding todays emergent visit and your symptoms of nausea.   Return to the ED if you develop abdominal, bloody vomiting, bloody diarrhea, if you are unable to tolerate fluids due to vomiting, or if you develop other symptoms that concern you.

## 2018-06-02 NOTE — ED Triage Notes (Signed)
Patient co NVD onset midnight this evening; co abd cramping, sweats; denies fever. States last emesis just prior to arrival.

## 2018-06-02 NOTE — ED Provider Notes (Signed)
Blood pressure (!) 143/79, pulse 80, temperature (!) 97.5 F (36.4 C), temperature source Oral, resp. rate 18, height 5\' 3"  (1.6 m), weight 121.1 kg, SpO2 100 %, not currently breastfeeding.  Assuming care from Dr. Read Drivers.  In short, Christine Trujillo is a 23 y.o. female with a chief complaint of Vomiting and Diarrhea .  Refer to the original H&P for additional details.  The current plan of care is to f/u labs and reassess after IVF.  07:21 AM Patient with multiple diarrhea episodes. Attempted to re-evaluate but is on the bedside commode. Will give imodium and send stool cultures and c. Diff although the patient is not high risk.   07:40 AM I reevaluated the patient.  She is requesting that her IV be removed and that she be discharged.  She states that "I am ready to go and I have to take care of my baby."  She has 700 mL's of fluid remaining.  Blood work shows mild leukocytosis.  No significant electrolyte abnormality or acute kidney injury.  UA and pregnancy are pending but patient does not want to provide the samples and would like to be discharged.  Suspect that she has a viral infection with vomiting and diarrhea.  No focal abdominal tenderness to suspect surgical pathology or require CT imaging.  I discussed that this is likely very contagious and that unnecessary contact with family or the public.  She should not prepare food and she needs to clean all bathroom services in her house with Clorox.     [name], MD 06/02/18 [phone]

## 2018-06-02 NOTE — ED Provider Notes (Signed)
[facility] Provider Note: Lowella Dell, MD, FACEP  CSN: [id number] MRN: [id number] ARRIVAL: 06/02/18 at 0537 ROOM: [facility]F/OTF   CHIEF COMPLAINT  Vomiting and Diarrhea   HISTORY OF PRESENT ILLNESS  06/02/18 5:50 AM Christine Trujillo is a 23 y.o. female has had nausea, vomiting and diarrhea since about midnight.  This is been accompanied by generalized abdominal burning which she states is "really bad".  She estimates she is vomited 6 times with about as many loose bowel movements.  She has been sweating but denies a fever.  She has not taken anything for this.   Past Medical History:  Diagnosis Date  . Anemia    during pregnancy  . Anxiety   . Depression   . Gallstone   . GERD (gastroesophageal reflux disease)   . Gestational diabetes    glyburide  . Headache   . Migraine   . Obesity   . Paratubal cyst 02/19/2016    Past Surgical History:  Procedure Laterality Date  . CHOLECYSTECTOMY N/A 12/02/2016   Procedure: LAPAROSCOPIC CHOLECYSTECTOMY WITH INTRAOPERATIVE CHOLANGIOGRAM;  Surgeon: Ovidio Kin, MD;  Location: [facility];  Service: General;  Laterality: N/A;  . WISDOM TOOTH EXTRACTION      Family History  Problem Relation Age of Onset  . Diabetes Mother   . Heart disease Mother        CHF    Social History   Tobacco Use  . Smoking status: Current Every Day Smoker    Types: Cigars  . Smokeless tobacco: Never Used  . Tobacco comment: 3  black n milds a day  Substance Use Topics  . Alcohol use: No  . Drug use: No    Comment: last use Nov 2017    Prior to Admission medications   Medication Sig Start Date End Date Taking? Authorizing Provider  cyclobenzaprine (FLEXERIL) 10 MG tablet Take 1 tablet (10 mg total) by mouth at bedtime. 09/01/17   Copland, Gwenlyn Found, MD  Dietary Management Product (ENLYTE) CAPS Take 1 tablet by mouth daily. 03/24/17   Sharlene Dory, DO  FLUoxetine (PROZAC) 20 MG tablet Take 1 tablet (20 mg total) by mouth daily.  03/24/17   Sharlene Dory, DO  loperamide (IMODIUM) 2 MG capsule Take 1 capsule (2 mg total) by mouth 4 (four) times daily as needed for diarrhea or loose stools. 06/02/18   Long, Arlyss Repress, MD  medroxyPROGESTERone (DEPO-PROVERA) 150 MG/ML injection Inject 1 mL (150 mg total) into the muscle every 3 (three) months. 11/12/16   Levie Heritage, DO  naproxen (NAPROSYN) 500 MG tablet Take 1 tablet (500 mg total) by mouth 2 (two) times daily as needed. for pain 06/30/17   Sharlene Dory, DO  ondansetron (ZOFRAN ODT) 4 MG disintegrating tablet Take 1 tablet (4 mg total) by mouth every 8 (eight) hours as needed for nausea or vomiting. 06/02/18   Long, Arlyss Repress, MD  triamcinolone cream (KENALOG) 0.1 % Apply 1 application topically 2 (two) times daily. 05/10/18   Sharlene Dory, DO  venlafaxine XR (EFFEXOR-XR) 75 MG 24 hr capsule Take 1 capsule (75 mg total) by mouth daily with breakfast. 05/10/18   Sharlene Dory, DO    Allergies Patient has no known allergies.   REVIEW OF SYSTEMS  Negative except as noted here or in the History of Present Illness.   PHYSICAL EXAMINATION  Initial Vital Signs Blood pressure (!) 136/98, pulse 65, temperature (!) 97.5 F (36.4 C), temperature source Oral, resp. rate  16, height 5\' 3"  (1.6 m), weight 121.1 kg, SpO2 100 %, not currently breastfeeding.  Examination General: Well-developed, obese female in no acute distress; appearance consistent with age of record HENT: normocephalic; atraumatic Eyes: pupils equal, round and reactive to light; extraocular muscles intact Neck: supple Heart: regular rate and rhythm Lungs: clear to auscultation bilaterally Abdomen: soft; nondistended; diffuse tenderness; bowel sounds present Extremities: No deformity; full range of motion; pulses normal Neurologic: Awake, alert and oriented; motor function intact in all extremities and symmetric; no facial droop Skin: Warm and dry Psychiatric: Flat  affect   RESULTS  Summary of this visit's results, reviewed by myself:   EKG Interpretation  Date/Time:    Ventricular Rate:    PR Interval:    QRS Duration:   QT Interval:    QTC Calculation:   R Axis:     Text Interpretation:        Laboratory Studies: No results found for this or any previous visit (from the past 24 hour(s)). Imaging Studies: No results found.  ED COURSE and MDM  Nursing notes and initial vitals signs, including pulse oximetry, reviewed.  Vitals:   06/02/18 0549 06/02/18 0550 06/02/18 0656 06/02/18 0740  BP:   (!) 143/79 133/81  Pulse:   80 60  Resp:   18 18  Temp: (!) 97.5 F (36.4 C)     TempSrc: Oral     SpO2:   100% 100%  Weight:  121.1 kg    Height:  5\' 3"  (1.6 m)     6:44 AM Patient given IV fluid bolus and IV Zofran.  Patient states her pain has abated but she is still going to the bathroom with vomiting and diarrhea.   6:49 AM Patient became weak and bathroom and slid to the ground.  She was assisted up and return to her room.  Additional IV fluids ordered.  Her husband states she has a history of getting "weak in the legs".  PROCEDURES    ED DIAGNOSES     ICD-10-CM   1. Nausea vomiting and diarrhea R11.2    R19.7        Christine Hulgan, MD 06/03/18 1053

## 2018-06-13 NOTE — Progress Notes (Signed)
Noted. Agree with above.  [name] Spencer, DO 06/13/18 2:38 PM

## 2018-08-22 ENCOUNTER — Other Ambulatory Visit: Payer: Self-pay

## 2018-08-22 ENCOUNTER — Ambulatory Visit (INDEPENDENT_AMBULATORY_CARE_PROVIDER_SITE_OTHER): Payer: Medicaid Other | Admitting: *Deleted

## 2018-08-22 DIAGNOSIS — Z3042 Encounter for surveillance of injectable contraceptive: Secondary | ICD-10-CM | POA: Diagnosis not present

## 2018-08-22 DIAGNOSIS — Z309 Encounter for contraceptive management, unspecified: Secondary | ICD-10-CM

## 2018-08-22 MED ORDER — MEDROXYPROGESTERONE ACETATE 150 MG/ML IM SUSP
150.0000 mg | Freq: Once | INTRAMUSCULAR | Status: AC
  Administered 2018-08-22: 11:00:00 150 mg via INTRAMUSCULAR

## 2018-08-22 NOTE — Patient Instructions (Addendum)
Pre visit review using our clinic review tool, if applicable. No additional management support is needed unless otherwise documented below in the visit note. Patient is here today for Depo Provera injection. Patient is in her window. 150 mg IM given in left   Patient tolerated well.Next injection due July 31-August 14th. Next appointment scheduled for  11/14/2018 at 10:45am     After obtaining consent, and per orders of Dr. Carmelia Roller, injection of Depo-Provera 150 mg given by [facility]. Patient tolerated injection well, and instructed to report any adverse reaction to [place] immediately/SLS 05/12

## 2018-08-23 ENCOUNTER — Ambulatory Visit: Payer: Self-pay

## 2018-08-30 NOTE — Progress Notes (Signed)
Noted. Agree with above.  [name] Murphy, DO 08/30/18 11:32 AM

## 2018-09-15 ENCOUNTER — Encounter: Payer: Self-pay | Admitting: Family Medicine

## 2018-09-19 IMAGING — US US OB TRANSVAGINAL
1 series · 15 of 28 positions shown · non-contrast
Comparison: None.

CLINICAL DATA: Pelvic pain for 1 week

EXAM:
OBSTETRIC <14 WK US AND TRANSVAGINAL OB US
TECHNIQUE: Both transabdominal and transvaginal ultrasound examinations were
performed for complete evaluation of the gestation as well as the
maternal uterus, adnexal regions, and pelvic cul-de-sac.
Transvaginal technique was performed to assess early pregnancy.

[Series 1: us ob transvaginal · 70 acquisitions, 15 frames shown]
[im 1/70]
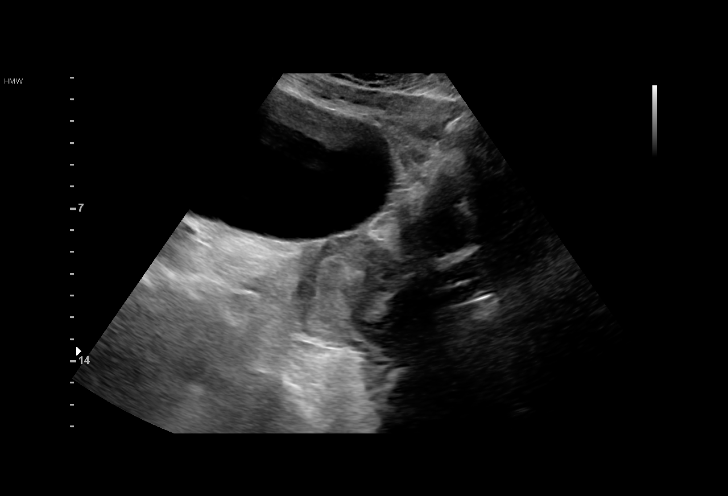
[im 6/70]
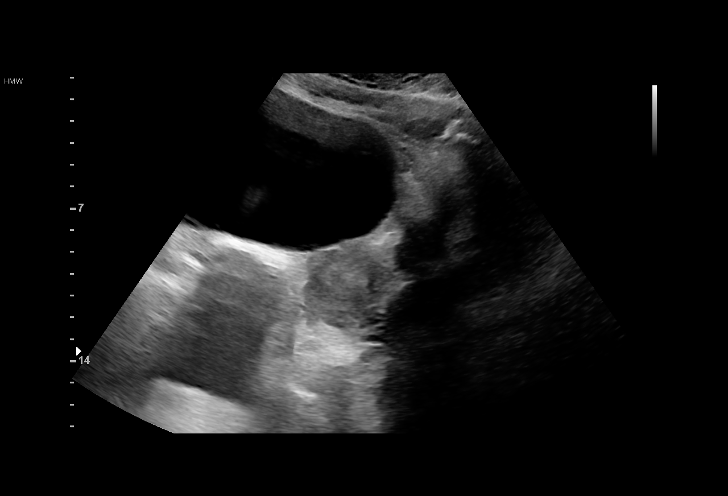
[im 11/70]
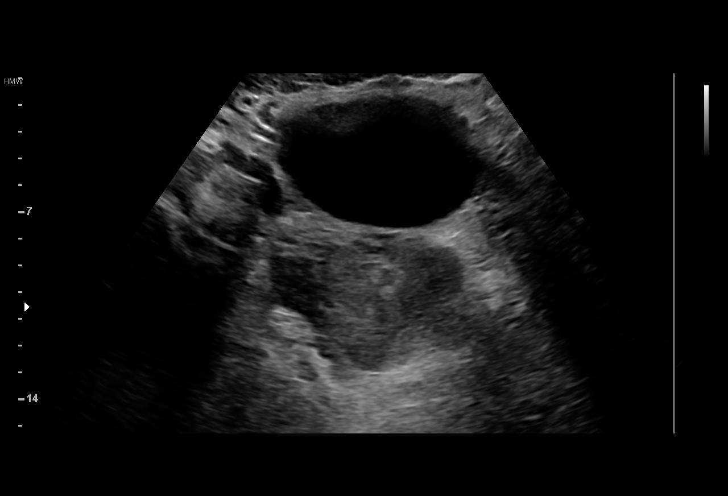
[im 16/70]
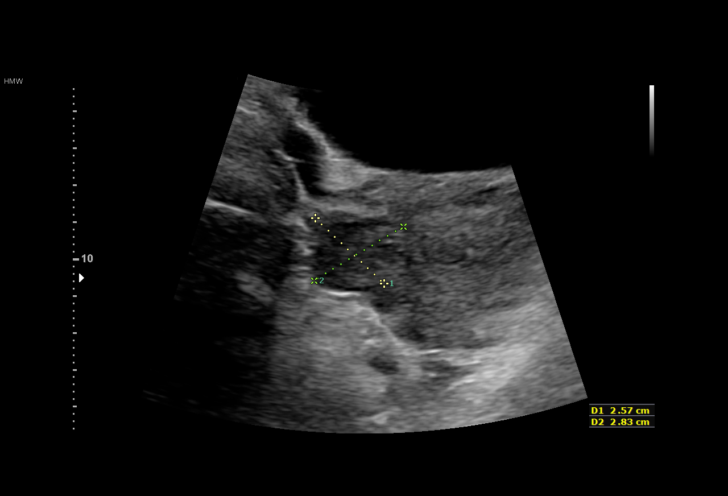
[im 21/70]
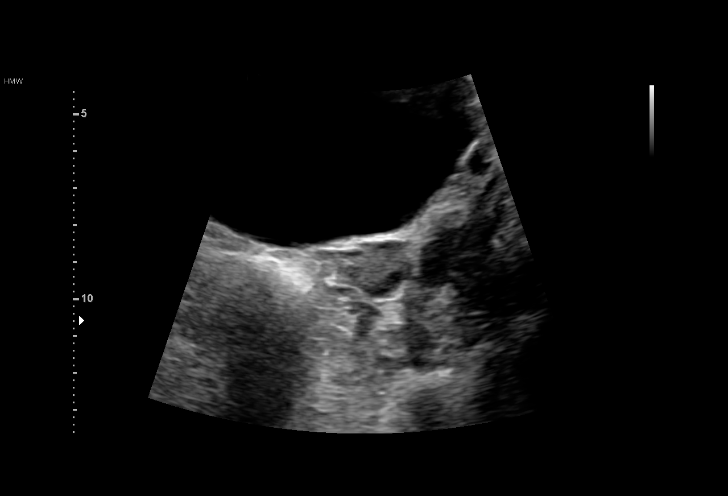
[im 26/70]
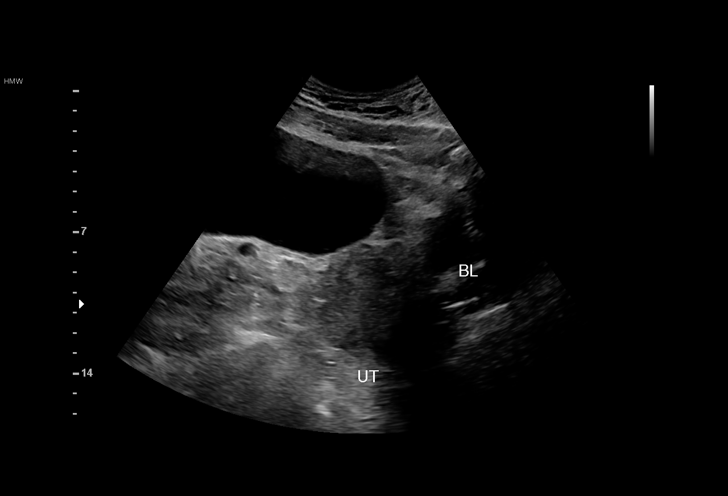
[im 31/70]
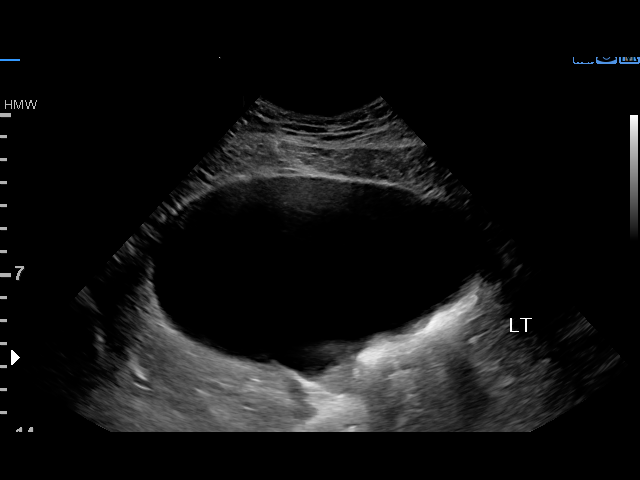
[im 36/70]
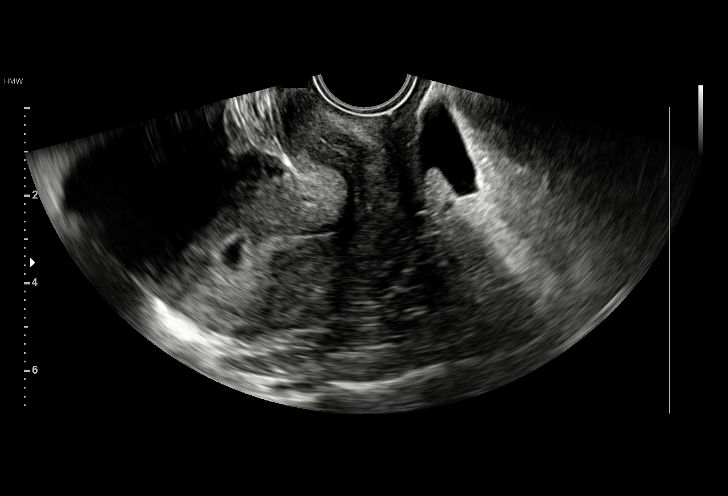
[im 39/70]
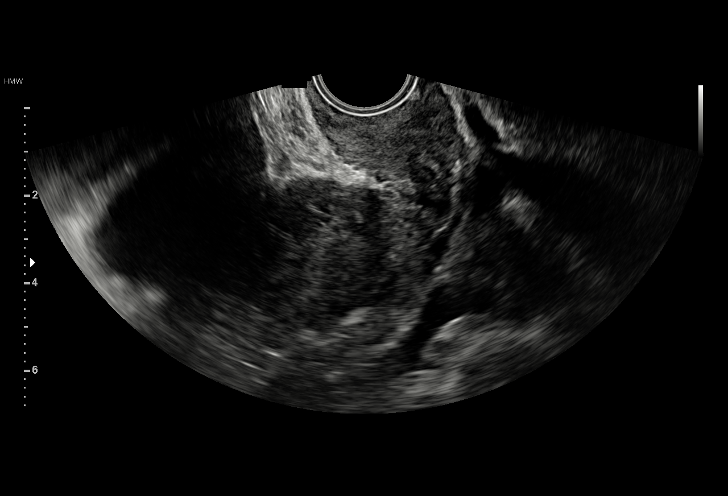
[im 44/70]
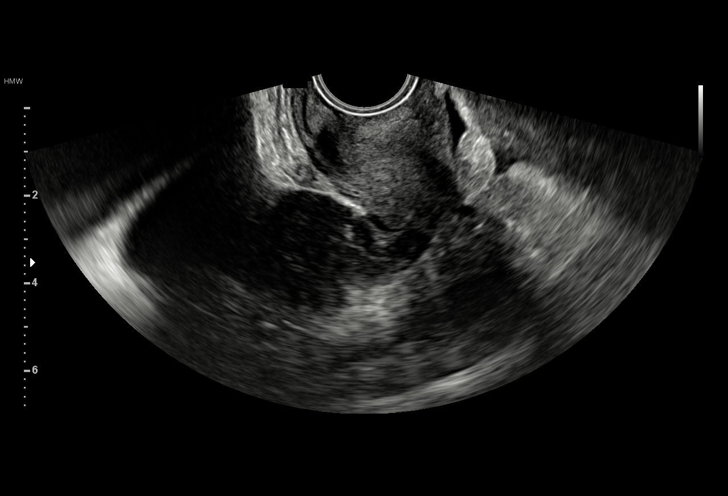
[im 49/70]
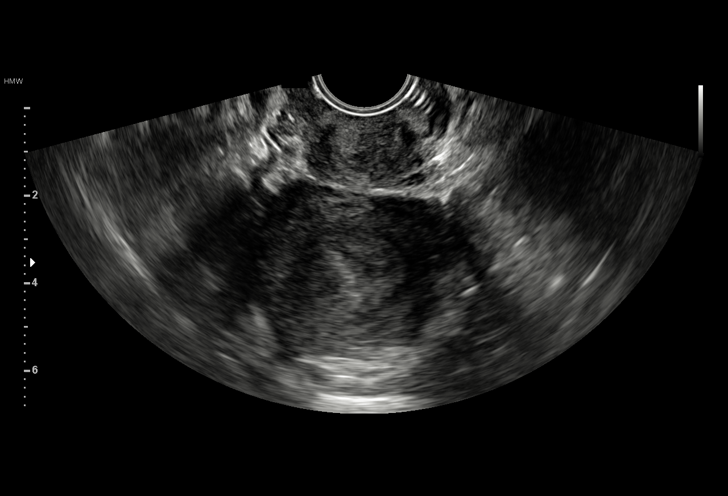
[im 54/70]
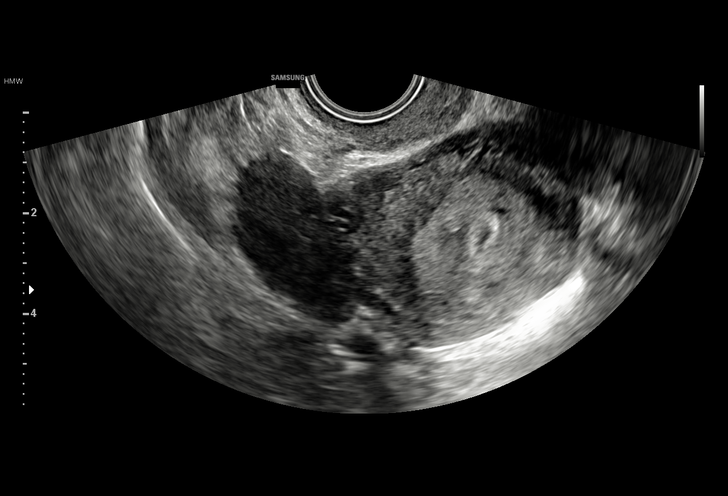
[im 59/70]
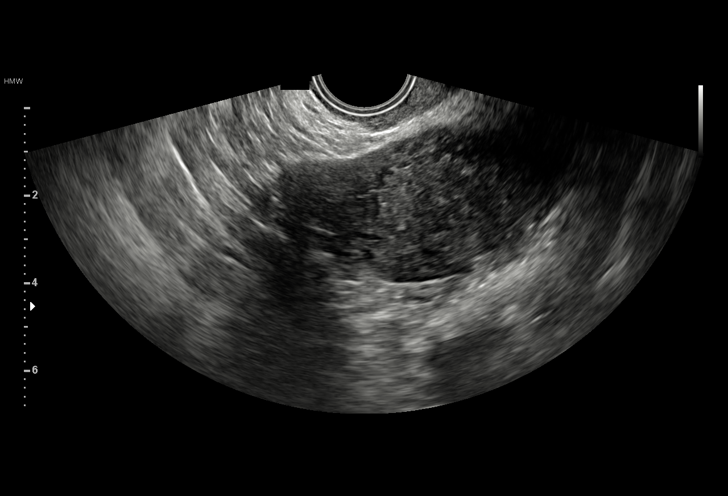
[im 64/70]
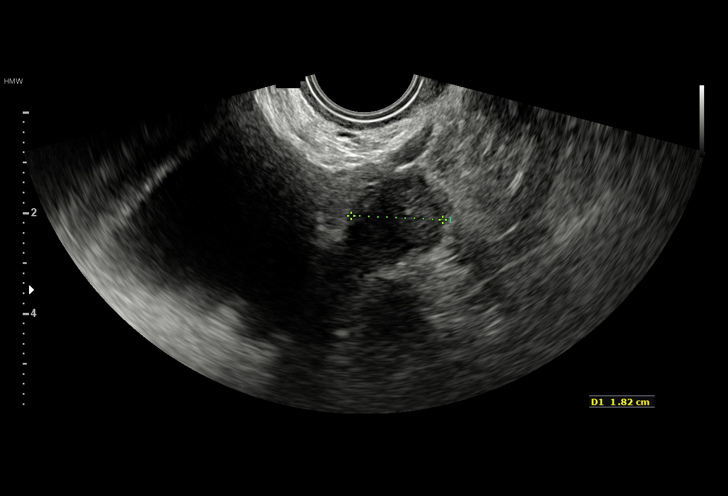
[im 70/70]
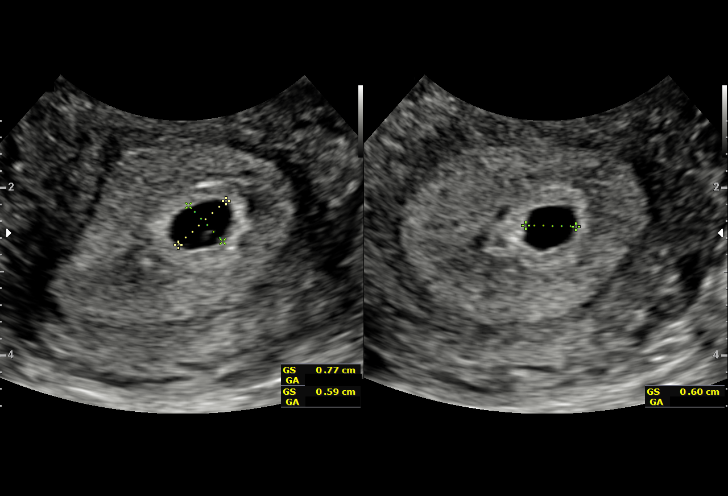

[15 of 28 positions shown; findings below may reference images not displayed]

FINDINGS: Intrauterine gestational sac: Single

Yolk sac:  Visualized.

Embryo:  Not Visualized.

Cardiac Activity: Not Visualized.

Heart Rate: Not applicable

MSD: 6.5  mm   5 w   2  d

Subchorionic hemorrhage:  None visualized.

There is trace free fluid.

Maternal uterus/adnexae: Within the left adnexa, separate from the
left ovary is an anechoic simple appearing well-circumscribed 16.7 x
9 x 14.4 cm cystic mass without mural nodularity or septation.
Differential possibilities might include a large paraovarian cyst,
enteric duplication cyst or possibly seroma from prior surgery among
some considerations though not exclusive.
IMPRESSION: Probable early intrauterine pregnancy with gestational sac and yolk
sac but no fetal pole, or cardiac activity yet visualized. Recommend
follow-up quantitative B-HCG levels and follow-up US in 14 days to
assess viability. This recommendation follows SRU consensus
guidelines: N Engl J Med 1312; [DATE].

Left adnexal anechoic circumscribed 16.7 x 9 x 14.4 cm simple cyst.
This will also need sonographic monitoring. Cross-sectional imaging
is not suggested at this time given the possibility of early
intrauterine gestation.

## 2018-11-14 ENCOUNTER — Ambulatory Visit (INDEPENDENT_AMBULATORY_CARE_PROVIDER_SITE_OTHER): Payer: Medicaid Other | Admitting: *Deleted

## 2018-11-14 ENCOUNTER — Other Ambulatory Visit: Payer: Self-pay

## 2018-11-14 DIAGNOSIS — Z3042 Encounter for surveillance of injectable contraceptive: Secondary | ICD-10-CM | POA: Diagnosis not present

## 2018-11-14 DIAGNOSIS — Z309 Encounter for contraceptive management, unspecified: Secondary | ICD-10-CM

## 2018-11-14 MED ORDER — MEDROXYPROGESTERONE ACETATE 150 MG/ML IM SUSP
150.0000 mg | Freq: Once | INTRAMUSCULAR | Status: AC
  Administered 2018-11-14: 150 mg via INTRAMUSCULAR

## 2018-11-14 NOTE — Progress Notes (Signed)
Patient her for Depo Provera.  Last given 08/22/18.  She is in her window for next injection.  Injection given and tolerated well.  Patient will call for next appointment.  She is due between 01/30/20-02/13/2019

## 2018-12-25 ENCOUNTER — Telehealth: Payer: Self-pay | Admitting: Gastroenterology

## 2018-12-25 IMAGING — US US MFM OB DETAIL+14 WK
1 series · 14 of 28 positions shown · non-contrast
Comparison: none

[Series 1: us mfm ob detail+14 wk · 103 acquisitions, 14 frames shown]
[im 4/103]
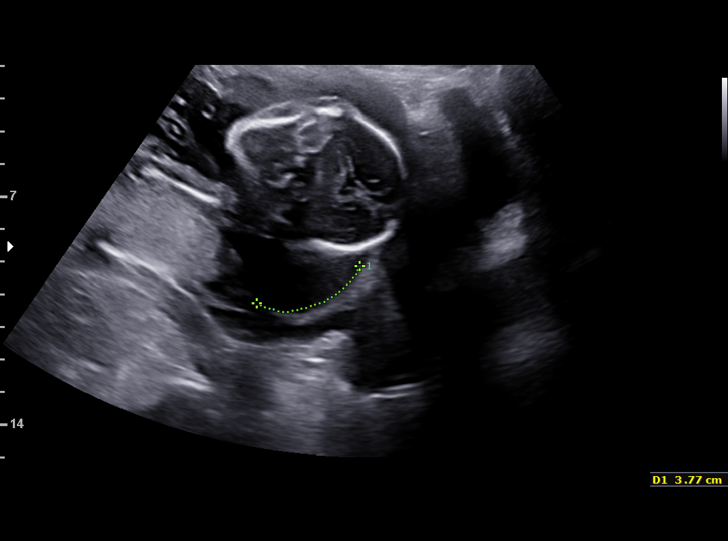
[im 12/103]
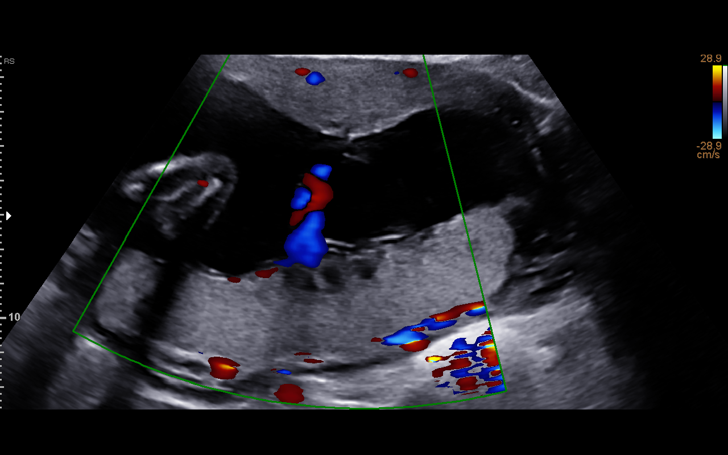
[im 19/103]
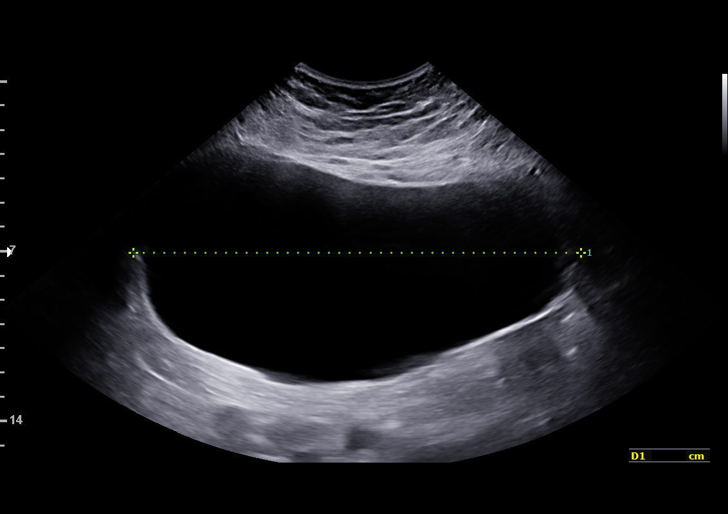
[im 27/103]
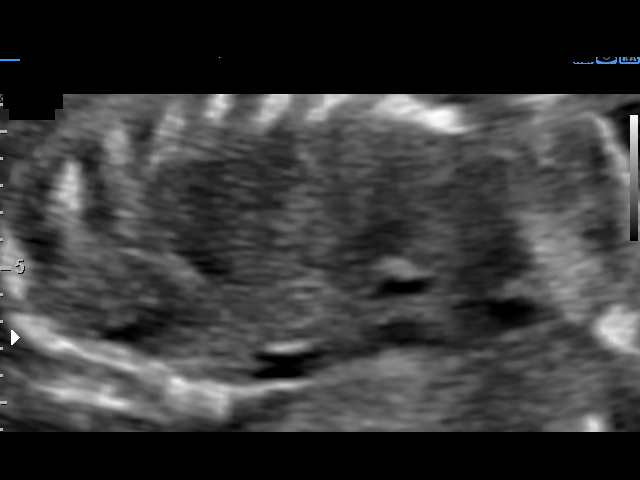
[im 35/103]
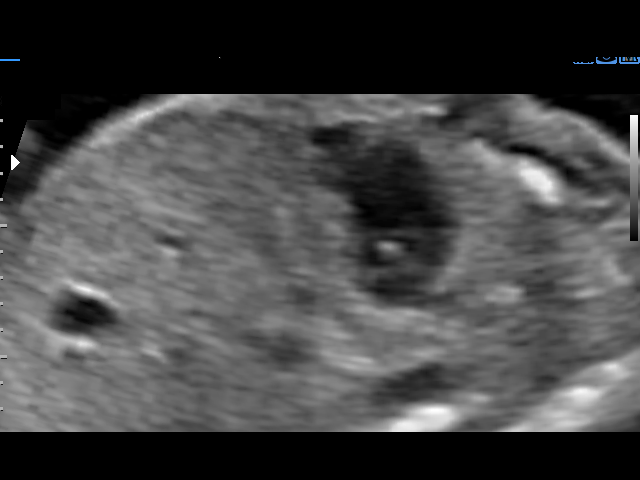
[im 42/103]
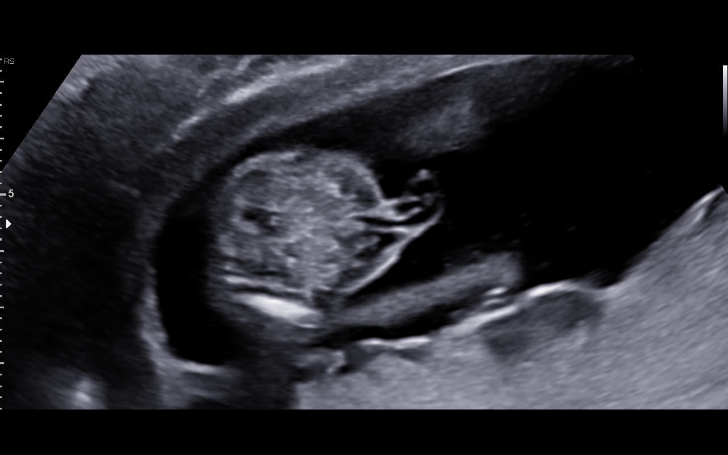
[im 50/103]
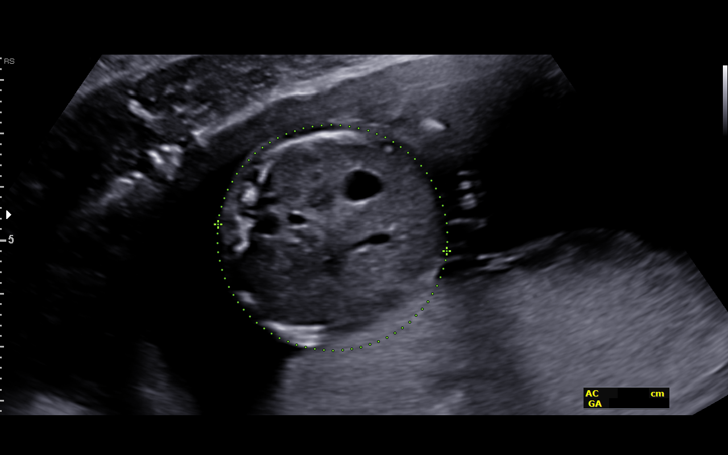
[im 57/103]
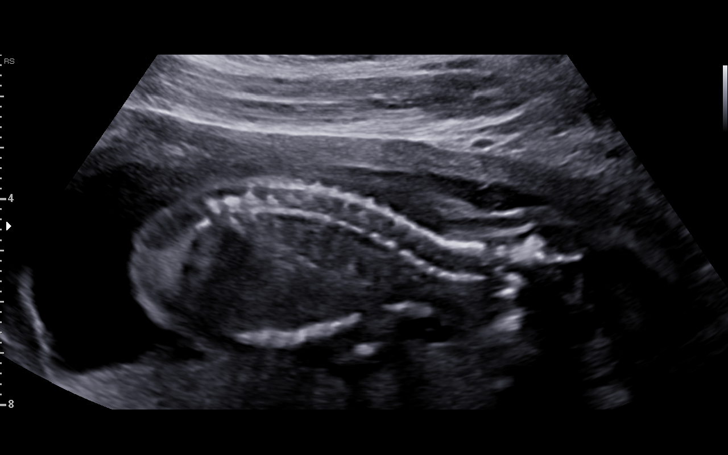
[im 65/103]
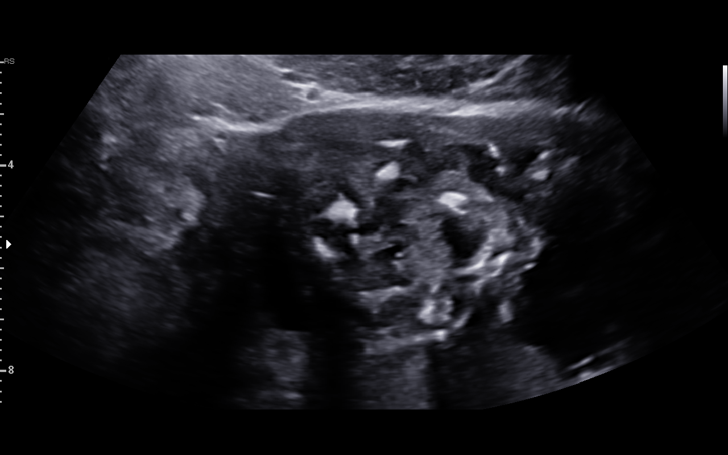
[im 72/103]
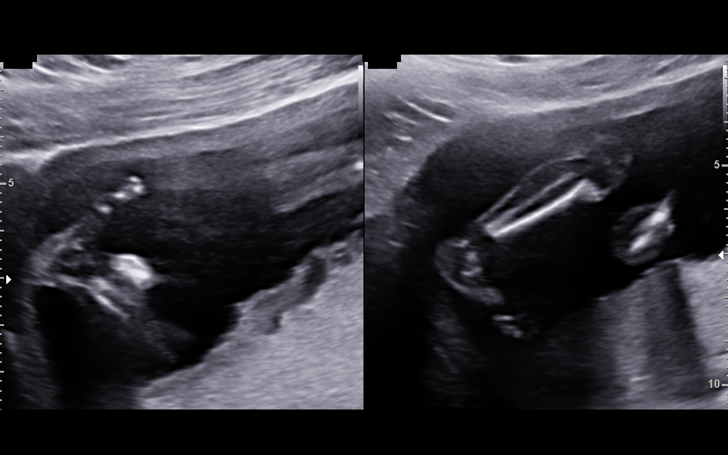
[im 80/103]
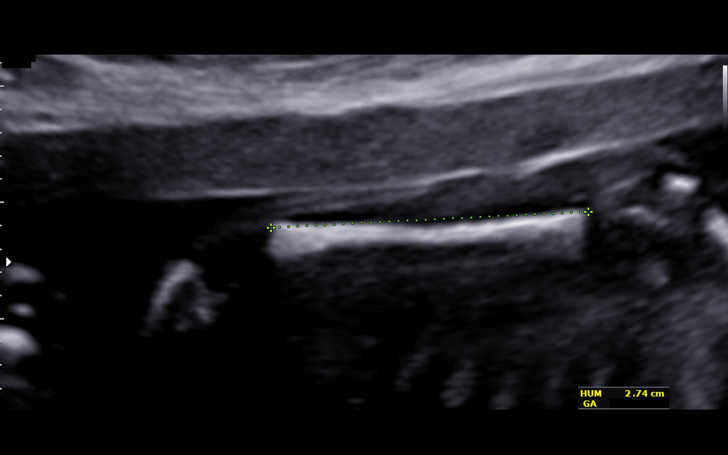
[im 87/103]
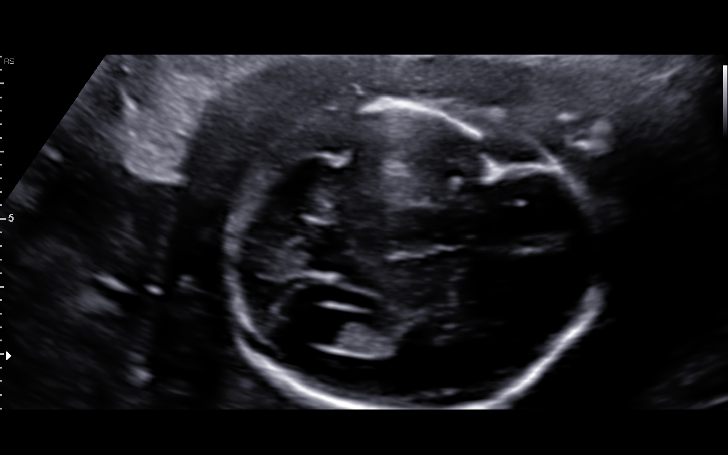
[im 95/103]
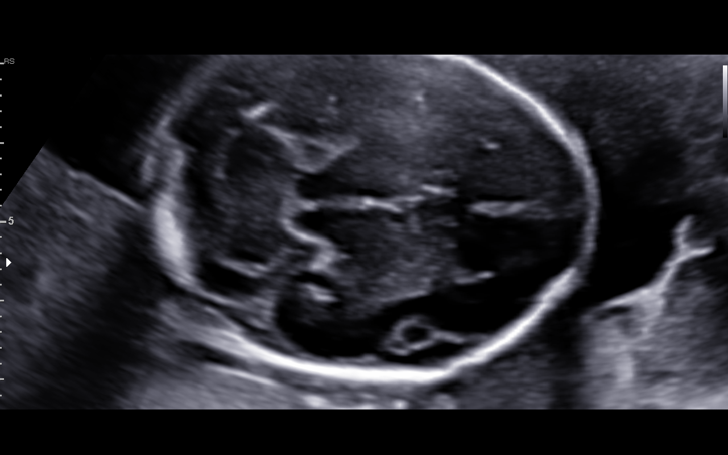
[im 103/103]
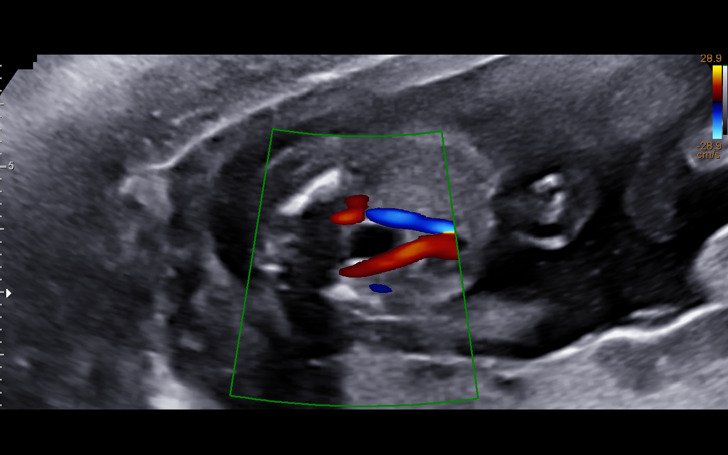

[14 of 28 positions shown; findings below may reference images not displayed]

OB/Gyn Clinic

1  BREEANA BOAT          [id number]      [id number]     [id number]
Indications

19 weeks gestation of pregnancy
Encounter for antenatal screening for
malformations
Ovarian cyst complicating pregnancy
Obesity complicating pregnancy, second
trimester (BMI 49)
OB History

Blood Type:            Height:  5'3"   Weight (lb):  276       BMI:
Gravidity:    1         Term:   0        Prem:   0         SAB:   0
TOP:          0       Ectopic:  0        Living: 0
Fetal Evaluation

Num Of Fetuses:     1
Fetal Heart         148
Rate(bpm):
Cardiac Activity:   Observed
Presentation:       Cephalic
Placenta:           Posterior, above cervical os
P. Cord Insertion:  Visualized, central

Amniotic Fluid
AFI FV:      Subjectively within normal limits

Largest Pocket(cm)
5.20
Biometry

BPD:      43.4  mm     G. Age:  19w 1d         50  %    CI:          68.3  %    70 - 86
FL/HC:       17.1  %    16.1 -
HC:      167.9  mm     G. Age:  19w 3d         57  %    HC/AC:       1.22       1.09 -
AC:      137.3  mm     G. Age:  19w 1d         46  %    FL/BPD:      66.1  %
FL:       28.7  mm     G. Age:  18w 6d         32  %    FL/AC:       20.9  %    20 - 24
HUM:      27.1  mm     G. Age:  18w 4d         37  %
CER:      19.4  mm     G. Age:  18w 5d         38  %
NFT:         3  mm

CM:          5  mm

Est. FW:     271   gm    0 lb 10 oz     43  %
Gestational Age

LMP:           19w 1d        Date:  01/12/16                 EDD:    10/18/16
U/S Today:     19w 1d                                        EDD:    10/18/16
Best:          19w 1d     Det. By:  LMP  (01/12/16)          EDD:    10/18/16
Anatomy

Cranium:               Appears normal         Aortic Arch:            Not well visualized
Cavum:                 Appears normal         Ductal Arch:            Not well visualized
Ventricles:            Appears normal         Diaphragm:              Appears normal
Choroid Plexus:        Appears normal         Stomach:                Appears normal, left
sided
Cerebellum:            Appears normal         Abdomen:                Appears normal
Posterior Fossa:       Appears normal         Abdominal Wall:         Appears nml (cord
insert, abd wall)
Nuchal Fold:           Appears normal         Cord Vessels:           Appears normal (3
vessel cord)
Face:                  Appears normal         Kidneys:                Appear normal
(orbits and profile)
Lips:                  Appears normal         Bladder:                Appears normal
Thoracic:              Appears normal         Spine:                  Appears normal
Heart:                 Not well visualized    Upper Extremities:      Appears normal
RVOT:                  Appears normal         Lower Extremities:      Appears normal
LVOT:                  Appears normal

Other:  Fetus appears to be a female. Heels and 5th digit visualized. Nasal
bone visualized. Technically difficult due to maternal habitus and fetal
position.
Cervix Uterus Adnexa

Cervix
Normal appearance by transabdominal scan.

Uterus
No abnormality visualized.

Left Ovary
Not visualized.

Right Ovary
.Large right ovarian cyst seen as on prior ultrasound.

Adnexa:       No abnormality visualized. No adnexal mass
visualized.
Impression

SIUP at 19+1 weeks
Normal detailed fetal anatomy; limited views of heart
Markers of aneuploidy: none
Normal amniotic fluid volume
Measurements consistent with LMP dating
Large, right ovarian cyst again visualized (14 x 10 x 18 cms)
Recommendations

Follow-up ultrasound in 4-6 weeks to complete anatomy
survey

## 2018-12-25 NOTE — Telephone Encounter (Signed)
Error

## 2018-12-26 ENCOUNTER — Emergency Department (HOSPITAL_BASED_OUTPATIENT_CLINIC_OR_DEPARTMENT_OTHER)
Admission: EM | Admit: 2018-12-26 | Discharge: 2018-12-27 | Disposition: A | Discharge status: Home / Self Care | Payer: Medicaid Other | Attending: Emergency Medicine | Admitting: Emergency Medicine

## 2018-12-26 ENCOUNTER — Other Ambulatory Visit: Payer: Self-pay

## 2018-12-26 ENCOUNTER — Emergency Department (HOSPITAL_BASED_OUTPATIENT_CLINIC_OR_DEPARTMENT_OTHER): Payer: Medicaid Other

## 2018-12-26 ENCOUNTER — Encounter (HOSPITAL_BASED_OUTPATIENT_CLINIC_OR_DEPARTMENT_OTHER): Payer: Self-pay

## 2018-12-26 DIAGNOSIS — Y92810 Car as the place of occurrence of the external cause: Secondary | ICD-10-CM | POA: Diagnosis not present

## 2018-12-26 DIAGNOSIS — Y999 Unspecified external cause status: Secondary | ICD-10-CM | POA: Diagnosis not present

## 2018-12-26 DIAGNOSIS — Y9389 Activity, other specified: Secondary | ICD-10-CM | POA: Diagnosis not present

## 2018-12-26 DIAGNOSIS — S62632A Displaced fracture of distal phalanx of right middle finger, initial encounter for closed fracture: Secondary | ICD-10-CM | POA: Insufficient documentation

## 2018-12-26 DIAGNOSIS — Z79899 Other long term (current) drug therapy: Secondary | ICD-10-CM | POA: Insufficient documentation

## 2018-12-26 DIAGNOSIS — S62639A Displaced fracture of distal phalanx of unspecified finger, initial encounter for closed fracture: Secondary | ICD-10-CM

## 2018-12-26 DIAGNOSIS — F17290 Nicotine dependence, other tobacco product, uncomplicated: Secondary | ICD-10-CM | POA: Insufficient documentation

## 2018-12-26 DIAGNOSIS — S67192A Crushing injury of right middle finger, initial encounter: Secondary | ICD-10-CM | POA: Diagnosis present

## 2018-12-26 DIAGNOSIS — W230XXA Caught, crushed, jammed, or pinched between moving objects, initial encounter: Secondary | ICD-10-CM | POA: Diagnosis not present

## 2018-12-26 MED ORDER — ACETAMINOPHEN 500 MG PO TABS
1000.0000 mg | ORAL_TABLET | Freq: Once | ORAL | Status: AC
  Administered 2018-12-27: 1000 mg via ORAL
  Filled 2018-12-26: qty 2

## 2018-12-26 MED ORDER — NAPROXEN 250 MG PO TABS
500.0000 mg | ORAL_TABLET | Freq: Once | ORAL | Status: AC
  Administered 2018-12-27: 01:00:00 500 mg via ORAL
  Filled 2018-12-26: qty 2

## 2018-12-26 NOTE — ED Triage Notes (Addendum)
Pt states she slammed left middle finger in truck door 9/12-lac noted across nail-NAD-steady gait

## 2018-12-27 ENCOUNTER — Encounter (HOSPITAL_BASED_OUTPATIENT_CLINIC_OR_DEPARTMENT_OTHER): Payer: Self-pay | Admitting: Emergency Medicine

## 2018-12-27 MED ORDER — MELOXICAM 7.5 MG PO TABS: 7.5000 mg | ORAL_TABLET | Freq: Every day | ORAL | 0 refills | Status: DC

## 2018-12-27 NOTE — ED Provider Notes (Signed)
[facility] EMERGENCY DEPARTMENT Provider Note   CSN: [id number] Arrival date & time: 12/26/18  2250     History   Chief Complaint Chief Complaint  Patient presents with  . Finger Injury    HPI Christine Trujillo is a 23 y.o. female.     The history is provided by the patient.  Hand Pain This is a new problem. The current episode started more than 2 days ago (Saturday). The problem occurs constantly. The problem has not changed since onset.Pertinent negatives include no chest pain, no abdominal pain, no headaches and no shortness of breath. Nothing aggravates the symptoms. Nothing relieves the symptoms. She has tried nothing for the symptoms. The treatment provided no relief.  Patient slammed right middle finger in a car door on Saturday.  It is bruised and there is a cut in the fingernail but no subungual hematoma.    Past Medical History:  Diagnosis Date  . Anemia    during pregnancy  . Anxiety   . Depression   . Gallstone   . GERD (gastroesophageal reflux disease)   . Gestational diabetes    glyburide  . Headache   . Migraine   . Obesity   . Paratubal cyst 02/19/2016    Patient Active Problem List   Diagnosis Date Noted  . Dermatitis 05/10/2018  . Closed fracture of tooth 05/10/2018  . Low back pain 07/21/2016  . BMI 45.0-49.9, adult (HCC) 04/22/2016  . Depression, recurrent (HCC) 04/22/2016  . Right ovarian cyst affecting pregnancy in second trimester, antepartum 03/25/2016    Past Surgical History:  Procedure Laterality Date  . CHOLECYSTECTOMY N/A 12/02/2016   Procedure: LAPAROSCOPIC CHOLECYSTECTOMY WITH INTRAOPERATIVE CHOLANGIOGRAM;  Surgeon: Ovidio KinNewman, David, MD;  Location: [facility];  Service: General;  Laterality: N/A;  . WISDOM TOOTH EXTRACTION       OB History    Gravida  1   Para  1   Term  1   Preterm      AB      Living  1     SAB      TAB      Ectopic      Multiple  0   Live Births  1            Home Medications     Prior to Admission medications   Medication Sig Start Date End Date Taking? Authorizing Provider  cyclobenzaprine (FLEXERIL) 10 MG tablet Take 1 tablet (10 mg total) by mouth at bedtime. 09/01/17   Copland, Gwenlyn FoundJessica C, MD  Dietary Management Product (ENLYTE) CAPS Take 1 tablet by mouth daily. 03/24/17   Sharlene DoryWendling, Nicholas Paul, DO  FLUoxetine (PROZAC) 20 MG tablet Take 1 tablet (20 mg total) by mouth daily. 03/24/17   Sharlene DoryWendling, Nicholas Paul, DO  loperamide (IMODIUM) 2 MG capsule Take 1 capsule (2 mg total) by mouth 4 (four) times daily as needed for diarrhea or loose stools. 06/02/18   Long, Arlyss RepressJoshua G, MD  medroxyPROGESTERone (DEPO-PROVERA) 150 MG/ML injection Inject 1 mL (150 mg total) into the muscle every 3 (three) months. 11/12/16   Levie HeritageStinson, Jacob J, DO  meloxicam (MOBIC) 7.5 MG tablet Take 1 tablet (7.5 mg total) by mouth daily. 12/27/18   Haidan Nhan, MD  naproxen (NAPROSYN) 500 MG tablet Take 1 tablet (500 mg total) by mouth 2 (two) times daily as needed. for pain 06/30/17   Sharlene DoryWendling, Nicholas Paul, DO  ondansetron (ZOFRAN ODT) 4 MG disintegrating tablet Take 1 tablet (4 mg total) by  mouth every 8 (eight) hours as needed for nausea or vomiting. 06/02/18   Long, Arlyss Repress, MD  triamcinolone cream (KENALOG) 0.1 % Apply 1 application topically 2 (two) times daily. 05/10/18   Sharlene Dory, DO  venlafaxine XR (EFFEXOR-XR) 75 MG 24 hr capsule Take 1 capsule (75 mg total) by mouth daily with breakfast. 05/10/18   Wendling, [name], DO    Family History Family History  Problem Relation Age of Onset  . Diabetes Mother   . Heart disease Mother        CHF    Social History Social History   Tobacco Use  . Smoking status: Current Every Day Smoker    Types: Cigars  . Smokeless tobacco: Never Used  Substance Use Topics  . Alcohol use: No  . Drug use: No     Allergies   Patient has no known allergies.   Review of Systems Review of Systems  Constitutional: Negative for  fever.  HENT: Negative for congestion.   Eyes: Negative for visual disturbance.  Respiratory: Negative for shortness of breath.   Cardiovascular: Negative for chest pain.  Gastrointestinal: Negative for abdominal pain.  Genitourinary: Negative for difficulty urinating.  Musculoskeletal: Positive for arthralgias.  Neurological: Negative for weakness, numbness and headaches.  Psychiatric/Behavioral: Negative for agitation.  All other systems reviewed and are negative.    Physical Exam Updated Vital Signs BP 132/78 (BP Location: Left Arm)   Pulse 74   Temp 98.2 F (36.8 C) (Oral)   Resp 16   Ht 5\' 3"  (1.6 m)   Wt 124.7 kg   SpO2 98%   BMI 48.71 kg/m   Physical Exam Vitals signs and nursing note reviewed.  Constitutional:      General: She is not in acute distress.    Appearance: She is normal weight.  HENT:     Head: Normocephalic and atraumatic.     Nose: Nose normal.  Eyes:     Conjunctiva/sclera: Conjunctivae normal.     Pupils: Pupils are equal, round, and reactive to light.  Neck:     Musculoskeletal: Normal range of motion and neck supple.  Cardiovascular:     Rate and Rhythm: Normal rate and regular rhythm.     Pulses: Normal pulses.     Heart sounds: Normal heart sounds.  Pulmonary:     Effort: Pulmonary effort is normal.     Breath sounds: Normal breath sounds.  Abdominal:     General: Abdomen is flat. Bowel sounds are normal.     Tenderness: There is no abdominal tenderness. There is no guarding.  Musculoskeletal: Normal range of motion.     Right wrist: Normal.     Right hand: She exhibits normal range of motion, normal two-point discrimination and normal capillary refill. Normal sensation noted. Normal strength noted.       Hands:  Skin:    General: Skin is warm and dry.     Capillary Refill: Capillary refill takes less than 2 seconds.  Neurological:     General: No focal deficit present.     Mental Status: She is alert and oriented to person,  place, and time.     Deep Tendon Reflexes: Reflexes normal.  Psychiatric:        Mood and Affect: Mood normal.        Behavior: Behavior normal.      ED Treatments / Results  Labs (all labs ordered are listed, but only abnormal results are displayed) Labs Reviewed -  No data to display  EKG None  Radiology Dg Finger Middle Right  Result Date: 12/26/2018 CLINICAL DATA:  Slammed finger in door EXAM: RIGHT MIDDLE FINGER 2+V COMPARISON:  None. FINDINGS: Acute comminuted fracture involving the tuft of the third distal phalanx with 1/2 bone with volar displacement of distal fracture fragment. No subluxation. No radiopaque foreign body IMPRESSION: Acute comminuted and displaced tuft fracture of the third distal phalanx Electronically Signed   By: Donavan Foil M.D.   On: 12/26/2018 23:26    Procedures Procedures (including critical care time)  Medications Ordered in ED Medications  naproxen (NAPROSYN) tablet 500 mg (500 mg Oral Given 12/27/18 0033)  acetaminophen (TYLENOL) tablet 1,000 mg (1,000 mg Oral Given 12/27/18 0033)       Finger splint applied.  Ice elevation and NSAIDs and follow up with hand surgery.  Patient verbalizes understanding of all instructions and agrees to follow up.    Final Clinical Impressions(s) / ED Diagnoses   Final diagnoses:  Closed fracture of tuft of distal phalanx of finger   Return for intractable cough, coughing up blood,fevers >100.4 unrelieved by medication, shortness of breath, intractable vomiting, chest pain, shortness of breath, weakness,numbness, changes in speech, facial asymmetry,abdominal pain, passing out,Inability to tolerate liquids or food, cough, altered mental status or any concerns. No signs of systemic illness or infection. The patient is nontoxic-appearing on exam and vital signs are within normal limits.   I have reviewed the triage vital signs and the nursing notes. Pertinent labs &imaging results that were available  during my care of the patient were reviewed by me and considered in my medical decision making (see chart for details).After history, exam, and medical workup I feel the patient has beenappropriately medically screened and is safe for discharge home. Pertinent diagnoses were discussed with the patient. Patient was given return precautions. ED Discharge Orders         Ordered    meloxicam (MOBIC) 7.5 MG tablet  Daily     12/27/18 0040           Francille Wittmann, MD 12/27/18 5188

## 2019-01-03 ENCOUNTER — Other Ambulatory Visit: Payer: Self-pay | Admitting: Emergency Medicine

## 2019-01-03 DIAGNOSIS — Z20822 Contact with and (suspected) exposure to covid-19: Secondary | ICD-10-CM

## 2019-01-05 LAB — NOVEL CORONAVIRUS, NAA: SARS-CoV-2, NAA: NOT DETECTED

## 2019-02-13 ENCOUNTER — Ambulatory Visit (INDEPENDENT_AMBULATORY_CARE_PROVIDER_SITE_OTHER): Payer: Medicaid Other | Admitting: *Deleted

## 2019-02-13 ENCOUNTER — Other Ambulatory Visit: Payer: Self-pay

## 2019-02-13 DIAGNOSIS — Z309 Encounter for contraceptive management, unspecified: Secondary | ICD-10-CM | POA: Diagnosis not present

## 2019-02-13 MED ORDER — MEDROXYPROGESTERONE ACETATE 150 MG/ML IM SUSP
150.0000 mg | Freq: Once | INTRAMUSCULAR | Status: AC
  Administered 2019-02-13: 150 mg via INTRAMUSCULAR

## 2019-02-13 NOTE — Progress Notes (Signed)
Patient her for Depo Provera.  Last given 11/14/18.  She is in her window for this injection.   Injection given in left detloid and patient tolerated well.  Patient will call for next appointment.  She is due between 04/30/18-05/14/18

## 2019-04-20 ENCOUNTER — Ambulatory Visit (INDEPENDENT_AMBULATORY_CARE_PROVIDER_SITE_OTHER): Payer: Medicaid Other | Admitting: Family Medicine

## 2019-04-20 ENCOUNTER — Encounter: Payer: Self-pay | Admitting: Family Medicine

## 2019-04-20 ENCOUNTER — Other Ambulatory Visit: Payer: Self-pay

## 2019-04-20 VITALS — Temp 97.5°F

## 2019-04-20 DIAGNOSIS — F339 Major depressive disorder, recurrent, unspecified: Secondary | ICD-10-CM

## 2019-04-20 DIAGNOSIS — F411 Generalized anxiety disorder: Secondary | ICD-10-CM | POA: Diagnosis not present

## 2019-04-20 MED ORDER — FLUOXETINE HCL 20 MG PO TABS: 20.0000 mg | ORAL_TABLET | Freq: Every day | ORAL | 3 refills | Status: DC

## 2019-04-20 NOTE — Progress Notes (Signed)
Chief Complaint  Patient presents with  . Depression    Subjective Christine Trujillo presents for f/u anxiety/depression. Due to COVID-19 pandemic, we are interacting via web portal for an electronic face-to-face visit. I verified patient's ID using 2 identifiers. Patient agreed to proceed with visit via this method. Patient is at home, I am at office. Patient, her child's father Gabriel Rung, and I are present for visit.   She is currently not on medication.  Was on Effexor and Prozac in past, seemed to do better with latter.  No thoughts of harming self or others. No self-medication with alcohol, prescription drugs or illicit drugs. Pt is not following with a counselor/psychologist.  ROS Psych: No homicidal or suicidal thoughts  Past Medical History:  Diagnosis Date  . Anemia    during pregnancy  . Anxiety   . Depression   . Gallstone   . GERD (gastroesophageal reflux disease)   . Gestational diabetes    glyburide  . Headache   . Migraine   . Obesity   . Paratubal cyst 02/19/2016   Exam Temp (!) 97.5 F (36.4 C) (Oral)  No conversational dyspnea Age appropriate judgment and insight Nml affect and mood  Assessment and Plan  Depression, recurrent (HCC) - Plan: FLUoxetine (PROZAC) 20 MG tablet  GAD (generalized anxiety disorder) - Plan: FLUoxetine (PROZAC) 20 MG tablet  Orders as above. 1/2 tab daily for 2 weeks. Counseled on exercise. Rec'd she try to sched counseling appt.  F/u in 4 weeks. The patient voiced understanding and agreement to the plan.  [name] Clayton, DO 04/20/19 4:15 PM

## 2019-06-01 ENCOUNTER — Ambulatory Visit: Payer: Medicaid Other

## 2019-06-01 ENCOUNTER — Telehealth: Payer: Self-pay | Admitting: *Deleted

## 2019-06-01 NOTE — Telephone Encounter (Signed)
Caller Name [facility] Caller Phone Number [phone] Patient Name Christine Trujillo Patient DOB 1996/03/08 Call Type Message Only Information Provided Reason for Call Request to Reschedule Office Appointment Initial Comment Caller is calling to reschedule her appointment at 10:45. Additional Comment Caller declined triage. Provided caller with the office hours.

## 2019-06-01 NOTE — Telephone Encounter (Signed)
Left message to call back to reschedule

## 2019-06-08 ENCOUNTER — Other Ambulatory Visit: Payer: Self-pay

## 2019-06-08 ENCOUNTER — Ambulatory Visit (INDEPENDENT_AMBULATORY_CARE_PROVIDER_SITE_OTHER): Payer: Medicaid Other

## 2019-06-08 DIAGNOSIS — Z309 Encounter for contraceptive management, unspecified: Secondary | ICD-10-CM | POA: Diagnosis not present

## 2019-06-08 LAB — POCT URINE PREGNANCY: Preg Test, Ur: NEGATIVE

## 2019-06-08 MED ORDER — MEDROXYPROGESTERONE ACETATE 150 MG/ML IM SUSP
150.0000 mg | Freq: Once | INTRAMUSCULAR | Status: AC
  Administered 2019-06-08: 11:00:00 150 mg via INTRAMUSCULAR

## 2019-06-08 NOTE — Progress Notes (Signed)
Patient was seen in the office for Depo. Right deltoid, patient tolerated injection well.

## 2019-07-28 ENCOUNTER — Encounter (HOSPITAL_BASED_OUTPATIENT_CLINIC_OR_DEPARTMENT_OTHER): Payer: Self-pay | Admitting: Emergency Medicine

## 2019-07-28 ENCOUNTER — Emergency Department (HOSPITAL_BASED_OUTPATIENT_CLINIC_OR_DEPARTMENT_OTHER)
Admission: EM | Admit: 2019-07-28 | Discharge: 2019-07-28 | Disposition: A | Discharge status: Home / Self Care | Payer: Medicaid Other | Attending: Emergency Medicine | Admitting: Emergency Medicine

## 2019-07-28 ENCOUNTER — Other Ambulatory Visit: Payer: Self-pay

## 2019-07-28 DIAGNOSIS — F17290 Nicotine dependence, other tobacco product, uncomplicated: Secondary | ICD-10-CM | POA: Insufficient documentation

## 2019-07-28 DIAGNOSIS — Z79899 Other long term (current) drug therapy: Secondary | ICD-10-CM | POA: Insufficient documentation

## 2019-07-28 DIAGNOSIS — R197 Diarrhea, unspecified: Secondary | ICD-10-CM | POA: Insufficient documentation

## 2019-07-28 DIAGNOSIS — R1110 Vomiting, unspecified: Secondary | ICD-10-CM | POA: Diagnosis not present

## 2019-07-28 LAB — COMPREHENSIVE METABOLIC PANEL
ALT: 16 U/L (ref 0–44)
AST: 13 U/L — ABNORMAL LOW (ref 15–41)
Albumin: 4.1 g/dL (ref 3.5–5.0)
Alkaline Phosphatase: 82 U/L (ref 38–126)
Anion gap: 13 (ref 5–15)
BUN: 10 mg/dL (ref 6–20)
CO2: 22 mmol/L (ref 22–32)
Calcium: 9.2 mg/dL (ref 8.9–10.3)
Chloride: 105 mmol/L (ref 98–111)
Creatinine, Ser: 0.78 mg/dL (ref 0.44–1.00)
GFR calc Af Amer: >60 mL/min (ref >60)
GFR calc non Af Amer: >60 mL/min (ref >60)
Glucose, Bld: 152 mg/dL — ABNORMAL HIGH (ref 70–99)
Potassium: 3.7 mmol/L (ref 3.5–5.1)
Sodium: 140 mmol/L (ref 135–145)
Total Bilirubin: 1 mg/dL (ref 0.3–1.2)
Total Protein: 7.5 g/dL (ref 6.5–8.1)

## 2019-07-28 LAB — CBC WITH DIFFERENTIAL/PLATELET
Abs Immature Granulocytes: 0.07 10*3/uL (ref 0.00–0.07)
Basophils Absolute: 0 10*3/uL (ref 0.0–0.1)
Basophils Relative: 0 %
Eosinophils Absolute: 0 10*3/uL (ref 0.0–0.5)
Eosinophils Relative: 0 %
HCT: 40.1 % (ref 36.0–46.0)
Hemoglobin: 12.8 g/dL (ref 12.0–15.0)
Immature Granulocytes: 1 %
Lymphocytes Relative: 13 %
Lymphs Abs: 1.8 10*3/uL (ref 0.7–4.0)
MCH: 26.4 pg (ref 26.0–34.0)
MCHC: 31.9 g/dL (ref 30.0–36.0)
MCV: 82.7 fL (ref 80.0–100.0)
Monocytes Absolute: 0.7 10*3/uL (ref 0.1–1.0)
Monocytes Relative: 5 %
Neutro Abs: 10.8 10*3/uL — ABNORMAL HIGH (ref 1.7–7.7)
Neutrophils Relative %: 81 %
Platelets: 329 10*3/uL (ref 150–400)
RBC: 4.85 MIL/uL (ref 3.87–5.11)
RDW: 14.8 % (ref 11.5–15.5)
WBC: 13.4 10*3/uL — ABNORMAL HIGH (ref 4.0–10.5)
nRBC: 0 % (ref 0.0–0.2)

## 2019-07-28 MED ORDER — ONDANSETRON 4 MG PO TBDP: 4.0000 mg | ORAL_TABLET | Freq: Three times a day (TID) | ORAL | 0 refills | Status: DC | PRN

## 2019-07-28 MED ORDER — METOCLOPRAMIDE HCL 5 MG/ML IJ SOLN
10.0000 mg | Freq: Once | INTRAMUSCULAR | Status: AC
  Administered 2019-07-28: 11:00:00 10 mg via INTRAVENOUS
  Filled 2019-07-28: qty 2

## 2019-07-28 MED ORDER — ONDANSETRON HCL 4 MG/2ML IJ SOLN
INTRAMUSCULAR | Status: AC
  Filled 2019-07-28: qty 2

## 2019-07-28 MED ORDER — DIPHENHYDRAMINE HCL 50 MG/ML IJ SOLN
12.5000 mg | Freq: Once | INTRAMUSCULAR | Status: AC
  Administered 2019-07-28: 11:00:00 12.5 mg via INTRAVENOUS
  Filled 2019-07-28: qty 1

## 2019-07-28 MED ORDER — PROMETHAZINE HCL 25 MG/ML IJ SOLN
INTRAMUSCULAR | Status: AC
  Administered 2019-07-28: 12.5 mg
  Filled 2019-07-28: qty 1

## 2019-07-28 MED ORDER — ONDANSETRON HCL 4 MG/2ML IJ SOLN
4.0000 mg | Freq: Once | INTRAMUSCULAR | Status: AC
  Administered 2019-07-28: 4 mg via INTRAVENOUS

## 2019-07-28 MED ORDER — SODIUM CHLORIDE 0.9 % IV BOLUS
1000.0000 mL | Freq: Once | INTRAVENOUS | Status: AC
  Administered 2019-07-28: 09:00:00 1000 mL via INTRAVENOUS

## 2019-07-28 NOTE — ED Provider Notes (Signed)
[facility] EMERGENCY DEPARTMENT Provider Note   CSN: [id number] Arrival date & time: 07/28/19  0850     History Chief Complaint  Patient presents with  . Emesis    Christine Trujillo is a 24 y.o. female.  The history is provided by the patient. No language interpreter was used.  Emesis Severity:  Moderate Duration:  1 day Timing:  Constant Number of daily episodes:  Multiple Quality:  Unable to specify Progression:  Worsening Chronicity:  New Recent urination:  Normal Relieved by:  Nothing Worsened by:  Nothing Ineffective treatments:  None tried Associated symptoms: no abdominal pain and no fever   Risk factors: not pregnant and no sick contacts    Pt complains of nausea vomiting and diarrhea.  Pt denies abdominal pain.  Pt denies pregnancy.     Past Medical History:  Diagnosis Date  . Anemia    during pregnancy  . Anxiety   . Depression   . Gallstone   . GERD (gastroesophageal reflux disease)   . Gestational diabetes    glyburide  . Headache   . Migraine   . Obesity   . Paratubal cyst 02/19/2016    Patient Active Problem List   Diagnosis Date Noted  . GAD (generalized anxiety disorder) 04/20/2019  . Dermatitis 05/10/2018  . Closed fracture of tooth 05/10/2018  . Low back pain 07/21/2016  . BMI 45.0-49.9, adult (HCC) 04/22/2016  . Depression, recurrent (HCC) 04/22/2016  . Right ovarian cyst affecting pregnancy in second trimester, antepartum 03/25/2016    Past Surgical History:  Procedure Laterality Date  . CHOLECYSTECTOMY N/A 12/02/2016   Procedure: LAPAROSCOPIC CHOLECYSTECTOMY WITH INTRAOPERATIVE CHOLANGIOGRAM;  Surgeon: Ovidio Kin, MD;  Location: [facility];  Service: General;  Laterality: N/A;  . WISDOM TOOTH EXTRACTION       OB History    Gravida  1   Para  1   Term  1   Preterm      AB      Living  1     SAB      TAB      Ectopic      Multiple  0   Live Births  1           Family History  Problem  Relation Age of Onset  . Diabetes Mother   . Heart disease Mother        CHF    Social History   Tobacco Use  . Smoking status: Current Every Day Smoker    Types: Cigars  . Smokeless tobacco: Never Used  Substance Use Topics  . Alcohol use: No  . Drug use: No    Home Medications Prior to Admission medications   Medication Sig Start Date End Date Taking? Authorizing Provider  FLUoxetine (PROZAC) 20 MG tablet Take 1 tablet (20 mg total) by mouth daily. 04/20/19   Sharlene Dory, DO    Allergies    Patient has no known allergies.  Review of Systems   Review of Systems  Constitutional: Negative for fever.  Gastrointestinal: Positive for vomiting. Negative for abdominal pain.  All other systems reviewed and are negative.   Physical Exam Updated Vital Signs BP 132/86 (BP Location: Right Arm)   Pulse 62   Temp 97.7 F (36.5 C) (Oral)   Resp (!) 22   Ht 5\' 3"  (1.6 m)   Wt 131.5 kg   SpO2 100%   BMI 51.35 kg/m   Physical Exam Vitals and nursing note reviewed.  Constitutional:      Appearance: She is well-developed.  HENT:     Head: Normocephalic.     Mouth/Throat:     Mouth: Mucous membranes are moist.  Cardiovascular:     Rate and Rhythm: Normal rate and regular rhythm.  Pulmonary:     Effort: Pulmonary effort is normal.  Abdominal:     General: There is no distension.  Musculoskeletal:        General: Normal range of motion.     Cervical back: Normal range of motion.  Skin:    General: Skin is warm.  Neurological:     Mental Status: She is alert and oriented to person, place, and time.  Psychiatric:        Mood and Affect: Mood normal.     ED Results / Procedures / Treatments   Labs (all labs ordered are listed, but only abnormal results are displayed) Labs Reviewed  CBC WITH DIFFERENTIAL/PLATELET - Abnormal; Notable for the following components:      Result Value   WBC 13.4 (*)    Neutro Abs 10.8 (*)    All other components within  normal limits  COMPREHENSIVE METABOLIC PANEL - Abnormal; Notable for the following components:   Glucose, Bld 152 (*)    AST 13 (*)    All other components within normal limits  URINALYSIS, ROUTINE W REFLEX MICROSCOPIC  PREGNANCY, URINE    EKG None  Radiology No results found.  Procedures Procedures (including critical care time)  Medications Ordered in ED Medications  metoCLOPramide (REGLAN) injection 10 mg (has no administration in time range)  diphenhydrAMINE (BENADRYL) injection 12.5 mg (has no administration in time range)  sodium chloride 0.9 % bolus 1,000 mL (0 mLs Intravenous Stopped 07/28/19 1124)  ondansetron (ZOFRAN) injection 4 mg (4 mg Intravenous Given 07/28/19 0929)  promethazine (PHENERGAN) 25 MG/ML injection (12.5 mg  Given 07/28/19 1001)    ED Course  I have reviewed the triage vital signs and the nursing notes.  Pertinent labs & imaging results that were available during my care of the patient were reviewed by me and considered in my medical decision making (see chart for details).    MDM Rules/Calculators/A&P                      MDM:  Pt given Iv fluids x 1 liter,  No relief with Iv zofran,  Pt given phenergan.  Pt reports continued nausea.  Pt given reglan and benadryl.  Pt unable to obtain ua, Pt denies pregnancy risk or uti.  Pt does not want to stay for further evaluation.  Final Clinical Impression(s) / ED Diagnoses Final diagnoses:  Vomiting and diarrhea    Rx / DC Orders ED Discharge Orders         Ordered    ondansetron (ZOFRAN ODT) 4 MG disintegrating tablet  Every 8 hours PRN     07/28/19 1131        An After Visit Summary was printed and given to the patient.    Fransico Meadow, PA-C 07/28/19 [place], DO 07/28/19 1402

## 2019-07-28 NOTE — Discharge Instructions (Signed)
Return if any problems.

## 2019-07-28 NOTE — ED Triage Notes (Signed)
Pt here with emesis and diarrhea since 6:30 this morning.

## 2019-08-24 ENCOUNTER — Ambulatory Visit (INDEPENDENT_AMBULATORY_CARE_PROVIDER_SITE_OTHER): Payer: Medicaid Other

## 2019-08-24 ENCOUNTER — Other Ambulatory Visit: Payer: Self-pay

## 2019-08-24 DIAGNOSIS — Z309 Encounter for contraceptive management, unspecified: Secondary | ICD-10-CM

## 2019-08-24 MED ORDER — MEDROXYPROGESTERONE ACETATE 150 MG/ML IM SUSP
150.0000 mg | Freq: Once | INTRAMUSCULAR | Status: AC
  Administered 2019-08-24: 150 mg via INTRAMUSCULAR

## 2019-08-24 NOTE — Progress Notes (Signed)
Patient here today for depo injection. Patient is within window. 1 mL given in left deltoid IM. Per patient. Tolerated well.

## 2019-08-24 NOTE — Progress Notes (Signed)
Noted  Jamel Holzmann R Lowne Chase, DO  

## 2019-10-05 ENCOUNTER — Other Ambulatory Visit: Payer: Self-pay | Admitting: *Deleted

## 2019-10-05 ENCOUNTER — Other Ambulatory Visit: Payer: Self-pay

## 2019-10-05 ENCOUNTER — Ambulatory Visit (HOSPITAL_BASED_OUTPATIENT_CLINIC_OR_DEPARTMENT_OTHER)
Admission: RE | Admit: 2019-10-05 | Discharge: 2019-10-05 | Disposition: A | Discharge status: Home / Self Care | Payer: Medicaid Other | Source: Ambulatory Visit | Attending: Family Medicine | Admitting: Family Medicine

## 2019-10-05 ENCOUNTER — Ambulatory Visit (INDEPENDENT_AMBULATORY_CARE_PROVIDER_SITE_OTHER): Payer: Medicaid Other | Admitting: Family Medicine

## 2019-10-05 VITALS — BP 130/86 | Ht 63.0 in | Wt 290.0 lb

## 2019-10-05 DIAGNOSIS — G8929 Other chronic pain: Secondary | ICD-10-CM

## 2019-10-05 DIAGNOSIS — M5441 Lumbago with sciatica, right side: Secondary | ICD-10-CM

## 2019-10-05 DIAGNOSIS — M778 Other enthesopathies, not elsewhere classified: Secondary | ICD-10-CM | POA: Diagnosis not present

## 2019-10-05 NOTE — Progress Notes (Signed)
Christine Trujillo - 24 y.o. female MRN [id number]  Date of birth: Dec 18, 1995  SUBJECTIVE:  Including CC & ROS.  No chief complaint on file.   Christine Trujillo is a 24 y.o. female that is presenting with acute on chronic low back pain with some sciatic type symptoms on the right leg as well as acute right shoulder pain.  The low back and sciatic type symptoms have been ongoing since 3 years ago.  She has tried several measures with no improvement.  She has tried therapy as well as medications.  The pain is intermittent in nature.  It seems to be worse at night.  She also notices her leg giving way from time to time.  No prior history of surgery.  The right shoulder has been hurting over the past few weeks.  Denies any specific inciting event.  Seems to be globally surrounding the shoulder joint.   Review of Systems See HPI   HISTORY: Past Medical, Surgical, Social, and Family History Reviewed & Updated per EMR.   Pertinent Historical Findings include:  Past Medical History:  Diagnosis Date  . Anemia    during pregnancy  . Anxiety   . Depression   . Gallstone   . GERD (gastroesophageal reflux disease)   . Gestational diabetes    glyburide  . Headache   . Migraine   . Obesity   . Paratubal cyst 02/19/2016    Past Surgical History:  Procedure Laterality Date  . CHOLECYSTECTOMY N/A 12/02/2016   Procedure: LAPAROSCOPIC CHOLECYSTECTOMY WITH INTRAOPERATIVE CHOLANGIOGRAM;  Surgeon: Alphonsa Overall, MD;  Location: [facility];  Service: General;  Laterality: N/A;  . WISDOM TOOTH EXTRACTION      Family History  Problem Relation Age of Onset  . Diabetes Mother   . Heart disease Mother        CHF    Social History   Socioeconomic History  . Marital status: Single    Spouse name: Not on file  . Number of children: Not on file  . Years of education: Not on file  . Highest education level: Not on file  Occupational History  . Not on file  Tobacco Use  . Smoking status: Current Every  Day Smoker    Types: Cigars  . Smokeless tobacco: Never Used  Vaping Use  . Vaping Use: Never used  Substance and Sexual Activity  . Alcohol use: No  . Drug use: No  . Sexual activity: Not on file  Other Topics Concern  . Not on file  Social History Narrative  . Not on file   Social Determinants of Health   Financial Resource Strain:   . Difficulty of Paying Living Expenses:   Food Insecurity:   . Worried About Charity fundraiser in the Last Year:   . Arboriculturist in the Last Year:   Transportation Needs:   . Film/video editor (Medical):   Marland Kitchen Lack of Transportation (Non-Medical):   Physical Activity:   . Days of Exercise per Week:   . Minutes of Exercise per Session:   Stress:   . Feeling of Stress :   Social Connections:   . Frequency of Communication with Friends and Family:   . Frequency of Social Gatherings with Friends and Family:   . Attends Religious Services:   . Active Member of Clubs or Organizations:   . Attends Archivist Meetings:   Marland Kitchen Marital Status:   Intimate Partner Violence:   . Fear of Current  or Ex-Partner:   . Emotionally Abused:   Marland Kitchen Physically Abused:   . Sexually Abused:      PHYSICAL EXAM:  VS: BP 130/86   Ht 5\' 3"  (1.6 m)   Wt 290 lb (131.5 kg)   BMI 51.37 kg/m  Physical Exam Gen: NAD, alert, cooperative with exam, well-appearing MSK:  Back: Normal flexion and extension. Some instability with hip flexion abduction. Negative straight leg raise. +2 deep tendon reflexes at the patella. Right shoulder: Internal and external rotation. Normal strength resistance. Pain with external rotation abduction. Normal he can test. O'Brien's test. Neurovascular intact     ASSESSMENT & PLAN:   Low back pain Acute on chronic in nature.  Has been ongoing for roughly 3 years.  Pain seem to gotten worse as of late. -Counseled on home exercise therapy and supportive care. -X-ray. -MRI to evaluate for any nerve  impingement.  Capsulitis of right shoulder Pain fairly acute in nature.  Seems more capsular related as opposed to rotator cuff. -Counseled on home exercise therapy and supportive care. -Could consider physical therapy or imaging.

## 2019-10-05 NOTE — Patient Instructions (Signed)
Nice to meet you Please try ice for the shoulder and heat for the back  Please try the exercises  Please  I will call with the results from today  Please send me a message in MyChart with any questions or updates.  We will set up a virtual visit after the MRI is resulted. We can see you back in 4 weeks for the shoulder.   --Dr. Jordan Likes

## 2019-10-05 NOTE — Assessment & Plan Note (Signed)
Pain fairly acute in nature.  Seems more capsular related as opposed to rotator cuff. -Counseled on home exercise therapy and supportive care. -Could consider physical therapy or imaging.

## 2019-10-05 NOTE — Assessment & Plan Note (Signed)
Acute on chronic in nature.  Has been ongoing for roughly 3 years.  Pain seem to gotten worse as of late. -Counseled on home exercise therapy and supportive care. -X-ray. -MRI to evaluate for any nerve impingement.

## 2019-10-08 ENCOUNTER — Ambulatory Visit: Payer: Medicaid Other | Admitting: Family Medicine

## 2019-10-08 ENCOUNTER — Telehealth: Payer: Self-pay | Admitting: Family Medicine

## 2019-10-08 NOTE — Telephone Encounter (Signed)
Informed of results.   Myra Rude, MD [facility] 10/08/2019, 8:07 AM

## 2019-10-25 ENCOUNTER — Ambulatory Visit: Payer: Medicaid Other | Admitting: Family Medicine

## 2019-11-06 ENCOUNTER — Ambulatory Visit: Payer: Medicaid Other | Admitting: Family Medicine

## 2019-11-20 ENCOUNTER — Ambulatory Visit (INDEPENDENT_AMBULATORY_CARE_PROVIDER_SITE_OTHER): Payer: Medicaid Other

## 2019-11-20 ENCOUNTER — Other Ambulatory Visit: Payer: Self-pay

## 2019-11-20 DIAGNOSIS — Z309 Encounter for contraceptive management, unspecified: Secondary | ICD-10-CM

## 2019-11-20 MED ORDER — MEDROXYPROGESTERONE ACETATE 150 MG/ML IM SUSP
150.0000 mg | Freq: Once | INTRAMUSCULAR | Status: AC
  Administered 2019-11-20: 150 mg via INTRAMUSCULAR

## 2019-11-20 NOTE — Progress Notes (Addendum)
Patient here today for Depo injection. Patient is within window. 1 mL given in right deltoid IM per her request. Next appointment scheduled.  Reviewed. [name] [place] 12:10 PM 11/20/19

## 2019-11-30 ENCOUNTER — Encounter (HOSPITAL_BASED_OUTPATIENT_CLINIC_OR_DEPARTMENT_OTHER): Payer: Self-pay | Admitting: Emergency Medicine

## 2019-11-30 ENCOUNTER — Emergency Department (HOSPITAL_BASED_OUTPATIENT_CLINIC_OR_DEPARTMENT_OTHER)
Admission: EM | Admit: 2019-11-30 | Discharge: 2019-11-30 | Disposition: A | Discharge status: Home / Self Care | Payer: Medicaid Other | Attending: Emergency Medicine | Admitting: Emergency Medicine

## 2019-11-30 ENCOUNTER — Other Ambulatory Visit: Payer: Self-pay

## 2019-11-30 DIAGNOSIS — Y999 Unspecified external cause status: Secondary | ICD-10-CM | POA: Diagnosis not present

## 2019-11-30 DIAGNOSIS — W458XXA Other foreign body or object entering through skin, initial encounter: Secondary | ICD-10-CM | POA: Diagnosis not present

## 2019-11-30 DIAGNOSIS — T161XXA Foreign body in right ear, initial encounter: Secondary | ICD-10-CM | POA: Insufficient documentation

## 2019-11-30 DIAGNOSIS — Y929 Unspecified place or not applicable: Secondary | ICD-10-CM | POA: Insufficient documentation

## 2019-11-30 DIAGNOSIS — F17290 Nicotine dependence, other tobacco product, uncomplicated: Secondary | ICD-10-CM | POA: Diagnosis not present

## 2019-11-30 DIAGNOSIS — Y939 Activity, unspecified: Secondary | ICD-10-CM | POA: Diagnosis not present

## 2019-11-30 NOTE — ED Triage Notes (Signed)
Pt reports insect in right ear

## 2019-11-30 NOTE — ED Provider Notes (Signed)
[facility] EMERGENCY DEPARTMENT Provider Note   CSN: [id number] Arrival date & time: 11/30/19  0008     History Chief Complaint  Patient presents with  . Foreign Body in Ear    Christine Trujillo is a 24 y.o. female.  Patient presents to the emergency department for evaluation of foreign body in right ear.  Patient reports that she can feel an insect crawling around in the right ear.        Past Medical History:  Diagnosis Date  . Anemia    during pregnancy  . Anxiety   . Depression   . Gallstone   . GERD (gastroesophageal reflux disease)   . Gestational diabetes    glyburide  . Headache   . Migraine   . Obesity   . Paratubal cyst 02/19/2016    Patient Active Problem List   Diagnosis Date Noted  . Capsulitis of right shoulder 10/05/2019  . GAD (generalized anxiety disorder) 04/20/2019  . Dermatitis 05/10/2018  . Closed fracture of tooth 05/10/2018  . Low back pain 07/21/2016  . BMI 45.0-49.9, adult (HCC) 04/22/2016  . Depression, recurrent (HCC) 04/22/2016  . Right ovarian cyst affecting pregnancy in second trimester, antepartum 03/25/2016    Past Surgical History:  Procedure Laterality Date  . CHOLECYSTECTOMY N/A 12/02/2016   Procedure: LAPAROSCOPIC CHOLECYSTECTOMY WITH INTRAOPERATIVE CHOLANGIOGRAM;  Surgeon: Ovidio Kin, MD;  Location: [facility];  Service: General;  Laterality: N/A;  . WISDOM TOOTH EXTRACTION       OB History    Gravida  1   Para  1   Term  1   Preterm      AB      Living  1     SAB      TAB      Ectopic      Multiple  0   Live Births  1           Family History  Problem Relation Age of Onset  . Diabetes Mother   . Heart disease Mother        CHF    Social History   Tobacco Use  . Smoking status: Current Every Day Smoker    Types: Cigars  . Smokeless tobacco: Never Used  Vaping Use  . Vaping Use: Never used  Substance Use Topics  . Alcohol use: No  . Drug use: No    Home  Medications Prior to Admission medications   Medication Sig Start Date End Date Taking? Authorizing Provider  FLUoxetine (PROZAC) 20 MG tablet Take 1 tablet (20 mg total) by mouth daily. 04/20/19   Sharlene Dory, DO  ondansetron (ZOFRAN ODT) 4 MG disintegrating tablet Take 1 tablet (4 mg total) by mouth every 8 (eight) hours as needed for nausea or vomiting. 07/28/19   Elson Areas, PA-C    Allergies    Patient has no known allergies.  Review of Systems   Review of Systems  HENT: Positive for ear pain.   All other systems reviewed and are negative.   Physical Exam Updated Vital Signs BP (!) 137/96 (BP Location: Left Arm)   Pulse 76   Temp 98.2 F (36.8 C) (Oral)   Resp 18   Ht 5\' 3"  (1.6 m)   Wt 122.5 kg   SpO2 98%   BMI 47.83 kg/m   Physical Exam Vitals and nursing note reviewed.  Constitutional:      Appearance: Normal appearance.  HENT:     Head: Atraumatic.  Ears:     Comments: Brown insect in right ear canal, normal tympanic membrane and canal otherwise Skin:    General: Skin is warm and dry.  Neurological:     Mental Status: She is alert and oriented to person, place, and time.  Psychiatric:        Mood and Affect: Mood normal.     ED Results / Procedures / Treatments   Labs (all labs ordered are listed, but only abnormal results are displayed) Labs Reviewed - No data to display  EKG None  Radiology No results found.  Procedures .Foreign Body Removal  Date/Time: 11/30/2019 12:40 AM Performed by: Gilda Crease, MD Authorized by: Gilda Crease, MD  Body area: ear Location details: right ear  Sedation: Patient sedated: no  Patient restrained: no Patient cooperative: yes Localization method: ENT speculum Removal mechanism: alligator forceps Complexity: simple 1 objects recovered. Objects recovered: brown insect Post-procedure assessment: foreign body removed Patient tolerance: patient tolerated the procedure  well with no immediate complications Comments: Very small piece of wing remained in ear, irrigated with saline after removal of insect   (including critical care time)  Medications Ordered in ED Medications - No data to display  ED Course  I have reviewed the triage vital signs and the nursing notes.  Pertinent labs & imaging results that were available during my care of the patient were reviewed by me and considered in my medical decision making (see chart for details).    MDM Rules/Calculators/A&P                          Second right ear canal.  Removed with alligator forceps. Final Clinical Impression(s) / ED Diagnoses Final diagnoses:  Foreign body of right ear, initial encounter    Rx / DC Orders ED Discharge Orders    None       Javad Salva, Canary Brim, MD 11/30/19 [phone]

## 2019-12-07 ENCOUNTER — Encounter: Payer: Self-pay | Admitting: Family Medicine

## 2019-12-07 ENCOUNTER — Other Ambulatory Visit: Payer: Self-pay

## 2019-12-07 ENCOUNTER — Ambulatory Visit (INDEPENDENT_AMBULATORY_CARE_PROVIDER_SITE_OTHER): Payer: Medicaid Other | Admitting: Family Medicine

## 2019-12-07 VITALS — BP 120/76 | HR 100 | Temp 98.9°F | Ht 64.0 in | Wt 273.0 lb

## 2019-12-07 DIAGNOSIS — Z1159 Encounter for screening for other viral diseases: Secondary | ICD-10-CM

## 2019-12-07 DIAGNOSIS — Z0000 Encounter for general adult medical examination without abnormal findings: Secondary | ICD-10-CM | POA: Diagnosis not present

## 2019-12-07 DIAGNOSIS — L309 Dermatitis, unspecified: Secondary | ICD-10-CM

## 2019-12-07 DIAGNOSIS — G8929 Other chronic pain: Secondary | ICD-10-CM

## 2019-12-07 DIAGNOSIS — M545 Low back pain: Secondary | ICD-10-CM

## 2019-12-07 DIAGNOSIS — L83 Acanthosis nigricans: Secondary | ICD-10-CM

## 2019-12-07 MED ORDER — CLOTRIMAZOLE-BETAMETHASONE 1-0.05 % EX CREA: 1.0000 "application " | TOPICAL_CREAM | Freq: Two times a day (BID) | CUTANEOUS | 0 refills | Status: DC

## 2019-12-07 NOTE — Progress Notes (Addendum)
Chief Complaint  Patient presents with  . Annual Exam     Well Woman Christine Trujillo is here for a complete physical.   Her last physical was >1 year ago.  Current diet: in general, a "crappy" diet. Current exercise: active with kid. Contraception? Yes Fatigue out of ordinary? No Seatbelt? Yes  Health Maintenance Pap/HPV- Due Tetanus- Yes HIV screening- Yes Hep C screening- No  Past Medical History:  Diagnosis Date  . Anemia    during pregnancy  . Anxiety   . Depression   . Gallstone   . GERD (gastroesophageal reflux disease)   . Gestational diabetes    glyburide  . Headache   . Migraine   . Obesity   . Paratubal cyst 02/19/2016     Past Surgical History:  Procedure Laterality Date  . CHOLECYSTECTOMY N/A 12/02/2016   Procedure: LAPAROSCOPIC CHOLECYSTECTOMY WITH INTRAOPERATIVE CHOLANGIOGRAM;  Surgeon: Ovidio Kin, MD;  Location: [facility];  Service: General;  Laterality: N/A;  . WISDOM TOOTH EXTRACTION      Medications  Takes no meds routinely.   Allergies No Known Allergies  Review of Systems: Constitutional:  no unexpected weight changes Eye:  no recent significant change in vision Ear/Nose/Mouth/Throat:  Ears:  no tinnitus or vertigo and no recent change in hearing Nose/Mouth/Throat:  no complaints of nasal congestion, no sore throat Cardiovascular: no chest pain Respiratory:  no cough and no shortness of breath Gastrointestinal:  no abdominal pain, no change in bowel habits GU:  Female: negative for dysuria or pelvic pain Musculoskeletal/Extremities:  +chronic back pain Integumentary (Skin/Breast): +black ring around neck, +itchy rash around neck Neurologic:  no headaches Endocrine:  denies fatigue Hematologic/Lymphatic:  No areas of easy bleeding  Exam BP 120/76 (BP Location: Left Arm, Patient Position: Sitting, Cuff Size: Large)   Pulse 100   Temp 98.9 F (37.2 C) (Oral)   Ht 5\' 4"  (1.626 m)   Wt 273 lb (123.8 kg)   SpO2 97%   BMI 46.86  kg/m  General:  well developed, well nourished, in no apparent distress Skin: scaling around base of neck b/l, there is a black band of skin around neck; otherwise no significant moles, warts, or growths Head:  no masses, lesions, or tenderness Eyes:  pupils equal and round, sclera anicteric without injection Ears:  canals without lesions, TMs shiny without retraction, no obvious effusion, no erythema Nose:  nares patent, septum midline, mucosa normal, and no drainage or sinus tenderness Throat/Pharynx:  lips and gingiva without lesion; tongue and uvula midline; non-inflamed pharynx; no exudates or postnasal drainage Neck: neck supple without adenopathy, thyromegaly, or masses Lungs:  clear to auscultation, breath sounds equal bilaterally, no respiratory distress Cardio:  regular rate and rhythm, no bruits, no LE edema Abdomen:  abdomen soft, nontender; bowel sounds normal; no masses or organomegaly Genital: Defer to GYN Musculoskeletal:  +TTP over lumbar and lower thoracic parasp msc b/l; neg straight leg; tight hamstrings Extremities:  no clubbing, cyanosis, or edema, no deformities, no skin discoloration Neuro:  gait normal; deep tendon reflexes normal and symmetric Psych: well oriented with normal range of affect and appropriate judgment/insight  Assessment and Plan  Well adult exam - Plan: CBC, Comprehensive metabolic panel, Hemoglobin A1c, Lipid panel  Encounter for hepatitis C screening test for low risk patient - Plan: Hepatitis C antibody  Chronic midline low back pain without sciatica - Plan: Ambulatory referral to Physical Therapy  Morbid obesity (HCC) - Plan: Amb Referral to Bariatric Surgery  Dermatitis - Plan:  clotrimazole-betamethasone (LOTRISONE) cream  Acanthosis nigricans   Well 24 y.o. female. Counseled on diet and exercise. Other orders as above. Need to find alt contraceptive method other than depo as she has been on it for 3 yrs. OB/GYN info for across the  hall given. She is due for cerv cancer screening also.  I think she has acanthosis nigricans on neck, will ck A1c. She also has some scaling, will tx with Lotrisone to cover for fungus.  Bariatric surg referral for morbid obesity. PT for back.   Follow up in 6 mo. The patient voiced understanding and agreement to the plan.  [name] [place], DO 12/07/19 2:15 PM

## 2019-12-07 NOTE — Patient Instructions (Addendum)
Call Center for [facility] at [phone] for an appointment.  They are located at [address], [place], [place], [address] (right across the hall from our office).  We need to stop the depo shot for a while.   Give [place] 2-3 business days to get the results of your labs back.   Keep the diet clean and stay active.  If you do not hear anything about your referral in the next 1-2 weeks, call our office and ask for an update.   I recommend getting the flu shot in mid October. This suggestion would change if the CDC comes out with a different recommendation.   Let us know if you need anything.  Healthy Eating Plan Many factors influence your heart health, including eating and exercise habits. Heart (coronary) risk increases with abnormal blood fat (lipid) levels. Heart-healthy meal planning includes limiting unhealthy fats, increasing healthy fats, and making other small dietary changes. This includes maintaining a healthy body weight to help keep lipid levels within a normal range.  WHAT IS MY PLAN?  Your health care provider recommends that you:  Drink a glass of water before meals to help with satiety.  Eat slowly.  An alternative to the water is to add Metamucil. This will help with satiety as well. It does contain calories, unlike water.  WHAT TYPES OF FAT SHOULD I CHOOSE?  Choose healthy fats more often. Choose monounsaturated and polyunsaturated fats, such as olive oil and canola oil, flaxseeds, walnuts, almonds, and seeds.  Eat more omega-3 fats. Good choices include salmon, mackerel, sardines, tuna, flaxseed oil, and ground flaxseeds. Aim to eat fish at least two times each week.  Avoid foods with partially hydrogenated oils in them. These contain trans fats. Examples of foods that contain trans fats are stick margarine, some tub margarines, cookies, crackers, and other baked goods. If you are going to avoid a fat, this is the one to  avoid!  WHAT GENERAL GUIDELINES DO I NEED TO FOLLOW?  Check food labels carefully to identify foods with trans fats. Avoid these types of options when possible.  Fill one half of your plate with vegetables and green salads. Eat 4-5 servings of vegetables per day. A serving of vegetables equals 1 cup of raw leafy vegetables,  cup of raw or cooked cut-up vegetables, or  cup of vegetable juice.  Fill one fourth of your plate with whole grains. Look for the word "whole" as the first word in the ingredient list.  Fill one fourth of your plate with lean protein foods.  Eat 4-5 servings of fruit per day. A serving of fruit equals one medium whole fruit,  cup of dried fruit,  cup of fresh, frozen, or canned fruit. Try to avoid fruits in cups/syrups as the sugar content can be high.  Eat more foods that contain soluble fiber. Examples of foods that contain this type of fiber are apples, broccoli, carrots, beans, peas, and barley. Aim to get 20-30 g of fiber per day.  Eat more home-cooked food and less restaurant, buffet, and fast food.  Limit or avoid alcohol.  Limit foods that are high in starch and sugar.  Avoid fried foods when able.  Cook foods by using methods other than frying. Baking, boiling, grilling, and broiling are all great options. Other fat-reducing suggestions include: ? Removing the skin from poultry. ? Removing all visible fats from meats. ? Skimming the fat off of stews, soups, and gravies  before serving them. ? Steaming vegetables in water or broth.  Lose weight if you are overweight. Losing just 5-10% of your initial body weight can help your overall health and prevent diseases such as diabetes and heart disease.  Increase your consumption of nuts, legumes, and seeds to 4-5 servings per week. One serving of dried beans or legumes equals  cup after being cooked, one serving of nuts equals 1 ounces, and one serving of seeds equals  ounce or 1 tablespoon.  WHAT ARE  GOOD FOODS CAN I EAT? Grains Grainy breads (try to find bread that is 3 g of fiber per slice or greater), oatmeal, light popcorn. Whole-grain cereals. Rice and pasta, including brown rice and those that are made with whole wheat. Edamame pasta is a great alternative to grain pasta. It has a higher protein content. Try to avoid significant consumption of white bread, sugary cereals, or pastries/baked goods.  Vegetables All vegetables. Cooked white potatoes do not count as vegetables.  Fruits All fruits, but limit pineapple and bananas as these fruits have a higher sugar content.  Meats and Other Protein Sources Lean, well-trimmed beef, veal, pork, and lamb. Chicken and [place] without skin. All fish and shellfish. Wild duck, rabbit, pheasant, and venison. Egg whites or low-cholesterol egg substitutes. Dried beans, peas, lentils, and tofu.Seeds and most nuts.  Dairy Low-fat or nonfat cheeses, including ricotta, string, and mozzarella. Skim or 1% milk that is liquid, powdered, or evaporated. Buttermilk that is made with low-fat milk. Nonfat or low-fat yogurt. Soy/Almond milk are good alternatives if you cannot handle dairy.  Beverages Water is the best for you. Sports drinks with less sugar are more desirable unless you are a highly active athlete.  Sweets and Desserts Sherbets and fruit ices. Honey, jam, marmalade, jelly, and syrups. Dark chocolate.  Eat all sweets and desserts in moderation.  Fats and Oils Nonhydrogenated (trans-free) margarines. Vegetable oils, including soybean, sesame, sunflower, olive, peanut, safflower, corn, canola, and cottonseed. Salad dressings or mayonnaise that are made with a vegetable oil. Limit added fats and oils that you use for cooking, baking, salads, and as spreads.  Other Cocoa powder. Coffee and tea. Most condiments.  The items listed above may not be a complete list of recommended foods or beverages. Contact your dietitian for more options.

## 2019-12-14 ENCOUNTER — Other Ambulatory Visit (INDEPENDENT_AMBULATORY_CARE_PROVIDER_SITE_OTHER): Payer: Medicaid Other

## 2019-12-14 ENCOUNTER — Other Ambulatory Visit: Payer: Self-pay

## 2019-12-14 DIAGNOSIS — Z1159 Encounter for screening for other viral diseases: Secondary | ICD-10-CM

## 2019-12-14 DIAGNOSIS — Z0000 Encounter for general adult medical examination without abnormal findings: Secondary | ICD-10-CM

## 2019-12-18 ENCOUNTER — Other Ambulatory Visit: Payer: Self-pay | Admitting: Family Medicine

## 2019-12-18 DIAGNOSIS — R82998 Other abnormal findings in urine: Secondary | ICD-10-CM

## 2019-12-18 DIAGNOSIS — E785 Hyperlipidemia, unspecified: Secondary | ICD-10-CM

## 2019-12-18 DIAGNOSIS — D729 Disorder of white blood cells, unspecified: Secondary | ICD-10-CM

## 2019-12-18 LAB — COMPREHENSIVE METABOLIC PANEL
AG Ratio: 1.5 (calc) (ref 1.0–2.5)
ALT: 14 U/L (ref 6–29)
AST: 12 U/L (ref 10–30)
Albumin: 4.3 g/dL (ref 3.6–5.1)
Alkaline phosphatase (APISO): 89 U/L (ref 31–125)
BUN: 8 mg/dL (ref 7–25)
CO2: 18 mmol/L — ABNORMAL LOW (ref 20–32)
Calcium: 9.4 mg/dL (ref 8.6–10.2)
Chloride: 107 mmol/L (ref 98–110)
Creat: 0.79 mg/dL (ref 0.50–1.10)
Globulin: 2.8 g/dL (calc) (ref 1.9–3.7)
Glucose, Bld: 94 mg/dL (ref 65–99)
Potassium: 4.1 mmol/L (ref 3.5–5.3)
Sodium: 139 mmol/L (ref 135–146)
Total Bilirubin: 0.6 mg/dL (ref 0.2–1.2)
Total Protein: 7.1 g/dL (ref 6.1–8.1)

## 2019-12-18 LAB — HEPATITIS C ANTIBODY
Hepatitis C Ab: NONREACTIVE
SIGNAL TO CUT-OFF: 0.2 (ref <1.00)

## 2019-12-18 LAB — CBC
HCT: 38.6 % (ref 35.0–45.0)
Hemoglobin: 12.8 g/dL (ref 11.7–15.5)
MCH: 27.3 pg (ref 27.0–33.0)
MCHC: 33.2 g/dL (ref 32.0–36.0)
MCV: 82.3 fL (ref 80.0–100.0)
MPV: 10.6 fL (ref 7.5–12.5)
Platelets: 350 10*3/uL (ref 140–400)
RBC: 4.69 10*6/uL (ref 3.80–5.10)
RDW: 13.5 % (ref 11.0–15.0)
WBC: 11.7 10*3/uL — ABNORMAL HIGH (ref 3.8–10.8)

## 2019-12-18 LAB — LIPID PANEL
Cholesterol: 187 mg/dL (ref <200)
HDL: 37 mg/dL — ABNORMAL LOW (ref >=50)
LDL Cholesterol (Calc): 136 mg/dL (calc) — ABNORMAL HIGH
Non-HDL Cholesterol (Calc): 150 mg/dL (calc) — ABNORMAL HIGH (ref <130)
Total CHOL/HDL Ratio: 5.1 (calc) — ABNORMAL HIGH (ref <5.0)
Triglycerides: 58 mg/dL (ref <150)

## 2019-12-18 LAB — HEMOGLOBIN A1C
Hgb A1c MFr Bld: 5.2 % of total Hgb (ref <5.7)
Mean Plasma Glucose: 103 (calc)
eAG (mmol/L): 5.7 (calc)

## 2020-01-15 ENCOUNTER — Ambulatory Visit: Payer: Medicaid Other | Admitting: Family Medicine

## 2020-01-23 ENCOUNTER — Other Ambulatory Visit: Payer: Self-pay

## 2020-01-23 ENCOUNTER — Ambulatory Visit (INDEPENDENT_AMBULATORY_CARE_PROVIDER_SITE_OTHER): Payer: Medicaid Other | Admitting: Family Medicine

## 2020-01-23 ENCOUNTER — Other Ambulatory Visit: Payer: Medicaid Other

## 2020-01-23 ENCOUNTER — Encounter: Payer: Self-pay | Admitting: Family Medicine

## 2020-01-23 VITALS — BP 112/72 | HR 54 | Temp 98.7°F | Ht 63.0 in | Wt 268.0 lb

## 2020-01-23 DIAGNOSIS — Z23 Encounter for immunization: Secondary | ICD-10-CM | POA: Diagnosis not present

## 2020-01-23 DIAGNOSIS — F411 Generalized anxiety disorder: Secondary | ICD-10-CM | POA: Diagnosis not present

## 2020-01-23 DIAGNOSIS — E785 Hyperlipidemia, unspecified: Secondary | ICD-10-CM | POA: Diagnosis not present

## 2020-01-23 DIAGNOSIS — D729 Disorder of white blood cells, unspecified: Secondary | ICD-10-CM | POA: Diagnosis not present

## 2020-01-23 DIAGNOSIS — F320 Major depressive disorder, single episode, mild: Secondary | ICD-10-CM | POA: Insufficient documentation

## 2020-01-23 MED ORDER — DULOXETINE HCL 30 MG PO CPEP: 30.0000 mg | ORAL_CAPSULE | Freq: Every day | ORAL | 3 refills | Status: DC

## 2020-01-23 NOTE — Addendum Note (Signed)
Addended by: Mervin Kung A on: 01/23/2020 10:31 AM   Modules accepted: Orders

## 2020-01-23 NOTE — Patient Instructions (Signed)
Please consider counseling. Contact [phone] to schedule an appointment or inquire about cost/insurance coverage.  Aim to do some physical exertion for 150 minutes per week. This is typically divided into 5 days per week, 30 minutes per day. The activity should be enough to get your heart rate up. Anything is better than nothing if you have time constraints.  Coping skills Choose 5 that work for you:  Take a deep breath  Count to 20  Read a book  Do a puzzle  Meditate  Bake  Sing  Knit  Garden  Pray  Go outside  Call a friend  Listen to music  Take a walk  Color  Send a note  Take a bath  Watch a movie  Be alone in a quiet place  Pet an animal  Visit a friend  Journal  Exercise  Stretch    Let us know if you need anything.  

## 2020-01-23 NOTE — Progress Notes (Signed)
Chief Complaint  Patient presents with  . Follow-up    psy medication    Subjective Christine Trujillo is an 24 y.o. female who presents with anxiety/depression Symptoms began again around 1 mo ago. Anxiety symptoms: feelings of losing control, irritable, palpitations, psychomotor agitation. Depressive symptoms depressed mood, anhedonia, feelings of worthlessness/guilt, tearfulness. 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  She is currently being treated with None and has failed Prozac, Effexor, Zoloft. She is not following with a psychologist.  Past Medical History:  Diagnosis Date  . Anemia    during pregnancy  . Anxiety   . Depression   . Gallstone   . GERD (gastroesophageal reflux disease)   . Gestational diabetes    glyburide  . Headache   . Migraine   . Obesity   . Paratubal cyst 02/19/2016     Family History Family History  Problem Relation Age of Onset  . Diabetes Mother   . Heart disease Mother        CHF    Exam BP 112/72 (BP Location: Left Arm, Patient Position: Sitting, Cuff Size: Large)   Pulse (!) 54   Temp 98.7 F (37.1 C) (Oral)   Ht 5\' 3"  (1.6 m)   Wt 268 lb (121.6 kg)   SpO2 100%   BMI 47.47 kg/m  General:  well developed, well nourished, in no apparent distress Lungs:  normal respiratory effort without accessory muscle use Psych: well oriented with normal range of affect and age-appropriate judgement/insight  Assessment and Plan  GAD (generalized anxiety disorder)  Depression, major, single episode, mild (HCC)  Need for influenza vaccination - Plan: Flu Vaccine QUAD 6+ mos PF IM (Fluarix Quad PF)  Hyperlipidemia, unspecified hyperlipidemia type - Plan: Lipid panel  Abnormal WBC count - Plan: CBC w/Diff  Status: Uncontrolled. Start Cymbalta 30 mg/d.  I think she needs to take some more time to herself.  Nonmedicinal stress relievers written down in her paperwork.  Counseled on adjunctive treatment with  exercise/physical activity. Number for [facility] provided in AVS. F/u in 6 weeks. Patient voiced understanding and agreement to the plan.  [facility] [place], DO 01/23/20 11:58 AM

## 2020-01-24 LAB — CBC WITH DIFFERENTIAL/PLATELET
Absolute Monocytes: 589 cells/uL (ref 200–950)
Basophils Absolute: 22 cells/uL (ref 0–200)
Basophils Relative: 0.2 %
Eosinophils Absolute: 87 cells/uL (ref 15–500)
Eosinophils Relative: 0.8 %
HCT: 40.9 % (ref 35.0–45.0)
Hemoglobin: 12.9 g/dL (ref 11.7–15.5)
Lymphs Abs: 2551 cells/uL (ref 850–3900)
MCH: 26.2 pg — ABNORMAL LOW (ref 27.0–33.0)
MCHC: 31.5 g/dL — ABNORMAL LOW (ref 32.0–36.0)
MCV: 83 fL (ref 80.0–100.0)
MPV: 10.2 fL (ref 7.5–12.5)
Monocytes Relative: 5.4 %
Neutro Abs: 7652 cells/uL (ref 1500–7800)
Neutrophils Relative %: 70.2 %
Platelets: 363 10*3/uL (ref 140–400)
RBC: 4.93 10*6/uL (ref 3.80–5.10)
RDW: 13.1 % (ref 11.0–15.0)
Total Lymphocyte: 23.4 %
WBC: 10.9 10*3/uL — ABNORMAL HIGH (ref 3.8–10.8)

## 2020-01-24 LAB — LIPID PANEL
Cholesterol: 182 mg/dL (ref <200)
HDL: 37 mg/dL — ABNORMAL LOW (ref >=50)
LDL Cholesterol (Calc): 134 mg/dL (calc) — ABNORMAL HIGH
Non-HDL Cholesterol (Calc): 145 mg/dL (calc) — ABNORMAL HIGH (ref <130)
Total CHOL/HDL Ratio: 4.9 (calc) (ref <5.0)
Triglycerides: 36 mg/dL (ref <150)

## 2020-02-15 ENCOUNTER — Ambulatory Visit (INDEPENDENT_AMBULATORY_CARE_PROVIDER_SITE_OTHER): Payer: Medicaid Other

## 2020-02-15 ENCOUNTER — Other Ambulatory Visit: Payer: Self-pay

## 2020-02-15 DIAGNOSIS — Z3042 Encounter for surveillance of injectable contraceptive: Secondary | ICD-10-CM | POA: Diagnosis not present

## 2020-02-15 MED ORDER — MEDROXYPROGESTERONE ACETATE 150 MG/ML IM SUSP
150.0000 mg | Freq: Once | INTRAMUSCULAR | Status: AC
  Administered 2020-02-15: 150 mg via INTRAMUSCULAR

## 2020-02-15 NOTE — Progress Notes (Addendum)
Date last pap: Unknown Last Depo-Provera:11/20/19 Side Effects if any: None Serum HCG indicated? Neg. Depo-Provera 150 mg IM given SN:[id number] in left deltoid Next appointment: 05/06/20 at 2:00pm

## 2020-04-03 ENCOUNTER — Other Ambulatory Visit: Payer: Self-pay

## 2020-04-03 ENCOUNTER — Encounter (HOSPITAL_BASED_OUTPATIENT_CLINIC_OR_DEPARTMENT_OTHER): Payer: Self-pay

## 2020-04-03 ENCOUNTER — Emergency Department (HOSPITAL_BASED_OUTPATIENT_CLINIC_OR_DEPARTMENT_OTHER)
Admission: EM | Admit: 2020-04-03 | Discharge: 2020-04-03 | Disposition: A | Discharge status: Home / Self Care | Payer: Medicaid Other | Attending: Emergency Medicine | Admitting: Emergency Medicine

## 2020-04-03 DIAGNOSIS — R112 Nausea with vomiting, unspecified: Secondary | ICD-10-CM | POA: Diagnosis not present

## 2020-04-03 DIAGNOSIS — K219 Gastro-esophageal reflux disease without esophagitis: Secondary | ICD-10-CM | POA: Insufficient documentation

## 2020-04-03 DIAGNOSIS — R109 Unspecified abdominal pain: Secondary | ICD-10-CM | POA: Insufficient documentation

## 2020-04-03 DIAGNOSIS — F17290 Nicotine dependence, other tobacco product, uncomplicated: Secondary | ICD-10-CM | POA: Diagnosis not present

## 2020-04-03 LAB — COMPREHENSIVE METABOLIC PANEL
ALT: 15 U/L (ref 0–44)
AST: 16 U/L (ref 15–41)
Albumin: 4.4 g/dL (ref 3.5–5.0)
Alkaline Phosphatase: 78 U/L (ref 38–126)
Anion gap: 13 (ref 5–15)
BUN: 9 mg/dL (ref 6–20)
CO2: 20 mmol/L — ABNORMAL LOW (ref 22–32)
Calcium: 9.3 mg/dL (ref 8.9–10.3)
Chloride: 103 mmol/L (ref 98–111)
Creatinine, Ser: 0.74 mg/dL (ref 0.44–1.00)
GFR, Estimated: >60 mL/min (ref >60)
Glucose, Bld: 152 mg/dL — ABNORMAL HIGH (ref 70–99)
Potassium: 3.6 mmol/L (ref 3.5–5.1)
Sodium: 136 mmol/L (ref 135–145)
Total Bilirubin: 0.9 mg/dL (ref 0.3–1.2)
Total Protein: 8 g/dL (ref 6.5–8.1)

## 2020-04-03 LAB — CBC
HCT: 39.2 % (ref 36.0–46.0)
Hemoglobin: 13.1 g/dL (ref 12.0–15.0)
MCH: 27.5 pg (ref 26.0–34.0)
MCHC: 33.4 g/dL (ref 30.0–36.0)
MCV: 82.4 fL (ref 80.0–100.0)
Platelets: 346 10*3/uL (ref 150–400)
RBC: 4.76 MIL/uL (ref 3.87–5.11)
RDW: 14.6 % (ref 11.5–15.5)
WBC: 17.9 10*3/uL — ABNORMAL HIGH (ref 4.0–10.5)
nRBC: 0 % (ref 0.0–0.2)

## 2020-04-03 LAB — URINALYSIS, MICROSCOPIC (REFLEX): Squamous Epithelial / HPF: >50 (ref 0–5)

## 2020-04-03 LAB — URINALYSIS, ROUTINE W REFLEX MICROSCOPIC
Bilirubin Urine: NEGATIVE
Glucose, UA: NEGATIVE mg/dL
Leukocytes,Ua: NEGATIVE
Nitrite: NEGATIVE
Protein, ur: NEGATIVE mg/dL
Specific Gravity, Urine: 1.015 (ref 1.005–1.030)
pH: 6.5 (ref 5.0–8.0)

## 2020-04-03 LAB — PREGNANCY, URINE: Preg Test, Ur: NEGATIVE

## 2020-04-03 LAB — LIPASE, BLOOD: Lipase: 21 U/L (ref 11–51)

## 2020-04-03 MED ORDER — ONDANSETRON HCL 4 MG/2ML IJ SOLN
4.0000 mg | Freq: Once | INTRAMUSCULAR | Status: AC | PRN
  Administered 2020-04-03: 14:00:00 4 mg via INTRAVENOUS
  Filled 2020-04-03: qty 2

## 2020-04-03 MED ORDER — PROMETHAZINE HCL 25 MG/ML IJ SOLN: 25.0000 mg | Freq: Once | INTRAMUSCULAR | Status: DC

## 2020-04-03 MED ORDER — METOCLOPRAMIDE HCL 5 MG/ML IJ SOLN
10.0000 mg | Freq: Once | INTRAMUSCULAR | Status: AC
  Administered 2020-04-03: 16:00:00 10 mg via INTRAVENOUS
  Filled 2020-04-03: qty 2

## 2020-04-03 MED ORDER — SODIUM CHLORIDE 0.9 % IV BOLUS
1000.0000 mL | Freq: Once | INTRAVENOUS | Status: AC
  Administered 2020-04-03: 16:00:00 1000 mL via INTRAVENOUS

## 2020-04-03 MED ORDER — CAPSAICIN 0.025 % EX CREA
TOPICAL_CREAM | Freq: Once | CUTANEOUS | Status: AC
  Filled 2020-04-03: qty 60

## 2020-04-03 MED ORDER — DIPHENHYDRAMINE HCL 50 MG/ML IJ SOLN
25.0000 mg | Freq: Once | INTRAMUSCULAR | Status: AC
  Administered 2020-04-03: 16:00:00 25 mg via INTRAVENOUS
  Filled 2020-04-03: qty 1

## 2020-04-03 NOTE — ED Notes (Signed)
Pt reports using marijuana for vomiting, denies using any prior to vomiting episodes. Also reports taking husbands phenergan but states that she vomited it back up.

## 2020-04-03 NOTE — ED Triage Notes (Signed)
pt arrives via [facility] from home with NVD generalized abdominal pain, no period r/t being on depo.   66HR, 24 RR, lungs clear, 136 palp BP, 99% RA, 97.7 temp

## 2020-04-03 NOTE — ED Notes (Signed)
I spoke with lab and lab states urine culture will be ran off urine already in lab

## 2020-04-03 NOTE — ED Notes (Signed)
ED Provider at bedside. 

## 2020-04-03 NOTE — ED Triage Notes (Signed)
Pt reports NVD since 4 am, denies eating anything new or different denies being around anyone with similar symptoms.

## 2020-04-03 NOTE — Discharge Instructions (Addendum)
It is recommended that you discontinue using marijuana as this can be causing your recurrent nausea vomiting symptoms.  It can take multiple months for marijuana to completely clear out of your system. You can use a capsaicin cream, as you were given here today.  You can find this on the shelf at any pharmacy Follow-up with your primary care provider or with GI regarding your visit today. Return to the emergency department if you develop fever, uncontrollable vomiting, or worsening pain.

## 2020-04-03 NOTE — ED Provider Notes (Signed)
[facility] EMERGENCY DEPARTMENT Provider Note   CSN: [id number] Arrival date & time: 04/03/20  1341     History Chief Complaint  Patient presents with  . Emesis    Christine Trujillo is a 24 y.o. female past medical history of generalized anxiety disorder, cholecystectomy, presenting to the emergency department with complaint of nausea and vomiting that began this morning at 4 AM.  She also endorses generalized sharp abdominal pains that come and go.  She treated her symptoms with her husband's Phenergan without relief.  She also states she smoked marijuana today to help her symptoms.  She endorses marijuana use about 3-4 times per week.  States she has never been told of cannabis induced cyclical nausea vomiting.  She states this nausea and vomiting is a recurrent problem for her and "no one has figured out."  She is followed by PCP upstairs.  She had her gallbladder removed in 2018 and has had no complications following this.  No other abdominal surgeries.  No fevers or urinary symptoms, no diarrhea or constipation.     The history is provided by the patient and medical records.       Past Medical History:  Diagnosis Date  . Anemia    during pregnancy  . Anxiety   . Depression   . Gallstone   . GERD (gastroesophageal reflux disease)   . Gestational diabetes    glyburide  . Headache   . Migraine   . Obesity   . Paratubal cyst 02/19/2016    Patient Active Problem List   Diagnosis Date Noted  . Depression, major, single episode, mild (HCC) 01/23/2020  . Capsulitis of right shoulder 10/05/2019  . GAD (generalized anxiety disorder) 04/20/2019  . Dermatitis 05/10/2018  . Closed fracture of tooth 05/10/2018  . Chronic midline low back pain without sciatica 07/21/2016  . BMI 45.0-49.9, adult (HCC) 04/22/2016  . Depression, recurrent (HCC) 04/22/2016  . Right ovarian cyst affecting pregnancy in second trimester, antepartum 03/25/2016    Past Surgical History:   Procedure Laterality Date  . CHOLECYSTECTOMY N/A 12/02/2016   Procedure: LAPAROSCOPIC CHOLECYSTECTOMY WITH INTRAOPERATIVE CHOLANGIOGRAM;  Surgeon: Ovidio Kin, MD;  Location: [facility];  Service: General;  Laterality: N/A;  . WISDOM TOOTH EXTRACTION       OB History    Gravida  1   Para  1   Term  1   Preterm      AB      Living  1     SAB      IAB      Ectopic      Multiple  0   Live Births  1           Family History  Problem Relation Age of Onset  . Diabetes Mother   . Heart disease Mother        CHF    Social History   Tobacco Use  . Smoking status: Current Every Day Smoker    Types: Cigars  . Smokeless tobacco: Never Used  Vaping Use  . Vaping Use: Never used  Substance Use Topics  . Alcohol use: No  . Drug use: No    Home Medications Prior to Admission medications   Medication Sig Start Date End Date Taking? Authorizing Provider  clotrimazole-betamethasone (LOTRISONE) cream Apply 1 application topically 2 (two) times daily. 12/07/19   Sharlene Dory, DO  DULoxetine (CYMBALTA) 30 MG capsule Take 1 capsule (30 mg total) by mouth daily. 01/23/20  Sharlene Dory, DO    Allergies    Patient has no known allergies.  Review of Systems   Review of Systems  Gastrointestinal: Positive for abdominal pain, nausea and vomiting.  All other systems reviewed and are negative.   Physical Exam Updated Vital Signs BP (!) 146/82 (BP Location: Left Arm)   Pulse 67   Temp 97.9 F (36.6 C) (Oral)   Resp (!) 22   Ht 5\' 3"  (1.6 m)   Wt 122.5 kg   SpO2 100%   BMI 47.83 kg/m   Physical Exam Vitals and nursing note reviewed.  Constitutional:      Appearance: She is well-developed and well-nourished. She is not ill-appearing.  HENT:     Head: Normocephalic and atraumatic.  Eyes:     Conjunctiva/sclera: Conjunctivae normal.  Cardiovascular:     Rate and Rhythm: Normal rate. Rhythm irregular.  Pulmonary:     Effort: Pulmonary  effort is normal. No respiratory distress.     Breath sounds: Normal breath sounds.  Abdominal:     General: Bowel sounds are normal.     Palpations: Abdomen is soft.     Tenderness: There is generalized abdominal tenderness. There is no guarding or rebound.  Skin:    General: Skin is warm.  Neurological:     Mental Status: She is alert.  Psychiatric:        Mood and Affect: Mood and affect normal.        Behavior: Behavior normal.     ED Results / Procedures / Treatments   Labs (all labs ordered are listed, but only abnormal results are displayed) Labs Reviewed  COMPREHENSIVE METABOLIC PANEL - Abnormal; Notable for the following components:      Result Value   CO2 20 (*)    Glucose, Bld 152 (*)    All other components within normal limits  CBC - Abnormal; Notable for the following components:   WBC 17.9 (*)    All other components within normal limits  URINALYSIS, ROUTINE W REFLEX MICROSCOPIC - Abnormal; Notable for the following components:   APPearance HAZY (*)    Hgb urine dipstick TRACE (*)    Ketones, ur LARGE (*)    All other components within normal limits  URINALYSIS, MICROSCOPIC (REFLEX) - Abnormal; Notable for the following components:   Bacteria, UA MANY (*)    All other components within normal limits  URINE CULTURE  LIPASE, BLOOD  PREGNANCY, URINE    EKG EKG Interpretation  Date/Time:  Thursday April 03 2020 14:13:12 EST Ventricular Rate:  56 PR Interval:  132 QRS Duration: 90 QT Interval:  502 QTC Calculation: 484 R Axis:   60 Text Interpretation: Sinus bradycardia with sinus arrhythmia Prolonged QT Abnormal ECG No prior ECG for comparison. No STEMI Confirmed by [id number] (Theda Belfast) on 04/03/2020 2:35:51 PM   Radiology No results found.  Procedures Procedures (including critical care time)  Medications Ordered in ED Medications  ondansetron (ZOFRAN) injection 4 mg (4 mg Intravenous Given 04/03/20 1409)  sodium chloride 0.9 % bolus  1,000 mL (0 mLs Intravenous Stopped 04/03/20 1715)  capsaicin (ZOSTRIX) 0.025 % cream ( Topical Given 04/03/20 1612)  metoCLOPramide (REGLAN) injection 10 mg (10 mg Intravenous Given 04/03/20 1610)  diphenhydrAMINE (BENADRYL) injection 25 mg (25 mg Intravenous Given 04/03/20 1610)    ED Course  I have reviewed the triage vital signs and the nursing notes.  Pertinent labs & imaging results that were available during my care of the patient  were reviewed by me and considered in my medical decision making (see chart for details).  Clinical Course as of 04/03/20 1832  Thu Apr 03, 2020  1620 Patient noted to be in irregular rhythm on cardiac monitor, initially unaware patient had EKG done in triage prior to medications ordered and administered. Corrected Qtc per Bazett formula is 425 [JR]  1702 Pt requesting discharge. States she is feeling much better. Will provide rx for capsaicin and GI referral. Strongly recommend  discontinuing marijuana use as this may likely be causing recurring symptoms. [JR]    Clinical Course User Index [JR] Asyah Candler, [place] N, PA-C   MDM Rules/Calculators/A&P                          Patient presenting for nausea and vomiting that began today.  She reports recurrent episodes of this in the past.  She also endorses marijuana use about 3-4 times per week.  On exam she is afebrile with stable vital signs.  Abdomen with diffuse tenderness without peritoneal signs.  She has no urinary symptoms or has bacteriuria on a UA though appears to be contaminated specimen with greater than 50 squamous cells.  Urine pregnant is negative.  Metabolic panel without acute significant change from baseline.  CBC does have leukocytosis, which is likely reactive.  She is treated with fluids, capsaicin, and antiemetics with improvement in symptoms.  She eats she feels better and requests discharge prior to IV fluids being completed.  States the capsaicin cream gave her quite a bit of relief.   Recommend she can use this over-the-counter.  Also strongly is a vomiting syndrome.  Doubt acute surgical intra-abdominal pathology given relief of symptoms.  Recommend symptomatic management and outpatient follow-up.  She is provided referral to GI as needed, and strict return precautions.  Patient verbalized understanding agrees with care plan.  Discussed results, findings, treatment and follow up. Patient advised of return precautions. Patient verbalized understanding and agreed with plan.  Final Clinical Impression(s) / ED Diagnoses Final diagnoses:  Non-intractable vomiting with nausea, unspecified vomiting type    Rx / DC Orders ED Discharge Orders    None       Eriyonna Matsushita, [place] N, PA-C 04/03/20 1835    Milagros Loll, MD 04/07/20 [phone]

## 2020-04-05 LAB — URINE CULTURE

## 2020-04-07 ENCOUNTER — Telehealth: Payer: Self-pay

## 2020-04-07 NOTE — Telephone Encounter (Signed)
Pt the ER WOBS, will you treat sxs based on her labs? Does she need an OV?

## 2020-04-07 NOTE — Telephone Encounter (Signed)
Nurse Assessment Nurse: Freida Busman, RN, Diane Date/Time Lamount Cohen Time): 04/04/2020 7:57:25 AM Confirm and document reason for call. If symptomatic, describe symptoms. ---Caller states she was seen in the ER for a UTI and BV. ER did not give a script. Pt. is having vaginal discharge - white. Does the patient have any new or worsening symptoms? ---Yes Will a triage be completed? ---No Select reason for no triage. ---Patient declined Disp. Time Lamount Cohen Time) Disposition Final User 04/04/2020 7:59:30 AM Clinical Call Yes Freida Busman, RN, Diane Comments User: Matt Holmes, RN Date/Time Lamount Cohen Time): 04/04/2020 7:59:59 AM Pt. will call the office on Monday for scripts or go back to ER. Did not want triage

## 2020-04-07 NOTE — Telephone Encounter (Signed)
Appt scheduled 04/09/2020.

## 2020-04-07 NOTE — Telephone Encounter (Signed)
Sched appt plz. Ty. 

## 2020-04-08 NOTE — Progress Notes (Deleted)
[facility] [address] [place], [place] [address] [phone]  Date:  04/09/2020   Name:  Christine Trujillo   DOB:  22-May-1995   MRN:  [id number]  PCP:  Sharlene Dory, DO    Chief Complaint: No chief complaint on file.   History of Present Illness:  Christine Trujillo is a 24 y.o. very pleasant female patient who presents with the following:  Patient of my partner Dr. Carmelia Roller, here today with concern of possible UTI I have seen her once in the past, in 2019 She was seen in the med center ER on December 23 with nausea and vomiting She went back to the ER on December 24 at [facility] looks like she left before finishing triage  On December 23 her urine did look dirty, urine culture grew mixed bacteria It does not appear that she was treated with antibiotics Patient Active Problem List   Diagnosis Date Noted  . Depression, major, single episode, mild (HCC) 01/23/2020  . Capsulitis of right shoulder 10/05/2019  . GAD (generalized anxiety disorder) 04/20/2019  . Dermatitis 05/10/2018  . Closed fracture of tooth 05/10/2018  . Chronic midline low back pain without sciatica 07/21/2016  . BMI 45.0-49.9, adult (HCC) 04/22/2016  . Depression, recurrent (HCC) 04/22/2016  . Right ovarian cyst affecting pregnancy in second trimester, antepartum 03/25/2016    Past Medical History:  Diagnosis Date  . Anemia    during pregnancy  . Anxiety   . Depression   . Gallstone   . GERD (gastroesophageal reflux disease)   . Gestational diabetes    glyburide  . Headache   . Migraine   . Obesity   . Paratubal cyst 02/19/2016    Past Surgical History:  Procedure Laterality Date  . CHOLECYSTECTOMY N/A 12/02/2016   Procedure: LAPAROSCOPIC CHOLECYSTECTOMY WITH INTRAOPERATIVE CHOLANGIOGRAM;  Surgeon: Ovidio Kin, MD;  Location: [facility];  Service: General;  Laterality: N/A;  . WISDOM TOOTH EXTRACTION      Social  History   Tobacco Use  . Smoking status: Current Every Day Smoker    Types: Cigars  . Smokeless tobacco: Never Used  Vaping Use  . Vaping Use: Never used  Substance Use Topics  . Alcohol use: No  . Drug use: No    Family History  Problem Relation Age of Onset  . Diabetes Mother   . Heart disease Mother        CHF    No Known Allergies  Medication list has been reviewed and updated.  Current Outpatient Medications on File Prior to Visit  Medication Sig Dispense Refill  . clotrimazole-betamethasone (LOTRISONE) cream Apply 1 application topically 2 (two) times daily. 30 g 0  . DULoxetine (CYMBALTA) 30 MG capsule Take 1 capsule (30 mg total) by mouth daily. 30 capsule 3   No current facility-administered medications on file prior to visit.    Review of Systems:  As per HPI- otherwise negative.   Physical Examination: There were no vitals filed for this visit. There were no vitals filed for this visit. There is no height or weight on file to calculate BMI. Ideal Body Weight:    GEN: no acute distress. HEENT: Atraumatic, Normocephalic.  Ears and Nose: No external deformity. CV: RRR, No M/G/R. No JVD. No thrill. No extra heart sounds. PULM: CTA B, no wheezes, crackles, rhonchi. No retractions. No resp. distress. No accessory muscle use. ABD: S, NT, ND, +BS. No rebound.  No HSM. EXTR: No c/c/e PSYCH: Normally interactive. Conversant.    Assessment and Plan: *** This visit occurred during the SARS-CoV-2 public health emergency.  Safety protocols were in place, including screening questions prior to the visit, additional usage of staff PPE, and extensive cleaning of exam room while observing appropriate contact time as indicated for disinfecting solutions.    Signed Abbe Amsterdam, MD

## 2020-04-09 ENCOUNTER — Ambulatory Visit: Payer: Medicaid Other | Admitting: Family Medicine

## 2020-04-14 ENCOUNTER — Ambulatory Visit: Payer: Medicaid Other | Admitting: Family Medicine

## 2020-04-15 ENCOUNTER — Ambulatory Visit: Payer: Medicaid Other | Admitting: Family Medicine

## 2020-05-06 ENCOUNTER — Other Ambulatory Visit: Payer: Self-pay

## 2020-05-06 ENCOUNTER — Ambulatory Visit (INDEPENDENT_AMBULATORY_CARE_PROVIDER_SITE_OTHER): Payer: Medicaid Other

## 2020-05-06 DIAGNOSIS — Z309 Encounter for contraceptive management, unspecified: Secondary | ICD-10-CM | POA: Diagnosis not present

## 2020-05-06 MED ORDER — MEDROXYPROGESTERONE ACETATE 150 MG/ML IM SUSP
150.0000 mg | Freq: Once | INTRAMUSCULAR | Status: AC
  Administered 2020-05-06: 150 mg via INTRAMUSCULAR

## 2020-05-06 NOTE — Progress Notes (Deleted)
Date last pap: Unknown Last Depo-Provera: 02/15/2020 Side Effects if any:  Serum HCG indicated? no Depo-Provera 150 mg IM given by: Mertha Finders Next appointment: Window: April 12 through May 10   Christine Trujillo, Christine Trujillo

## 2020-05-06 NOTE — Progress Notes (Signed)
Patient here today for dep injection. She is within her window 05/02/2020-05/16/2020. She prefers depo to be given in her deltoid muscle. 1 mL depo given in left deltoid IM. Patient tolerated well.   Next injection due 4/12-4/26. Appointment scheduled for 4/19 at 10:00.

## 2020-07-29 ENCOUNTER — Ambulatory Visit (INDEPENDENT_AMBULATORY_CARE_PROVIDER_SITE_OTHER): Payer: Medicaid Other

## 2020-07-29 ENCOUNTER — Other Ambulatory Visit: Payer: Self-pay

## 2020-07-29 DIAGNOSIS — Z309 Encounter for contraceptive management, unspecified: Secondary | ICD-10-CM | POA: Diagnosis not present

## 2020-07-29 MED ORDER — MEDROXYPROGESTERONE ACETATE 150 MG/ML IM SUSP
150.0000 mg | Freq: Once | INTRAMUSCULAR | Status: AC
Start: 2020-07-29 — End: 2020-07-29
  Administered 2020-07-29: 150 mg via INTRAMUSCULAR

## 2020-07-29 NOTE — Progress Notes (Signed)
Patient here for depo injection. 57mL given in left deltoid per patient requests to be given in arm. Patient toleraed well. Next injection due July 5-19th window. Appt has been scheduled.

## 2020-08-15 ENCOUNTER — Encounter: Payer: Self-pay | Admitting: Gastroenterology

## 2020-09-18 ENCOUNTER — Encounter: Payer: Self-pay | Admitting: Gastroenterology

## 2020-09-18 ENCOUNTER — Ambulatory Visit (INDEPENDENT_AMBULATORY_CARE_PROVIDER_SITE_OTHER): Payer: Medicaid Other | Admitting: Gastroenterology

## 2020-09-18 ENCOUNTER — Other Ambulatory Visit (INDEPENDENT_AMBULATORY_CARE_PROVIDER_SITE_OTHER): Payer: Medicaid Other

## 2020-09-18 ENCOUNTER — Other Ambulatory Visit: Payer: Self-pay

## 2020-09-18 VITALS — BP 140/80 | HR 82 | Ht 63.0 in | Wt 278.2 lb

## 2020-09-18 DIAGNOSIS — R197 Diarrhea, unspecified: Secondary | ICD-10-CM

## 2020-09-18 DIAGNOSIS — R112 Nausea with vomiting, unspecified: Secondary | ICD-10-CM

## 2020-09-18 DIAGNOSIS — R194 Change in bowel habit: Secondary | ICD-10-CM

## 2020-09-18 DIAGNOSIS — K921 Melena: Secondary | ICD-10-CM | POA: Diagnosis not present

## 2020-09-18 DIAGNOSIS — R109 Unspecified abdominal pain: Secondary | ICD-10-CM

## 2020-09-18 LAB — CBC WITH DIFFERENTIAL/PLATELET
Basophils Absolute: 0 10*3/uL (ref 0.0–0.1)
Basophils Relative: 0.4 % (ref 0.0–3.0)
Eosinophils Absolute: 0.1 10*3/uL (ref 0.0–0.7)
Eosinophils Relative: 0.8 % (ref 0.0–5.0)
HCT: 39.6 % (ref 36.0–46.0)
Hemoglobin: 12.6 g/dL (ref 12.0–15.0)
Lymphocytes Relative: 19.9 % (ref 12.0–46.0)
Lymphs Abs: 2.6 10*3/uL (ref 0.7–4.0)
MCHC: 31.9 g/dL (ref 30.0–36.0)
MCV: 83.3 fl (ref 78.0–100.0)
Monocytes Absolute: 1.1 10*3/uL — ABNORMAL HIGH (ref 0.1–1.0)
Monocytes Relative: 8.2 % (ref 3.0–12.0)
Neutro Abs: 9.3 10*3/uL — ABNORMAL HIGH (ref 1.4–7.7)
Neutrophils Relative %: 70.7 % (ref 43.0–77.0)
Platelets: 324 10*3/uL (ref 150.0–400.0)
RBC: 4.75 Mil/uL (ref 3.87–5.11)
RDW: 14.9 % (ref 11.5–15.5)
WBC: 13.1 10*3/uL — ABNORMAL HIGH (ref 4.0–10.5)

## 2020-09-18 LAB — COMPREHENSIVE METABOLIC PANEL
ALT: 12 U/L (ref 0–35)
AST: 10 U/L (ref 0–37)
Albumin: 4.2 g/dL (ref 3.5–5.2)
Alkaline Phosphatase: 82 U/L (ref 39–117)
BUN: 11 mg/dL (ref 6–23)
CO2: 22 mEq/L (ref 19–32)
Calcium: 9.1 mg/dL (ref 8.4–10.5)
Chloride: 107 mEq/L (ref 96–112)
Creatinine, Ser: 0.86 mg/dL (ref 0.40–1.20)
GFR: 93.55 mL/min (ref >60.00)
Glucose, Bld: 83 mg/dL (ref 70–99)
Potassium: 3.9 mEq/L (ref 3.5–5.1)
Sodium: 140 mEq/L (ref 135–145)
Total Bilirubin: 0.6 mg/dL (ref 0.2–1.2)
Total Protein: 6.9 g/dL (ref 6.0–8.3)

## 2020-09-18 LAB — C-REACTIVE PROTEIN: CRP: 1 mg/dL (ref 0.5–20.0)

## 2020-09-18 LAB — SEDIMENTATION RATE: Sed Rate: 58 mm/hr — ABNORMAL HIGH (ref 0–20)

## 2020-09-18 LAB — LIPASE: Lipase: 25 U/L (ref 11.0–59.0)

## 2020-09-18 MED ORDER — CLENPIQ 10-3.5-12 MG-GM -GM/160ML PO SOLN: 1.0000 | ORAL | 0 refills | Status: DC

## 2020-09-18 NOTE — Patient Instructions (Signed)
If you are age 26 or older, your body mass index should be between 23-30. Your Body mass index is 49.29 kg/m. If this is out of the aforementioned range listed, please consider follow up with your Primary Care Provider.  If you are age 16 or younger, your body mass index should be between 19-25. Your Body mass index is 49.29 kg/m. If this is out of the aformentioned range listed, please consider follow up with your Primary Care Provider.   Please go to the 2nd floor of this building today and schedule your labwork, [facility], [address].  We will call you with your appointment for your endoscopy and colonoscopy at [facility].  Due to recent changes in healthcare laws, you may see the results of your imaging and laboratory studies on MyChart before your provider has had a chance to review them.  We understand that in some cases there may be results that are confusing or concerning to you. Not all laboratory results come back in the same time frame and the provider may be waiting for multiple results in order to interpret others.  Please give [place] 48 hours in order for your provider to thoroughly review all the results before contacting the office for clarification of your results.   Thank you for choosing me and [facility].  Vito Cirigliano, D.O.

## 2020-09-18 NOTE — Progress Notes (Signed)
Chief Complaint: Nausea/vomiting, decreased appetite, melena, abdominal pain   Referring Provider:     Shelda Pal, DO   HPI:    Christine Trujillo is a 25 y.o. female referred to the Gastroenterology Clinic for evaluation of nausea/vomiting and abdominal pain.  Sxs started a approximately 4 years ago as hot fashes then n/v/d x3-4 days, occurring 2-3 times/month. No change in weight. Only eats 1 meal/day (only dinner) due to sxs and decreased appetite. No hematochezia, but has had black stools intemrittently. No hematemesis/coffee ground emesis. +abdominal pain with sxs.  Aside from some OTC supplements, not taking any medications at this time.  More recently with change in bowel habits as well.  Hx of ccy 11/2016.   Brother diagnosed with stomach cancer at age 96.   No prior EGD or colonoscopy.  Past Medical History:  Diagnosis Date   Anemia    during pregnancy   Anxiety    Depression    Gallstone    GERD (gastroesophageal reflux disease)    Gestational diabetes    glyburide   Headache    Migraine    Obesity    Paratubal cyst 02/19/2016     Past Surgical History:  Procedure Laterality Date   CHOLECYSTECTOMY N/A 12/02/2016   Procedure: LAPAROSCOPIC CHOLECYSTECTOMY WITH INTRAOPERATIVE CHOLANGIOGRAM;  Surgeon: Alphonsa Overall, MD;  Location: [facility];  Service: General;  Laterality: N/A;   WISDOM TOOTH EXTRACTION     Family History  Problem Relation Age of Onset   Diabetes Mother    Heart disease Mother        CHF   Stomach cancer Brother    Colon cancer Neg Hx    Pancreatic cancer Neg Hx    Esophageal cancer Neg Hx    Liver disease Neg Hx    Social History   Tobacco Use   Smoking status: Every Day    Pack years: 0.00    Types: Cigars   Smokeless tobacco: Never  Vaping Use   Vaping Use: Never used  Substance Use Topics   Alcohol use: No   Drug use: No   No current outpatient medications on file.   No current facility-administered  medications for this visit.   No Known Allergies   Review of Systems: All systems reviewed and negative except where noted in HPI.     Physical Exam:    Wt Readings from Last 3 Encounters:  09/18/20 278 lb 4 oz (126.2 kg)  04/03/20 270 lb (122.5 kg)  01/23/20 268 lb (121.6 kg)    BP 140/80   Pulse 82   Ht 5' 3"  (1.6 m)   Wt 278 lb 4 oz (126.2 kg)   SpO2 97%   BMI 49.29 kg/m  Constitutional:  Pleasant, in no acute distress. Psychiatric: Normal mood and affect. Behavior is normal. EENT: Pupils normal.  Conjunctivae are normal. No scleral icterus. Neck supple. No cervical LAD. Cardiovascular: Normal rate, regular rhythm. No edema Pulmonary/chest: Effort normal and breath sounds normal. No wheezing, rales or rhonchi. Abdominal: Mild generalized TTP without rebound or guarding.  Soft, nondistended. Bowel sounds active throughout. There are no masses palpable. No hepatomegaly. Neurological: Alert and oriented to person place and time. Skin: Skin is warm and dry. No rashes noted.   ASSESSMENT AND PLAN;   1) Nausea/Vomiting 2) Melena 3) Upper abdominal pain 4) Family history of gastric cancer 5) Decreased appetite 6) Change in bowel habits - EGD/colonoscopy  to evaluate for mucosal/luminal pathology - GI PCR panel - Fecal calprotectin - ESR/CRP - CBC - CMP, lipase, H. Pylori Ag  7) History of leukocytosis - Elevated WBC (17.9) during ER evaluation 03/2020.  No repeat since then - Repeat CBC as above  8) Morbid obesity - Procedures to be scheduled at [facility] due to elevated periprocedural risks  The indications, risks, and benefits of EGD and colonoscopy were explained to the patient in detail. Risks include but are not limited to bleeding, perforation, adverse reaction to medications, and cardiopulmonary compromise. Sequelae include but are not limited to the possibility of surgery, hospitalization, and mortality. The patient verbalized understanding and wished to  proceed. All questions answered, referred to scheduler and bowel prep ordered. Further recommendations pending results of the exam.    Christine Bullion, DO, FACG  09/18/2020, 11:44 AM   Nani Ravens, [place] Oyster*

## 2020-09-18 NOTE — Addendum Note (Signed)
Addended by: Rosita Kea on: 09/18/2020 01:10 PM   Modules accepted: Orders

## 2020-10-16 ENCOUNTER — Other Ambulatory Visit: Payer: Self-pay

## 2020-10-20 ENCOUNTER — Ambulatory Visit: Payer: Self-pay | Admitting: *Deleted

## 2020-10-20 NOTE — Telephone Encounter (Signed)
Reason for Disposition  [1] Caller requesting NON-URGENT health information AND [2] PCP's office is the best resource  Answer Assessment - Initial Assessment Questions 1. REASON FOR CALL or QUESTION: "What is your reason for calling today?" or "How can I best help you?" or "What question do you have that I can help answer?"     Patient would like to know if there is anything else she can try to keep solution down for her  GI procedure on Wednesday.  Protocols used: Information Only Call - No Triage-A-AH

## 2020-10-20 NOTE — Telephone Encounter (Signed)
Pt has a GI procedure in am and wants to know what to do if she keeps throwing up solution she has to drink. [phone]   Patient called to review questions. Patient reports she is not tolerating dulcolax she took at 3 p and "threw it up" and now is unable to tolerate miralax she has mixed with gatorade. Patient reports her GI procedure is scheduled for Wednesday. Encouraged patient to try miralax mixed in gatorade in smaller doses closer together than drinking a larger amount at once. Instructed patient to contact GI office in am and notify MD she is unable to tolerate pre procedure medication. Care advise given. Patient verbalized understanding of care advise and to call back if needed.

## 2020-10-21 ENCOUNTER — Other Ambulatory Visit: Payer: Self-pay

## 2020-10-21 ENCOUNTER — Ambulatory Visit (INDEPENDENT_AMBULATORY_CARE_PROVIDER_SITE_OTHER): Payer: Medicaid Other

## 2020-10-21 DIAGNOSIS — Z309 Encounter for contraceptive management, unspecified: Secondary | ICD-10-CM | POA: Diagnosis not present

## 2020-10-21 MED ORDER — MEDROXYPROGESTERONE ACETATE 150 MG/ML IM SUSP
150.0000 mg | Freq: Once | INTRAMUSCULAR | Status: AC
  Administered 2020-10-21: 150 mg via INTRAMUSCULAR

## 2020-10-21 NOTE — Anesthesia Preprocedure Evaluation (Addendum)
Anesthesia Evaluation  Patient identified by MRN, date of birth, ID band Patient awake    Reviewed: Allergy & Precautions, NPO status , Patient's Chart, lab work & pertinent test results  Airway Mallampati: II  TM Distance: >3 FB Neck ROM: Full    Dental no notable dental hx. (+) Teeth Intact, Dental Advisory Given   Pulmonary Current Smoker and Patient abstained from smoking.,    Pulmonary exam normal breath sounds clear to auscultation       Cardiovascular negative cardio ROS Normal cardiovascular exam Rhythm:Regular Rate:Normal     Neuro/Psych  Headaches,    GI/Hepatic Neg liver ROS, GERD  ,  Endo/Other  diabetesMorbid obesity  Renal/GU negative Renal ROS     Musculoskeletal negative musculoskeletal ROS (+)   Abdominal (+) + obese,   Peds  Hematology   Anesthesia Other Findings   Reproductive/Obstetrics                            Anesthesia Physical Anesthesia Plan  ASA: 3  Anesthesia Plan: MAC   Post-op Pain Management:    Induction: Intravenous  PONV Risk Score and Plan: Treatment may vary due to age or medical condition  Airway Management Planned: Natural Airway  Additional Equipment: None  Intra-op Plan:   Post-operative Plan:   Informed Consent: I have reviewed the patients History and Physical, chart, labs and discussed the procedure including the risks, benefits and alternatives for the proposed anesthesia with the patient or authorized representative who has indicated his/her understanding and acceptance.     Dental advisory given  Plan Discussed with:   Anesthesia Plan Comments: (N/V Melena Diarrhea EGD colon )      Anesthesia Quick Evaluation

## 2020-10-21 NOTE — Progress Notes (Signed)
Christine Trujillo is a 25 y.o. female presents to the office today for depo provera, per physician's orders.  Depo provera (med), 150 mg (dose),  IM (route) was administered left deltoid per patient request (location) today. Patient tolerated injection.  Patient next injection due: 9/27-10/11, appt made Yes  [name]

## 2020-10-22 ENCOUNTER — Encounter (HOSPITAL_COMMUNITY)
Admission: RE | Disposition: A | Discharge status: Home / Self Care | Payer: Self-pay | Source: Home / Self Care | Attending: Gastroenterology

## 2020-10-22 ENCOUNTER — Encounter (HOSPITAL_COMMUNITY): Payer: Self-pay | Admitting: Gastroenterology

## 2020-10-22 ENCOUNTER — Ambulatory Visit (HOSPITAL_COMMUNITY): Payer: Medicaid Other | Admitting: Anesthesiology

## 2020-10-22 ENCOUNTER — Ambulatory Visit (HOSPITAL_COMMUNITY)
Admission: RE | Admit: 2020-10-22 | Discharge: 2020-10-22 | Disposition: A | Discharge status: Home / Self Care | Payer: Medicaid Other | Attending: Gastroenterology | Admitting: Gastroenterology

## 2020-10-22 ENCOUNTER — Other Ambulatory Visit: Payer: Self-pay

## 2020-10-22 DIAGNOSIS — D72829 Elevated white blood cell count, unspecified: Secondary | ICD-10-CM | POA: Insufficient documentation

## 2020-10-22 DIAGNOSIS — Z800 Family history of malignant neoplasm of digestive organs: Secondary | ICD-10-CM | POA: Diagnosis not present

## 2020-10-22 DIAGNOSIS — K921 Melena: Secondary | ICD-10-CM | POA: Diagnosis not present

## 2020-10-22 DIAGNOSIS — F17290 Nicotine dependence, other tobacco product, uncomplicated: Secondary | ICD-10-CM | POA: Insufficient documentation

## 2020-10-22 DIAGNOSIS — Z6841 Body Mass Index (BMI) 40.0 and over, adult: Secondary | ICD-10-CM | POA: Diagnosis not present

## 2020-10-22 DIAGNOSIS — R1010 Upper abdominal pain, unspecified: Secondary | ICD-10-CM | POA: Insufficient documentation

## 2020-10-22 DIAGNOSIS — K2970 Gastritis, unspecified, without bleeding: Secondary | ICD-10-CM | POA: Diagnosis not present

## 2020-10-22 DIAGNOSIS — R112 Nausea with vomiting, unspecified: Secondary | ICD-10-CM | POA: Diagnosis not present

## 2020-10-22 DIAGNOSIS — R194 Change in bowel habit: Secondary | ICD-10-CM

## 2020-10-22 DIAGNOSIS — R1110 Vomiting, unspecified: Secondary | ICD-10-CM

## 2020-10-22 DIAGNOSIS — E669 Obesity, unspecified: Secondary | ICD-10-CM | POA: Insufficient documentation

## 2020-10-22 DIAGNOSIS — K3189 Other diseases of stomach and duodenum: Secondary | ICD-10-CM | POA: Insufficient documentation

## 2020-10-22 DIAGNOSIS — R197 Diarrhea, unspecified: Secondary | ICD-10-CM | POA: Diagnosis not present

## 2020-10-22 HISTORY — PX: ESOPHAGOGASTRODUODENOSCOPY (EGD) WITH PROPOFOL: SHX5813

## 2020-10-22 HISTORY — PX: BIOPSY: SHX5522

## 2020-10-22 HISTORY — PX: COLONOSCOPY WITH PROPOFOL: SHX5780

## 2020-10-22 LAB — PREGNANCY, URINE: Preg Test, Ur: NEGATIVE

## 2020-10-22 SURGERY — COLONOSCOPY WITH PROPOFOL
Anesthesia: Monitor Anesthesia Care

## 2020-10-22 MED ORDER — DEXMEDETOMIDINE (PRECEDEX) IN NS 20 MCG/5ML (4 MCG/ML) IV SYRINGE
PREFILLED_SYRINGE | INTRAVENOUS | Status: AC
  Filled 2020-10-22: qty 5

## 2020-10-22 MED ORDER — PROPOFOL 10 MG/ML IV BOLUS
INTRAVENOUS | Status: DC | PRN
  Administered 2020-10-22: 20 mg via INTRAVENOUS
  Administered 2020-10-22: 10 mg via INTRAVENOUS

## 2020-10-22 MED ORDER — DEXMEDETOMIDINE (PRECEDEX) IN NS 20 MCG/5ML (4 MCG/ML) IV SYRINGE
PREFILLED_SYRINGE | INTRAVENOUS | Status: DC | PRN
  Administered 2020-10-22: 8 ug via INTRAVENOUS

## 2020-10-22 MED ORDER — LIDOCAINE 2% (20 MG/ML) 5 ML SYRINGE
INTRAMUSCULAR | Status: DC | PRN
  Administered 2020-10-22: 80 mg via INTRAVENOUS

## 2020-10-22 MED ORDER — OMEPRAZOLE MAGNESIUM 20 MG PO TBEC: 20.0000 mg | DELAYED_RELEASE_TABLET | Freq: Two times a day (BID) | ORAL | 1 refills | Status: DC

## 2020-10-22 MED ORDER — LACTATED RINGERS IV SOLN: INTRAVENOUS | Status: DC

## 2020-10-22 MED ORDER — PROPOFOL 500 MG/50ML IV EMUL
INTRAVENOUS | Status: DC | PRN
  Administered 2020-10-22: 130 ug/kg/min via INTRAVENOUS

## 2020-10-22 SURGICAL SUPPLY — 24 items
BLOCK BITE 60FR ADLT L/F BLUE (MISCELLANEOUS) ×3 IMPLANT
ELECT REM PT RETURN 9FT ADLT (ELECTROSURGICAL)
ELECTRODE REM PT RTRN 9FT ADLT (ELECTROSURGICAL) IMPLANT
FCP BXJMBJMB 240X2.8X (CUTTING FORCEPS)
FLOOR PAD 36X40 (MISCELLANEOUS) ×3
FORCEP RJ3 GP 1.8X160 W-NEEDLE (CUTTING FORCEPS) IMPLANT
FORCEPS BIOP RAD 4 LRG CAP 4 (CUTTING FORCEPS) IMPLANT
FORCEPS BIOP RJ4 240 W/NDL (CUTTING FORCEPS)
FORCEPS BXJMBJMB 240X2.8X (CUTTING FORCEPS) IMPLANT
INJECTOR/SNARE I SNARE (MISCELLANEOUS) IMPLANT
LUBRICANT JELLY 4.5OZ STERILE (MISCELLANEOUS) IMPLANT
MANIFOLD NEPTUNE II (INSTRUMENTS) IMPLANT
NEEDLE SCLEROTHERAPY 25GX240 (NEEDLE) IMPLANT
PAD FLOOR 36X40 (MISCELLANEOUS) ×2 IMPLANT
PROBE APC STR FIRE (PROBE) IMPLANT
PROBE INJECTION GOLD (MISCELLANEOUS)
PROBE INJECTION GOLD 7FR (MISCELLANEOUS) IMPLANT
SNARE ROTATE MED OVAL 20MM (MISCELLANEOUS) IMPLANT
SNARE SHORT THROW 13M SML OVAL (MISCELLANEOUS) IMPLANT
SYR 50ML LL SCALE MARK (SYRINGE) IMPLANT
TRAP SPECIMEN MUCOUS 40CC (MISCELLANEOUS) IMPLANT
TUBING ENDO SMARTCAP PENTAX (MISCELLANEOUS) ×6 IMPLANT
TUBING IRRIGATION ENDOGATOR (MISCELLANEOUS) ×6 IMPLANT
WATER STERILE IRR 1000ML POUR (IV SOLUTION) IMPLANT

## 2020-10-22 NOTE — Op Note (Signed)
[facility] Patient Name: Christine Trujillo Procedure Date: 10/22/2020 MRN: [id number] Attending MD: Gerrit Heck , MD Date of Birth: 1995/11/17 CSN: [id number] Age: 25 Admit Type: Outpatient Procedure:                Upper GI endoscopy Indications:              Upper abdominal pain, Diarrhea/Change in bowel                            habits, Nausea with vomiting, Family history of                            gastric cancer (brother, age 34), Decreased appetite                           Recent evaluation with elevated ESR (58), with                            otherwise normal CMP, CRP, lipase. WBC was 13.1,                            but downtrending from prior. Providers:                Gerrit Heck, MD, Kary Kos RN, RN, Terrall Laity, Elspeth Cho Tech., Technician, Carleene Cooper, CRNA Referring MD:              Medicines:                Monitored Anesthesia Care Complications:            No immediate complications. Estimated Blood Loss:     Estimated blood loss was minimal. Procedure:                Pre-Anesthesia Assessment:                           - Prior to the procedure, a History and Physical                            was performed, and patient medications and                            allergies were reviewed. The patient's tolerance of                            previous anesthesia was also reviewed. The risks                            and benefits of the procedure and the sedation                            options  and risks were discussed with the patient.                            All questions were answered, and informed consent                            was obtained. Prior Anticoagulants: The patient has                            taken no previous anticoagulant or antiplatelet                            agents. ASA Grade Assessment: III - A patient with                             severe systemic disease. After reviewing the risks                            and benefits, the patient was deemed in                            satisfactory condition to undergo the procedure.                           After obtaining informed consent, the endoscope was                            passed under direct vision. Throughout the                            procedure, the patient's blood pressure, pulse, and                            oxygen saturations were monitored continuously. The                            GIF-H190 ([id number]) Olympus gastroscope was                            introduced through the mouth, and advanced to the                            second part of duodenum. The upper GI endoscopy was                            accomplished without difficulty. The patient                            tolerated the procedure well. Scope In: Scope Out: Findings:      The examined esophagus was normal.      Scattered mild inflammation characterized by erythema was found in the       gastric body and in the gastric antrum. Biopsies were taken with a cold  forceps for Helicobacter pylori testing. Estimated blood loss was       minimal.      The examined duodenum was normal. Biopsies were taken with a cold       forceps for histology. Estimated blood loss was minimal. Impression:               - Normal esophagus.                           - Gastritis. Biopsied.                           - Normal examined duodenum. Biopsied. Moderate Sedation:      Not Applicable - Patient had care per Anesthesia. Recommendation:           - Patient has a contact number available for                            emergencies. The signs and symptoms of potential                            delayed complications were discussed with the                            patient. Return to normal activities tomorrow.                            Written discharge instructions were provided to the                             patient.                           - Resume previous diet.                           - Continue present medications.                           - Await pathology results.                           - Use Prilosec (omeprazole) 20 mg PO BID for 6                            weeks, then reduce to 20 mg daily then titrate off.                           - Perform a colonoscopy today. Procedure Code(s):        --- Professional ---                           [phone], Esophagogastroduodenoscopy, flexible,                            transoral; with biopsy, single or multiple Diagnosis Code(s):        --- Professional ---  K29.70, Gastritis, unspecified, without bleeding                           R10.10, Upper abdominal pain, unspecified                           R19.7, Diarrhea, unspecified                           R11.2, Nausea with vomiting, unspecified                           Z80.0, Family history of malignant neoplasm of                            digestive organs CPT copyright 2019 [employer]. All rights reserved. The codes documented in this report are preliminary and upon coder review may  be revised to meet current compliance requirements. Gerrit Heck, MD 10/22/2020 10:17:30 AM Number of Addenda: 0

## 2020-10-22 NOTE — H&P (Signed)
Chief Complaint: Nausea/vomiting, upper abdominal pain, change in bowel habits, melena, leukocytosis   HPI:     Christine Trujillo is a 25 y.o. female presenting to [facility] for EGD/colonoscopy today.  She was initially seen by me in the GI clinic on 09/18/2020, with clinical history as outlined below:  Sxs started a approximately 4 years ago as hot fashes then n/v/d x3-4 days, occurring 2-3 times/month. No change in weight. Only eats 1 meal/day (only dinner) due to sxs and decreased appetite. No hematochezia, but has had black stools intemrittently. No hematemesis/coffee ground emesis. +abdominal pain with sxs.  Aside from some OTC supplements, not taking any medications at this time.  More recently with change in bowel habits as well.   Hx of ccy 11/2016.   Brother diagnosed with stomach cancer at age 18.    No prior EGD or colonoscopy.  Lab work-up notable for WBC 13.1 (although improved from 17) with otherwise normal CBC, elevated ESR 58, with otherwise normal CRP, lipase, CMP.  Stool studies ordered, but not completed by the patient.   Past Medical History:  Diagnosis Date   Anemia    during pregnancy   Anxiety    Depression    Gallstone    GERD (gastroesophageal reflux disease)    Gestational diabetes    glyburide   Headache    Migraine    Obesity    Paratubal cyst 02/19/2016     Past Surgical History:  Procedure Laterality Date   CHOLECYSTECTOMY N/A 12/02/2016   Procedure: LAPAROSCOPIC CHOLECYSTECTOMY WITH INTRAOPERATIVE CHOLANGIOGRAM;  Surgeon: Alphonsa Overall, MD;  Location: [facility];  Service: General;  Laterality: N/A;   WISDOM TOOTH EXTRACTION     Family History  Problem Relation Age of Onset   Diabetes Mother    Heart disease Mother        CHF   Stomach cancer Brother    Colon cancer Neg Hx    Pancreatic cancer Neg Hx    Esophageal cancer Neg Hx    Liver disease Neg Hx    Social History   Tobacco Use   Smoking status: Every  Day    Pack years: 0.00    Types: Cigars   Smokeless tobacco: Never  Vaping Use   Vaping Use: Never used  Substance Use Topics   Alcohol use: No   Drug use: No   Current Facility-Administered Medications  Medication Dose Route Frequency Provider Last Rate Last Admin   lactated ringers infusion   Intravenous Continuous Lanah Steines V, DO 20 mL/hr at 10/22/20 0814 New Bag at 10/22/20 0814   No Known Allergies   Review of Systems: All systems reviewed and negative except where noted in HPI.     Physical Exam:    Wt Readings from Last 3 Encounters:  10/16/20 122.5 kg  09/18/20 126.2 kg  04/03/20 122.5 kg    BP (!) 144/77   Pulse 66   Temp 97.9 F (36.6 C) (Temporal)   Resp 18   Ht _0  (1.6 m)   Wt 122.5 kg   SpO2 100%   BMI 47.83 kg/m  Constitutional:  Pleasant, in no acute distress. Psychiatric: Normal mood and affect. Behavior is normal. EENT: Pupils normal.  Conjunctivae are normal. No scleral icterus. Neck supple. No cervical LAD. Cardiovascular: Normal rate, regular rhythm. No edema Pulmonary/chest: Effort normal and breath sounds normal. No wheezing, rales or rhonchi. Abdominal: Soft, nondistended, nontender. Bowel  sounds active throughout. There are no masses palpable. No hepatomegaly. Neurological: Alert and oriented to person place and time. Skin: Skin is warm and dry. No rashes noted.   ASSESSMENT AND PLAN;   1) Nausea/Vomiting 2) Melena 3) Upper abdominal pain 4) Family history of gastric cancer (brother) 5) Decreased appetite 6) Change in bowel habits -EGD and colonoscopy with random directed biopsies today - To complete stool studies  7) History of leukocytosis - To follow-up with [facility] for further evaluation  8) Obesity - Procedures to be completed at [facility] due to elevated periprocedural risks  Lavena Bullion, DO, FACG  10/22/2020, 8:31 AM   No ref. provider found

## 2020-10-22 NOTE — Transfer of Care (Signed)
Immediate Anesthesia Transfer of Care Note  Patient: Christine Trujillo  Procedure(s) Performed: COLONOSCOPY WITH PROPOFOL BIOPSY ESOPHAGOGASTRODUODENOSCOPY (EGD) WITH PROPOFOL  Patient Location: PACU and Endoscopy Unit  Anesthesia Type:MAC  Level of Consciousness: awake, alert , oriented and patient cooperative  Airway & Oxygen Therapy: Patient Spontanous Breathing and Patient connected to face mask oxygen  Post-op Assessment: Report given to RN and Post -op Vital signs reviewed and stable  Post vital signs: Reviewed and stable  Last Vitals:  Vitals Value Taken Time  BP 128/85 10/22/20 1020  Temp    Pulse 87 10/22/20 1020  Resp 27 10/22/20 1020  SpO2 100 % 10/22/20 1020  Vitals shown include unvalidated device data.  Last Pain:  Vitals:   10/22/20 0812  TempSrc: Temporal  PainSc: 0-No pain         Complications: No notable events documented.

## 2020-10-22 NOTE — Interval H&P Note (Signed)
History and Physical Interval Note:  10/22/2020 8:35 AM  Christine Trujillo  has presented today for surgery, with the diagnosis of obesity, nausea/vomiting, melena, diarrhea, abdominal pain.  The various methods of treatment have been discussed with the patient and family. After consideration of risks, benefits and other options for treatment, the patient has consented to  Procedure(s): COLONOSCOPY WITH PROPOFOL (N/A) ESOPHAGOGASTRODUODENOSCOPY (EGD) WITH PROPOFOL (N/A) as a surgical intervention.  The patient's history has been reviewed, patient examined, no change in status, stable for surgery.  I have reviewed the patient's chart and labs.  Questions were answered to the patient's satisfaction.     Verlin Dike Christine Trujillo

## 2020-10-22 NOTE — Discharge Instructions (Signed)
YOU HAD AN ENDOSCOPIC PROCEDURE TODAY: Refer to the procedure report and other information in the discharge instructions given to you for any specific questions about what was found during the examination. If this information does not answer your questions, please call [place] office at [phone] to clarify.  ° °YOU SHOULD EXPECT: Some feelings of bloating in the abdomen. Passage of more gas than usual. Walking can help get rid of the air that was put into your GI tract during the procedure and reduce the bloating. If you had a lower endoscopy (such as a colonoscopy or flexible sigmoidoscopy) you may notice spotting of blood in your stool or on the toilet paper. Some abdominal soreness may be present for a day or two, also. ° °DIET: Your first meal following the procedure should be a light meal and then it is ok to progress to your normal diet. A half-sandwich or bowl of soup is an example of a good first meal. Heavy or fried foods are harder to digest and may make you feel nauseous or bloated. Drink plenty of fluids but you should avoid alcoholic beverages for 24 hours. If you had a esophageal dilation, please see attached instructions for diet.   ° °ACTIVITY: Your care partner should take you home directly after the procedure. You should plan to take it easy, moving slowly for the rest of the day. You can resume normal activity the day after the procedure however YOU SHOULD NOT DRIVE, use power tools, machinery or perform tasks that involve climbing or major physical exertion for 24 hours (because of the sedation medicines used during the test).  ° °SYMPTOMS TO REPORT IMMEDIATELY: °A gastroenterologist can be reached at any hour. Please call [phone]  for any of the following symptoms:  °Following lower endoscopy (colonoscopy, flexible sigmoidoscopy) °Excessive amounts of blood in the stool  °Significant tenderness, worsening of abdominal pains  °Swelling of the abdomen that is new, acute  °Fever of 100° or  higher  °Following upper endoscopy (EGD, EUS, ERCP, esophageal dilation) °Vomiting of blood or coffee ground material  °New, significant abdominal pain  °New, significant chest pain or pain under the shoulder blades  °Painful or persistently difficult swallowing  °New shortness of breath  °Black, tarry-looking or red, bloody stools ° °FOLLOW UP:  °If any biopsies were taken you will be contacted by phone or by letter within the next 1-3 weeks. Call [phone]  if you have not heard about the biopsies in 3 weeks.  °Please also call with any specific questions about appointments or follow up tests. ° °

## 2020-10-22 NOTE — Anesthesia Postprocedure Evaluation (Signed)
Anesthesia Post Note  Patient: Teva Bronkema  Procedure(s) Performed: COLONOSCOPY WITH PROPOFOL BIOPSY ESOPHAGOGASTRODUODENOSCOPY (EGD) WITH PROPOFOL     Patient location during evaluation: PACU Anesthesia Type: MAC Level of consciousness: awake and alert Pain management: pain level controlled Vital Signs Assessment: post-procedure vital signs reviewed and stable Respiratory status: spontaneous breathing, nonlabored ventilation, respiratory function stable and patient connected to nasal cannula oxygen Cardiovascular status: stable and blood pressure returned to baseline Postop Assessment: no apparent nausea or vomiting Anesthetic complications: no   No notable events documented.  Last Vitals:  Vitals:   10/22/20 1018 10/22/20 1020  BP: 128/83 128/85  Pulse: 94 93  Resp: (!) 26 (!) 29  Temp:    SpO2: 100% 100%    Last Pain:  Vitals:   10/22/20 1018  TempSrc:   PainSc: 0-No pain                 Barnet Glasgow

## 2020-10-22 NOTE — Op Note (Signed)
[facility] Patient Name: Christine BastosCheyenne Slattery Procedure Date: 10/22/2020 MRN: [id number] Attending MD: Doristine LocksVito Kashish Yglesias , MD Date of Birth: 05/16/1995 CSN: [id number] Age: [age] Admit Type: Outpatient Procedure:                Colonoscopy Indications:              This is the patient's first colonoscopy, Upper                            abdominal pain, Change in bowel habits, Diarrhea Providers:                Doristine LocksVito Amelda Hapke, MD, Estella HuskJarmila Fucs RN, RN, Susann GivensEdwina                            Hickman, Arlee Muslimhris Chandler Tech., Technician, Lauree ChandlerLacey A.                            Armistead, CRNA Referring MD:              Medicines:                Monitored Anesthesia Care Complications:            No immediate complications. Estimated Blood Loss:     Estimated blood loss was minimal. Procedure:                Pre-Anesthesia Assessment:                           - Prior to the procedure, a History and Physical                            was performed, and patient medications and                            allergies were reviewed. The patient's tolerance of                            previous anesthesia was also reviewed. The risks                            and benefits of the procedure and the sedation                            options and risks were discussed with the patient.                            All questions were answered, and informed consent                            was obtained. Prior Anticoagulants: The patient has                            taken no previous anticoagulant or antiplatelet  agents. ASA Grade Assessment: III - A patient with                            severe systemic disease. After reviewing the risks                            and benefits, the patient was deemed in                            satisfactory condition to undergo the procedure.                           After obtaining informed consent, the colonoscope                             was passed under direct vision. Throughout the                            procedure, the patient's blood pressure, pulse, and                            oxygen saturations were monitored continuously. The                            [id number]-HQ190L ([id number]) Olympus colonoscope was                            introduced through the anus and advanced to the the                            cecum, identified by appendiceal orifice and                            ileocecal valve. The colonoscopy was performed                            without difficulty. The patient tolerated the                            procedure well. The quality of the bowel                            preparation was good. The ileocecal valve,                            appendiceal orifice, and rectum were photographed. Scope In: 9:56:12 AM Scope Out: 10:10:54 AM Scope Withdrawal Time: 0 hours 13 minutes 14 seconds  Total Procedure Duration: 0 hours 14 minutes 42 seconds  Findings:      The perianal and digital rectal examinations were normal.      The colon appeared normal throughout. No areas of mucosal erythema,       edema, ulceration, or erosions. Biopsies for histology were taken with a       cold forceps from the right colon and left colon  for evaluation of       microscopic colitis. Estimated blood loss was minimal.      The retroflexed view of the distal rectum and anal verge was normal and       showed no anal or rectal abnormalities. Impression:               - The entire examined colon is normal. Biopsied.                           - The distal rectum and anal verge are normal on                            retroflexion view. Moderate Sedation:      Not Applicable - Patient had care per Anesthesia. Recommendation:           - Patient has a contact number available for                            emergencies. The signs and symptoms of potential                            delayed complications were discussed  with the                            patient. Return to normal activities tomorrow.                            Written discharge instructions were provided to the                            patient.                           - Resume previous diet.                           - Continue present medications.                           - Await pathology results.                           - Repeat colonoscopy at age 52 for screening                            purposes.                           - Return to GI office PRN.                           - Use fiber, for example Citrucel, Fibercon, Konsyl                            or Metamucil.                           -  If biopsies unrevealing and symptoms persist,                            will trial course fo cholestyramine. Procedure Code(s):        --- Professional ---                           [phone], Colonoscopy, flexible; with biopsy, single                            or multiple Diagnosis Code(s):        --- Professional ---                           R10.10, Upper abdominal pain, unspecified                           R19.4, Change in bowel habit                           R19.7, Diarrhea, unspecified CPT copyright 2019 [employer]. All rights reserved. The codes documented in this report are preliminary and upon coder review may  be revised to meet current compliance requirements. Doristine Locks, MD 10/22/2020 10:21:59 AM Number of Addenda: 0

## 2020-10-23 LAB — SURGICAL PATHOLOGY

## 2020-10-28 ENCOUNTER — Encounter: Payer: Self-pay | Admitting: Gastroenterology

## 2020-12-02 ENCOUNTER — Other Ambulatory Visit: Payer: Self-pay

## 2020-12-02 ENCOUNTER — Emergency Department (HOSPITAL_BASED_OUTPATIENT_CLINIC_OR_DEPARTMENT_OTHER)
Admission: EM | Admit: 2020-12-02 | Discharge: 2020-12-02 | Disposition: A | Discharge status: Home / Self Care | Payer: Medicaid Other | Attending: Emergency Medicine | Admitting: Emergency Medicine

## 2020-12-02 ENCOUNTER — Emergency Department (HOSPITAL_BASED_OUTPATIENT_CLINIC_OR_DEPARTMENT_OTHER): Payer: Medicaid Other

## 2020-12-02 ENCOUNTER — Encounter (HOSPITAL_BASED_OUTPATIENT_CLINIC_OR_DEPARTMENT_OTHER): Payer: Self-pay

## 2020-12-02 DIAGNOSIS — R112 Nausea with vomiting, unspecified: Secondary | ICD-10-CM | POA: Insufficient documentation

## 2020-12-02 DIAGNOSIS — N9489 Other specified conditions associated with female genital organs and menstrual cycle: Secondary | ICD-10-CM | POA: Diagnosis not present

## 2020-12-02 DIAGNOSIS — F17210 Nicotine dependence, cigarettes, uncomplicated: Secondary | ICD-10-CM | POA: Diagnosis not present

## 2020-12-02 DIAGNOSIS — K219 Gastro-esophageal reflux disease without esophagitis: Secondary | ICD-10-CM | POA: Insufficient documentation

## 2020-12-02 DIAGNOSIS — R109 Unspecified abdominal pain: Secondary | ICD-10-CM | POA: Diagnosis present

## 2020-12-02 DIAGNOSIS — N839 Noninflammatory disorder of ovary, fallopian tube and broad ligament, unspecified: Secondary | ICD-10-CM | POA: Insufficient documentation

## 2020-12-02 DIAGNOSIS — N838 Other noninflammatory disorders of ovary, fallopian tube and broad ligament: Secondary | ICD-10-CM

## 2020-12-02 DIAGNOSIS — N83202 Unspecified ovarian cyst, left side: Secondary | ICD-10-CM

## 2020-12-02 LAB — COMPREHENSIVE METABOLIC PANEL
ALT: 21 U/L (ref 0–44)
AST: 20 U/L (ref 15–41)
Albumin: 4.5 g/dL (ref 3.5–5.0)
Alkaline Phosphatase: 73 U/L (ref 38–126)
Anion gap: 13 (ref 5–15)
BUN: 15 mg/dL (ref 6–20)
CO2: 20 mmol/L — ABNORMAL LOW (ref 22–32)
Calcium: 9.2 mg/dL (ref 8.9–10.3)
Chloride: 101 mmol/L (ref 98–111)
Creatinine, Ser: 0.87 mg/dL (ref 0.44–1.00)
GFR, Estimated: >60 mL/min (ref >60)
Glucose, Bld: 104 mg/dL — ABNORMAL HIGH (ref 70–99)
Potassium: 3.1 mmol/L — ABNORMAL LOW (ref 3.5–5.1)
Sodium: 134 mmol/L — ABNORMAL LOW (ref 135–145)
Total Bilirubin: 1.1 mg/dL (ref 0.3–1.2)
Total Protein: 8 g/dL (ref 6.5–8.1)

## 2020-12-02 LAB — URINALYSIS, MICROSCOPIC (REFLEX)

## 2020-12-02 LAB — CBC WITH DIFFERENTIAL/PLATELET
Abs Immature Granulocytes: 0.08 10*3/uL — ABNORMAL HIGH (ref 0.00–0.07)
Basophils Absolute: 0 10*3/uL (ref 0.0–0.1)
Basophils Relative: 0 %
Eosinophils Absolute: 0 10*3/uL (ref 0.0–0.5)
Eosinophils Relative: 0 %
HCT: 40.8 % (ref 36.0–46.0)
Hemoglobin: 13.9 g/dL (ref 12.0–15.0)
Immature Granulocytes: 1 %
Lymphocytes Relative: 14 %
Lymphs Abs: 2.1 10*3/uL (ref 0.7–4.0)
MCH: 27.4 pg (ref 26.0–34.0)
MCHC: 34.1 g/dL (ref 30.0–36.0)
MCV: 80.5 fL (ref 80.0–100.0)
Monocytes Absolute: 1 10*3/uL (ref 0.1–1.0)
Monocytes Relative: 7 %
Neutro Abs: 11.6 10*3/uL — ABNORMAL HIGH (ref 1.7–7.7)
Neutrophils Relative %: 78 %
Platelets: 347 10*3/uL (ref 150–400)
RBC: 5.07 MIL/uL (ref 3.87–5.11)
RDW: 13.7 % (ref 11.5–15.5)
WBC: 14.8 10*3/uL — ABNORMAL HIGH (ref 4.0–10.5)
nRBC: 0 % (ref 0.0–0.2)

## 2020-12-02 LAB — URINALYSIS, ROUTINE W REFLEX MICROSCOPIC
Bilirubin Urine: NEGATIVE
Glucose, UA: NEGATIVE mg/dL
Hgb urine dipstick: NEGATIVE
Ketones, ur: 80 mg/dL — AB
Nitrite: NEGATIVE
Protein, ur: NEGATIVE mg/dL
Specific Gravity, Urine: 1.015 (ref 1.005–1.030)
pH: 6.5 (ref 5.0–8.0)

## 2020-12-02 LAB — LIPASE, BLOOD: Lipase: 26 U/L (ref 11–51)

## 2020-12-02 LAB — HCG, SERUM, QUALITATIVE: Preg, Serum: NEGATIVE

## 2020-12-02 MED ORDER — PROMETHAZINE HCL 25 MG PO TABS: 25.0000 mg | ORAL_TABLET | Freq: Four times a day (QID) | ORAL | 0 refills | Status: DC | PRN

## 2020-12-02 MED ORDER — IOHEXOL 300 MG/ML  SOLN
75.0000 mL | Freq: Once | INTRAMUSCULAR | Status: AC | PRN
  Administered 2020-12-02: 75 mL via INTRAVENOUS

## 2020-12-02 MED ORDER — POTASSIUM CHLORIDE CRYS ER 20 MEQ PO TBCR
20.0000 meq | EXTENDED_RELEASE_TABLET | Freq: Once | ORAL | Status: AC
  Administered 2020-12-02: 20 meq via ORAL
  Filled 2020-12-02: qty 1

## 2020-12-02 MED ORDER — METOCLOPRAMIDE HCL 5 MG/ML IJ SOLN
10.0000 mg | Freq: Once | INTRAMUSCULAR | Status: AC
  Administered 2020-12-02: 10 mg via INTRAVENOUS
  Filled 2020-12-02: qty 2

## 2020-12-02 MED ORDER — SODIUM CHLORIDE 0.9 % IV BOLUS
1000.0000 mL | Freq: Once | INTRAVENOUS | Status: AC
  Administered 2020-12-02: 1000 mL via INTRAVENOUS

## 2020-12-02 MED ORDER — POTASSIUM CHLORIDE ER 10 MEQ PO TBCR: 20.0000 meq | EXTENDED_RELEASE_TABLET | Freq: Two times a day (BID) | ORAL | 0 refills | Status: DC

## 2020-12-02 MED ORDER — ONDANSETRON HCL 4 MG/2ML IJ SOLN
4.0000 mg | Freq: Once | INTRAMUSCULAR | Status: AC
  Administered 2020-12-02: 4 mg via INTRAVENOUS
  Filled 2020-12-02: qty 2

## 2020-12-02 MED ORDER — MORPHINE SULFATE (PF) 4 MG/ML IV SOLN
4.0000 mg | Freq: Once | INTRAVENOUS | Status: AC
  Administered 2020-12-02: 4 mg via INTRAVENOUS
  Filled 2020-12-02: qty 1

## 2020-12-02 NOTE — ED Provider Notes (Signed)
[facility] EMERGENCY DEPARTMENT Provider Note   CSN: [id number] Arrival date & time: 12/02/20  1548     History Chief Complaint  Patient presents with   Emesis    Christine Trujillo is a 25 y.o. female with a past medical history of generalized anxiety disorder, GERD and cholecystectomy 2018 who presents with a complaint of abdominal pain and vomiting.  She says that 3 days ago she began to have these pains with associated NV and occasional diarrhea.  She is unable to localize her abdominal pain.  Reports being at [facility] earlier today however left due to long wait times.  Reports trying to take her acid reflux medication however this did not help.  Denies the possibility of pregnancy.  No hematemesis, melena, hematochezia.  No urinary symptoms.  Endorses marijuana use "2 blunts daily."     Past Medical History:  Diagnosis Date   Anemia    during pregnancy   Anxiety    Depression    Gallstone    GERD (gastroesophageal reflux disease)    Gestational diabetes    glyburide   Headache    Migraine    Obesity    Paratubal cyst 02/19/2016    Patient Active Problem List   Diagnosis Date Noted   Pain of upper abdomen    Non-intractable vomiting    Family history- stomach cancer    Diarrhea    Change in bowel habits    Depression, major, single episode, mild (HCC) 01/23/2020   Capsulitis of right shoulder 10/05/2019   GAD (generalized anxiety disorder) 04/20/2019   Dermatitis 05/10/2018   Closed fracture of tooth 05/10/2018   Chronic midline low back pain without sciatica 07/21/2016   BMI 45.0-49.9, adult (HCC) 04/22/2016   Depression, recurrent (HCC) 04/22/2016   Right ovarian cyst affecting pregnancy in second trimester, antepartum 03/25/2016    Past Surgical History:  Procedure Laterality Date   BIOPSY  10/22/2020   Procedure: BIOPSY;  Surgeon: Shellia Cleverlyirigliano, Vito V, DO;  Location: [facility];  Service: Gastroenterology;;   CHOLECYSTECTOMY N/A  12/02/2016   Procedure: LAPAROSCOPIC CHOLECYSTECTOMY WITH INTRAOPERATIVE CHOLANGIOGRAM;  Surgeon: Ovidio KinNewman, David, MD;  Location: [facility];  Service: General;  Laterality: N/A;   COLONOSCOPY WITH PROPOFOL N/A 10/22/2020   Procedure: COLONOSCOPY WITH PROPOFOL;  Surgeon: Shellia Cleverlyirigliano, Vito V, DO;  Location: [facility];  Service: Gastroenterology;  Laterality: N/A;   ESOPHAGOGASTRODUODENOSCOPY (EGD) WITH PROPOFOL N/A 10/22/2020   Procedure: ESOPHAGOGASTRODUODENOSCOPY (EGD) WITH PROPOFOL;  Surgeon: Shellia Cleverlyirigliano, Vito V, DO;  Location: [facility];  Service: Gastroenterology;  Laterality: N/A;   ESOPHAGOGASTRODUODENOSCOPY (EGD) WITH PROPOFOL N/A 10/22/2020   Procedure: ESOPHAGOGASTRODUODENOSCOPY (EGD) WITH PROPOFOL;  Surgeon: Shellia Cleverlyirigliano, Vito V, DO;  Location: [facility];  Service: Gastroenterology;  Laterality: N/A;   WISDOM TOOTH EXTRACTION       OB History     Gravida  1   Para  1   Term  1   Preterm      AB      Living  1      SAB      IAB      Ectopic      Multiple  0   Live Births  1           Family History  Problem Relation Age of Onset   Diabetes Mother    Heart disease Mother        CHF   Stomach cancer Brother    Colon cancer Neg Hx  Pancreatic cancer Neg Hx    Esophageal cancer Neg Hx    Liver disease Neg Hx     Social History   Tobacco Use   Smoking status: Every Day    Types: Cigarettes   Smokeless tobacco: Never  Vaping Use   Vaping Use: Never used  Substance Use Topics   Alcohol use: No   Drug use: Yes    Types: Marijuana    Home Medications Prior to Admission medications   Medication Sig Start Date End Date Taking? Authorizing Provider  Biotin w/ Vitamins C & E (HAIR/SKIN/NAILS PO) Take 1 tablet by mouth daily.    [provider]  Multiple Vitamins-Minerals (MULTIVITAMIN WITH MINERALS) tablet Take 1 tablet by mouth daily.    [provider]  omeprazole (PRILOSEC OTC) 20 MG tablet Take 1 tablet (20 mg total) by mouth 2  (two) times daily. 10/22/20 10/22/21  Cirigliano, Verlin Dike, DO    Allergies    Patient has no known allergies.  Review of Systems   Review of Systems  Constitutional:  Negative for chills and fever.  Respiratory:  Negative for chest tightness and shortness of breath.   Cardiovascular:  Negative for chest pain and palpitations.  Gastrointestinal:  Positive for abdominal pain, diarrhea, nausea and vomiting. Negative for rectal pain.  Genitourinary:  Negative for dysuria, hematuria, urgency, vaginal bleeding and vaginal discharge.  Skin:  Negative for rash and wound.  Neurological:  Positive for dizziness and light-headedness.  Psychiatric/Behavioral:  The patient is nervous/anxious.   All other systems reviewed and are negative.  Physical Exam Updated Vital Signs BP (!) 160/147 (BP Location: Right Arm)   Pulse (!) 50   Temp 98.3 F (36.8 C) (Oral)   Resp 20   Ht  (1.6 m)   Wt 127 kg   SpO2 100%   BMI 49.60 kg/m   Physical Exam Vitals and nursing note reviewed.  Constitutional:      Appearance: Normal appearance.  HENT:     Head: Normocephalic and atraumatic.     Mouth/Throat:     Mouth: Mucous membranes are dry.     Pharynx: Oropharynx is clear.  Eyes:     General: No scleral icterus.    Conjunctiva/sclera: Conjunctivae normal.  Cardiovascular:     Rate and Rhythm: Normal rate and regular rhythm.     Heart sounds: No murmur heard. Pulmonary:     Effort: Pulmonary effort is normal. No respiratory distress.  Abdominal:     General: Abdomen is flat. Bowel sounds are normal.     Tenderness: There is abdominal tenderness (non-localized).  Skin:    General: Skin is warm and dry.     Findings: No rash.  Neurological:     Mental Status: She is alert.  Psychiatric:        Mood and Affect: Mood normal.    ED Results / Procedures / Treatments   Labs (all labs ordered are listed, but only abnormal results are displayed) Labs Reviewed  COMPREHENSIVE METABOLIC PANEL -  Abnormal; Notable for the following components:      Result Value   Sodium 134 (*)    Potassium 3.1 (*)    CO2 20 (*)    Glucose, Bld 104 (*)    All other components within normal limits  CBC WITH DIFFERENTIAL/PLATELET - Abnormal; Notable for the following components:   WBC 14.8 (*)    Neutro Abs 11.6 (*)    Abs Immature Granulocytes 0.08 (*)  All other components within normal limits  URINALYSIS, ROUTINE W REFLEX MICROSCOPIC - Abnormal; Notable for the following components:   Ketones, ur 80 (*)    Leukocytes,Ua TRACE (*)    All other components within normal limits  URINALYSIS, MICROSCOPIC (REFLEX) - Abnormal; Notable for the following components:   Bacteria, UA FEW (*)    All other components within normal limits  LIPASE, BLOOD  HCG, SERUM, QUALITATIVE    EKG None  Radiology US PELVIS (TRANSABDOMINAL ONLY)  Result Date: 12/02/2020 CLINICAL DATA:  Left ovarian cyst EXAM: TRANSABDOMINAL ULTRASOUND OF PELVIS DOPPLER ULTRASOUND OF OVARIES TECHNIQUE: Transabdominal ultrasound examination of the pelvis was performed including evaluation of the uterus, ovaries, adnexal regions, and pelvic cul-de-sac. Color and duplex Doppler ultrasound was utilized to evaluate blood flow to the ovaries. COMPARISON:  CT 11/09/2016, 12/02/2020 FINDINGS: Uterus Measurements: 7.5 x 4.0 x 4.6 cm = volume: 73 mL. No fibroids or other mass visualized. Endometrium Thickness: 5 mm.  No focal abnormality visualized. Right ovary Measurements: 2.9 x 1.7 x 2.1 cm = volume: 5 mL. Normal appearance/no adnexal mass. Left ovary Measurements: 5.0 x 2.0 x 2.2 cm = volume: 11 mL. Normal appearance/no adnexal mass. Pulsed Doppler evaluation demonstrates normal low-resistance arterial and venous waveforms in both ovaries. Other: The cystic mass identified within the pelvis on recent CT examination represents a simple unilocular cyst measuring 10.8 x 10.5 x 6.0 cm in greatest dimension. This is containing no internal debris  vascularity, mural nodularity, or septation. 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 In review of the remote prior CT examination of 11/09/2016, this as slightly decreased in size, where this was seen to measure 9 x 14 x 16 cm in dimension. 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 This appears decreased in size from remote CT examination of 11/09/2016 and is safely considered benign. Electronically Signed   By: Helyn Numbers M.D.   On: 12/02/2020 20:21   CT ABDOMEN PELVIS W CONTRAST  Result Date: 12/02/2020 CLINICAL DATA:  Nausea and vomiting. Vomiting for 3 days. Diarrhea. Abdominal pain EXAM: CT ABDOMEN AND PELVIS WITH CONTRAST TECHNIQUE: Multidetector CT imaging of the abdomen and pelvis was performed using the standard protocol following bolus administration of intravenous contrast. CONTRAST:  27mL OMNIPAQUE IOHEXOL 300 MG/ML  SOLN COMPARISON:  11/09/2016 FINDINGS: Lower chest: Lung bases are clear. Hepatobiliary: No focal liver abnormality is seen. Status post cholecystectomy. No biliary dilatation. Pancreas: Unremarkable. No pancreatic ductal dilatation or surrounding inflammatory changes. Spleen: Normal in size without focal abnormality. Adrenals/Urinary Tract: Adrenal glands are unremarkable. Kidneys are normal, without renal calculi, focal lesion, or hydronephrosis. Bladder is unremarkable. Stomach/Bowel: Stomach is within normal limits. Appendix appears normal. No evidence of bowel wall thickening, distention, or inflammatory changes. Vascular/Lymphatic: No significant vascular findings are present. No enlarged abdominal or pelvic lymph nodes. Reproductive: Uterus is not enlarged. There is a large cyst arising from the left ovary and extending anteriorly in the pelvis, measuring 7.9 x 11.1 cm. Other: No abdominal wall hernia  or abnormality. No abdominopelvic ascites. Musculoskeletal: No acute or significant osseous findings. IMPRESSION: 1. 11.1 cm diameter cystic mass arising from the left ovary. 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 Recommend GYN consult and consider pelvis MRI w/o and w/ contrast if clinically warranted. Note: This recommendation does not apply to premenarchal patients and to those with increased risk (genetic, family  history, elevated tumor markers or other high-risk factors) of ovarian cancer. Reference: JACR 2020 Feb; 17(2):248-254 2. No acute process otherwise demonstrated in the abdomen or pelvis. No evidence of bowel obstruction or inflammation. Electronically Signed   By: Burman Nieves M.D.   On: 12/02/2020 18:27   US PELVIC DOPPLER (TORSION R/O OR MASS ARTERIAL FLOW)  Result Date: 12/02/2020 CLINICAL DATA:  Left ovarian cyst EXAM: TRANSABDOMINAL ULTRASOUND OF PELVIS DOPPLER ULTRASOUND OF OVARIES TECHNIQUE: Transabdominal ultrasound examination of the pelvis was performed including evaluation of the uterus, ovaries, adnexal regions, and pelvic cul-de-sac. Color and duplex Doppler ultrasound was utilized to evaluate blood flow to the ovaries. COMPARISON:  CT 11/09/2016, 12/02/2020 FINDINGS: Uterus Measurements: 7.5 x 4.0 x 4.6 cm = volume: 73 mL. No fibroids or other mass visualized. Endometrium Thickness: 5 mm.  No focal abnormality visualized. Right ovary Measurements: 2.9 x 1.7 x 2.1 cm = volume: 5 mL. Normal appearance/no adnexal mass. Left ovary Measurements: 5.0 x 2.0 x 2.2 cm = volume: 11 mL. Normal appearance/no adnexal mass. Pulsed Doppler evaluation demonstrates normal low-resistance arterial and venous waveforms in both ovaries. Other: The cystic mass identified within the pelvis on recent CT examination represents a simple unilocular cyst measuring 10.8 x 10.5 x 6.0 cm in greatest dimension. This is containing no internal debris vascularity, mural nodularity, or septation. This is  separate from both ovaries and differential considerations include a enteric duplication cyst or mesenteric cyst. In review of the remote prior CT examination of 11/09/2016, this as slightly decreased in size, where this was seen to measure 9 x 14 x 16 cm in dimension. 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 This appears decreased in size from remote CT examination of 11/09/2016 and is safely considered benign. Electronically Signed   By: Helyn Numbers M.D.   On: 12/02/2020 20:21    Procedures Procedures   Medications Ordered in ED Medications  ondansetron (ZOFRAN) injection 4 mg (4 mg Intravenous Given 12/02/20 1633)  sodium chloride 0.9 % bolus 1,000 mL (0 mLs Intravenous Stopped 12/02/20 1750)  metoCLOPramide (REGLAN) injection 10 mg (10 mg Intravenous Given 12/02/20 1718)  potassium chloride SA (KLOR-CON) CR tablet 20 mEq (20 mEq Oral Given 12/02/20 1753)  iohexol (OMNIPAQUE) 300 MG/ML solution 75 mL (75 mLs Intravenous Contrast Given 12/02/20 1800)  metoCLOPramide (REGLAN) injection 10 mg (10 mg Intravenous Given 12/02/20 1858)  morphine 4 MG/ML injection 4 mg (4 mg Intravenous Given 12/02/20 1916)    ED Course  I have reviewed the triage vital signs and the nursing notes. I reviewed her 10/22/2020 endoscopy/colonoscopy results. Negative for abnormality.   Pertinent labs & imaging results that were available during my care of the patient were reviewed by me and considered in my medical decision making (see chart for details).    MDM Rules/Calculators/A&P The differential diagnosis for generalized abdominal pain includes, but is not limited to AAA, gastroenteritis, appendicitis, Bowel obstruction, Bowel perforation. Gastroparesis, DKA, Hernia, Inflammatory bowel disease, mesenteric ischemia, pancreatitis, peritonitis SBP, volvulus.  All of these diagnoses were considered throughout my evaluation of  the patient.  Patient was found to have a leukocytosis to 14.8.  No fevers at home or in the department today.  Also hypokalemic to 3.1.  Replaced with 29meq/L PO.   CT revealed no abdominal abnormalities.  However did reveal a 11.1 centimeter cystic mass on the left ovary reported as concerning for low-grade cystic neoplasm.  Radiology  recommended GYN consult. Of note, patient was noted to have a left adnexal anechoic circumscribed 16.7 x 9 x 14.4 cm simple cyst that she was encouraged to follow-up on. Patient and her mother unable to recall a follow-up US. After speaking with MD Schlossman, I decided to obtain a TVUS to rule out ovarian torsion. Patient initially voiced agreement, however declined the TVUS. We instead did a trans-abdominal US that reported the cyst to be benign and reduced in size from prior CT exams. GYN not consulted. Patient and mother notified of need to follow-up with GYN.   Patient without nausea or vomiting. States that the Reglan helped her and is requesting discharge.  Final Clinical Impression(s) / ED Diagnoses Final diagnoses:  Ovarian mass    Rx / DC Orders Results and diagnoses were explained to the patient. Return precautions discussed in full. Patient had no additional questions and expressed complete understanding.     Woodroe Chen 12/03/20 0007    Alvira Monday, MD 12/03/20 2243

## 2020-12-02 NOTE — ED Notes (Signed)
Pt. Reports to RN she smokes 2 blunts a day and has been doing this for quite a while.

## 2020-12-02 NOTE — ED Notes (Signed)
Patient transported to CT 

## 2020-12-02 NOTE — ED Triage Notes (Addendum)
Pt with vomiting x 3 days. States some diarrhea. Denies fevers. Was at [facility] earlier and left AMA. States unable to tolerate PO, admits to marijuana use

## 2020-12-03 ENCOUNTER — Other Ambulatory Visit: Payer: Self-pay

## 2020-12-03 ENCOUNTER — Emergency Department (HOSPITAL_BASED_OUTPATIENT_CLINIC_OR_DEPARTMENT_OTHER)
Admission: EM | Admit: 2020-12-03 | Discharge: 2020-12-03 | Disposition: A | Discharge status: Home / Self Care | Payer: Medicaid Other | Attending: Emergency Medicine | Admitting: Emergency Medicine

## 2020-12-03 ENCOUNTER — Telehealth: Payer: Self-pay

## 2020-12-03 ENCOUNTER — Encounter (HOSPITAL_BASED_OUTPATIENT_CLINIC_OR_DEPARTMENT_OTHER): Payer: Self-pay | Admitting: Pharmacy Technician

## 2020-12-03 DIAGNOSIS — R1084 Generalized abdominal pain: Secondary | ICD-10-CM | POA: Diagnosis not present

## 2020-12-03 DIAGNOSIS — R112 Nausea with vomiting, unspecified: Secondary | ICD-10-CM | POA: Diagnosis not present

## 2020-12-03 DIAGNOSIS — F17210 Nicotine dependence, cigarettes, uncomplicated: Secondary | ICD-10-CM | POA: Insufficient documentation

## 2020-12-03 DIAGNOSIS — K219 Gastro-esophageal reflux disease without esophagitis: Secondary | ICD-10-CM | POA: Insufficient documentation

## 2020-12-03 DIAGNOSIS — Z8543 Personal history of malignant neoplasm of ovary: Secondary | ICD-10-CM | POA: Insufficient documentation

## 2020-12-03 LAB — COMPREHENSIVE METABOLIC PANEL
ALT: 24 U/L (ref 0–44)
AST: 23 U/L (ref 15–41)
Albumin: 4.4 g/dL (ref 3.5–5.0)
Alkaline Phosphatase: 73 U/L (ref 38–126)
Anion gap: 10 (ref 5–15)
BUN: 13 mg/dL (ref 6–20)
CO2: 22 mmol/L (ref 22–32)
Calcium: 8.7 mg/dL — ABNORMAL LOW (ref 8.9–10.3)
Chloride: 102 mmol/L (ref 98–111)
Creatinine, Ser: 0.9 mg/dL (ref 0.44–1.00)
GFR, Estimated: >60 mL/min (ref >60)
Glucose, Bld: 117 mg/dL — ABNORMAL HIGH (ref 70–99)
Potassium: 2.9 mmol/L — ABNORMAL LOW (ref 3.5–5.1)
Sodium: 134 mmol/L — ABNORMAL LOW (ref 135–145)
Total Bilirubin: 1.4 mg/dL — ABNORMAL HIGH (ref 0.3–1.2)
Total Protein: 8 g/dL (ref 6.5–8.1)

## 2020-12-03 LAB — CBC WITH DIFFERENTIAL/PLATELET
Abs Immature Granulocytes: 0.06 10*3/uL (ref 0.00–0.07)
Basophils Absolute: 0 10*3/uL (ref 0.0–0.1)
Basophils Relative: 0 %
Eosinophils Absolute: 0 10*3/uL (ref 0.0–0.5)
Eosinophils Relative: 0 %
HCT: 40 % (ref 36.0–46.0)
Hemoglobin: 13.5 g/dL (ref 12.0–15.0)
Immature Granulocytes: 0 %
Lymphocytes Relative: 19 %
Lymphs Abs: 2.6 10*3/uL (ref 0.7–4.0)
MCH: 27.4 pg (ref 26.0–34.0)
MCHC: 33.8 g/dL (ref 30.0–36.0)
MCV: 81.3 fL (ref 80.0–100.0)
Monocytes Absolute: 1.1 10*3/uL — ABNORMAL HIGH (ref 0.1–1.0)
Monocytes Relative: 8 %
Neutro Abs: 9.8 10*3/uL — ABNORMAL HIGH (ref 1.7–7.7)
Neutrophils Relative %: 73 %
Platelets: 352 10*3/uL (ref 150–400)
RBC: 4.92 MIL/uL (ref 3.87–5.11)
RDW: 13.7 % (ref 11.5–15.5)
WBC: 13.6 10*3/uL — ABNORMAL HIGH (ref 4.0–10.5)
nRBC: 0 % (ref 0.0–0.2)

## 2020-12-03 LAB — URINALYSIS, MICROSCOPIC (REFLEX)

## 2020-12-03 LAB — URINALYSIS, ROUTINE W REFLEX MICROSCOPIC
Bilirubin Urine: NEGATIVE
Glucose, UA: NEGATIVE mg/dL
Hgb urine dipstick: NEGATIVE
Ketones, ur: 80 mg/dL — AB
Nitrite: NEGATIVE
Protein, ur: NEGATIVE mg/dL
Specific Gravity, Urine: 1.02 (ref 1.005–1.030)
pH: 7 (ref 5.0–8.0)

## 2020-12-03 LAB — PREGNANCY, URINE: Preg Test, Ur: NEGATIVE

## 2020-12-03 MED ORDER — DROPERIDOL 2.5 MG/ML IJ SOLN
1.2500 mg | Freq: Once | INTRAMUSCULAR | Status: AC
  Administered 2020-12-03: 1.25 mg via INTRAVENOUS
  Filled 2020-12-03: qty 2

## 2020-12-03 MED ORDER — SODIUM CHLORIDE 0.9 % IV BOLUS
1000.0000 mL | Freq: Once | INTRAVENOUS | Status: AC
  Administered 2020-12-03: 1000 mL via INTRAVENOUS

## 2020-12-03 MED ORDER — POTASSIUM CHLORIDE CRYS ER 20 MEQ PO TBCR
40.0000 meq | EXTENDED_RELEASE_TABLET | Freq: Once | ORAL | Status: AC
  Administered 2020-12-03: 40 meq via ORAL
  Filled 2020-12-03: qty 2

## 2020-12-03 MED ORDER — METOCLOPRAMIDE HCL 5 MG/ML IJ SOLN
10.0000 mg | Freq: Once | INTRAMUSCULAR | Status: AC
  Administered 2020-12-03: 10 mg via INTRAVENOUS
  Filled 2020-12-03: qty 2

## 2020-12-03 MED ORDER — METOCLOPRAMIDE HCL 10 MG PO TABS: 10.0000 mg | ORAL_TABLET | Freq: Four times a day (QID) | ORAL | 0 refills | Status: DC

## 2020-12-03 MED ORDER — HYDROMORPHONE HCL 1 MG/ML IJ SOLN
0.5000 mg | Freq: Once | INTRAMUSCULAR | Status: AC
  Administered 2020-12-03: 0.5 mg via INTRAVENOUS
  Filled 2020-12-03: qty 1

## 2020-12-03 MED ORDER — ACETAMINOPHEN 325 MG PO TABS
650.0000 mg | ORAL_TABLET | Freq: Once | ORAL | Status: AC
  Administered 2020-12-03: 650 mg via ORAL
  Filled 2020-12-03: qty 2

## 2020-12-03 NOTE — Telephone Encounter (Signed)
Nurse Assessment Nurse: Ferol Luz, RN, Aurther Loft Date/Time (Eastern Time): 12/02/2020 3:44:17 PM Confirm and document reason for call. If symptomatic, describe symptoms. ---Caller states she has been vomiting for three days. No fever or diarrhea reports. C/o generalized abdominal pain. Does the patient have any new or worsening symptoms? ---Yes Will a triage be completed? ---Yes Related visit to physician within the last 2 weeks? ---No Does the PT have any chronic conditions? (i.e. diabetes, asthma, this includes High risk factors for pregnancy, etc.) ---No Is the patient pregnant or possibly pregnant? (Ask all females between the ages of 33-55) ---No Is this a behavioral health or substance abuse call? ---No Guidelines Guideline Title Affirmed Question Affirmed Notes Nurse Date/Time (Eastern Time) Vomiting Shock suspected (e.g., cold/pale/ clammy skin, too weak to stand, low BP, rapid pulse) Bogard, RN, Aurther Loft 12/02/2020 3:46:23 PM Disp. Time Lamount Cohen Time) Disposition Final User 12/02/2020 3:05:53 PM Attempt made - message left Bogard, RN, Aurther Loft 12/02/2020 3:24:26 PM Attempt made - message left Bogard, RN, Aurther Loft PLEASE NOTE: All timestamps contained within this report are represented as [place] Standard Time. CONFIDENTIALTY NOTICE: This fax transmission is intended only for the addressee. It contains information that is legally privileged, confidential or otherwise protected from use or disclosure. If you are not the intended recipient, you are strictly prohibited from reviewing, disclosing, copying using or disseminating any of this information or taking any action in reliance on or regarding this information. If you have received this fax in error, please notify us immediately by telephone so that we can arrange for its return to [place]. Phone: [phone], Toll-Free: [phone], Fax: [phone] Page: 2 of 2 Call Id: [id number] 12/02/2020 3:47:38 PM Call EMS 911 Now Yes Bogard, RN,  Tenna Delaine Disagree/Comply Comply Caller Understands Yes PreDisposition Go to ED Care Advice Given Per Guideline CALL EMS 911 NOW: Comments User: Ephriam Knuckles, RN Date/Time Lamount Cohen Time): 12/02/2020 3:48:29 PM Pt stated she was currently at the ED. Referrals GO TO FACILITY OTHER - SPECIFY  Pt seen in ED.

## 2020-12-03 NOTE — ED Triage Notes (Signed)
Pt bib ems with NV X4 days with abdominal pain. Seen yesterday for same. Pt dx with ovarian cyst and given RX for phenergan and K+. Pt states phenergan is not working.  BP 138/68 HR 70 RR 16 100% RA CBG 116

## 2020-12-03 NOTE — ED Provider Notes (Signed)
[facility] EMERGENCY DEPARTMENT Provider Note   CSN: [id number] Arrival date & time: 12/03/20  0847     History Chief Complaint  Patient presents with   Emesis   Abdominal Pain    Christine Trujillo is a 25 y.o. female.  Presenting to the emergency room with concern for abdominal pain, nausea and vomiting.  Patient states symptoms have been ongoing for the last few days, came to ER yesterday for same symptoms.  Was feeling better at time of discharge but last night and into this morning started having worsening symptoms again.  Generalized pain, aching, 7.5 out of 10 in severity.  Multiple episodes of nonbloody nonbilious emesis.  No fevers.  Phenergan at home was not helping symptoms.  Completed chart review.  CT scan demonstrated 11 cm cystic mass arising from left ovary concerning for low-grade cystic neoplasm.  Ultrasound performed and negative for torsion.  Radiologist stated that the cystic mass appears similar, similar to slightly decreased size on CT scan in 2018.  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  Patient was recommended to follow-up with primary care doctor and her gynecologist.  HPI     Past Medical History:  Diagnosis Date   Anemia    during pregnancy   Anxiety    Depression    Gallstone    GERD (gastroesophageal reflux disease)    Gestational diabetes    glyburide   Headache    Migraine    Obesity    Paratubal cyst 02/19/2016    Patient Active Problem List   Diagnosis Date Noted   Pain of upper abdomen    Non-intractable vomiting    Family history- stomach cancer    Diarrhea    Change in bowel habits    Depression, major, single episode, mild (HCC) 01/23/2020   Capsulitis of right shoulder 10/05/2019   GAD (generalized anxiety disorder) 04/20/2019   Dermatitis 05/10/2018   Closed fracture of tooth 05/10/2018   Chronic midline low back pain without sciatica 07/21/2016   BMI 45.0-49.9, adult (HCC) 04/22/2016    Depression, recurrent (HCC) 04/22/2016   Right ovarian cyst affecting pregnancy in second trimester, antepartum 03/25/2016    Past Surgical History:  Procedure Laterality Date   BIOPSY  10/22/2020   Procedure: BIOPSY;  Surgeon: Shellia Cleverly, DO;  Location: [facility];  Service: Gastroenterology;;   CHOLECYSTECTOMY N/A 12/02/2016   Procedure: LAPAROSCOPIC CHOLECYSTECTOMY WITH INTRAOPERATIVE CHOLANGIOGRAM;  Surgeon: Ovidio Kin, MD;  Location: [facility];  Service: General;  Laterality: N/A;   COLONOSCOPY WITH PROPOFOL N/A 10/22/2020   Procedure: COLONOSCOPY WITH PROPOFOL;  Surgeon: Shellia Cleverly, DO;  Location: [facility];  Service: Gastroenterology;  Laterality: N/A;   ESOPHAGOGASTRODUODENOSCOPY (EGD) WITH PROPOFOL N/A 10/22/2020   Procedure: ESOPHAGOGASTRODUODENOSCOPY (EGD) WITH PROPOFOL;  Surgeon: Shellia Cleverly, DO;  Location: [facility];  Service: Gastroenterology;  Laterality: N/A;   ESOPHAGOGASTRODUODENOSCOPY (EGD) WITH PROPOFOL N/A 10/22/2020   Procedure: ESOPHAGOGASTRODUODENOSCOPY (EGD) WITH PROPOFOL;  Surgeon: Shellia Cleverly, DO;  Location: [facility];  Service: Gastroenterology;  Laterality: N/A;   WISDOM TOOTH EXTRACTION       OB History     Gravida  1   Para  1   Term  1   Preterm      AB      Living  1      SAB      IAB      Ectopic      Multiple  0   Live Births  1           Family History  Problem Relation Age of Onset   Diabetes Mother    Heart disease Mother        CHF   Stomach cancer Brother    Colon cancer Neg Hx    Pancreatic cancer Neg Hx    Esophageal cancer Neg Hx    Liver disease Neg Hx     Social History   Tobacco Use   Smoking status: Every Day    Types: Cigarettes   Smokeless tobacco: Never  Vaping Use   Vaping Use: Never used  Substance Use Topics   Alcohol use: No   Drug use: Yes    Types: Marijuana    Home Medications Prior to Admission medications   Medication Sig Start Date End Date  Taking? Authorizing Provider  metoCLOPramide (REGLAN) 10 MG tablet Take 1 tablet (10 mg total) by mouth every 6 (six) hours. 12/03/20  Yes Milagros Loll, MD  Biotin w/ Vitamins C & E (HAIR/SKIN/NAILS PO) Take 1 tablet by mouth daily.    [provider]  Multiple Vitamins-Minerals (MULTIVITAMIN WITH MINERALS) tablet Take 1 tablet by mouth daily.    [provider]  omeprazole (PRILOSEC OTC) 20 MG tablet Take 1 tablet (20 mg total) by mouth 2 (two) times daily. 10/22/20 10/22/21  Cirigliano, Vito V, DO  potassium chloride (KLOR-CON) 10 MEQ tablet Take 2 tablets (20 mEq total) by mouth 2 (two) times daily for 2 days. 12/02/20 12/04/20  Alvira Monday, MD  promethazine (PHENERGAN) 25 MG tablet Take 1 tablet (25 mg total) by mouth every 6 (six) hours as needed for nausea or vomiting. 12/02/20   Alvira Monday, MD    Allergies    Patient has no known allergies.  Review of Systems   Review of Systems  Constitutional:  Negative for chills and fever.  HENT:  Negative for ear pain and sore throat.   Eyes:  Negative for pain and visual disturbance.  Respiratory:  Negative for cough and shortness of breath.   Cardiovascular:  Negative for chest pain and palpitations.  Gastrointestinal:  Positive for abdominal pain, nausea and vomiting.  Genitourinary:  Negative for dysuria and hematuria.  Musculoskeletal:  Negative for arthralgias and back pain.  Skin:  Negative for color change and rash.  Neurological:  Negative for seizures and syncope.  All other systems reviewed and are negative.  Physical Exam Updated Vital Signs BP (!) 149/85   Pulse (!) 55   Temp (!) 97.4 F (36.3 C)   Resp 16   SpO2 100%   Physical Exam Vitals and nursing note reviewed.  Constitutional:      General: She is not in acute distress.    Appearance: She is well-developed.  HENT:     Head: Normocephalic and atraumatic.  Eyes:     Conjunctiva/sclera: Conjunctivae normal.  Cardiovascular:      Rate and Rhythm: Normal rate and regular rhythm.     Heart sounds: No murmur heard. Pulmonary:     Effort: Pulmonary effort is normal. No respiratory distress.     Breath sounds: Normal breath sounds.  Abdominal:     Palpations: Abdomen is soft.     Tenderness: There is abdominal tenderness.     Comments: Generalized tenderness but no rebound or guarding  Musculoskeletal:     Cervical back: Neck supple.  Skin:    General: Skin is warm and dry.  Neurological:     General: No  focal deficit present.     Mental Status: She is alert.    ED Results / Procedures / Treatments   Labs (all labs ordered are listed, but only abnormal results are displayed) Labs Reviewed  CBC WITH DIFFERENTIAL/PLATELET - Abnormal; Notable for the following components:      Result Value   WBC 13.6 (*)    Neutro Abs 9.8 (*)    Monocytes Absolute 1.1 (*)    All other components within normal limits  COMPREHENSIVE METABOLIC PANEL - Abnormal; Notable for the following components:   Sodium 134 (*)    Potassium 2.9 (*)    Glucose, Bld 117 (*)    Calcium 8.7 (*)    Total Bilirubin 1.4 (*)    All other components within normal limits  URINALYSIS, ROUTINE W REFLEX MICROSCOPIC - Abnormal; Notable for the following components:   Ketones, ur 80 (*)    Leukocytes,Ua SMALL (*)    All other components within normal limits  URINALYSIS, MICROSCOPIC (REFLEX) - Abnormal; Notable for the following components:   Bacteria, UA MANY (*)    All other components within normal limits  PREGNANCY, URINE    EKG None  Radiology US PELVIS (TRANSABDOMINAL ONLY)  Result Date: 12/02/2020 CLINICAL DATA:  Left ovarian cyst EXAM: TRANSABDOMINAL ULTRASOUND OF PELVIS DOPPLER ULTRASOUND OF OVARIES TECHNIQUE: Transabdominal ultrasound examination of the pelvis was performed including evaluation of the uterus, ovaries, adnexal regions, and pelvic cul-de-sac. Color and duplex Doppler ultrasound was utilized to evaluate blood flow to the  ovaries. COMPARISON:  CT 11/09/2016, 12/02/2020 FINDINGS: Uterus Measurements: 7.5 x 4.0 x 4.6 cm = volume: 73 mL. No fibroids or other mass visualized. Endometrium Thickness: 5 mm.  No focal abnormality visualized. Right ovary Measurements: 2.9 x 1.7 x 2.1 cm = volume: 5 mL. Normal appearance/no adnexal mass. Left ovary Measurements: 5.0 x 2.0 x 2.2 cm = volume: 11 mL. Normal appearance/no adnexal mass. Pulsed Doppler evaluation demonstrates normal low-resistance arterial and venous waveforms in both ovaries. Other: The cystic mass identified within the pelvis on recent CT examination represents a simple unilocular cyst measuring 10.8 x 10.5 x 6.0 cm in greatest dimension. This is containing no internal debris vascularity, mural nodularity, or septation. 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 In review of the remote prior CT examination of 11/09/2016, this as slightly decreased in size, where this was seen to measure 9 x 14 x 16 cm in dimension. 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 This appears decreased in size from remote CT examination of 11/09/2016 and is safely considered benign. Electronically Signed   By: Helyn NumbersAshesh  Parikh M.D.   On: 12/02/2020 20:21   CT ABDOMEN PELVIS W CONTRAST  Result Date: 12/02/2020 CLINICAL DATA:  Nausea and vomiting. Vomiting for 3 days. Diarrhea. Abdominal pain EXAM: CT ABDOMEN AND PELVIS WITH CONTRAST TECHNIQUE: Multidetector CT imaging of the abdomen and pelvis was performed using the standard protocol following bolus administration of intravenous contrast. CONTRAST:  75mL OMNIPAQUE IOHEXOL 300 MG/ML  SOLN COMPARISON:  11/09/2016 FINDINGS: Lower chest: Lung bases are clear. Hepatobiliary: No focal liver abnormality is seen. Status post cholecystectomy. No biliary dilatation. Pancreas: Unremarkable.  No pancreatic ductal dilatation or surrounding inflammatory changes. Spleen: Normal in size without focal abnormality. Adrenals/Urinary Tract: Adrenal glands are unremarkable. Kidneys are normal, without renal calculi, focal lesion, or hydronephrosis. Bladder is unremarkable. Stomach/Bowel: Stomach  is within normal limits. Appendix appears normal. No evidence of bowel wall thickening, distention, or inflammatory changes. Vascular/Lymphatic: No significant vascular findings are present. No enlarged abdominal or pelvic lymph nodes. Reproductive: Uterus is not enlarged. There is a large cyst arising from the left ovary and extending anteriorly in the pelvis, measuring 7.9 x 11.1 cm. Other: No abdominal wall hernia or abnormality. No abdominopelvic ascites. Musculoskeletal: No acute or significant osseous findings. IMPRESSION: 1. 11.1 cm diameter cystic mass arising from the left ovary. 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 Recommend GYN consult and consider pelvis MRI w/o and w/ contrast if clinically warranted. Note: This recommendation does not apply to premenarchal patients and to those with increased risk (genetic, family history, elevated tumor markers or other high-risk factors) of ovarian cancer. Reference: JACR 2020 Feb; 17(2):248-254 2. No acute process otherwise demonstrated in the abdomen or pelvis. No evidence of bowel obstruction or inflammation. Electronically Signed   By: Burman Nieves M.D.   On: 12/02/2020 18:27   US PELVIC DOPPLER (TORSION R/O OR MASS ARTERIAL FLOW)  Result Date: 12/02/2020 CLINICAL DATA:  Left ovarian cyst EXAM: TRANSABDOMINAL ULTRASOUND OF PELVIS DOPPLER ULTRASOUND OF OVARIES TECHNIQUE: Transabdominal ultrasound examination of the pelvis was performed including evaluation of the uterus, ovaries, adnexal regions, and pelvic cul-de-sac. Color and duplex Doppler ultrasound was utilized to evaluate blood flow to the ovaries. COMPARISON:  CT 11/09/2016, 12/02/2020  FINDINGS: Uterus Measurements: 7.5 x 4.0 x 4.6 cm = volume: 73 mL. No fibroids or other mass visualized. Endometrium Thickness: 5 mm.  No focal abnormality visualized. Right ovary Measurements: 2.9 x 1.7 x 2.1 cm = volume: 5 mL. Normal appearance/no adnexal mass. Left ovary Measurements: 5.0 x 2.0 x 2.2 cm = volume: 11 mL. Normal appearance/no adnexal mass. Pulsed Doppler evaluation demonstrates normal low-resistance arterial and venous waveforms in both ovaries. Other: The cystic mass identified within the pelvis on recent CT examination represents a simple unilocular cyst measuring 10.8 x 10.5 x 6.0 cm in greatest dimension. This is containing no internal debris vascularity, mural nodularity, or septation. 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 In review of the remote prior CT examination of 11/09/2016, this as slightly decreased in size, where this was seen to measure 9 x 14 x 16 cm in dimension. 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 This appears decreased in size from remote CT examination of 11/09/2016 and is safely considered benign. Electronically Signed   By: Helyn Numbers M.D.   On: 12/02/2020 20:21    Procedures Procedures   Medications Ordered in ED Medications  droperidol (INAPSINE) 2.5 MG/ML injection 1.25 mg (1.25 mg Intravenous Given 12/03/20 0928)  sodium chloride 0.9 % bolus 1,000 mL (0 mLs Intravenous Stopped 12/03/20 1047)  HYDROmorphone (DILAUDID) injection 0.5 mg (0.5 mg Intravenous Given 12/03/20 0928)  potassium chloride SA (KLOR-CON) CR tablet 40 mEq (40 mEq Oral Given 12/03/20 1005)  metoCLOPramide (REGLAN) injection 10 mg (10 mg Intravenous Given 12/03/20 1046)  acetaminophen (TYLENOL) tablet 650 mg (650 mg Oral Given 12/03/20 1101)    ED Course  I have reviewed the triage vital signs and the nursing  notes.  Pertinent labs & imaging results that were available during my care of the patient were reviewed by me and considered in my medical decision making (see chart for details).    MDM Rules/Calculators/A&P  25 year old lady presents to ER with concern for abdominal pain, nausea and vomiting.  On exam patient well-appearing in no distress with stable vital signs.  Her abdomen was soft but did have generalized tenderness.  Patient had both pelvic ultrasound and CT scan performed yesterday.  Has a simple cyst in pelvis which according to the ultrasound is most likely benign and has been present since imaging on 2018.  No other acute abdominal pelvic pathology was identified.  Repeated labs today.  She was found to have hypokalemia but otherwise stable when compared to yesterday.  Provided some oral replacement.  She was provided fluids, pain and antiemetics.  Achieved good symptomatic control, patient tolerating p.o. without difficulty.  Recommend patient follow-up both with her primary care doctor and gynecology and discharged home.    After the discussed management above, the patient was determined to be safe for discharge.  The patient was in agreement with this plan and all questions regarding their care were answered.  ED return precautions were discussed and the patient will return to the ED with any significant worsening of condition.  Final Clinical Impression(s) / ED Diagnoses Final diagnoses:  Nausea and vomiting, intractability of vomiting not specified, unspecified vomiting type    Rx / DC Orders ED Discharge Orders          Ordered    metoCLOPramide (REGLAN) 10 MG tablet  Every 6 hours        12/03/20 1109             Milagros Loll, MD 12/03/20 1123

## 2020-12-03 NOTE — Discharge Instructions (Addendum)
Take nausea medicine as needed as directed prescription.  If you develop uncontrolled nausea, vomiting, any fever, worsening abdominal pain or other new concerns symptom, come back to ER for reassessment.  Follow-up both with your OB/GYN and with your primary care doctor.  Take the potassium supplement as previously prescribed.

## 2020-12-03 NOTE — ED Notes (Signed)
Patient Alert and oriented to baseline. Stable and ambulatory to baseline. Patient verbalized understanding of the discharge instructions.  Patient belongings were taken by the patient.   

## 2021-01-05 ENCOUNTER — Encounter: Payer: Medicaid Other | Admitting: Family Medicine

## 2021-01-06 ENCOUNTER — Other Ambulatory Visit: Payer: Self-pay | Admitting: Family Medicine

## 2021-01-06 NOTE — Progress Notes (Signed)
f °

## 2021-01-13 ENCOUNTER — Other Ambulatory Visit: Payer: Self-pay

## 2021-01-13 ENCOUNTER — Ambulatory Visit (INDEPENDENT_AMBULATORY_CARE_PROVIDER_SITE_OTHER): Payer: Medicaid Other | Admitting: *Deleted

## 2021-01-13 DIAGNOSIS — Z3042 Encounter for surveillance of injectable contraceptive: Secondary | ICD-10-CM

## 2021-01-13 DIAGNOSIS — Z309 Encounter for contraceptive management, unspecified: Secondary | ICD-10-CM | POA: Diagnosis not present

## 2021-01-13 MED ORDER — MEDROXYPROGESTERONE ACETATE 150 MG/ML IM SUSP
150.0000 mg | Freq: Once | INTRAMUSCULAR | Status: AC
  Administered 2021-01-13: 150 mg via INTRAMUSCULAR

## 2021-01-13 NOTE — Progress Notes (Signed)
Christine Trujillo is a 25 y.o. female presents to the office today for depo provera, per physician's orders.   Depo provera (med), 150 mg (dose),  IM (route) was administered left deltoid per patient request (location) today. Patient tolerated injection.   Patient next injection due: 03/31/21-04/14/21, appt made Yes

## 2021-01-13 NOTE — Progress Notes (Deleted)
Christine Trujillo is a 25 y.o. female presents to the office today for Depo Provera injection, per physician's orders. Original order: *** *** (med), *** (dose),  *** (route) was administered *** (location) today. Patient tolerated injection. Patient due for follow up labs/provider appt: {yes/no:20286}. Date due: ***, appt made {yes/no:20286} Patient next injection due: 90 days , appt made {yes/no:20286}  Creft, Melton Alar L

## 2021-02-02 ENCOUNTER — Encounter: Payer: Self-pay | Admitting: Obstetrics & Gynecology

## 2021-02-02 ENCOUNTER — Ambulatory Visit (INDEPENDENT_AMBULATORY_CARE_PROVIDER_SITE_OTHER): Payer: Medicaid Other | Admitting: Obstetrics & Gynecology

## 2021-02-02 ENCOUNTER — Other Ambulatory Visit: Payer: Self-pay

## 2021-02-02 ENCOUNTER — Other Ambulatory Visit (HOSPITAL_COMMUNITY)
Admission: RE | Admit: 2021-02-02 | Discharge: 2021-02-02 | Disposition: A | Discharge status: Home / Self Care | Payer: Medicaid Other | Source: Ambulatory Visit | Attending: Obstetrics & Gynecology | Admitting: Obstetrics & Gynecology

## 2021-02-02 ENCOUNTER — Encounter: Payer: Self-pay | Admitting: General Practice

## 2021-02-02 VITALS — BP 143/90 | HR 80 | Wt 268.0 lb

## 2021-02-02 DIAGNOSIS — N9489 Other specified conditions associated with female genital organs and menstrual cycle: Secondary | ICD-10-CM

## 2021-02-02 DIAGNOSIS — Z6841 Body Mass Index (BMI) 40.0 and over, adult: Secondary | ICD-10-CM

## 2021-02-02 DIAGNOSIS — Z01419 Encounter for gynecological examination (general) (routine) without abnormal findings: Secondary | ICD-10-CM

## 2021-02-02 DIAGNOSIS — R102 Pelvic and perineal pain: Secondary | ICD-10-CM

## 2021-02-02 DIAGNOSIS — R1909 Other intra-abdominal and pelvic swelling, mass and lump: Secondary | ICD-10-CM

## 2021-02-02 DIAGNOSIS — Z0000 Encounter for general adult medical examination without abnormal findings: Secondary | ICD-10-CM | POA: Diagnosis not present

## 2021-02-02 DIAGNOSIS — Z113 Encounter for screening for infections with a predominantly sexual mode of transmission: Secondary | ICD-10-CM

## 2021-02-02 DIAGNOSIS — Z3009 Encounter for other general counseling and advice on contraception: Secondary | ICD-10-CM | POA: Diagnosis not present

## 2021-02-02 NOTE — Progress Notes (Signed)
Subjective:     Christine Trujillo is a 25 y.o. female here for a routine exam.  Current complaints: Pt has not had an annual since 2017. She reports pain on her right side. She reports a cyst on the right side that is intermittently painful. Not resolving. She wants definitve treatment of this. Pt also reports that she does not want any more children. She requests permanent sterilization.  She reports that she is aware that this is permanent and requests the most fail proof procedure.     Gynecologic History No LMP recorded. Patient has had an injection. Last Pap: 05/27/2015. Results were: normal Last mammogram: n/a.   Obstetric History OB History  Gravida Para Term Preterm AB Living  1 1 1     1   SAB IAB Ectopic Multiple Live Births        0 1    # Outcome Date GA Lbr Len/2nd Weight Sex Delivery Anes PTL Lv  1 Term 10/11/16 [redacted]w[redacted]d 01:16 / 00:10 7 lb 14.6 oz (3.589 kg) F Vag-Spont None  LIV     Birth Comments: WNL     The following portions of the patient's history were reviewed and updated as appropriate: allergies, current medications, past family history, past medical history, past social history, past surgical history, and problem list.  Review of Systems Pertinent items are noted in HPI.    Objective:  BP (!) 143/90   Pulse 80   Wt 268 lb (121.6 kg)   BMI 47.47 kg/m   General Appearance:    Alert, cooperative, no distress, appears stated age  Head:    Normocephalic, without obvious abnormality, atraumatic  Eyes:    conjunctiva/corneas clear, EOM's intact, both eyes  Ears:    Normal external ear canals, both ears  Nose:   Nares normal, septum midline, mucosa normal, no drainage    or sinus tenderness  Throat:   Lips, mucosa, and tongue normal; teeth and gums normal  Neck:   Supple, symmetrical, trachea midline, no adenopathy;    thyroid:  no enlargement/tenderness/nodules  Back:     Symmetric, no curvature, ROM normal, no CVA tenderness  Lungs:     respirations unlabored   Chest Wall:    No tenderness or deformity   Heart:    Regular rate and rhythm  Breast Exam:    No tenderness, masses, or nipple abnormality  Abdomen:     Soft, non-tender, bowel sounds active all four quadrants,    no masses, no organomegaly  Genitalia:    Normal female without lesion, discharge or tenderness   Adnexa difficult to palpate due to body habitus. No CMT noted.   Extremities:   Extremities normal, atraumatic, no cyanosis or edema  Pulses:   2+ and symmetric all extremities  Skin:   Skin color, texture, turgor normal, no rashes or lesions      12/02/2020 CLINICAL DATA:  Left ovarian cyst   EXAM: TRANSABDOMINAL ULTRASOUND OF PELVIS   DOPPLER ULTRASOUND OF OVARIES   TECHNIQUE: Transabdominal ultrasound examination of the pelvis was performed including evaluation of the uterus, ovaries, adnexal regions, and pelvic cul-de-sac.   Color and duplex Doppler ultrasound was utilized to evaluate blood flow to the ovaries.   COMPARISON:  CT 11/09/2016, 12/02/2020   FINDINGS: Uterus   Measurements: 7.5 x 4.0 x 4.6 cm = volume: 73 mL. No fibroids or other mass visualized.   Endometrium   Thickness: 5 mm.  No focal abnormality visualized.   Right ovary  Measurements: 2.9 x 1.7 x 2.1 cm = volume: 5 mL. Normal appearance/no adnexal mass.   Left ovary   Measurements: 5.0 x 2.0 x 2.2 cm = volume: 11 mL. Normal appearance/no adnexal mass.   Pulsed Doppler evaluation demonstrates normal low-resistance arterial and venous waveforms in both ovaries.   Other: The cystic mass identified within the pelvis on recent CT examination represents a simple unilocular cyst measuring 10.8 x 10.5 x 6.0 cm in greatest dimension. This is containing no internal debris vascularity, mural nodularity, or septation. 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 In review of the remote prior CT examination of  11/09/2016, this as slightly decreased in size, where this was seen to measure 9 x 14 x 16 cm in dimension.   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 This appears decreased in size from remote CT examination of 11/09/2016 and is safely considered benign.     Assessment:    Healthy female exam.  Persistent simple cyst in pelvis. Symptomatic. Marland Kitchen     Plan:   Diagnoses and all orders for this visit:  Well female exam with routine gynecological exam -     Cytology - PAP( [place]) -     Hemoglobin A1c -     TSH -     CBC  Adnexal mass  Sterilization consult  Routine screening for STI (sexually transmitted infection)  Pelvic pain in female  BMI 45.0-49.9, adult [facility])    Patient desires surgical management with laparoscopic resection of adnexal mass, possible oophorectomy and bilateral salpingectomy. The risk of failure of sterilization of 3-07/998 was reviewed with pt. I also disucssed the increased risk of ectopic pregnancy. The risks of surgery were discussed in detail with the patient including but not limited to: bleeding which may require transfusion or reoperation; infection which may require prolonged hospitalization or re-hospitalization and antibiotic therapy; injury to bowel, bladder, ureters and major vessels or other surrounding organs; need for additional procedures including laparotomy; thromboembolic phenomenon, incisional problems and other postoperative or anesthesia complications.  Patient was told that the likelihood that her condition and symptoms will be treated effectively with this surgical management was very high; the postoperative expectations were also discussed in detail. The patient also understands the alternative treatment options which were discussed in full. All questions were answered.  She was told that she will be contacted by our surgical scheduler  regarding the time and date of her surgery; routine preoperative instructions of having nothing to eat or drink after midnight on the day prior to surgery and also coming to the hospital 1 1/2 hours prior to her time of surgery were also emphasized.  She was told she may be called for a preoperative appointment about a week prior to surgery and will be given further preoperative instructions at that visit. Printed patient education handouts about the procedure were given to the patient to review at home.   Bessie Boyte L. Harraway-Smith, M.D., [place] [place]

## 2021-02-03 LAB — CBC
Hematocrit: 38.8 % (ref 34.0–46.6)
Hemoglobin: 13.1 g/dL (ref 11.1–15.9)
MCH: 27.7 pg (ref 26.6–33.0)
MCHC: 33.8 g/dL (ref 31.5–35.7)
MCV: 82 fL (ref 79–97)
Platelets: 334 10*3/uL (ref 150–450)
RBC: 4.73 x10E6/uL (ref 3.77–5.28)
RDW: 13.5 % (ref 11.7–15.4)
WBC: 9.8 10*3/uL (ref 3.4–10.8)

## 2021-02-03 LAB — CYTOLOGY - PAP
Chlamydia: NEGATIVE
Comment: NEGATIVE
Comment: NEGATIVE
Comment: NORMAL
Diagnosis: NEGATIVE
Neisseria Gonorrhea: NEGATIVE
Trichomonas: NEGATIVE

## 2021-02-03 LAB — HEMOGLOBIN A1C
Est. average glucose Bld gHb Est-mCnc: 108 mg/dL
Hgb A1c MFr Bld: 5.4 % (ref 4.8–5.6)

## 2021-02-03 LAB — TSH: TSH: 0.743 u[IU]/mL (ref 0.450–4.500)

## 2021-02-07 ENCOUNTER — Encounter: Payer: Self-pay | Admitting: Obstetrics & Gynecology

## 2021-02-16 ENCOUNTER — Telehealth: Payer: Self-pay

## 2021-02-16 NOTE — Telephone Encounter (Signed)
Spoke to pt about coming to the office to sign another BTL consent. Pt states she has an appointment with her PCP on 02/10/21 and she will stop by the office to sign another BTL consent.  Cherryl Babin l Basilia Stuckert, CMA

## 2021-02-16 NOTE — Telephone Encounter (Signed)
-----   Message from Olevia Bowens sent at 02/09/2021 11:30 AM EDT ----- Regarding: Invalid BTl Consent Form Invalid, please have her resigned. The "O" in October is overcorrected.

## 2021-02-19 ENCOUNTER — Telehealth: Payer: Self-pay

## 2021-02-19 NOTE — Telephone Encounter (Signed)
Called pt, she prefer 12/6 surgery date.

## 2021-02-24 ENCOUNTER — Telehealth: Payer: Self-pay | Admitting: Family Medicine

## 2021-02-24 ENCOUNTER — Other Ambulatory Visit: Payer: Self-pay

## 2021-02-24 NOTE — Telephone Encounter (Signed)
Scheduled appt for 02/25/21

## 2021-02-24 NOTE — Telephone Encounter (Signed)
Pt stated she was experiencing numbness in her legs and wondered if she should wait to go over that at her appt tmr. She was transferred to triage to seek advice.

## 2021-02-25 ENCOUNTER — Encounter: Payer: Medicaid Other | Admitting: Family Medicine

## 2021-02-25 NOTE — Telephone Encounter (Signed)
Patient has appt with PCP today, 02/25/2021.   [facility] - Client TELEPHONE ADVICE RECORDAccessNurse Patient Name:Christine Trujillo [facility] Reason for Call Symptomatic / Request for Health Information Initial Comment Caller states states both legs are numb. Has an appt tomorrow but wants to make sure she doesn't need to come in today. States her back was numb and one of her legs was numb. Now both legs are numb and she is having difficulty walking. Translation No Nurse Assessment Nurse: Humfleet, RN, Marchelle Folks Date/Time (Eastern Time): 02/24/2021 11:01:43 AM Confirm and document reason for call. If symptomatic, describe symptoms. ---caller states her back and one of her legs was numb and then both legs. has back pain. Does the patient have any new or worsening symptoms? ---Yes Will a triage be completed? ---Yes Related visit to physician within the last 2 weeks? ---No Does the PT have any chronic conditions? (i.e. diabetes, asthma, this includes High risk factors for pregnancy, etc.) ---Yes List chronic conditions. ---back pain Is the patient pregnant or possibly pregnant? (Ask all females between the ages of 27-55) ---No Is this a behavioral health or substance abuse call? ---No Guidelines Guideline Title Affirmed Question Affirmed Notes Nurse Date/Time (Eastern Time) Back Pain [1] Pain radiates into the thigh or further down the leg AND [2] both legs Humfleet, RN, Marchelle Folks 02/24/2021 11:02:38 AM Disp. Time Lamount Cohen Time) Disposition Final User PLEASE NOTE: All timestamps contained within this report are represented as [place] Standard Time. CONFIDENTIALTY NOTICE: This fax transmission is intended only for the addressee. It contains information that is legally privileged, confidential or otherwise protected from use or disclosure. If you are not the intended recipient, you are strictly prohibited from reviewing, disclosing, copying using or disseminating  any of this information or taking any action in reliance on or regarding this information. If you have received this fax in error, please notify us immediately by telephone so that we can arrange for its return to [place]. Phone: [phone], Toll-Free: [phone], Fax: [phone] Page: 2 of 2 Call Id: [id number] 02/24/2021 11:05:51 AM See HCP within 4 Hours (or PCP triage) Yes Humfleet, RN, Earnestine Leys Disagree/Comply Comply Caller Understands Yes PreDisposition Did not know what to do Care Advice Given Per Guideline SEE HCP (OR PCP TRIAGE) WITHIN 4 HOURS: * IF OFFICE WILL BE OPEN: You need to be seen within the next 3 or 4 hours. Call your doctor (or NP/PA) now or as soon as the office opens. CARE ADVICE given per Back Pain (Adult) guideline. * You become worse CALL BACK IF: Comments User: Jaclynn Major, RN Date/Time Lamount Cohen Time): 02/24/2021 11:06:53 AM patient stated she would just go to the ER instead of seeing about an appt today Referrals [facility] - ED

## 2021-03-03 ENCOUNTER — Other Ambulatory Visit: Payer: Self-pay

## 2021-03-04 ENCOUNTER — Ambulatory Visit (INDEPENDENT_AMBULATORY_CARE_PROVIDER_SITE_OTHER): Payer: Medicaid Other | Admitting: Family Medicine

## 2021-03-04 ENCOUNTER — Encounter: Payer: Self-pay | Admitting: Family Medicine

## 2021-03-04 VITALS — BP 128/80 | HR 84 | Temp 98.2°F | Ht 63.0 in | Wt 266.2 lb

## 2021-03-04 DIAGNOSIS — R6889 Other general symptoms and signs: Secondary | ICD-10-CM | POA: Diagnosis not present

## 2021-03-04 DIAGNOSIS — Z0000 Encounter for general adult medical examination without abnormal findings: Secondary | ICD-10-CM

## 2021-03-04 DIAGNOSIS — K219 Gastro-esophageal reflux disease without esophagitis: Secondary | ICD-10-CM | POA: Diagnosis not present

## 2021-03-04 LAB — CBC
HCT: 40.2 % (ref 36.0–46.0)
Hemoglobin: 13.1 g/dL (ref 12.0–15.0)
MCHC: 32.6 g/dL (ref 30.0–36.0)
MCV: 83.4 fl (ref 78.0–100.0)
Platelets: 298 10*3/uL (ref 150.0–400.0)
RBC: 4.82 Mil/uL (ref 3.87–5.11)
RDW: 13.7 % (ref 11.5–15.5)
WBC: 8 10*3/uL (ref 4.0–10.5)

## 2021-03-04 LAB — COMPREHENSIVE METABOLIC PANEL
ALT: 10 U/L (ref 0–35)
AST: 12 U/L (ref 0–37)
Albumin: 4.3 g/dL (ref 3.5–5.2)
Alkaline Phosphatase: 86 U/L (ref 39–117)
BUN: 8 mg/dL (ref 6–23)
CO2: 24 mEq/L (ref 19–32)
Calcium: 9 mg/dL (ref 8.4–10.5)
Chloride: 105 mEq/L (ref 96–112)
Creatinine, Ser: 0.82 mg/dL (ref 0.40–1.20)
GFR: 98.74 mL/min (ref >60.00)
Glucose, Bld: 114 mg/dL — ABNORMAL HIGH (ref 70–99)
Potassium: 3.7 mEq/L (ref 3.5–5.1)
Sodium: 138 mEq/L (ref 135–145)
Total Bilirubin: 0.8 mg/dL (ref 0.2–1.2)
Total Protein: 7.1 g/dL (ref 6.0–8.3)

## 2021-03-04 LAB — LIPID PANEL
Cholesterol: 167 mg/dL (ref 0–200)
HDL: 32.8 mg/dL — ABNORMAL LOW (ref >39.00)
LDL Cholesterol: 122 mg/dL — ABNORMAL HIGH (ref 0–99)
NonHDL: 134.07
Total CHOL/HDL Ratio: 5
Triglycerides: 62 mg/dL (ref 0.0–149.0)
VLDL: 12.4 mg/dL (ref 0.0–40.0)

## 2021-03-04 MED ORDER — BENZONATATE 200 MG PO CAPS: 200.0000 mg | ORAL_CAPSULE | Freq: Two times a day (BID) | ORAL | 0 refills | Status: DC | PRN

## 2021-03-04 MED ORDER — PANTOPRAZOLE SODIUM 40 MG PO TBEC: 40.0000 mg | DELAYED_RELEASE_TABLET | Freq: Every day | ORAL | 1 refills | Status: DC

## 2021-03-04 NOTE — Patient Instructions (Addendum)
Give us 2-3 business days to get the results of your labs back.  ° °Keep the diet clean and stay active. ° °I recommend getting the updated bivalent covid vaccination booster at your convenience.  ° °Let us know if you need anything. ° °EXERCISES  °RANGE OF MOTION (ROM) AND STRETCHING EXERCISES - Low Back Pain °Most people with lower back pain will find that their symptoms get worse with excessive bending forward (flexion) or arching at the lower back (extension). The exercises that will help resolve your symptoms will focus on the opposite motion.  °If you have pain, numbness or tingling which travels down into your buttocks, leg or foot, the goal of the therapy is for these symptoms to move closer to your back and eventually resolve. Sometimes, these leg symptoms will get better, but your lower back pain may worsen. This is often an indication of progress in your rehabilitation. Be very alert to any changes in your symptoms and the activities in which you participated in the 24 hours prior to the change. Sharing this information with your caregiver will allow him or her to most efficiently treat your condition. °These exercises may help you when beginning to rehabilitate your injury. Your symptoms may resolve with or without further involvement from your physician, physical therapist or athletic trainer. While completing these exercises, remember:  °Restoring tissue flexibility helps normal motion to return to the joints. This allows healthier, less painful movement and activity. °An effective stretch should be held for at least 30 seconds. °A stretch should never be painful. You should only feel a gentle lengthening or release in the stretched tissue. °FLEXION RANGE OF MOTION AND STRETCHING EXERCISES: ° °STRETCH - Flexion, Single Knee to Chest  °Lie on a firm bed or floor with both legs extended in front of you. °Keeping one leg in contact with the floor, bring your opposite knee to your chest. Hold your leg in  place by either grabbing behind your thigh or at your knee. °Pull until you feel a gentle stretch in your low back. Hold 30 seconds. °Slowly release your grasp and repeat the exercise with the opposite side. °Repeat 2 times. Complete this exercise 3 times per week.  ° °STRETCH - Flexion, Double Knee to Chest °Lie on a firm bed or floor with both legs extended in front of you. °Keeping one leg in contact with the floor, bring your opposite knee to your chest. °Tense your stomach muscles to support your back and then lift your other knee to your chest. Hold your legs in place by either grabbing behind your thighs or at your knees. °Pull both knees toward your chest until you feel a gentle stretch in your low back. Hold 30 seconds. °Tense your stomach muscles and slowly return one leg at a time to the floor. °Repeat 2 times. Complete this exercise 3 times per week.  ° °STRETCH - Low Trunk Rotation °Lie on a firm bed or floor. Keeping your legs in front of you, bend your knees so they are both pointed toward the ceiling and your feet are flat on the floor. °Extend your arms out to the side. This will stabilize your upper body by keeping your shoulders in contact with the floor. °Gently and slowly drop both knees together to one side until you feel a gentle stretch in your low back. Hold for 30 seconds. °Tense your stomach muscles to support your lower back as you bring your knees back to the starting position. Repeat the   exercise to the other side. °Repeat 2 times. Complete this exercise at least 3 times per week. °  °EXTENSION RANGE OF MOTION AND FLEXIBILITY EXERCISES: ° °STRETCH - Extension, Prone on Elbows  °Lie on your stomach on the floor, a bed will be too soft. Place your palms about shoulder width apart and at the height of your head. °Place your elbows under your shoulders. If this is too painful, stack pillows under your chest. °Allow your body to relax so that your hips drop lower and make contact more  completely with the floor. °Hold this position for 30 seconds. °Slowly return to lying flat on the floor. °Repeat 2 times. Complete this exercise 3 times per week.  ° °RANGE OF MOTION - Extension, Prone Press Ups °Lie on your stomach on the floor, a bed will be too soft. Place your palms about shoulder width apart and at the height of your head. °Keeping your back as relaxed as possible, slowly straighten your elbows while keeping your hips on the floor. You may adjust the placement of your hands to maximize your comfort. As you gain motion, your hands will come more underneath your shoulders. °Hold this position 30 seconds. °Slowly return to lying flat on the floor. °Repeat 2 times. Complete this exercise 3 times per week.  ° °RANGE OF MOTION- Quadruped, Neutral Spine  °Assume a hands and knees position on a firm surface. Keep your hands under your shoulders and your knees under your hips. You may place padding under your knees for comfort. °Drop your head and point your tailbone toward the ground below you. This will round out your lower back like an angry cat. Hold this position for 30 seconds. °Slowly lift your head and release your tail bone so that your back sags into a large arch, like an old horse. °Hold this position for 30 seconds. °Repeat this until you feel limber in your low back. °Now, find your "sweet spot." This will be the most comfortable position somewhere between the two previous positions. This is your neutral spine. Once you have found this position, tense your stomach muscles to support your low back. °Hold this position for 30 seconds. °Repeat 2 times. Complete this exercise 3 times per week.  ° °STRENGTHENING EXERCISES - Low Back Sprain °These exercises may help you when beginning to rehabilitate your injury. These exercises should be done near your "sweet spot." This is the neutral, low-back arch, somewhere between fully rounded and fully arched, that is your least painful position. When  performed in this safe range of motion, these exercises can be used for people who have either a flexion or extension based injury. These exercises may resolve your symptoms with or without further involvement from your physician, physical therapist or athletic trainer. While completing these exercises, remember:  °Muscles can gain both the endurance and the strength needed for everyday activities through controlled exercises. °Complete these exercises as instructed by your physician, physical therapist or athletic trainer. Increase the resistance and repetitions only as guided. °You may experience muscle soreness or fatigue, but the pain or discomfort you are trying to eliminate should never worsen during these exercises. If this pain does worsen, stop and make certain you are following the directions exactly. If the pain is still present after adjustments, discontinue the exercise until you can discuss the trouble with your caregiver. ° °STRENGTHENING - Deep Abdominals, Pelvic Tilt  °Lie on a firm bed or floor. Keeping your legs in front of you, bend your   knees so they are both pointed toward the ceiling and your feet are flat on the floor. °Tense your lower abdominal muscles to press your low back into the floor. This motion will rotate your pelvis so that your tail bone is scooping upwards rather than pointing at your feet or into the floor. °With a gentle tension and even breathing, hold this position for 3 seconds. °Repeat 2 times. Complete this exercise 3 times per week.  ° °STRENGTHENING - Abdominals, Crunches  °Lie on a firm bed or floor. Keeping your legs in front of you, bend your knees so they are both pointed toward the ceiling and your feet are flat on the floor. Cross your arms over your chest. °Slightly tip your chin down without bending your neck. °Tense your abdominals and slowly lift your trunk high enough to just clear your shoulder blades. Lifting higher can put excessive stress on the lower  back and does not further strengthen your abdominal muscles. °Control your return to the starting position. °Repeat 2 times. Complete this exercise 3 times per week.  ° °STRENGTHENING - Quadruped, Opposite UE/LE Lift  °Assume a hands and knees position on a firm surface. Keep your hands under your shoulders and your knees under your hips. You may place padding under your knees for comfort. °Find your neutral spine and gently tense your abdominal muscles so that you can maintain this position. Your shoulders and hips should form a rectangle that is parallel with the floor and is not twisted. °Keeping your trunk steady, lift your right hand no higher than your shoulder and then your left leg no higher than your hip. Make sure you are not holding your breath. Hold this position for 30 seconds. °Continuing to keep your abdominal muscles tense and your back steady, slowly return to your starting position. Repeat with the opposite arm and leg. °Repeat 2 times. Complete this exercise 3 times per week.  ° °STRENGTHENING - Abdominals and Quadriceps, Straight Leg Raise  °Lie on a firm bed or floor with both legs extended in front of you. °Keeping one leg in contact with the floor, bend the other knee so that your foot can rest flat on the floor. °Find your neutral spine, and tense your abdominal muscles to maintain your spinal position throughout the exercise. °Slowly lift your straight leg off the floor about 6 inches for a count of 3, making sure to not hold your breath. °Still keeping your neutral spine, slowly lower your leg all the way to the floor. °Repeat this exercise with each leg 2 times. Complete this exercise 3 times per week. ° °POSTURE AND BODY MECHANICS CONSIDERATIONS - Low Back Sprain °Keeping correct posture when sitting, standing or completing your activities will reduce the stress put on different body tissues, allowing injured tissues a chance to heal and limiting painful experiences. The following are  general guidelines for improved posture.  While reading these guidelines, remember: °The exercises prescribed by your provider will help you have the flexibility and strength to maintain correct postures. °The correct posture provides the best environment for your joints to work. All of your joints have less wear and tear when properly supported by a spine with good posture. This means you will experience a healthier, less painful body. °Correct posture must be practiced with all of your activities, especially prolonged sitting and standing. Correct posture is as important when doing repetitive low-stress activities (typing) as it is when doing a single heavy-load activity (lifting). ° °RESTING POSITIONS °  Consider which positions are most painful for you when choosing a resting position. If you have pain with flexion-based activities (sitting, bending, stooping, squatting), choose a position that allows you to rest in a less flexed posture. You would want to avoid curling into a fetal position on your side. If your pain worsens with extension-based activities (prolonged standing, working overhead), avoid resting in an extended position such as sleeping on your stomach. Most people will find more comfort when they rest with their spine in a more neutral position, neither too rounded nor too arched. Lying on a non-sagging bed on your side with a pillow between your knees, or on your back with a pillow under your knees will often provide some relief. Keep in mind, being in any one position for a prolonged period of time, no matter how correct your posture, can still lead to stiffness. ° °PROPER SITTING POSTURE °In order to minimize stress and discomfort on your spine, you must sit with correct posture. Sitting with good posture should be effortless for a healthy body. Returning to good posture is a gradual process. Many people can work toward this most comfortably by using various supports until they have the  flexibility and strength to maintain this posture on their own. °When sitting with proper posture, your ears will fall over your shoulders and your shoulders will fall over your hips. You should use the back of the chair to support your upper back. Your lower back will be in a neutral position, just slightly arched. You may place a small pillow or folded towel at the base of your lower back for  °support.  °When working at a desk, create an environment that supports good, upright posture. Without extra support, muscles tire, which leads to excessive strain on joints and other tissues. Keep these recommendations in mind: ° °CHAIR: °A chair should be able to slide under your desk when your back makes contact with the back of the chair. This allows you to work closely. °The chair's height should allow your eyes to be level with the upper part of your monitor and your hands to be slightly lower than your elbows. ° °BODY POSITION °Your feet should make contact with the floor. If this is not possible, use a foot rest. °Keep your ears over your shoulders. This will reduce stress on your neck and low back. ° °INCORRECT SITTING POSTURES  °If you are feeling tired and unable to assume a healthy sitting posture, do not slouch or slump. This puts excessive strain on your back tissues, causing more damage and pain. Healthier options include: °Using more support, like a lumbar pillow. °Switching tasks to something that requires you to be upright or walking. °Talking a brief walk. °Lying down to rest in a neutral-spine position. ° °PROLONGED STANDING WHILE SLIGHTLY LEANING FORWARD  °When completing a task that requires you to lean forward while standing in one place for a long time, place either foot up on a stationary 2-4 inch high object to help maintain the best posture. When both feet are on the ground, the lower back tends to lose its slight inward curve. If this curve flattens (or becomes too large), then the back and your  other joints will experience too much stress, tire more quickly, and can cause pain. ° °CORRECT STANDING POSTURES °Proper standing posture should be assumed with all daily activities, even if they only take a few moments, like when brushing your teeth. As in sitting, your ears should fall over   your shoulders and your shoulders should fall over your hips. You should keep a slight tension in your abdominal muscles to brace your spine. Your tailbone should point down to the ground, not behind your body, resulting in an over-extended swayback posture.  ° °INCORRECT STANDING POSTURES  °Common incorrect standing postures include a forward head, locked knees and/or an excessive swayback. °WALKING °Walk with an upright posture. Your ears, shoulders and hips should all line-up. ° °PROLONGED ACTIVITY IN A FLEXED POSITION °When completing a task that requires you to bend forward at your waist or lean over a low surface, try to find a way to stabilize 3 out of 4 of your limbs. You can place a hand or elbow on your thigh or rest a knee on the surface you are reaching across. This will provide you more stability, so that your muscles do not tire as quickly. By keeping your knees relaxed, or slightly bent, you will also reduce stress across your lower back. °CORRECT LIFTING TECHNIQUES ° °DO : °Assume a wide stance. This will provide you more stability and the opportunity to get as close as possible to the object which you are lifting. °Tense your abdominals to brace your spine. Bend at the knees and hips. Keeping your back locked in a neutral-spine position, lift using your leg muscles. Lift with your legs, keeping your back straight. °Test the weight of unknown objects before attempting to lift them. °Try to keep your elbows locked down at your sides in order get the best strength from your shoulders when carrying an object. ° ° °Always ask for help when lifting heavy or awkward objects. °INCORRECT LIFTING TECHNIQUES °DO NOT:   °Lock your knees when lifting, even if it is a small object. °Bend and twist. Pivot at your feet or move your feet when needing to change directions. °Assume that you can safely pick up even a paperclip without proper posture. ° °

## 2021-03-04 NOTE — Progress Notes (Signed)
Chief Complaint  Patient presents with   Annual Exam     Well Woman Christine Trujillo is here for a complete physical.   Her last physical was >1 year ago.  Current diet: in general, a "un-healthy" diet. Current exercise: active around house. Fatigue out of ordinary? No Seatbelt? Yes  Health Maintenance Pap/HPV- Yes Tetanus- Yes HIV screening- Yes Hep C screening- Yes  Over the past 4 yrs, pt reports she will throw up if she eats int eh AM. She will intermittently take omeprazole 20 mg once per day which does not significantly help. No pain, bleeding, nighttime awakenings, fevers.   Flu Daughter dx'd w flu A. She has had coughing and runny nose for 6 d. No fevers currently, some aches. No SOB, wheezing, ear pain/drainage, ST, nasal congestion.   Past Medical History:  Diagnosis Date   Anemia    during pregnancy   Anxiety    Depression    Gallstone    GERD (gastroesophageal reflux disease)    Gestational diabetes    glyburide   Headache    Migraine    Obesity    Paratubal cyst 02/19/2016     Past Surgical History:  Procedure Laterality Date   BIOPSY  10/22/2020   Procedure: BIOPSY;  Surgeon: Shellia Cleverly, DO;  Location: [facility];  Service: Gastroenterology;;   CHOLECYSTECTOMY N/A 12/02/2016   Procedure: LAPAROSCOPIC CHOLECYSTECTOMY WITH INTRAOPERATIVE CHOLANGIOGRAM;  Surgeon: Ovidio Kin, MD;  Location: [facility];  Service: General;  Laterality: N/A;   COLONOSCOPY WITH PROPOFOL N/A 10/22/2020   Procedure: COLONOSCOPY WITH PROPOFOL;  Surgeon: Shellia Cleverly, DO;  Location: [facility];  Service: Gastroenterology;  Laterality: N/A;   ESOPHAGOGASTRODUODENOSCOPY (EGD) WITH PROPOFOL N/A 10/22/2020   Procedure: ESOPHAGOGASTRODUODENOSCOPY (EGD) WITH PROPOFOL;  Surgeon: Shellia Cleverly, DO;  Location: [facility];  Service: Gastroenterology;  Laterality: N/A;   ESOPHAGOGASTRODUODENOSCOPY (EGD) WITH PROPOFOL N/A 10/22/2020   Procedure: ESOPHAGOGASTRODUODENOSCOPY  (EGD) WITH PROPOFOL;  Surgeon: Shellia Cleverly, DO;  Location: [facility];  Service: Gastroenterology;  Laterality: N/A;   WISDOM TOOTH EXTRACTION      Medications  Current Outpatient Medications on File Prior to Visit  Medication Sig Dispense Refill   Biotin w/ Vitamins C & E (HAIR/SKIN/NAILS PO) Take 1 tablet by mouth daily.     Multiple Vitamins-Minerals (MULTIVITAMIN WITH MINERALS) tablet Take 1 tablet by mouth daily.     Allergies No Known Allergies  Review of Systems: Constitutional:  no unexpected weight changes Eye:  no recent significant change in vision Ear/Nose/Mouth/Throat:  Ears:  no tinnitus or vertigo and no recent change in hearing Nose/Mouth/Throat:  no complaints of nasal congestion, no sore throat Cardiovascular: no chest pain Respiratory:  no cough and no shortness of breath Gastrointestinal:  no abdominal pain, no change in bowel habits; +vomiting GU:  Female: negative for dysuria or pelvic pain Musculoskeletal/Extremities:  no new/unexplained pain of the joints Integumentary (Skin/Breast):  no abnormal skin lesions reported Neurologic:  no headaches Endocrine:  denies fatigue Hematologic/Lymphatic:  No areas of easy bleeding  Exam BP 128/80   Pulse 84   Temp 98.2 F (36.8 C) (Oral)   Ht 5\' 3"  (1.6 m)   Wt 266 lb 4 oz (120.8 kg)   SpO2 99%   BMI 47.16 kg/m  General:  well developed, well nourished, in no apparent distress Skin:  no significant moles, warts, or growths Head:  no masses, lesions, or tenderness Eyes:  pupils equal and round, sclera anicteric without injection Ears:  canals without lesions, TMs shiny without retraction, no obvious effusion, no erythema Nose:  nares patent, septum midline, mucosa normal, and no drainage or sinus tenderness Throat/Pharynx:  lips and gingiva without lesion; tongue and uvula midline; non-inflamed pharynx; no exudates or postnasal drainage Neck: neck supple without adenopathy, thyromegaly, or  masses Lungs:  clear to auscultation, breath sounds equal bilaterally, no respiratory distress Cardio:  regular rate and rhythm, no bruits, no LE edema Abdomen:  abdomen soft, nontender; bowel sounds normal; no masses or organomegaly Genital: Defer to GYN Musculoskeletal:  symmetrical muscle groups noted without atrophy or deformity Extremities:  no clubbing, cyanosis, or edema, no deformities, no skin discoloration Neuro:  gait normal; deep tendon reflexes normal and symmetric Psych: well oriented with normal range of affect and appropriate judgment/insight  Assessment and Plan  Well adult exam - Plan: CBC, Comprehensive metabolic panel, Lipid panel  Gastroesophageal reflux disease, unspecified whether esophagitis present - Plan: pantoprazole (PROTONIX) 40 MG tablet  Flu-like symptoms - Plan: benzonatate (TESSALON) 200 MG capsule   Well 25 y.o. female. Counseled on diet and exercise. Other orders as above. GERD- chronic, unstable. Stop Prilosec, add Protonix 40 mg/d. Reflux precautions discussed. Follow up 6 weeks. Flu like s/s's: should pass soon, benzonatate prn.  The patient voiced understanding and agreement to the plan.  [name] [place], DO 03/04/21 11:20 AM

## 2021-03-09 ENCOUNTER — Other Ambulatory Visit (INDEPENDENT_AMBULATORY_CARE_PROVIDER_SITE_OTHER): Payer: Medicaid Other

## 2021-03-09 DIAGNOSIS — R739 Hyperglycemia, unspecified: Secondary | ICD-10-CM

## 2021-03-09 LAB — HEMOGLOBIN A1C: Hgb A1c MFr Bld: 5.5 % (ref 4.6–6.5)

## 2021-03-10 ENCOUNTER — Encounter: Payer: Self-pay | Admitting: Family Medicine

## 2021-03-10 ENCOUNTER — Ambulatory Visit (INDEPENDENT_AMBULATORY_CARE_PROVIDER_SITE_OTHER): Payer: Medicaid Other | Admitting: Family Medicine

## 2021-03-10 VITALS — BP 122/85 | HR 98 | Temp 98.4°F | Ht 63.0 in | Wt 266.0 lb

## 2021-03-10 DIAGNOSIS — R0981 Nasal congestion: Secondary | ICD-10-CM | POA: Diagnosis not present

## 2021-03-10 DIAGNOSIS — R059 Cough, unspecified: Secondary | ICD-10-CM

## 2021-03-10 DIAGNOSIS — J029 Acute pharyngitis, unspecified: Secondary | ICD-10-CM

## 2021-03-10 LAB — POC COVID19 BINAXNOW: SARS Coronavirus 2 Ag: NEGATIVE

## 2021-03-10 LAB — POCT INFLUENZA A/B
Influenza A, POC: NEGATIVE
Influenza B, POC: NEGATIVE

## 2021-03-10 LAB — POCT RAPID STREP A (OFFICE): Rapid Strep A Screen: NEGATIVE

## 2021-03-10 MED ORDER — AMOXICILLIN 875 MG PO TABS: 875.0000 mg | ORAL_TABLET | Freq: Two times a day (BID) | ORAL | 0 refills | Status: AC

## 2021-03-10 MED ORDER — PREDNISONE 20 MG PO TABS: 40.0000 mg | ORAL_TABLET | Freq: Every day | ORAL | 0 refills | Status: AC

## 2021-03-10 MED ORDER — FLUCONAZOLE 150 MG PO TABS: ORAL_TABLET | ORAL | 0 refills | Status: DC

## 2021-03-10 NOTE — Patient Instructions (Signed)
Continue to push fluids, practice good hand hygiene, and cover your mouth if you cough. ? ?If you start having fevers, shaking or shortness of breath, seek immediate care. ? ?OK to take Tylenol 1000 mg (2 extra strength tabs) or 975 mg (3 regular strength tabs) every 6 hours as needed. ? ?Let us know if you need anything. ?

## 2021-03-10 NOTE — Progress Notes (Signed)
Chief Complaint  Patient presents with   Sore Throat    Christine Trujillo here for URI complaints.  Duration: 2 weeks  Associated symptoms: sinus congestion, sore throat, shortness of breath, myalgia, and coughing Denies: sinus pain, rhinorrhea, ear pain, ear drainage, wheezing, and fevers Treatment to date: Theraflu, Tessalon Perles, Mucinex Sick contacts: Yes Did not test + for covid.   Past Medical History:  Diagnosis Date   Anemia    during pregnancy   Anxiety    Depression    Gallstone    GERD (gastroesophageal reflux disease)    Gestational diabetes    glyburide   Headache    Migraine    Obesity    Paratubal cyst 02/19/2016    Objective BP 122/85   Pulse 98   Temp 98.4 F (36.9 C) (Oral)   Ht 5\' 3"  (1.6 m)   Wt 266 lb (120.7 kg)   SpO2 99%   BMI 47.12 kg/m  General: Awake, alert, appears stated age HEENT: AT, [place], ears patent b/l and TM's neg, nares patent w/o discharge, pharynx slightly erythematous w R sided exudates, MMM Neck: No masses or asymmetry; +TTP over cerv LN's b/l Heart: RRR Lungs: CTAB, no accessory muscle use Psych: Age appropriate judgment and insight, normal mood and affect  Sore throat - Plan: amoxicillin (AMOXIL) 875 MG tablet  Sinus congestion  5 d pred burst 40 mg/d, bid amox use for 7 d to cover for strep. Tested neg for covid and flu.  Continue to push fluids, practice good hand hygiene, cover mouth when coughing. F/u prn. If starting to experience fevers, shaking, or shortness of breath, seek immediate care. Pt voiced understanding and agreement to the plan.  [place], DO 03/10/21 2:50 PM

## 2021-03-11 ENCOUNTER — Encounter (HOSPITAL_BASED_OUTPATIENT_CLINIC_OR_DEPARTMENT_OTHER): Payer: Self-pay | Admitting: Obstetrics & Gynecology

## 2021-03-12 ENCOUNTER — Encounter (HOSPITAL_BASED_OUTPATIENT_CLINIC_OR_DEPARTMENT_OTHER): Payer: Self-pay | Admitting: Obstetrics & Gynecology

## 2021-03-13 ENCOUNTER — Other Ambulatory Visit: Payer: Self-pay

## 2021-03-13 ENCOUNTER — Encounter (HOSPITAL_BASED_OUTPATIENT_CLINIC_OR_DEPARTMENT_OTHER): Payer: Self-pay | Admitting: Obstetrics & Gynecology

## 2021-03-13 NOTE — Progress Notes (Addendum)
ADDENDUM:  chart reviewed by anesthesia, Dr Joyce Copa, stated since pt symptoms started over a week ago and being treated let pt know dos she will be assessed to determine ok to proceed with surgery.   Called and left message for pt to get covid test today or 03-16-2021 am , requested call back.  ADDENDUM:  received call back from pt , address / directions given to get covid test done 03-13-2021 from 0800 to 1200 (pt unable to do test today) and verbalized understanding about anesthesia will assess dos if can proceed with surgery.   Spoke w/ via phone for pre-op interview--- pt Lab needs dos----  urine preg (per anes)/  pre-op orders pending             Lab results------ pt has negative covid/ flu/ strep results in epic dated 03-10-2021 COVID test ----- per pt started symptoms (cough/ runny nose) on 02-26-2021 when child had the flu. Then pt had pcp visit 03-10-2021 note in epic, symptoms of sinus congestion, sore throat, body aches, sob.  Pt given/ treated with antibiotic and prednisone, last dose's will be 03-15-2021. Will review w/ anesthesia. Arrive at ------- 0945 on 03-17-2021 NPO after MN NO Solid Food.  Clear liquids from MN until--- 0845 Med rec completed Medications to take morning of surgery ----- protonix Diabetic medication ----- n/a Patient instructed no nail polish to be worn day of surgery Patient instructed to bring photo id and insurance card day of surgery Patient aware to have Driver (ride ) / caregiver for 24 hours after surgery -- husband, joseph stine Patient Special Instructions ----- n/a Pre-Op special Istructions ----- sent inbox message to dr Erin Fulling in epic requested orders Patient verbalized understanding of instructions that were given at this phone interview. Patient denies shortness of breath, chest pain, fever, cough at this phone interview.

## 2021-03-16 ENCOUNTER — Other Ambulatory Visit: Payer: Self-pay | Admitting: Obstetrics & Gynecology

## 2021-03-16 LAB — SARS CORONAVIRUS 2 (TAT 6-24 HRS): SARS Coronavirus 2: NEGATIVE

## 2021-03-16 NOTE — Progress Notes (Signed)
Patient notified to arrive at 0845 and clear liquids only from MN til 0745. Patient is okay with this and voices understanding.

## 2021-03-17 ENCOUNTER — Ambulatory Visit (HOSPITAL_BASED_OUTPATIENT_CLINIC_OR_DEPARTMENT_OTHER)
Admission: RE | Admit: 2021-03-17 | Discharge: 2021-03-17 | Disposition: A | Discharge status: Home / Self Care | Payer: Medicaid Other | Attending: Obstetrics & Gynecology | Admitting: Obstetrics & Gynecology

## 2021-03-17 ENCOUNTER — Ambulatory Visit (HOSPITAL_BASED_OUTPATIENT_CLINIC_OR_DEPARTMENT_OTHER): Payer: Medicaid Other | Admitting: Certified Registered Nurse Anesthetist

## 2021-03-17 ENCOUNTER — Encounter (HOSPITAL_BASED_OUTPATIENT_CLINIC_OR_DEPARTMENT_OTHER)
Admission: RE | Disposition: A | Discharge status: Home / Self Care | Payer: Self-pay | Source: Home / Self Care | Attending: Obstetrics & Gynecology

## 2021-03-17 ENCOUNTER — Other Ambulatory Visit: Payer: Self-pay

## 2021-03-17 ENCOUNTER — Encounter (HOSPITAL_BASED_OUTPATIENT_CLINIC_OR_DEPARTMENT_OTHER): Payer: Self-pay | Admitting: Obstetrics & Gynecology

## 2021-03-17 DIAGNOSIS — N9489 Other specified conditions associated with female genital organs and menstrual cycle: Secondary | ICD-10-CM

## 2021-03-17 DIAGNOSIS — N83202 Unspecified ovarian cyst, left side: Secondary | ICD-10-CM | POA: Insufficient documentation

## 2021-03-17 DIAGNOSIS — Z302 Encounter for sterilization: Secondary | ICD-10-CM

## 2021-03-17 DIAGNOSIS — F17200 Nicotine dependence, unspecified, uncomplicated: Secondary | ICD-10-CM | POA: Diagnosis not present

## 2021-03-17 DIAGNOSIS — R102 Pelvic and perineal pain: Secondary | ICD-10-CM

## 2021-03-17 DIAGNOSIS — K219 Gastro-esophageal reflux disease without esophagitis: Secondary | ICD-10-CM | POA: Insufficient documentation

## 2021-03-17 DIAGNOSIS — N949 Unspecified condition associated with female genital organs and menstrual cycle: Secondary | ICD-10-CM

## 2021-03-17 DIAGNOSIS — N838 Other noninflammatory disorders of ovary, fallopian tube and broad ligament: Secondary | ICD-10-CM | POA: Diagnosis not present

## 2021-03-17 HISTORY — DX: Unspecified ovarian cyst, unspecified side: N83.209

## 2021-03-17 HISTORY — DX: Pelvic and perineal pain unspecified side: R10.20

## 2021-03-17 HISTORY — DX: Personal history of gestational diabetes: Z86.32

## 2021-03-17 HISTORY — DX: Pelvic and perineal pain: R10.2

## 2021-03-17 HISTORY — DX: Personal history of diseases of the blood and blood-forming organs and certain disorders involving the immune mechanism: Z86.2

## 2021-03-17 HISTORY — PX: LAPAROSCOPIC SALPINGO OOPHERECTOMY: SHX5927

## 2021-03-17 HISTORY — PX: LAPAROSCOPIC OVARIAN CYSTECTOMY: SHX6248

## 2021-03-17 HISTORY — DX: Other specified symptoms and signs involving the circulatory and respiratory systems: R09.89

## 2021-03-17 LAB — CBC
HCT: 42.7 % (ref 36.0–46.0)
Hemoglobin: 14 g/dL (ref 12.0–15.0)
MCH: 27.2 pg (ref 26.0–34.0)
MCHC: 32.8 g/dL (ref 30.0–36.0)
MCV: 82.9 fL (ref 80.0–100.0)
Platelets: 367 10*3/uL (ref 150–400)
RBC: 5.15 MIL/uL — ABNORMAL HIGH (ref 3.87–5.11)
RDW: 13.4 % (ref 11.5–15.5)
WBC: 11.7 10*3/uL — ABNORMAL HIGH (ref 4.0–10.5)
nRBC: 0 % (ref 0.0–0.2)

## 2021-03-17 LAB — POCT PREGNANCY, URINE: Preg Test, Ur: NEGATIVE

## 2021-03-17 SURGERY — EXCISION, CYST, OVARY, LAPAROSCOPIC
Anesthesia: General | Site: Abdomen | Laterality: Left

## 2021-03-17 MED ORDER — PROPOFOL 500 MG/50ML IV EMUL
INTRAVENOUS | Status: DC | PRN
  Administered 2021-03-17: 50 ug/kg/min via INTRAVENOUS

## 2021-03-17 MED ORDER — HYDROMORPHONE HCL 1 MG/ML IJ SOLN
0.2500 mg | INTRAMUSCULAR | Status: DC | PRN
  Administered 2021-03-17 (×2): 0.5 mg via INTRAVENOUS

## 2021-03-17 MED ORDER — PROPOFOL 10 MG/ML IV BOLUS
INTRAVENOUS | Status: DC | PRN
  Administered 2021-03-17: 200 mg via INTRAVENOUS

## 2021-03-17 MED ORDER — FENTANYL CITRATE (PF) 100 MCG/2ML IJ SOLN
INTRAMUSCULAR | Status: AC
  Filled 2021-03-17: qty 2

## 2021-03-17 MED ORDER — LIDOCAINE 2% (20 MG/ML) 5 ML SYRINGE
INTRAMUSCULAR | Status: DC | PRN
  Administered 2021-03-17: 100 mg via INTRAVENOUS

## 2021-03-17 MED ORDER — LIDOCAINE 2% (20 MG/ML) 5 ML SYRINGE
INTRAMUSCULAR | Status: AC
  Filled 2021-03-17: qty 5

## 2021-03-17 MED ORDER — DEXAMETHASONE SODIUM PHOSPHATE 10 MG/ML IJ SOLN
INTRAMUSCULAR | Status: AC
  Filled 2021-03-17: qty 1

## 2021-03-17 MED ORDER — BUPIVACAINE HCL (PF) 0.5 % IJ SOLN
INTRAMUSCULAR | Status: AC
  Filled 2021-03-17: qty 30

## 2021-03-17 MED ORDER — LACTATED RINGERS IV SOLN: INTRAVENOUS | Status: DC

## 2021-03-17 MED ORDER — SOD CITRATE-CITRIC ACID 500-334 MG/5ML PO SOLN: 30.0000 mL | ORAL | Status: DC

## 2021-03-17 MED ORDER — FENTANYL CITRATE (PF) 100 MCG/2ML IJ SOLN
INTRAMUSCULAR | Status: DC | PRN
  Administered 2021-03-17: 100 ug via INTRAVENOUS
  Administered 2021-03-17 (×3): 50 ug via INTRAVENOUS

## 2021-03-17 MED ORDER — HEMOSTATIC AGENTS (NO CHARGE) OPTIME
TOPICAL | Status: DC | PRN
  Administered 2021-03-17: 1 via TOPICAL

## 2021-03-17 MED ORDER — ROCURONIUM BROMIDE 10 MG/ML (PF) SYRINGE
PREFILLED_SYRINGE | INTRAVENOUS | Status: AC
  Filled 2021-03-17: qty 10

## 2021-03-17 MED ORDER — PROPOFOL 10 MG/ML IV BOLUS
INTRAVENOUS | Status: AC
  Filled 2021-03-17: qty 20

## 2021-03-17 MED ORDER — AMISULPRIDE (ANTIEMETIC) 5 MG/2ML IV SOLN
INTRAVENOUS | Status: AC
  Filled 2021-03-17: qty 4

## 2021-03-17 MED ORDER — KETOROLAC TROMETHAMINE 30 MG/ML IJ SOLN
INTRAMUSCULAR | Status: DC | PRN
  Administered 2021-03-17: 30 mg via INTRAVENOUS

## 2021-03-17 MED ORDER — MIDAZOLAM HCL 2 MG/2ML IJ SOLN
INTRAMUSCULAR | Status: DC | PRN
  Administered 2021-03-17: 2 mg via INTRAVENOUS

## 2021-03-17 MED ORDER — 0.9 % SODIUM CHLORIDE (POUR BTL) OPTIME
TOPICAL | Status: DC | PRN
  Administered 2021-03-17: 500 mL

## 2021-03-17 MED ORDER — HYDROMORPHONE HCL 1 MG/ML IJ SOLN
INTRAMUSCULAR | Status: AC
  Filled 2021-03-17: qty 1

## 2021-03-17 MED ORDER — ROCURONIUM BROMIDE 10 MG/ML (PF) SYRINGE
PREFILLED_SYRINGE | INTRAVENOUS | Status: DC | PRN
  Administered 2021-03-17: 50 mg via INTRAVENOUS
  Administered 2021-03-17: 20 mg via INTRAVENOUS

## 2021-03-17 MED ORDER — OXYCODONE-ACETAMINOPHEN 5-325 MG PO TABS: 1.0000 | ORAL_TABLET | Freq: Four times a day (QID) | ORAL | 0 refills | Status: DC | PRN

## 2021-03-17 MED ORDER — DEXAMETHASONE SODIUM PHOSPHATE 10 MG/ML IJ SOLN
INTRAMUSCULAR | Status: DC | PRN
  Administered 2021-03-17: 10 mg via INTRAVENOUS

## 2021-03-17 MED ORDER — PROPOFOL 500 MG/50ML IV EMUL
INTRAVENOUS | Status: AC
  Filled 2021-03-17: qty 50

## 2021-03-17 MED ORDER — ONDANSETRON HCL 4 MG/2ML IJ SOLN
INTRAMUSCULAR | Status: DC | PRN
  Administered 2021-03-17: 4 mg via INTRAVENOUS

## 2021-03-17 MED ORDER — MIDAZOLAM HCL 2 MG/2ML IJ SOLN
INTRAMUSCULAR | Status: AC
  Filled 2021-03-17: qty 2

## 2021-03-17 MED ORDER — BUPIVACAINE HCL (PF) 0.5 % IJ SOLN
INTRAMUSCULAR | Status: DC | PRN
  Administered 2021-03-17: 30 mL

## 2021-03-17 MED ORDER — SUGAMMADEX SODIUM 200 MG/2ML IV SOLN
INTRAVENOUS | Status: DC | PRN
  Administered 2021-03-17: 300 mg via INTRAVENOUS

## 2021-03-17 MED ORDER — ONDANSETRON HCL 4 MG/2ML IJ SOLN
INTRAMUSCULAR | Status: AC
  Filled 2021-03-17: qty 2

## 2021-03-17 MED ORDER — AMISULPRIDE (ANTIEMETIC) 5 MG/2ML IV SOLN
10.0000 mg | Freq: Once | INTRAVENOUS | Status: AC
  Administered 2021-03-17: 10 mg via INTRAVENOUS

## 2021-03-17 MED ORDER — IBUPROFEN 800 MG PO TABS: 800.0000 mg | ORAL_TABLET | Freq: Three times a day (TID) | ORAL | 0 refills | Status: DC | PRN

## 2021-03-17 MED ORDER — FENTANYL CITRATE (PF) 250 MCG/5ML IJ SOLN
INTRAMUSCULAR | Status: AC
  Filled 2021-03-17: qty 5

## 2021-03-17 SURGICAL SUPPLY — 44 items
ADH SKN CLS APL DERMABOND .7 (GAUZE/BANDAGES/DRESSINGS) ×2
APL SRG 38 LTWT LNG FL B (MISCELLANEOUS) ×2
APPLICATOR ARISTA FLEXITIP XL (MISCELLANEOUS) ×3 IMPLANT
BAG RETRIEVAL 10 (BASKET) ×1
CABLE HIGH FREQUENCY MONO STRZ (ELECTRODE) IMPLANT
CATH ROBINSON RED A/P 16FR (CATHETERS) IMPLANT
COVER MAYO STAND STRL (DRAPES) ×3 IMPLANT
DERMABOND ADVANCED (GAUZE/BANDAGES/DRESSINGS) ×1
DERMABOND ADVANCED .7 DNX12 (GAUZE/BANDAGES/DRESSINGS) ×2 IMPLANT
DRSG OPSITE POSTOP 3X4 (GAUZE/BANDAGES/DRESSINGS) IMPLANT
DURAPREP 26ML APPLICATOR (WOUND CARE) ×3 IMPLANT
GAUZE 4X4 16PLY ~~LOC~~+RFID DBL (SPONGE) ×3 IMPLANT
GLOVE SURG ENC MOIS LTX SZ7 (GLOVE) ×9 IMPLANT
GLOVE SURG UNDER POLY LF SZ7 (GLOVE) ×12 IMPLANT
GOWN STRL REUS W/TWL LRG LVL3 (GOWN DISPOSABLE) ×9 IMPLANT
HEMOSTAT ARISTA ABSORB 3G PWDR (HEMOSTASIS) ×3 IMPLANT
KIT TURNOVER CYSTO (KITS) ×3 IMPLANT
MANIPULATOR UTERINE 4.5 ZUMI (MISCELLANEOUS) ×3 IMPLANT
NEEDLE INSUFFLATION 120MM (ENDOMECHANICALS) ×3 IMPLANT
NS IRRIG 1000ML POUR BTL (IV SOLUTION) IMPLANT
NS IRRIG 500ML POUR BTL (IV SOLUTION) ×3 IMPLANT
PACK LAPAROSCOPY BASIN (CUSTOM PROCEDURE TRAY) ×3 IMPLANT
PACK TRENDGUARD 450 HYBRID PRO (MISCELLANEOUS) ×2 IMPLANT
PROTECTOR NERVE ULNAR (MISCELLANEOUS) IMPLANT
SET SUCTION IRRIG HYDROSURG (IRRIGATION / IRRIGATOR) IMPLANT
SET TRI-LUMEN FLTR TB AIRSEAL (TUBING) IMPLANT
SET TUBE SMOKE EVAC HIGH FLOW (TUBING) ×3 IMPLANT
SHEARS HARMONIC ACE PLUS 36CM (ENDOMECHANICALS) ×3 IMPLANT
SUT VIC AB 3-0 X1 27 (SUTURE) IMPLANT
SUT VIC AB 4-0 PS2 18 (SUTURE) ×3 IMPLANT
SUT VICRYL 0 UR6 27IN ABS (SUTURE) ×9 IMPLANT
SYR 10ML LL (SYRINGE) ×3 IMPLANT
SYR 50ML LL SCALE MARK (SYRINGE) ×6 IMPLANT
SYS BAG RETRIEVAL 10MM (BASKET) ×2
SYSTEM BAG RETRIEVAL 10MM (BASKET) ×2 IMPLANT
SYSTEM CARTER THOMASON II (TROCAR) ×6 IMPLANT
TOWEL OR 17X26 10 PK STRL BLUE (TOWEL DISPOSABLE) ×3 IMPLANT
TRAY FOLEY W/BAG SLVR 14FR LF (SET/KITS/TRAYS/PACK) ×3 IMPLANT
TRENDGUARD 450 HYBRID PRO PACK (MISCELLANEOUS) ×3
TROCAR BLADELESS OPT 5 100 (ENDOMECHANICALS) ×9 IMPLANT
TROCAR OPTI TIP 5M 100M (ENDOMECHANICALS) IMPLANT
TROCAR PORT AIRSEAL 5X120 (TROCAR) IMPLANT
TROCAR XCEL DIL TIP R 11M (ENDOMECHANICALS) ×3 IMPLANT
WARMER LAPAROSCOPE (MISCELLANEOUS) ×3 IMPLANT

## 2021-03-17 NOTE — Discharge Instructions (Signed)
Post Anesthesia Home Care Instructions  Activity: Get plenty of rest for the remainder of the day. A responsible adult should stay with you for 24 hours following the procedure.  For the next 24 hours, DO NOT: -Drive a car -Operate machinery -Drink alcoholic beverages -Take any medication unless instructed by your physician -Make any legal decisions or sign important papers.  Meals: Start with liquid foods such as gelatin or soup. Progress to regular foods as tolerated. Avoid greasy, spicy, heavy foods. If nausea and/or vomiting occur, drink only clear liquids until the nausea and/or vomiting subsides. Call your physician if vomiting continues.  Special Instructions/Symptoms: Your throat may feel dry or sore from the anesthesia or the breathing tube placed in your throat during surgery. If this causes discomfort, gargle with warm salt water. The discomfort should disappear within 24 hours.  If you had a scopolamine patch placed behind your ear for the management of post- operative nausea and/or vomiting:  1. The medication in the patch is effective for 72 hours, after which it should be removed.  Wrap patch in a tissue and discard in the trash. Wash hands thoroughly with soap and water. 2. You may remove the patch earlier than 72 hours if you experience unpleasant side effects which may include dry mouth, dizziness or visual disturbances. 3. Avoid touching the patch. Wash your hands with soap and water after contact with the patch.   DISCHARGE INSTRUCTIONS: Laparoscopy  The following instructions have been prepared to help you care for yourself upon your return home today.  Wound care: . Do not get the incision wet for the first 24 hours. The incision should be kept clean and dry. . The Band-Aids or dressings may be removed the day after surgery. . Should the incision become sore, red, and swollen after the first week, check with your doctor.  Personal hygiene: . Shower the day  after your procedure.  Activity and limitations: . Do NOT drive or operate any equipment today. . Do NOT lift anything more than 15 pounds for 2-3 weeks after surgery. . Do NOT rest in bed all day. . Walking is encouraged. Walk each day, starting slowly with 5-minute walks 3 or 4 times a day. Slowly increase the length of your walks. . Walk up and down stairs slowly. . Do NOT do strenuous activities, such as golfing, playing tennis, bowling, running, biking, weight lifting, gardening, mowing, or vacuuming for 2-4 weeks. Ask your doctor when it is okay to start.  Diet: Eat a light meal as desired this evening. You may resume your usual diet tomorrow.  Return to work: This is dependent on the type of work you do. For the most part you can return to a desk job within a week of surgery. If you are more active at work, please discuss this with your doctor.  What to expect after your surgery: You may have a slight burning sensation when you urinate on the first day. You may have a very small amount of blood in the urine. Expect to have a small amount of vaginal discharge/light bleeding for 1-2 weeks. It is not unusual to have abdominal soreness and bruising for up to 2 weeks. You may be tired and need more rest for about 1 week. You may experience shoulder pain for 24-72 hours. Lying flat in bed may relieve it.  Call your doctor for any of the following: . Develop a fever of 100.4 or greater . Inability to urinate 6 hours after discharge   from hospital . Severe pain not relieved by pain medications . Persistent of heavy bleeding at incision site . Redness or swelling around incision site after a week . Increasing nausea or vomiting  Patient Signature________________________________________ Nurse Signature_________________________________________ 

## 2021-03-17 NOTE — Anesthesia Preprocedure Evaluation (Addendum)
Anesthesia Evaluation  Patient identified by MRN, date of birth, ID band Patient awake    Reviewed: Allergy & Precautions, NPO status , Patient's Chart, lab work & pertinent test results  Airway Mallampati: II  TM Distance: >3 FB     Dental   Pulmonary Current Smoker and Patient abstained from smoking.,    breath sounds clear to auscultation       Cardiovascular negative cardio ROS   Rhythm:Regular Rate:Normal     Neuro/Psych PSYCHIATRIC DISORDERS    GI/Hepatic Neg liver ROS, GERD  ,  Endo/Other  negative endocrine ROS  Renal/GU negative Renal ROS     Musculoskeletal   Abdominal   Peds  Hematology   Anesthesia Other Findings   Reproductive/Obstetrics                             Anesthesia Physical Anesthesia Plan  ASA: 3  Anesthesia Plan: General   Post-op Pain Management:    Induction: Intravenous  PONV Risk Score and Plan: 3 and Ondansetron, Dexamethasone and Midazolam  Airway Management Planned: Oral ETT  Additional Equipment:   Intra-op Plan:   Post-operative Plan: Extubation in OR  Informed Consent: I have reviewed the patients History and Physical, chart, labs and discussed the procedure including the risks, benefits and alternatives for the proposed anesthesia with the patient or authorized representative who has indicated his/her understanding and acceptance.     Dental advisory given  Plan Discussed with: CRNA and Anesthesiologist  Anesthesia Plan Comments:        Anesthesia Quick Evaluation

## 2021-03-17 NOTE — Anesthesia Postprocedure Evaluation (Signed)
Anesthesia Post Note  Patient: Christine Trujillo  Procedure(s) Performed: LAPAROSCOPIC Resection of Adnexal Mass (Left: Abdomen) LAPAROSCOPIC BIALTERAL SALPINGECTOMY (Bilateral: Abdomen)     Patient location during evaluation: PACU Anesthesia Type: General Level of consciousness: awake Pain management: pain level controlled Vital Signs Assessment: post-procedure vital signs reviewed and stable Respiratory status: spontaneous breathing Cardiovascular status: stable Postop Assessment: no apparent nausea or vomiting Anesthetic complications: no   No notable events documented.  Last Vitals:  Vitals:   03/17/21 1245 03/17/21 1300  BP: (!) 143/82 137/82  Pulse: 95 82  Resp: (!) 26 10  Temp:    SpO2: 100% 100%    Last Pain:  Vitals:   03/17/21 1300  TempSrc:   PainSc: 7                  Alyrica Thurow

## 2021-03-17 NOTE — H&P (Signed)
Preoperative History and Physical  Christine Trujillo is a 25 y.o. G1P1001 here for surgical management of persistent pelvic cyst and undesired fertility.    Proposed surgery: Exploratory laparoscopy of the pelvis and bilateral salpingectomy.   Past Medical History:  Diagnosis Date   Anxiety    Depression    GERD (gastroesophageal reflux disease)    History of anemia    during pregnancy   History of gestational diabetes    Ovarian cyst    left   Pelvic pain    Upper respiratory symptom    symptoms started 02-16-2021 cough/ runny nose;  pcp visit 03-10-2021 with sore throat/ congestion/ sob/ body aches/ cough but no fever, negative covid/ flu/ strep resultsin epic  (03-13-2021  pt stated today only has nonproductive cough)   Past Surgical History:  Procedure Laterality Date   BIOPSY  10/22/2020   Procedure: BIOPSY;  Surgeon: Shellia Cleverly, DO;  Location: [facility];  Service: Gastroenterology;;   CHOLECYSTECTOMY N/A 12/02/2016   Procedure: LAPAROSCOPIC CHOLECYSTECTOMY WITH INTRAOPERATIVE CHOLANGIOGRAM;  Surgeon: Ovidio Kin, MD;  Location: [facility];  Service: General;  Laterality: N/A;   COLONOSCOPY WITH PROPOFOL N/A 10/22/2020   Procedure: COLONOSCOPY WITH PROPOFOL;  Surgeon: Shellia Cleverly, DO;  Location: [facility];  Service: Gastroenterology;  Laterality: N/A;   ESOPHAGOGASTRODUODENOSCOPY (EGD) WITH PROPOFOL N/A 10/22/2020   Procedure: ESOPHAGOGASTRODUODENOSCOPY (EGD) WITH PROPOFOL;  Surgeon: Shellia Cleverly, DO;  Location: [facility];  Service: Gastroenterology;  Laterality: N/A;   ESOPHAGOGASTRODUODENOSCOPY (EGD) WITH PROPOFOL N/A 10/22/2020   Procedure: ESOPHAGOGASTRODUODENOSCOPY (EGD) WITH PROPOFOL;  Surgeon: Shellia Cleverly, DO;  Location: [facility];  Service: Gastroenterology;  Laterality: N/A;   WISDOM TOOTH EXTRACTION     OB History     Gravida  1   Para  1   Term  1   Preterm      AB      Living  1      SAB      IAB      Ectopic       Multiple  0   Live Births  1          Patient denies any cervical dysplasia or STIs. Medications Prior to Admission  Medication Sig Dispense Refill Last Dose   Biotin w/ Vitamins C & E (HAIR/SKIN/NAILS PO) Take 1 tablet by mouth daily.   03/13/2021   fluconazole (DIFLUCAN) 150 MG tablet Take 1 tab, repeat in 72 hours if no improvement. 2 tablet 0 Past Week   medroxyPROGESTERone Acetate (DEPO-PROVERA IM) Inject into the muscle every 3 (three) months.      pantoprazole (PROTONIX) 40 MG tablet Take 1 tablet (40 mg total) by mouth daily. (Patient taking differently: Take 40 mg by mouth daily.) 30 tablet 1 03/17/2021 at 0540   benzonatate (TESSALON) 200 MG capsule Take 1 capsule (200 mg total) by mouth 2 (two) times daily as needed for cough. (Patient not taking: Reported on 03/13/2021) 20 capsule 0 Unknown   Multiple Vitamins-Minerals (MULTIVITAMIN WITH MINERALS) tablet Take 1 tablet by mouth daily.   Unknown    No Known Allergies Social History:   reports that she has been smoking cigarettes. She has never used smokeless tobacco. She reports current drug use. Drug: Marijuana. She reports that she does not drink alcohol. Family History  Problem Relation Age of Onset   Diabetes Mother    Heart disease Mother        CHF   Stomach cancer Brother  Colon cancer Neg Hx    Pancreatic cancer Neg Hx    Esophageal cancer Neg Hx    Liver disease Neg Hx     Review of Systems: Noncontributory  PHYSICAL EXAM: Blood pressure (!) 153/86, pulse 99, temperature 98.8 F (37.1 C), temperature source Oral, resp. rate 17, height 5\' 3"  (1.6 m), weight 120.8 kg, SpO2 97 %. General appearance - alert, well appearing, and in no distress Chest - clear to auscultation, no wheezes, rales or rhonchi, symmetric air entry Heart - normal rate and regular rhythm Abdomen - soft, nontender, nondistended, no masses or organomegaly Pelvic - see exam from office. Not repeated.   Extremities - peripheral pulses  normal, no pedal edema, no clubbing or cyanosis  Labs: Results for orders placed or performed during the hospital encounter of 03/17/21 (from the past 336 hour(s))  Pregnancy, urine POC   Collection Time: 03/17/21  9:16 AM  Result Value Ref Range   Preg Test, Ur NEGATIVE NEGATIVE  CBC   Collection Time: 03/17/21  9:51 AM  Result Value Ref Range   WBC 11.7 (H) 4.0 - 10.5 K/uL   RBC 5.15 (H) 3.87 - 5.11 MIL/uL   Hemoglobin 14.0 12.0 - 15.0 g/dL   HCT 14/06/22 56.4 - 33.2 %   MCV 82.9 80.0 - 100.0 fL   MCH 27.2 26.0 - 34.0 pg   MCHC 32.8 30.0 - 36.0 g/dL   RDW 95.1 88.4 - 16.6 %   Platelets 367 150 - 400 K/uL   nRBC 0.0 0.0 - 0.2 %  Results for orders placed or performed in visit on 03/16/21 (from the past 336 hour(s))  SARS Coronavirus 2 (TAT 6-24 hrs)   Collection Time: 03/16/21 12:00 AM  Result Value Ref Range   SARS Coronavirus 2 RESULT: NEGATIVE   Results for orders placed or performed in visit on 03/10/21 (from the past 336 hour(s))  POCT Influenza A/B   Collection Time: 03/10/21  3:13 PM  Result Value Ref Range   Influenza A, POC Negative Negative   Influenza B, POC Negative Negative  POCT rapid strep A   Collection Time: 03/10/21  3:13 PM  Result Value Ref Range   Rapid Strep A Screen Negative Negative  POC COVID-19   Collection Time: 03/10/21  3:13 PM  Result Value Ref Range   SARS Coronavirus 2 Ag Negative Negative  Results for orders placed or performed in visit on 03/09/21 (from the past 336 hour(s))  Hemoglobin A1c   Collection Time: 03/09/21  9:00 AM  Result Value Ref Range   Hgb A1c MFr Bld 5.5 4.6 - 6.5 %  Results for orders placed or performed in visit on 03/04/21 (from the past 336 hour(s))  CBC   Collection Time: 03/04/21  9:06 AM  Result Value Ref Range   WBC 8.0 4.0 - 10.5 K/uL   RBC 4.82 3.87 - 5.11 Mil/uL   Platelets 298.0 150.0 - 400.0 K/uL   Hemoglobin 13.1 12.0 - 15.0 g/dL   HCT 03/06/21 01.6 - 01.0 %   MCV 83.4 78.0 - 100.0 fl   MCHC 32.6 30.0 -  36.0 g/dL   RDW 93.2 35.5 - 73.2 %  Comprehensive metabolic panel   Collection Time: 03/04/21  9:06 AM  Result Value Ref Range   Sodium 138 135 - 145 mEq/L   Potassium 3.7 3.5 - 5.1 mEq/L   Chloride 105 96 - 112 mEq/L   CO2 24 19 - 32 mEq/L   Glucose, Bld 114 (H)  70 - 99 mg/dL   BUN 8 6 - 23 mg/dL   Creatinine, Ser 1.61 0.40 - 1.20 mg/dL   Total Bilirubin 0.8 0.2 - 1.2 mg/dL   Alkaline Phosphatase 86 39 - 117 U/L   AST 12 0 - 37 U/L   ALT 10 0 - 35 U/L   Total Protein 7.1 6.0 - 8.3 g/dL   Albumin 4.3 3.5 - 5.2 g/dL   GFR 09.60 >45.40 mL/min   Calcium 9.0 8.4 - 10.5 mg/dL  Lipid panel   Collection Time: 03/04/21  9:06 AM  Result Value Ref Range   Cholesterol 167 0 - 200 mg/dL   Triglycerides 98.1 0.0 - 149.0 mg/dL   HDL 19.14 (L) >78.29 mg/dL   VLDL 56.2 0.0 - 13.0 mg/dL   LDL Cholesterol 865 (H) 0 - 99 mg/dL   Total CHOL/HDL Ratio 5    NonHDL 134.07     Imaging Studies: No results found.12/02/2020 12/02/2020 CLINICAL DATA:  Left ovarian cyst   EXAM: TRANSABDOMINAL ULTRASOUND OF PELVIS   DOPPLER ULTRASOUND OF OVARIES   TECHNIQUE: Transabdominal ultrasound examination of the pelvis was performed including evaluation of the uterus, ovaries, adnexal regions, and pelvic cul-de-sac.   Color and duplex Doppler ultrasound was utilized to evaluate blood flow to the ovaries.   COMPARISON:  CT 11/09/2016, 12/02/2020   FINDINGS: Uterus   Measurements: 7.5 x 4.0 x 4.6 cm = volume: 73 mL. No fibroids or other mass visualized.   Endometrium   Thickness: 5 mm.  No focal abnormality visualized.   Right ovary   Measurements: 2.9 x 1.7 x 2.1 cm = volume: 5 mL. Normal appearance/no adnexal mass.   Left ovary   Measurements: 5.0 x 2.0 x 2.2 cm = volume: 11 mL. Normal appearance/no adnexal mass.   Pulsed Doppler evaluation demonstrates normal low-resistance arterial and venous waveforms in both ovaries.   Other: The cystic mass identified within the pelvis on recent  CT examination represents a simple unilocular cyst measuring 10.8 x 10.5 x 6.0 cm in greatest dimension. This is containing no internal debris vascularity, mural nodularity, or septation. 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 In review of the remote prior CT examination of 11/09/2016, this as slightly decreased in size, where this was seen to measure 9 x 14 x 16 cm in dimension.   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 This appears decreased in size from remote CT examination of 11/09/2016 and is safely considered benign.  Assessment: Patient Active Problem List   Diagnosis Date Noted   Pelvic pain in female 03/17/2021   Request for sterilization 03/17/2021   Gastroesophageal reflux disease 03/04/2021   Pain of upper abdomen    Non-intractable vomiting    Family history- stomach cancer    Diarrhea    Change in bowel habits    Depression, major, single episode, mild (HCC) 01/23/2020   Capsulitis of right shoulder 10/05/2019   GAD (generalized anxiety disorder) 04/20/2019   Dermatitis 05/10/2018   Closed fracture of tooth 05/10/2018   Chronic midline low back pain without sciatica 07/21/2016   BMI 45.0-49.9, adult (HCC) 04/22/2016   Depression, recurrent (HCC) 04/22/2016   Cyst of ovary 03/25/2016    Plan: Patient will undergo surgical management with exploratory laparoscopy of the pelvis and bilateral salpingectomy. Possible ovarian cystectomy. The risks of surgery were discussed in detail with  the patient including but not limited to: bleeding which may require transfusion or reoperation; infection which may require antibiotics; injury to surrounding organs which may involve bowel, bladder, ureters ; need for additional procedures including laparoscopy or laparotomy; thromboembolic phenomenon,  surgical site problems and other postoperative/anesthesia complications. Likelihood of success in alleviating the patient's condition was discussed. Routine postoperative instructions will be reviewed with the patient and her family in detail after surgery.  The patient concurred with the proposed plan, giving informed written consent for the surgery.  Patient has been NPO since last night she will remain NPO for procedure.  Anesthesia and OR aware.  Preoperative prophylactic antibiotics and SCDs ordered on call to the OR.  To OR when ready.  Jelesa Mangini L. Erin Fulling, M.D., [facility] 03/17/2021 10:36 AM

## 2021-03-17 NOTE — Transfer of Care (Signed)
Immediate Anesthesia Transfer of Care Note  Patient: Christine Trujillo  Procedure(s) Performed: LAPAROSCOPIC Resection of Adnexal Mass (Left: Abdomen) LAPAROSCOPIC BIALTERAL SALPINGECTOMY (Bilateral: Abdomen)  Patient Location: PACU  Anesthesia Type:General  Level of Consciousness: awake, alert  and oriented  Airway & Oxygen Therapy: Patient Spontanous Breathing and Patient connected to face mask oxygen  Post-op Assessment: Report given to RN and Post -op Vital signs reviewed and stable  Post vital signs: Reviewed and stable  Last Vitals:  Vitals Value Taken Time  BP 141/88 03/17/21 1238  Temp    Pulse 93 03/17/21 1241  Resp 29 03/17/21 1241  SpO2 100 % 03/17/21 1241  Vitals shown include unvalidated device data.  Last Pain:  Vitals:   03/17/21 0904  TempSrc: Oral  PainSc: 0-No pain      Patients Stated Pain Goal: 6 (03/17/21 0904)  Complications: No notable events documented.

## 2021-03-17 NOTE — Brief Op Note (Signed)
03/17/2021  1:05 PM  PATIENT:  Christine Trujillo  25 y.o. female  PRE-OPERATIVE DIAGNOSIS:  Left Ovarian Cyst Pelvic Pain Undesired Fertility  POST-OPERATIVE DIAGNOSIS:  Left Ovarian Cyst Pelvic Pain  PROCEDURE:  Procedure(s): LAPAROSCOPIC Resection of Adnexal Mass (Left) LAPAROSCOPIC BIALTERAL SALPINGECTOMY (Bilateral)  SURGEON:  Surgeon(s) and Role:    * Willodean Rosenthal, MD - Primary    * Jerene Bears, MD - Assisting  ANESTHESIA:   local and general  EBL:  10 mL   BLOOD ADMINISTERED:none  DRAINS: none   LOCAL MEDICATIONS USED:  MARCAINE     SPECIMEN:  Source of Specimen:  left fallopian tube with cyst and right fallopian tube.   DISPOSITION OF SPECIMEN:  PATHOLOGY  COUNTS:  YES  TOURNIQUET:  * No tourniquets in log *  DICTATION: .Note written in EPIC  PLAN OF CARE: Discharge to home after PACU  PATIENT DISPOSITION:  PACU - hemodynamically stable.   Delay start of Pharmacological VTE agent (>24hrs) due to surgical blood loss or risk of bleeding: not applicable  Complications: none immediate.   Kazuto Sevey L. Harraway-Smith, M.D., [facility]

## 2021-03-17 NOTE — Op Note (Signed)
03/17/2021  1:05 PM  PATIENT:  Christine Trujillo  25 y.o. female  PRE-OPERATIVE DIAGNOSIS:  pelvic cyst; Pelvic Pain; Undesired Fertility  POST-OPERATIVE DIAGNOSIS:  Left Ovarian Cyst Pelvic Pain; undesired fertility.   PROCEDURE:  Procedure(s): LAPAROSCOPIC Resection of Adnexal Mass (Left) LAPAROSCOPIC BIALTERAL SALPINGECTOMY (Bilateral)  SURGEON:  Surgeon(s) and Role:    * Willodean Rosenthal, MD - Primary    * Jerene Bears, MD - Assisting  ANESTHESIA:   local and general  EBL:  10 mL   BLOOD ADMINISTERED:none  DRAINS: none   LOCAL MEDICATIONS USED:  MARCAINE     SPECIMEN:  Source of Specimen:  left fallopian tube with cyst and right fallopian tube.   DISPOSITION OF SPECIMEN:  PATHOLOGY  COUNTS:  YES  TOURNIQUET:  * No tourniquets in log *  DICTATION: .Note written in EPIC  PLAN OF CARE: Discharge to home after PACU  PATIENT DISPOSITION:  PACU - hemodynamically stable.   Delay start of Pharmacological VTE agent (>24hrs) due to surgical blood loss or risk of bleeding: not applicable  Complications: none immediate.  IINDICATIONS: 25 y.o. G1P1001 with pelvic pain and cyst in pelvis. Pt also reports undesired fertility, desires permanent sterilization. Other reversible forms of contraception were discussed with patient; she declines all other modalities.  Risks of procedure discussed with patient including bleeding, infection, injury to surrounding organs and need for additional procedures including laparotomy.  Also discussed permanence of method and risk of regret. Written informed consent was obtained.    FINDINGS:  Normal uterus, Left fallopian tube has large paratubal cyst attached to it. The right fallopian tube was normal.  Normal ovaries bilaterally.   TECHNIQUE:  The patient was taken to the operating room where general anesthesia was obtained without difficulty.  She was then placed in the dorsal lithotomy position and prepared and draped in sterile  fashion.  After an adequate timeout was performed, a bivalved speculum was then placed in the patient's vagina, and the anterior lip of cervix grasped with the single-tooth tenaculum.  The uterine manipulator was then advanced into the uterus. A Foley catheter was placed. The speculum was removed from the vagina.  Attention was then turned to the patient's abdomen where a 5-mm skin incision was made in the left upper quadrant. A trocar was placed through an Optiview trocar under direct visualization into the abdomen.  The abdomen was then insufflated with carbon dioxide gas.  Adequate pneumoperitoneum was obtained.  A survey of the patient's pelvis and abdomen revealed the findings above.  Bilateral 5-mm lower quadrant ports  were then placed under direct visualization.  A needle was placed through the left lower quadrant and the cyst was drained of 360 cc of clear fluid. The left fallopian tube with the cyst was then transected from the uterine attachments and the underlying mesosalpinx with the Harmonic device. The right side was done in similar fashion allowing for bilateral salpingectomy.  The right lower port was extended and a 10cc trocar was placed and an Endocatch bag was placed and the fallopian tubes and the cyst were removed from the abdomen under direct visualization.  The operative site was surveyed, and it was found to be hemostatic.   No intraoperative injury to other surrounding organs was noted.  The abdomen was desufflated and all instruments were then removed from the patient's abdomen.  The right lower quadrant fascial incision was closed with a Carter-Thompson instrument and 0 Vicryl. All skin incisions were closed with 3-0 vicryl. Marland Kitchen5% Marcaine  was injected into the port sites for a total of 30cc. They were also covered with steristrips and benzoin .  The uterine manipulator was removed from the vagina without complications. The foley catheter was also placed. The patient tolerated the  procedure well.  Sponge, lap, and needle counts were correct times two.  The patient was then taken to the recovery room awake, extubated and in stable condition.  An experienced assistant was required given the standard of surgical care given the complexity of the case.  This assistant was needed for exposure, dissection, suctioning, retraction, instrument exchange, and for overall help during the procedure.  The patient will be discharged to home as per PACU criteria.  Routine postoperative instructions given.  She was prescribed Percocet and Motrin.  She will follow up in the clinic in 2 weeks for postoperative evaluation.  Jarel Cuadra L. Harraway-Smith, M.D., [place]

## 2021-03-17 NOTE — Anesthesia Procedure Notes (Signed)
Procedure Name: Intubation Date/Time: 03/17/2021 10:59 AM Performed by: Pearson Grippe, CRNA Pre-anesthesia Checklist: Patient identified, Emergency Drugs available, Suction available and Patient being monitored Patient Re-evaluated:Patient Re-evaluated prior to induction Oxygen Delivery Method: Circle system utilized Preoxygenation: Pre-oxygenation with 100% oxygen Induction Type: IV induction Ventilation: Mask ventilation without difficulty Laryngoscope Size: Miller and 2 Grade View: Grade I Tube type: Oral Tube size: 7.0 mm Number of attempts: 1 Airway Equipment and Method: Stylet and Oral airway Placement Confirmation: ETT inserted through vocal cords under direct vision, positive ETCO2 and breath sounds checked- equal and bilateral Secured at: 21 cm Tube secured with: Tape Dental Injury: Teeth and Oropharynx as per pre-operative assessment

## 2021-03-18 ENCOUNTER — Encounter (HOSPITAL_BASED_OUTPATIENT_CLINIC_OR_DEPARTMENT_OTHER): Payer: Self-pay | Admitting: Obstetrics & Gynecology

## 2021-03-19 LAB — SURGICAL PATHOLOGY

## 2021-03-30 ENCOUNTER — Other Ambulatory Visit: Payer: Self-pay

## 2021-03-30 ENCOUNTER — Ambulatory Visit (INDEPENDENT_AMBULATORY_CARE_PROVIDER_SITE_OTHER): Payer: Medicaid Other | Admitting: Obstetrics & Gynecology

## 2021-03-30 ENCOUNTER — Encounter: Payer: Self-pay | Admitting: Obstetrics & Gynecology

## 2021-03-30 VITALS — BP 134/83 | HR 80 | Wt 269.0 lb

## 2021-03-30 DIAGNOSIS — N838 Other noninflammatory disorders of ovary, fallopian tube and broad ligament: Secondary | ICD-10-CM

## 2021-03-30 NOTE — Progress Notes (Signed)
History:  25 y.o.LMP here today for 2 week post op check.Pt is s/p bilateral salpingectomy with resection of large paratubal cyst on 03/17/2021.  Pt reports that she is doing well. She is eating and passing stools without difficulty.   She denies pain.   She feels ready to return to work.   The following portions of the patient's history were reviewed and updated as appropriate: allergies, current medications, past family history, past medical history, past social history, past surgical history and problem list.  Review of Systems:  Pertinent items are noted in HPI.    Objective:  Physical Exam BP 134/83    Pulse 80    Wt 269 lb (122 kg)    BMI 47.65 kg/m   CONSTITUTIONAL: Well-developed, well-nourished female in no acute distress.  HENT:  Normocephalic, atraumatic EYES: Conjunctivae and EOM are normal. No scleral icterus.  NECK: Normal range of motion SKIN: Skin is warm and dry. No rash noted. Not diaphoretic.No pallor. NEUROLGIC: Alert and oriented to person, place, and time. Normal coordination.  Abd: Soft, nontender and nondistended; her port sites are healing well.  Pelvic: deferred  Labs and Imaging Surg path 03/17/2021 FALLOPIAN TUBES, BILATERAL, AND LEFT OVARIAN CYST WALL, RESECTION:  - Benign serous cyst.  - Unremarkable fallopian tubes.  - No evidence of borderline change or malignancy.  - See comment.   COMMENT:  The differential includes a benign serous cystoadenoma with a benign  paratubal cyst.    Assessment & Plan:  2 week post op check following bilateral salpingectomy with removal of paratubal cyst.   Doing well  Reviewed her surg path.   Reviewed post op instructions and activities  Gradual increase in activities  F/u in 1 year or sooner prn  All questions answered.   Joelee Snoke L. Harraway-Smith, M.D., [place]

## 2021-04-10 ENCOUNTER — Ambulatory Visit: Payer: Medicaid Other

## 2021-05-11 ENCOUNTER — Telehealth (INDEPENDENT_AMBULATORY_CARE_PROVIDER_SITE_OTHER): Payer: Medicaid Other | Admitting: Family Medicine

## 2021-05-11 ENCOUNTER — Encounter: Payer: Self-pay | Admitting: Family Medicine

## 2021-05-11 DIAGNOSIS — S82892A Other fracture of left lower leg, initial encounter for closed fracture: Secondary | ICD-10-CM | POA: Diagnosis not present

## 2021-05-11 MED ORDER — OXYCODONE HCL 7.5 MG PO TABS: 7.5000 mg | ORAL_TABLET | Freq: Four times a day (QID) | ORAL | 0 refills | Status: DC | PRN

## 2021-05-11 NOTE — Progress Notes (Signed)
Chief Complaint  Patient presents with   broke ankle    Needs something other than crutches to walk.    Subjective: Patient is a 26 y.o. female here for f/u ankle fx. Due to COVID-19 pandemic, we are interacting via web portal for an electronic face-to-face visit. I verified patient's ID using 2 identifiers. Patient agreed to proceed with visit via this method. Patient is at home, I am at office. Patient and I are present for visit.   On 1/25, the patient slipped down her sloped path and fractured her left ankle.  She went to the emergency department was told she needs surgery.  The procedure is tomorrow.  She was given crutches which she has difficulty maneuvering with.  She is requesting something to help with mobility.  She has not reached out to the orthopedic office regarding this.  She has been taking Percocet 5-325 mg every 6 hours with little relief.  She is also using around 200 mg of ibuprofen and Tylenol and noting 10/10 pain.  Past Medical History:  Diagnosis Date   Anxiety    Depression    GERD (gastroesophageal reflux disease)    History of anemia    during pregnancy   History of gestational diabetes    Ovarian cyst    left   Pelvic pain    Upper respiratory symptom    symptoms started 02-16-2021 cough/ runny nose;  pcp visit 03-10-2021 with sore throat/ congestion/ sob/ body aches/ cough but no fever, negative covid/ flu/ strep resultsin epic  (03-13-2021  pt stated today only has nonproductive cough)    Objective: No conversational dyspnea Age appropriate judgment and insight Nml affect and mood  Assessment and Plan: Closed fracture of left ankle, initial encounter - Plan: oxyCODONE HCl 7.5 MG TABS  Defer DME supplies to orthopedic team.  We will slightly increase oxycodone dosage from 5 mg to 7.5 mg.  Ibuprofen and Tylenol scheduled.  She has surgery tomorrow. Total time: 12 minutes. The patient voiced understanding and agreement to the plan.  [place], DO 05/11/21  3:22 PM

## 2021-05-12 ENCOUNTER — Telehealth: Payer: Self-pay | Admitting: Family Medicine

## 2021-05-12 NOTE — Telephone Encounter (Signed)
Probably a moot point now since she had surgery this AM. Ty.

## 2021-05-12 NOTE — Telephone Encounter (Signed)
Pt states insurance does not cover oxycodone, but pharmacy was sending over a request for new rx.

## 2021-07-03 ENCOUNTER — Other Ambulatory Visit: Payer: Self-pay

## 2021-07-03 ENCOUNTER — Ambulatory Visit: Payer: Medicaid Other | Attending: Orthopedic Surgery | Admitting: Physical Therapy

## 2021-07-03 ENCOUNTER — Encounter: Payer: Self-pay | Admitting: Physical Therapy

## 2021-07-03 DIAGNOSIS — M6281 Muscle weakness (generalized): Secondary | ICD-10-CM | POA: Insufficient documentation

## 2021-07-03 DIAGNOSIS — M25572 Pain in left ankle and joints of left foot: Secondary | ICD-10-CM | POA: Diagnosis present

## 2021-07-03 DIAGNOSIS — R262 Difficulty in walking, not elsewhere classified: Secondary | ICD-10-CM | POA: Insufficient documentation

## 2021-07-03 NOTE — Therapy (Signed)
[place] [facility] ?[address] ?[place], [place], [address] ?Phone: [phone]   Fax:  [phone] ? ?Physical Therapy Evaluation ? ?Patient Details  ?Name: Christine Trujillo ?MRN: [id number] ?Date of Birth: 02/22/1996 ?Referring Provider (PT): Daiva HugeBabcock, Sharon ? ? ?Encounter Date: 07/03/2021 ? ? PT End of Session - 07/03/21 1428   ? ? Visit Number 1   ? Number of Visits 4   ? Date for PT Re-Evaluation 07/31/21   ? Authorization Type Medicaid [place]   ? Authorization - Visit Number 1   ? Authorization - Number of Visits 4   ? Progress Note Due on Visit 4   ? PT Start Time 1333   ? PT Stop Time 1415   ? PT Time Calculation (min) 42 min   ? Activity Tolerance Patient tolerated treatment well   ? Behavior During Therapy [facility] for tasks assessed/performed   ? ?  ?  ? ?  ? ? ?Past Medical History:  ?Diagnosis Date  ? Anxiety   ? Depression   ? GERD (gastroesophageal reflux disease)   ? History of anemia   ? during pregnancy  ? History of gestational diabetes   ? Ovarian cyst   ? left  ? Pelvic pain   ? Upper respiratory symptom   ? symptoms started 02-16-2021 cough/ runny nose;  pcp visit 03-10-2021 with sore throat/ congestion/ sob/ body aches/ cough but no fever, negative covid/ flu/ strep resultsin epic  (03-13-2021  pt stated today only has nonproductive cough)  ? ? ?Past Surgical History:  ?Procedure Laterality Date  ? BIOPSY  10/22/2020  ? Procedure: BIOPSY;  Surgeon: Shellia Cleverlyirigliano, Vito V, DO;  Location: [facility];  Service: Gastroenterology;;  ? CHOLECYSTECTOMY N/A 12/02/2016  ? Procedure: LAPAROSCOPIC CHOLECYSTECTOMY WITH INTRAOPERATIVE CHOLANGIOGRAM;  Surgeon: Ovidio KinNewman, David, MD;  Location: [facility];  Service: General;  Laterality: N/A;  ? COLONOSCOPY WITH PROPOFOL N/A 10/22/2020  ? Procedure: COLONOSCOPY WITH PROPOFOL;  Surgeon: Shellia Cleverlyirigliano, Vito V, DO;  Location: [facility];  Service: Gastroenterology;  Laterality: N/A;  ? ESOPHAGOGASTRODUODENOSCOPY (EGD) WITH PROPOFOL  N/A 10/22/2020  ? Procedure: ESOPHAGOGASTRODUODENOSCOPY (EGD) WITH PROPOFOL;  Surgeon: Shellia Cleverlyirigliano, Vito V, DO;  Location: [facility];  Service: Gastroenterology;  Laterality: N/A;  ? ESOPHAGOGASTRODUODENOSCOPY (EGD) WITH PROPOFOL N/A 10/22/2020  ? Procedure: ESOPHAGOGASTRODUODENOSCOPY (EGD) WITH PROPOFOL;  Surgeon: Shellia Cleverlyirigliano, Vito V, DO;  Location: [facility];  Service: Gastroenterology;  Laterality: N/A;  ? LAPAROSCOPIC OVARIAN CYSTECTOMY Left 03/17/2021  ? Procedure: LAPAROSCOPIC Resection of Adnexal Mass;  Surgeon: Willodean RosenthalHarraway-Smith, Carolyn, MD;  [facility];  Service: Gynecology;  Laterality: Left;  ? LAPAROSCOPIC SALPINGO OOPHERECTOMY Bilateral 03/17/2021  ? Procedure: LAPAROSCOPIC BIALTERAL SALPINGECTOMY;  Surgeon: Willodean RosenthalHarraway-Smith, Carolyn, MD;  [facility];  Service: Gynecology;  Laterality: Bilateral;  ? WISDOM TOOTH EXTRACTION    ? ? ?There were no vitals filed for this visit. ? ? ? Subjective Assessment - 07/03/21 1332   ? ? Subjective Pt had a fall which resulted in closed displaced fracture of medial maleolus of Lt tibia. She was initially NWB and in a cast, she has [facility] to [facility] with boot this week. Pt states pain "comes and goes". Pain increases with dependent position, eases with rest and elevation   ? Limitations Walking;House hold activities;Standing   ? How long can you stand comfortably? 20-30 minutes   ? How long can you walk comfortably? 10-20 feet   ? Patient Stated Goals return to walking and working   ? Currently  in Pain? Yes   ? Pain Score 3    ? Pain Location Ankle   ? Pain Orientation Left   ? Pain Descriptors / Indicators Aching   ? Pain Type Surgical pain   ? Pain Onset More than a month ago   ? Pain Frequency Intermittent   ? Aggravating Factors  stand, walk, dependent position   ? Pain Relieving Factors elevation, rest   ? ?  ?  ? ?  ? ? ? ? ? [facility] PT Assessment - 07/03/21 0001   ? ?  ? Assessment  ? Medical Diagnosis closed  displaced fracture of medial maleolus of Lt tibia   ? Referring Provider (PT) Daiva Huge   ? Onset Date/Surgical Date 05/12/21   ? Next MD Visit 08/06/21   ? Prior Therapy none   ?  ? Precautions  ? Precaution Comments WBAT, full ROM   ?  ? Restrictions  ? Weight Bearing Restrictions Yes   ? LLE Weight Bearing Weight bearing as tolerated   ?  ? Balance Screen  ? Has the patient fallen in the past 6 months Yes   ? How many times? 1   ? Has the patient had a decrease in activity level because of a fear of falling?  Yes   ? Is the patient reluctant to leave their home because of a fear of falling?  No   ?  ? Prior Function  ? Level of Independence Independent   ? 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  ? Observation/Other Assessments  ? Focus on Therapeutic Outcomes (FOTO)  not indicated   ?  ? ROM / Strength  ? AROM / PROM / Strength AROM;Strength   ?  ? AROM  ? AROM Assessment Site Ankle   ? Right/Left Ankle Right;Left   ? Left Ankle Dorsiflexion 0   ? Left Ankle Plantar Flexion 15   ? Left Ankle Inversion 15   ? Left Ankle Eversion 6   ?  ? Strength  ? Strength Assessment Site Ankle   ? Right/Left Ankle Left;Right   ? Right Ankle Dorsiflexion 4/5   ? Right Ankle Plantar Flexion 4/5   ? Right Ankle Inversion 4/5   ? Right Ankle Eversion 4/5   ? Left Ankle Dorsiflexion 3/5   ? Left Ankle Plantar Flexion 3/5   ? Left Ankle Inversion 3-/5   ? Left Ankle Eversion 3-/5   ?  ? Palpation  ? Palpation comment hypomobile talocrural, midfoot, great toe and metatarsal mobs   ?  ? Ambulation/Gait  ? Assistive device Rolling walker   ? Gait Pattern Step-to pattern   cuing to progress to step through pattern  ? Gait Comments Pt arrives to clinic using knee scooter. She states she has a RW at home. PT educated pt on gait mechanics with RW   ? ?  ?  ? ?  ? ? ? ? ? ? ? ? ? ? ? ? ? ?Objective measurements completed on examination: See above findings.  ? ? ? ? ? [facility] Adult PT Treatment/Exercise - 07/03/21 0001   ? ?  ?  Exercises  ? Exercises Ankle   ? ?  ?  ? ?  ? ? ? ? ? ? ? ? ? ? PT Education - 07/03/21 1408   ? ? Education Details PT POC and goals, HEP   ? Person(s) Educated Patient   ? Methods Explanation;Demonstration;Handout   ? Comprehension  Verbalized understanding;Returned demonstration   ? ?  ?  ? ?  ? ? ? PT Short Term Goals - 07/03/21 1436   ? ?  ? PT SHORT TERM GOAL #1  ? Title Pt will be independent with initial HEP   ? Baseline does not have HEP   ? Time 4   ? Period Weeks   ? Status New   ? Target Date 07/31/21   ?  ? PT SHORT TERM GOAL #2  ? Title Pt will perform gait with good mechanics with LRAD with boot x 5 minutes   ? Baseline Pt able to perform gait 10-20'   ? Time 4   ? Period Weeks   ? Status New   ? Target Date 07/31/21   ? ?  ?  ? ?  ? ? ? ? PT Long Term Goals - 07/03/21 1438   ? ?  ? PT LONG TERM GOAL #1  ? Title Pt will be independent with advanced HEP   ? Baseline does not have HEP   ? Time 8   ? Period Weeks   ? Status New   ? Target Date 08/28/21   ?  ? PT LONG TERM GOAL #2  ? Title Pt will improve Lt ankle strength to 4+/5 to perform job duties with decreased pain   ? Baseline 3-/5   ? Time 8   ? Period Weeks   ? Status New   ? Target Date 08/28/21   ?  ? PT LONG TERM GOAL #3  ? Title Pt will perform gait wearing shoe without AD x 15 minutes to improve community mobility   ? Baseline using knee scooter and walking boot   ? Time 8   ? Period Weeks   ? Status New   ? Target Date 08/28/21   ? ?  ?  ? ?  ? ? ? ? ? ? ? ? ? Plan - 07/03/21 1431   ? ? Clinical Impression Statement Pt is a 26 y/o female referred for closed displaced fracture of medial maleolus Lt tibia. Pt presents with impaired gait, decreased strength and ROM, decreased functional activity tolerance. Pt will benefit from skilled PT to address deficits and improve functional mobility.   ? Personal Factors and Comorbidities Time since onset of injury/illness/exacerbation;Profession   ? Examination-Activity Limitations Locomotion  Level;Stand;Stairs   ? Examination-Participation Restrictions Occupation;Community Activity;Shop;Yard Work   ? Stability/Clinical Decision Making Stable/Uncomplicated   ? Clinical Decision Making Low   ? [facility] [place]

## 2021-07-03 NOTE — Patient Instructions (Signed)
Access Code: [email or web address] ?URL: [email or web address] ?Date: 07/03/2021 ?Prepared by: Reggy Eye ? ?Exercises ?- Seated Toe Towel Scrunches  - 1 x daily - 7 x weekly - 1 sets - 3 reps - 1 minute hold ?- Seated Heel Raise  - 1 x daily - 7 x weekly - 3 sets - 10 reps ?- Seated Toe Raise  - 1 x daily - 7 x weekly - 3 sets - 10 reps ?- Ankle Inversion Eversion Towel Slide  - 1 x daily - 7 x weekly - 3 sets - 10 reps ?- Seated Ankle Inversion Eversion PROM  - 1 x daily - 7 x weekly - 1 sets - 3 reps - 20-30 seconds hold ?- Seated Ankle Plantarflexion Dorsiflexion PROM  - 1 x daily - 7 x weekly - 1 sets - 3 reps - 20-30 seconds hold ?- Long Sitting Calf Stretch with Strap  - 1 x daily - 7 x weekly - 1 sets - 3 reps - 20-30 seconds hold ?

## 2021-07-09 ENCOUNTER — Emergency Department (HOSPITAL_BASED_OUTPATIENT_CLINIC_OR_DEPARTMENT_OTHER)
Admission: EM | Admit: 2021-07-09 | Discharge: 2021-07-10 | Disposition: A | Discharge status: Home / Self Care | Payer: Medicaid Other | Attending: Emergency Medicine | Admitting: Emergency Medicine

## 2021-07-09 ENCOUNTER — Encounter (HOSPITAL_BASED_OUTPATIENT_CLINIC_OR_DEPARTMENT_OTHER): Payer: Self-pay

## 2021-07-09 ENCOUNTER — Other Ambulatory Visit: Payer: Self-pay

## 2021-07-09 DIAGNOSIS — R112 Nausea with vomiting, unspecified: Secondary | ICD-10-CM | POA: Diagnosis present

## 2021-07-09 DIAGNOSIS — F1290 Cannabis use, unspecified, uncomplicated: Secondary | ICD-10-CM | POA: Diagnosis not present

## 2021-07-09 DIAGNOSIS — Z5321 Procedure and treatment not carried out due to patient leaving prior to being seen by health care provider: Secondary | ICD-10-CM | POA: Diagnosis not present

## 2021-07-09 HISTORY — DX: Cannabis use, unspecified, uncomplicated: F12.90

## 2021-07-09 HISTORY — DX: Cannabis hyperemesis syndrome: R11.16

## 2021-07-09 HISTORY — DX: Nausea with vomiting, unspecified: R11.2

## 2021-07-09 MED ORDER — HALOPERIDOL LACTATE 5 MG/ML IJ SOLN
5.0000 mg | Freq: Once | INTRAMUSCULAR | Status: AC
  Administered 2021-07-10: 5 mg via INTRAMUSCULAR
  Filled 2021-07-09: qty 1

## 2021-07-09 NOTE — ED Triage Notes (Signed)
Patient arrives POV from home c/o vomiting and diarrhea that started around 0400 this morning. Pt reports vomiting 30 times today; pt reports "liquidy" diarrhea and abdominal pain. Pt a&o x4. ?

## 2021-07-10 ENCOUNTER — Encounter: Payer: Medicaid Other | Admitting: Physical Therapy

## 2021-07-10 ENCOUNTER — Other Ambulatory Visit: Payer: Self-pay

## 2021-07-10 ENCOUNTER — Encounter (HOSPITAL_BASED_OUTPATIENT_CLINIC_OR_DEPARTMENT_OTHER): Payer: Self-pay | Admitting: Emergency Medicine

## 2021-07-10 ENCOUNTER — Emergency Department (HOSPITAL_BASED_OUTPATIENT_CLINIC_OR_DEPARTMENT_OTHER)
Admission: EM | Admit: 2021-07-10 | Discharge: 2021-07-10 | Disposition: A | Discharge status: Home / Self Care | Payer: Medicaid Other | Source: Home / Self Care | Attending: Emergency Medicine | Admitting: Emergency Medicine

## 2021-07-10 DIAGNOSIS — R1110 Vomiting, unspecified: Secondary | ICD-10-CM | POA: Insufficient documentation

## 2021-07-10 DIAGNOSIS — Z5321 Procedure and treatment not carried out due to patient leaving prior to being seen by health care provider: Secondary | ICD-10-CM | POA: Insufficient documentation

## 2021-07-10 DIAGNOSIS — R1115 Cyclical vomiting syndrome unrelated to migraine: Secondary | ICD-10-CM

## 2021-07-10 MED ORDER — ONDANSETRON 4 MG PO TBDP: 4.0000 mg | ORAL_TABLET | Freq: Three times a day (TID) | ORAL | 0 refills | Status: DC | PRN

## 2021-07-10 MED ORDER — ONDANSETRON 4 MG PO TBDP
4.0000 mg | ORAL_TABLET | Freq: Once | ORAL | Status: AC
Start: 2021-07-10 — End: 2021-07-10
  Administered 2021-07-10: 4 mg via ORAL
  Filled 2021-07-10: qty 1

## 2021-07-10 MED ORDER — HALOPERIDOL LACTATE 5 MG/ML IJ SOLN
5.0000 mg | Freq: Once | INTRAMUSCULAR | Status: AC
  Administered 2021-07-10: 5 mg via INTRAMUSCULAR
  Filled 2021-07-10: qty 1

## 2021-07-10 NOTE — ED Notes (Signed)
Pt was seen at [facility] left approx 8pm ?Labs, CT and IVF & meds given.  Returns here by EMS for the same. ?

## 2021-07-10 NOTE — ED Provider Notes (Addendum)
? ?[facility] EMERGENCY DEPARTMENT  ?Provider Note ? ?CSN: [id number] ?Arrival date & time: 07/09/21 2159 ? ?History ?Chief Complaint  ?Patient presents with  ? Emesis  ?  Abd pain/Vomiting/diarrhea  ? Abdominal Pain  ? ? ?Christine Trujillo is a 26 y.o. female with history of daily marijuana use has had episodes of abdominal pain and vomiting every few months for a while. She has been vomiting since about 4am, seen at [facility] where labs and CT were neg. She continued to vomit after leaving there and came here for evaluation. She was given a dose of IM Haldol prior to my evaluation with complete resolution of symptoms.  ? ? ?Home Medications ?Prior to Admission medications   ?Medication Sig Start Date End Date Taking? Authorizing Provider  ?ondansetron (ZOFRAN-ODT) 4 MG disintegrating tablet Take 1 tablet (4 mg total) by mouth every 8 (eight) hours as needed for nausea or vomiting. 07/10/21  Yes Pollyann Savoy, MD  ?Biotin w/ Vitamins C & E (HAIR/SKIN/NAILS PO) Take 1 tablet by mouth daily.    [provider]  ?Multiple Vitamins-Minerals (MULTIVITAMIN WITH MINERALS) tablet Take 1 tablet by mouth daily.    [provider]  ?pantoprazole (PROTONIX) 40 MG tablet Take 1 tablet (40 mg total) by mouth daily. ?Patient taking differently: Take 40 mg by mouth daily. 03/04/21   Sharlene Dory, DO  ? ? ? ?Allergies    ?Patient has no known allergies. ? ? ?Review of Systems   ?Review of Systems ?Please see HPI for pertinent positives and negatives ? ?Physical Exam ?BP (!) 165/104 (BP Location: Left Wrist)   Pulse (!) 58   Temp 98.4 ?F (36.9 ?C) (Oral)   Resp 20   Ht 5\' 3"  (1.6 m)   Wt 117.9 kg   LMP 07/04/2021 (Exact Date) Comment: pt reports passing "big blood clots"  SpO2 100%   BMI 46.06 kg/m?  ? ?Physical Exam ?Vitals and nursing note reviewed.  ?Constitutional:   ?   Appearance: Normal appearance.  ?HENT:  ?   Head: Normocephalic and atraumatic.  ?   Nose: Nose normal.  ?    Mouth/Throat:  ?   Mouth: Mucous membranes are moist.  ?Eyes:  ?   Extraocular Movements: Extraocular movements intact.  ?   Conjunctiva/sclera: Conjunctivae normal.  ?Cardiovascular:  ?   Rate and Rhythm: Normal rate.  ?Pulmonary:  ?   Effort: Pulmonary effort is normal.  ?   Breath sounds: Normal breath sounds.  ?Abdominal:  ?   General: Abdomen is flat.  ?   Palpations: Abdomen is soft.  ?   Tenderness: There is no abdominal tenderness.  ?Musculoskeletal:     ?   General: No swelling. Normal range of motion.  ?   Cervical back: Neck supple.  ?Skin: ?   General: Skin is warm and dry.  ?Neurological:  ?   General: No focal deficit present.  ?   Mental Status: She is alert.  ?Psychiatric:     ?   Mood and Affect: Mood normal.  ? ? ?ED Results / Procedures / Treatments   ?EKG ?None ? ?Procedures ?Procedures ? ?Medications Ordered in the ED ?Medications  ?haloperidol lactate (HALDOL) injection 5 mg (5 mg Intramuscular Given 07/10/21 0000)  ? ? ?Initial Impression and Plan ? Patient well appearing now. No distress and no more vomiting. Labs and [facility] results from [facility] ED reviewed. She is symptom free now and would like to go home. She was advised  to avoid any further THC use as this is likely the precipitating cause of her symptoms.  ? ?ED Course  ? ?  ? ? ?MDM Rules/Calculators/A&P ?Medical Decision Making ?Problems Addressed: ?Cannabinoid hyperemesis syndrome: acute illness or injury ? ?Amount and/or Complexity of Data Reviewed ?External Data Reviewed: labs, radiology and notes. ? ?Risk ?Prescription drug management. ? ? ? ?Final Clinical Impression(s) / ED Diagnoses ?Final diagnoses:  ?Cannabinoid hyperemesis syndrome  ? ? ?Rx / DC Orders ?ED Discharge Orders   ? ?      Ordered  ?  ondansetron (ZOFRAN-ODT) 4 MG disintegrating tablet  Every 8 hours PRN       ? 07/10/21 0055  ? ?  ?  ? ?  ? ?  ?  ?Pollyann Savoy, MD ?07/10/21 [phone] ? ?

## 2021-07-10 NOTE — ED Triage Notes (Signed)
Vomiting , was eval last night for same  ?

## 2021-07-10 NOTE — ED Notes (Signed)
Drove to ED, called Mother, and she will pick up around 1130 ?

## 2021-07-10 NOTE — ED Notes (Signed)
States," I am ready to leave, can you get me my papers?" ?

## 2021-07-11 ENCOUNTER — Emergency Department (HOSPITAL_BASED_OUTPATIENT_CLINIC_OR_DEPARTMENT_OTHER)
Admission: EM | Admit: 2021-07-11 | Discharge: 2021-07-11 | Disposition: A | Discharge status: Home / Self Care | Payer: Medicaid Other | Attending: Emergency Medicine | Admitting: Emergency Medicine

## 2021-07-11 ENCOUNTER — Other Ambulatory Visit: Payer: Self-pay

## 2021-07-11 ENCOUNTER — Encounter (HOSPITAL_BASED_OUTPATIENT_CLINIC_OR_DEPARTMENT_OTHER): Payer: Self-pay | Admitting: Emergency Medicine

## 2021-07-11 DIAGNOSIS — R1110 Vomiting, unspecified: Secondary | ICD-10-CM | POA: Diagnosis present

## 2021-07-11 DIAGNOSIS — R1084 Generalized abdominal pain: Secondary | ICD-10-CM | POA: Insufficient documentation

## 2021-07-11 DIAGNOSIS — R197 Diarrhea, unspecified: Secondary | ICD-10-CM | POA: Diagnosis not present

## 2021-07-11 DIAGNOSIS — R1115 Cyclical vomiting syndrome unrelated to migraine: Secondary | ICD-10-CM | POA: Diagnosis not present

## 2021-07-11 LAB — COMPREHENSIVE METABOLIC PANEL
ALT: 18 U/L (ref 0–44)
AST: 21 U/L (ref 15–41)
Albumin: 4.2 g/dL (ref 3.5–5.0)
Alkaline Phosphatase: 74 U/L (ref 38–126)
Anion gap: 12 (ref 5–15)
BUN: 12 mg/dL (ref 6–20)
CO2: 21 mmol/L — ABNORMAL LOW (ref 22–32)
Calcium: 8.9 mg/dL (ref 8.9–10.3)
Chloride: 103 mmol/L (ref 98–111)
Creatinine, Ser: 0.92 mg/dL (ref 0.44–1.00)
GFR, Estimated: >60 mL/min (ref >60)
Glucose, Bld: 129 mg/dL — ABNORMAL HIGH (ref 70–99)
Potassium: 3 mmol/L — ABNORMAL LOW (ref 3.5–5.1)
Sodium: 136 mmol/L (ref 135–145)
Total Bilirubin: 0.8 mg/dL (ref 0.3–1.2)
Total Protein: 7.7 g/dL (ref 6.5–8.1)

## 2021-07-11 LAB — CBC WITH DIFFERENTIAL/PLATELET
Abs Immature Granulocytes: 0.07 10*3/uL (ref 0.00–0.07)
Basophils Absolute: 0 10*3/uL (ref 0.0–0.1)
Basophils Relative: 0 %
Eosinophils Absolute: 0 10*3/uL (ref 0.0–0.5)
Eosinophils Relative: 0 %
HCT: 39.6 % (ref 36.0–46.0)
Hemoglobin: 13.4 g/dL (ref 12.0–15.0)
Immature Granulocytes: 1 %
Lymphocytes Relative: 12 %
Lymphs Abs: 1.7 10*3/uL (ref 0.7–4.0)
MCH: 27.5 pg (ref 26.0–34.0)
MCHC: 33.8 g/dL (ref 30.0–36.0)
MCV: 81.3 fL (ref 80.0–100.0)
Monocytes Absolute: 1.1 10*3/uL — ABNORMAL HIGH (ref 0.1–1.0)
Monocytes Relative: 8 %
Neutro Abs: 10.6 10*3/uL — ABNORMAL HIGH (ref 1.7–7.7)
Neutrophils Relative %: 79 %
Platelets: 358 10*3/uL (ref 150–400)
RBC: 4.87 MIL/uL (ref 3.87–5.11)
RDW: 13.8 % (ref 11.5–15.5)
WBC: 13.4 10*3/uL — ABNORMAL HIGH (ref 4.0–10.5)
nRBC: 0 % (ref 0.0–0.2)

## 2021-07-11 LAB — URINALYSIS, MICROSCOPIC (REFLEX)

## 2021-07-11 LAB — URINALYSIS, ROUTINE W REFLEX MICROSCOPIC
Bilirubin Urine: NEGATIVE
Glucose, UA: NEGATIVE mg/dL
Ketones, ur: NEGATIVE mg/dL
Leukocytes,Ua: NEGATIVE
Nitrite: NEGATIVE
Protein, ur: NEGATIVE mg/dL
Specific Gravity, Urine: 1.025 (ref 1.005–1.030)
pH: 7 (ref 5.0–8.0)

## 2021-07-11 LAB — MAGNESIUM: Magnesium: 2.1 mg/dL (ref 1.7–2.4)

## 2021-07-11 MED ORDER — PROMETHAZINE HCL 25 MG RE SUPP: 25.0000 mg | Freq: Four times a day (QID) | RECTAL | 0 refills | Status: DC | PRN

## 2021-07-11 MED ORDER — LORAZEPAM 2 MG/ML IJ SOLN
2.0000 mg | Freq: Once | INTRAMUSCULAR | Status: AC
  Administered 2021-07-11: 2 mg via INTRAVENOUS
  Filled 2021-07-11: qty 1

## 2021-07-11 MED ORDER — PROCHLORPERAZINE EDISYLATE 10 MG/2ML IJ SOLN
10.0000 mg | Freq: Once | INTRAMUSCULAR | Status: AC
  Administered 2021-07-11: 10 mg via INTRAVENOUS
  Filled 2021-07-11: qty 2

## 2021-07-11 MED ORDER — LACTATED RINGERS IV BOLUS
1000.0000 mL | Freq: Once | INTRAVENOUS | Status: AC
  Administered 2021-07-11: 1000 mL via INTRAVENOUS

## 2021-07-11 MED ORDER — HALOPERIDOL LACTATE 5 MG/ML IJ SOLN
5.0000 mg | Freq: Once | INTRAMUSCULAR | Status: AC
Start: 2021-07-11 — End: 2021-07-11
  Administered 2021-07-11: 5 mg via INTRAVENOUS
  Filled 2021-07-11: qty 1

## 2021-07-11 MED ORDER — POTASSIUM CHLORIDE CRYS ER 20 MEQ PO TBCR
40.0000 meq | EXTENDED_RELEASE_TABLET | ORAL | Status: AC
  Administered 2021-07-11 (×2): 40 meq via ORAL
  Filled 2021-07-11: qty 2

## 2021-07-11 NOTE — ED Notes (Signed)
Pt remains nauseous, unable to tolerate po potassium at this time.  Provider made aware ?

## 2021-07-11 NOTE — ED Provider Notes (Signed)
?[facility] EMERGENCY DEPARTMENT ?Provider Note ? ? ?CSN: [id number] ?Arrival date & time: 07/11/21  1102 ? ?  ? ?History ? ?Chief Complaint  ?Patient presents with  ?? Emesis  ? ? ?Christine Trujillo is a 26 y.o. female. ? ?HPI ? ?  ? ?26 year old female comes in with chief complaint of vomiting, abdominal pain. ? ?Patient has had 3-day history of abdominal pain with vomiting and diarrhea.  She indicates that the pain is generalized and constant.  She has had about 10+ episodes of emesis in the last 24 hours.  She suspects that now it is yellow, green and there is no blood.  She also has had about 5 episodes of loose bowel movements, now they are bloody.  She is not noticing any blood clots in the stools. ? ?Patient denies any UTI-like symptoms, vaginal discharge or bleeding.  She denies any high risk behavior for STI and doubts that she has STI. ? ?She was seen in the ER recently.  There was concern that she might have hyperemesis from cannabinoid use.  Patient does admit to using marijuana heavily.  She however has not had any symptoms like this in the past.  She denies any abdominal surgical history. ? ?Home Medications ?Prior to Admission medications   ?Medication Sig Start Date End Date Taking? Authorizing Provider  ?promethazine (PHENERGAN) 25 MG suppository Place 1 suppository (25 mg total) rectally every 6 (six) hours as needed for nausea. 07/11/21  Yes Derwood Kaplan, MD  ?Biotin w/ Vitamins C & E (HAIR/SKIN/NAILS PO) Take 1 tablet by mouth daily.    [provider]  ?Multiple Vitamins-Minerals (MULTIVITAMIN WITH MINERALS) tablet Take 1 tablet by mouth daily.    [provider]  ?ondansetron (ZOFRAN-ODT) 4 MG disintegrating tablet Take 1 tablet (4 mg total) by mouth every 8 (eight) hours as needed for nausea or vomiting. 07/10/21   Pollyann Savoy, MD  ?pantoprazole (PROTONIX) 40 MG tablet Take 1 tablet (40 mg total) by mouth daily. ?Patient taking differently: Take 40 mg by  mouth daily. 03/04/21   Sharlene Dory, DO  ?   ? ?Allergies    ?Patient has no known allergies.   ? ?Review of Systems   ?Review of Systems  ?All other systems reviewed and are negative. ? ?Physical Exam ?Updated Vital Signs ?BP (!) 152/98   Pulse (!) 58   Temp 98.4 ?F (36.9 ?C) (Oral)   Resp 18   LMP 07/04/2021 (Exact Date) Comment: pt reports passing "big blood clots"  SpO2 98%  ?Physical Exam ?Vitals and nursing note reviewed.  ?Constitutional:   ?   Appearance: She is well-developed.  ?HENT:  ?   Head: Atraumatic.  ?Cardiovascular:  ?   Rate and Rhythm: Normal rate.  ?Pulmonary:  ?   Effort: Pulmonary effort is normal.  ?Abdominal:  ?   Palpations: Abdomen is soft.  ?   Comments: Patient with obesity, abdomen is soft, generalized tenderness no rebound or guarding  ?Musculoskeletal:  ?   Cervical back: Normal range of motion and neck supple.  ?Skin: ?   General: Skin is warm and dry.  ?Neurological:  ?   Mental Status: She is alert and oriented to person, place, and time.  ? ? ?ED Results / Procedures / Treatments   ?Labs ?(all labs ordered are listed, but only abnormal results are displayed) ?Labs Reviewed  ?CBC WITH DIFFERENTIAL/PLATELET - Abnormal; Notable for the following components:  ?    Result Value  ?  WBC 13.4 (*)   ? Neutro Abs 10.6 (*)   ? Monocytes Absolute 1.1 (*)   ? All other components within normal limits  ?COMPREHENSIVE METABOLIC PANEL - Abnormal; Notable for the following components:  ? Potassium 3.0 (*)   ? CO2 21 (*)   ? Glucose, Bld 129 (*)   ? All other components within normal limits  ?URINALYSIS, ROUTINE W REFLEX MICROSCOPIC - Abnormal; Notable for the following components:  ? APPearance CLOUDY (*)   ? Hgb urine dipstick LARGE (*)   ? All other components within normal limits  ?URINALYSIS, MICROSCOPIC (REFLEX) - Abnormal; Notable for the following components:  ? Bacteria, UA MANY (*)   ? All other components within normal limits  ?MAGNESIUM  ? ? ?EKG ?EKG  Interpretation ? ?Date/Time:  Saturday July 11 2021 12:43:28 EDT ?Ventricular Rate:  60 ?PR Interval:  112 ?QRS Duration: 99 ?QT Interval:  455 ?QTC Calculation: 455 ?R Axis:   60 ?Text Interpretation: Sinus rhythm Borderline short PR interval No acute changes No significant change since last tracing Confirmed by Derwood KaplanNanavati, Damein Gaunce [phone]([id number]) on 07/11/2021 1:22:09 PM ? ?Radiology ?No results found. ? ?Procedures ?Procedures  ? ? ?Medications Ordered in ED ?Medications  ?lactated ringers bolus 1,000 mL (0 mLs Intravenous Stopped 07/11/21 1418)  ?haloperidol lactate (HALDOL) injection 5 mg (5 mg Intravenous Given 07/11/21 1244)  ?potassium chloride SA (KLOR-CON M) CR tablet 40 mEq (40 mEq Oral Given 07/11/21 1519)  ?LORazepam (ATIVAN) injection 2 mg (2 mg Intravenous Given 07/11/21 1418)  ?prochlorperazine (COMPAZINE) injection 10 mg (10 mg Intravenous Given 07/11/21 1417)  ? ? ?ED Course/ Medical Decision Making/ A&P ?Clinical Course as of 07/11/21 1535  ?Sat Jul 11, 2021  ?1401 Potassium(!): 3.0 ?Likely secondary to vomiting [AN]  ?1401 EKG ordered, no QT prolongation, no evidence of ischemia [AN]  ?1401 Magnesium: 2.1 ?Magnesium ordered, it is within normal limits.  UA pending at this time. [AN]  ?1401 WBC(!): 13.4 ?Slightly elevated white count.  No significant clinical bearing of it. [AN]  ?1402 CT impression from 3-30: ?IMPRESSION:  ?1. No acute abdominal/pelvic findings, mass lesions or adenopathy.  ?2. Status post cholecystectomy. No biliary dilatation.  ? [AN]  ?1534 Pt reassessed. Pt's VSS and WNL. Pt's cap refill < 3 seconds. Pt has been hydrated in the ER and now passed po challenge. ?We will discharge with antiemetic. Strict ER return precautions have been discussed and pt will return if he is unable to tolerate fluids and symptoms are getting worse. [AN]  ?1534 Patient has been resting comfortably over the last 45 minutes.  No more emesis.  Tolerated p.o. potassium. [AN]  ?1534 Bacteria, UA(!): MANY ?Patient has  bacteriuria, but she denies any burning with urination, urinary frequency.  Clinically not UTI.  Will not treat, but did advise patient to return to the ER if she starts having fevers, burning with urination, lower abdominal pain. [AN]  ?  ?Clinical Course User Index ?[AN] Derwood KaplanNanavati, Kenan Moodie, MD  ? ?                        ?Medical Decision Making ?Amount and/or Complexity of Data Reviewed ?Labs: ordered. Decision-making details documented in ED Course. ? ?Risk ?Prescription drug management. ? ? ?This patient presents to the ED with chief complaint(s) of generalized abdominal pain with nausea, vomiting, diarrhea with pertinent past medical history of pelvic cyst , GERD and heavy cannabinoid use which further complicates the presenting complaint. The complaint involves  an extensive differential diagnosis and treatment options and also carries with it a high risk of complications and morbidity.   ? ?The differential diagnosis includes : ?Abdominal pain with associated N-V-D secondary to gastroenteritis, small bowel obstruction, ileus, colitis, internal hemorrhoids, diverticular bleed. ?Cyclic vomiting syndrome also possibility secondary to gastroparesis or cannabinoid use disorder. ? ?The initial plan is to focus on symptom management, get basic labs. ? ? ?Additional history obtained: ?Records reviewed Care Everywhere/External Records  -patient was seen at [facility] with similar complaints on 3-30.  She had a CT abdomen and pelvis completed at that time which did not reveal any evidence of obstruction, colitis, internal bleeding. ? ?Given the negative CT scan 2 days ago, our focus will be symptom management at this time. ? ? ?Final Clinical Impression(s) / ED Diagnoses ?Final diagnoses:  ?Cyclic vomiting syndrome  ? ? ?Rx / DC Orders ?ED Discharge Orders   ? ?      Ordered  ?  promethazine (PHENERGAN) 25 MG suppository  Every 6 hours PRN       ? 07/11/21 1532  ? ?  ?  ? ?  ? ? ?  ?Derwood Kaplan, MD ?07/11/21  1535 ? ?

## 2021-07-11 NOTE — Discharge Instructions (Addendum)
You are seen in the ER for nausea, vomiting and diarrhea.  Although you are having bloody stools, your hemoglobin is normal.  Possibly self-limiting diarrhea at this time. ? ?Our suspicion is that you having cyclic vomiting syndrome.  The cause could be cannabinoid use. ? ?We recommend that you have simple diet for now, low processed food, no meats or fast food. ?Take promethazine suppository for your nausea if Zofran is not helping. ?

## 2021-07-11 NOTE — ED Triage Notes (Signed)
Pt reports continuing emesis since being seen yesterday in the ED for same. Also reports she is now having diarrhea (7-10x) with some blood. Pt alert, oriented, and in no acute distress in triage. ?

## 2021-07-11 NOTE — ED Notes (Signed)
Pt reports slight improvement in symptoms, took po potassium ?

## 2021-07-17 ENCOUNTER — Ambulatory Visit: Payer: Medicaid Other | Attending: Orthopedic Surgery | Admitting: Physical Therapy

## 2021-07-17 DIAGNOSIS — M25572 Pain in left ankle and joints of left foot: Secondary | ICD-10-CM | POA: Insufficient documentation

## 2021-07-17 DIAGNOSIS — R262 Difficulty in walking, not elsewhere classified: Secondary | ICD-10-CM | POA: Diagnosis present

## 2021-07-17 DIAGNOSIS — M6281 Muscle weakness (generalized): Secondary | ICD-10-CM | POA: Insufficient documentation

## 2021-07-17 NOTE — Patient Instructions (Signed)
Access Code: [email or web address] ?URL: [email or web address] ?Date: 07/17/2021 ?Prepared by: Reggy Eye ? ?Exercises ?- Seated Toe Towel Scrunches  - 1 x daily - 7 x weekly - 1 sets - 3 reps - 1 minute hold ?- Seated Heel Raise  - 1 x daily - 7 x weekly - 3 sets - 10 reps ?- Seated Toe Raise  - 1 x daily - 7 x weekly - 3 sets - 10 reps ?- Ankle Inversion Eversion Towel Slide  - 1 x daily - 7 x weekly - 3 sets - 10 reps ?- Seated Ankle Inversion Eversion PROM  - 1 x daily - 7 x weekly - 1 sets - 3 reps - 20-30 seconds hold ?- Seated Ankle Plantarflexion Dorsiflexion PROM  - 1 x daily - 7 x weekly - 1 sets - 3 reps - 20-30 seconds hold ?- Long Sitting Calf Stretch with Strap  - 1 x daily - 7 x weekly - 1 sets - 3 reps - 20-30 seconds hold ?- Ankle Inversion with Resistance  - 1 x daily - 7 x weekly - 3 sets - 10 reps ?- Ankle Eversion with Resistance  - 1 x daily - 7 x weekly - 3 sets - 10 reps ?- Ankle Dorsiflexion with Resistance  - 1 x daily - 7 x weekly - 3 sets - 10 reps ?- Ankle and Toe Plantarflexion with Resistance  - 1 x daily - 7 x weekly - 3 sets - 10 reps ?

## 2021-07-17 NOTE — Therapy (Signed)
[place] ?[facility] ?[address] [place] [address] ?[place], [place], [address] ?Phone: [phone]   Fax:  [phone] ? ?Physical Therapy Treatment ? ?Patient Details  ?Name: Christine Trujillo ?MRN: [id number] ?Date of Birth: Mar 06, 1996 ?Referring Provider (PT): Daiva Huge ? ? ?Encounter Date: 07/17/2021 ? ? PT End of Session - 07/17/21 1432   ? ? Visit Number 2   ? Number of Visits 4   ? Date for PT Re-Evaluation 07/31/21   ? Authorization Type Medicaid [place]   ? Authorization - Visit Number 2   ? Authorization - Number of Visits 4   ? Progress Note Due on Visit 4   ? PT Start Time 1355   ? PT Stop Time 1433   ? PT Time Calculation (min) 38 min   ? Activity Tolerance Patient tolerated treatment well   ? Behavior During Therapy [facility] for tasks assessed/performed   ? ?  ?  ? ?  ? ? ?Past Medical History:  ?Diagnosis Date  ? Anxiety   ? Cannabinoid hyperemesis syndrome   ? Depression   ? GERD (gastroesophageal reflux disease)   ? History of anemia   ? during pregnancy  ? History of gestational diabetes   ? Ovarian cyst   ? left  ? Pelvic pain   ? Upper respiratory symptom   ? symptoms started 02-16-2021 cough/ runny nose;  pcp visit 03-10-2021 with sore throat/ congestion/ sob/ body aches/ cough but no fever, negative covid/ flu/ strep resultsin epic  (03-13-2021  pt stated today only has nonproductive cough)  ? ? ?Past Surgical History:  ?Procedure Laterality Date  ? BIOPSY  10/22/2020  ? Procedure: BIOPSY;  Surgeon: Shellia Cleverly, DO;  Location: [facility];  Service: Gastroenterology;;  ? CHOLECYSTECTOMY N/A 12/02/2016  ? Procedure: LAPAROSCOPIC CHOLECYSTECTOMY WITH INTRAOPERATIVE CHOLANGIOGRAM;  Surgeon: Ovidio Kin, MD;  Location: [facility];  Service: General;  Laterality: N/A;  ? COLONOSCOPY WITH PROPOFOL N/A 10/22/2020  ? Procedure: COLONOSCOPY WITH PROPOFOL;  Surgeon: Shellia Cleverly, DO;  Location: [facility];  Service: Gastroenterology;  Laterality: N/A;  ?  ESOPHAGOGASTRODUODENOSCOPY (EGD) WITH PROPOFOL N/A 10/22/2020  ? Procedure: ESOPHAGOGASTRODUODENOSCOPY (EGD) WITH PROPOFOL;  Surgeon: Shellia Cleverly, DO;  Location: [facility];  Service: Gastroenterology;  Laterality: N/A;  ? ESOPHAGOGASTRODUODENOSCOPY (EGD) WITH PROPOFOL N/A 10/22/2020  ? Procedure: ESOPHAGOGASTRODUODENOSCOPY (EGD) WITH PROPOFOL;  Surgeon: Shellia Cleverly, DO;  Location: [facility];  Service: Gastroenterology;  Laterality: N/A;  ? LAPAROSCOPIC OVARIAN CYSTECTOMY Left 03/17/2021  ? Procedure: LAPAROSCOPIC Resection of Adnexal Mass;  Surgeon: Willodean Rosenthal, MD;  Location: [facility];  Service: Gynecology;  Laterality: Left;  ? LAPAROSCOPIC SALPINGO OOPHERECTOMY Bilateral 03/17/2021  ? Procedure: LAPAROSCOPIC BIALTERAL SALPINGECTOMY;  Surgeon: Willodean Rosenthal, MD;  Location[facility];  Service: Gynecology;  Laterality: Bilateral;  ? TUBAL LIGATION    ? pt unsure of affected side (procedure on 03/17/21)  ? WISDOM TOOTH EXTRACTION    ? ? ?There were no vitals filed for this visit. ? ? Subjective Assessment - 07/17/21 1354   ? ? Subjective Pt states her ankle is feeling "good". She has been walking with boot and without AD   ? Patient Stated Goals return to walking and working   ? Currently in Pain? Yes   ? Pain Score 3    ? Pain Location Ankle   ? Pain Orientation Left   ? Pain Descriptors / Indicators Aching   ? ?  ?  ? ?  ? ? ? ? ?  [facility] PT Assessment - 07/17/21 0001   ? ?  ? Assessment  ? Medical Diagnosis closed displaced fracture of medial maleolus of Lt tibia   ? Referring Provider (PT) Daiva Huge   ? Onset Date/Surgical Date 05/12/21   ? Next MD Visit 08/06/21   ?  ? Strength  ? Left Ankle Inversion 3-/5   ? Left Ankle Eversion 3/5   ? ?  ?  ? ?  ? ? ? ? ? ? ? ? ? ? ? ? ? ? ? ? [facility] Adult PT Treatment/Exercise - 07/17/21 0001   ? ?  ? Ambulation/Gait  ? Assistive device None   ? Gait Pattern Step-through pattern   ?  ? Manual  Therapy  ? Manual Therapy Passive ROM   ? Passive ROM Lt ankle all directions   ?  ? Ankle Exercises: Aerobic  ? Nustep L5 x 4 min for warm up   ?  ? Ankle Exercises: Supine  ? T-Band ankle 4 way x 12 yellow TB (no resistance for inversion)   ?  ? Ankle Exercises: Seated  ? Towel Crunch 2 reps   2 x 1 min  ? Heel Raises 20 reps   ? Toe Raise 20 reps   ?  ? Ankle Exercises: Stretches  ? Soleus Stretch 2 reps;20 seconds   ? Gastroc Stretch 2 reps;20 seconds   ? Other Stretch great toe stretch flexion/extension 2 x 20 sec each   ?  ? Ankle Exercises: Standing  ? Other Standing Ankle Exercises standing wt shifts laterally and in pre gait stance all x 1 min   ? ?  ?  ? ?  ? ? ? ? ? ? ? ? ? ? PT Education - 07/17/21 1426   ? ? Education Details updated HEP   ? Person(s) Educated Patient   ? Methods Explanation;Demonstration;Handout   ? Comprehension Returned demonstration;Verbalized understanding   ? ?  ?  ? ?  ? ? ? PT Short Term Goals - 07/03/21 1436   ? ?  ? PT SHORT TERM GOAL #1  ? Title Pt will be independent with initial HEP   ? Baseline does not have HEP   ? Time 4   ? Period Weeks   ? Status New   ? Target Date 07/31/21   ?  ? PT SHORT TERM GOAL #2  ? Title Pt will perform gait with good mechanics with LRAD with boot x 5 minutes   ? Baseline Pt able to perform gait 10-20'   ? Time 4   ? Period Weeks   ? Status New   ? Target Date 07/31/21   ? ?  ?  ? ?  ? ? ? ? PT Long Term Goals - 07/03/21 1438   ? ?  ? PT LONG TERM GOAL #1  ? Title Pt will be independent with advanced HEP   ? Baseline does not have HEP   ? Time 8   ? Period Weeks   ? Status New   ? Target Date 08/28/21   ?  ? PT LONG TERM GOAL #2  ? Title Pt will improve Lt ankle strength to 4+/5 to perform job duties with decreased pain   ? Baseline 3-/5   ? Time 8   ? Period Weeks   ? Status New   ? Target Date 08/28/21   ?  ? PT LONG TERM GOAL #3  ? Title Pt will perform gait  wearing shoe without AD x 15 minutes to improve community mobility   ? Baseline  using knee scooter and walking boot   ? Time 8   ? Period Weeks   ? Status New   ? Target Date 08/28/21   ? ?  ?  ? ?  ? ? ? ? ? ? ? ? Plan - 07/17/21 1433   ? ? Clinical Impression Statement Pt reports she has been able to walk without device in boot with no increased pain. She is improving Lt ankle strength and mobility, having difficulty with great toe mobility so stretches added. HEP updated to include ankle 4 way with resistance. Pt encouraged to bring shoe next visit as MD recommends weaning from boot as able   ? PT Next Visit Plan gait and standing balance in shoe, ankle strength and ROM   ? PT Home Exercise Plan [email or web address]   ? Consulted and Agree with Plan of Care Patient   ? ?  ?  ? ?  ? ? ?Patient will benefit from skilled therapeutic intervention in order to improve the following deficits and impairments:    ? ?Visit Diagnosis: ?Muscle weakness (generalized) ? ?Difficulty in walking, not elsewhere classified ? ?Pain in left ankle and joints of left foot ? ? ? ? ?Problem List ?Patient Active Problem List  ? Diagnosis Date Noted  ? Pelvic pain in female 03/17/2021  ? Request for sterilization 03/17/2021  ? Gastroesophageal reflux disease 03/04/2021  ? Pain of upper abdomen   ? Non-intractable vomiting   ? Family history- stomach cancer   ? Diarrhea   ? Change in bowel habits   ? Depression, major, single episode, mild (HCC) 01/23/2020  ? Capsulitis of right shoulder 10/05/2019  ? GAD (generalized anxiety disorder) 04/20/2019  ? Dermatitis 05/10/2018  ? Closed fracture of tooth 05/10/2018  ? Chronic midline low back pain without sciatica 07/21/2016  ? BMI 45.0-49.9, adult (HCC) 04/22/2016  ? Depression, recurrent (HCC) 04/22/2016  ? Adnexal cyst 03/25/2016  ? ? ?Fahed Morten, PT ?07/17/2021, 2:36 PM ? ?[place] ?[facility] ?[address] ?[place], [place], [address] ?Phone: [phone]   Fax:  [phone] ? ?Name: Christine Trujillo ?MRN: [id number] ?Date of  Birth: 06/27/1995 ? ? ? ?

## 2021-07-24 ENCOUNTER — Ambulatory Visit: Payer: Medicaid Other | Admitting: Physical Therapy

## 2021-07-24 DIAGNOSIS — M6281 Muscle weakness (generalized): Secondary | ICD-10-CM

## 2021-07-24 DIAGNOSIS — M25572 Pain in left ankle and joints of left foot: Secondary | ICD-10-CM

## 2021-07-24 DIAGNOSIS — R262 Difficulty in walking, not elsewhere classified: Secondary | ICD-10-CM

## 2021-07-24 NOTE — Therapy (Signed)
[place] [facility] ?[address] [place] [address] ?[place], [place], [address] ?Phone: [phone]   Fax:  [phone] ? ?Physical Therapy Treatment ? ?Patient Details  ?Name: Christine Trujillo ?MRN: [id number] ?Date of Birth: [birth year] ?Referring Provider (PT): Daiva Huge ? ? ?Encounter Date: 07/24/2021 ? ? PT End of Session - 07/24/21 1431   ? ? Visit Number 3   ? Number of Visits 4   ? Date for PT Re-Evaluation 07/31/21   ? Authorization Type Medicaid [place]   ? Authorization - Visit Number 3   ? Authorization - Number of Visits 4   ? Progress Note Due on Visit 4   ? PT Start Time 1355   ? PT Stop Time 1430   pt had to leave early for appointment  ? PT Time Calculation (min) 35 min   ? Activity Tolerance Patient tolerated treatment well   ? Behavior During Therapy [facility] for tasks assessed/performed   ? ?  ?  ? ?  ? ? ?Past Medical History:  ?Diagnosis Date  ? Anxiety   ? Cannabinoid hyperemesis syndrome   ? Depression   ? GERD (gastroesophageal reflux disease)   ? History of anemia   ? during pregnancy  ? History of gestational diabetes   ? Ovarian cyst   ? left  ? Pelvic pain   ? Upper respiratory symptom   ? symptoms started 02-16-2021 cough/ runny nose;  pcp visit 03-10-2021 with sore throat/ congestion/ sob/ body aches/ cough but no fever, negative covid/ flu/ strep resultsin epic  (03-13-2021  pt stated today only has nonproductive cough)  ? ? ?Past Surgical History:  ?Procedure Laterality Date  ? BIOPSY  10/22/2020  ? Procedure: BIOPSY;  Surgeon: Shellia Cleverly, DO;  [facility]: [facility];  Service: Gastroenterology;;  ? CHOLECYSTECTOMY N/A 12/02/2016  ? Procedure: LAPAROSCOPIC CHOLECYSTECTOMY WITH INTRAOPERATIVE CHOLANGIOGRAM;  Surgeon: Ovidio Kin, MD;  Location: [facility];  Service: General;  Laterality: N/A;  ? COLONOSCOPY WITH PROPOFOL N/A 10/22/2020  ? Procedure: COLONOSCOPY WITH PROPOFOL;  Surgeon: Shellia Cleverly, DO;  [facility]: [facility];  Service:  Gastroenterology;  Laterality: N/A;  ? ESOPHAGOGASTRODUODENOSCOPY (EGD) WITH PROPOFOL N/A 10/22/2020  ? Procedure: ESOPHAGOGASTRODUODENOSCOPY (EGD) WITH PROPOFOL;  Surgeon: Shellia Cleverly, DO;  [facility];  Service: Gastroenterology;  Laterality: N/A;  ? ESOPHAGOGASTRODUODENOSCOPY (EGD) WITH PROPOFOL N/A 10/22/2020  ? Procedure: ESOPHAGOGASTRODUODENOSCOPY (EGD) WITH PROPOFOL;  Surgeon: Shellia Cleverly, DO;  [facility];  Service: Gastroenterology;  Laterality: N/A;  ? LAPAROSCOPIC OVARIAN CYSTECTOMY Left 03/17/2021  ? Procedure: LAPAROSCOPIC Resection of Adnexal Mass;  Surgeon: Willodean Rosenthal, MD;  Location: [facility];  Service: Gynecology;  Laterality: Left;  ? LAPAROSCOPIC SALPINGO OOPHERECTOMY Bilateral 03/17/2021  ? Procedure: LAPAROSCOPIC BIALTERAL SALPINGECTOMY;  Surgeon: Willodean Rosenthal, MD;  Location: [facility];  Service: Gynecology;  Laterality: Bilateral;  ? TUBAL LIGATION    ? pt unsure of affected side (procedure on 03/17/21)  ? WISDOM TOOTH EXTRACTION    ? ? ?There were no vitals filed for this visit. ? ? Subjective Assessment - 07/24/21 1356   ? ? Subjective Pt states she has not tried walking in a shoe at home, she wanted to wait for appointment.  She states she feels like she is moving around well at home with the boot   ? Patient Stated Goals return to walking and working   ? Currently in Pain? No/denies   ? ?  ?  ? ?  ? ? ? ? ? ? ? ? ? ? ? ? ? ? ? ? ? ? ? ?  [facility] Adult PT Treatment/Exercise - 07/24/21 0001   ? ?  ? Ambulation/Gait  ? Gait Comments gait with shoe forwards, backwards, sideways with improving heel strike and stpe through gait wtih cuing   ?  ? Ankle Exercises: Aerobic  ? Nustep L5 x 4 min for warm up   ?  ? Ankle Exercises: Stretches  ? Soleus Stretch 2 reps;20 seconds   ? Gastroc Stretch 2 reps;20 seconds   ?  ? Ankle Exercises: Standing  ? SLS Lt LE up to 10 seconds intermittent UE support   ? Heel Raises  --   attempted. difficult with Lt LE  ? Toe Raise 15 reps   ? Side Shuffle (Round Trip) side step red TB around feet 6' x 5   ?  ? Ankle Exercises: Supine  ? T-Band ankle 4 way x 20 red TB for PF/DF, yellow ev/inv   ?  ? Ankle Exercises: Seated  ? Heel Raises 20 reps   5# KB on knee  ? BAPS Level 2;Sitting;10 reps   CW/CCW  ? ?  ?  ? ?  ? ? ? ? ? ? ? ? ? ? ? ? PT Short Term Goals - 07/03/21 1436   ? ?  ? PT SHORT TERM GOAL #1  ? Title Pt will be independent with initial HEP   ? Baseline does not have HEP   ? Time 4   ? Period Weeks   ? Status New   ? Target Date 07/31/21   ?  ? PT SHORT TERM GOAL #2  ? Title Pt will perform gait with good mechanics with LRAD with boot x 5 minutes   ? Baseline Pt able to perform gait 10-20'   ? Time 4   ? Period Weeks   ? Status New   ? Target Date 07/31/21   ? ?  ?  ? ?  ? ? ? ? PT Long Term Goals - 07/03/21 1438   ? ?  ? PT LONG TERM GOAL #1  ? Title Pt will be independent with advanced HEP   ? Baseline does not have HEP   ? Time 8   ? Period Weeks   ? Status New   ? Target Date 08/28/21   ?  ? PT LONG TERM GOAL #2  ? Title Pt will improve Lt ankle strength to 4+/5 to perform job duties with decreased pain   ? Baseline 3-/5   ? Time 8   ? Period Weeks   ? Status New   ? Target Date 08/28/21   ?  ? PT LONG TERM GOAL #3  ? Title Pt will perform gait wearing shoe without AD x 15 minutes to improve community mobility   ? Baseline using knee scooter and walking boot   ? Time 8   ? Period Weeks   ? Status New   ? Target Date 08/28/21   ? ?  ?  ? ?  ? ? ? ? ? ? ? ? Plan - 07/24/21 1432   ? ? Clinical Impression Statement Pt with good tolerance to gait with shoe. She requires cues for heel/toe and step through pattern. PT encouraged pt to perform gait household distances with shoe at home   ? PT Next Visit Plan gait and standing balance in shoe, ankle strength and ROM   ? PT Home Exercise Plan [email or web address]   ? Consulted and Agree with Plan of Care Patient   ? ?  ?  ? ?  ? ? ?  Patient will  benefit from skilled therapeutic intervention in order to improve the following deficits and impairments:    ? ?Visit Diagnosis: ?Muscle weakness (generalized) ? ?Difficulty in walking, not elsewhere classified ? ?Pain in left ankle and joints of left foot ? ? ? ? ?Problem List ?Patient Active Problem List  ? Diagnosis Date Noted  ? Pelvic pain in female 03/17/2021  ? Request for sterilization 03/17/2021  ? Gastroesophageal reflux disease 03/04/2021  ? Pain of upper abdomen   ? Non-intractable vomiting   ? Family history- stomach cancer   ? Diarrhea   ? Change in bowel habits   ? Depression, major, single episode, mild (HCC) 01/23/2020  ? Capsulitis of right shoulder 10/05/2019  ? GAD (generalized anxiety disorder) 04/20/2019  ? Dermatitis 05/10/2018  ? Closed fracture of tooth 05/10/2018  ? Chronic midline low back pain without sciatica 07/21/2016  ? BMI 45.0-49.9, adult (HCC) 04/22/2016  ? Depression, recurrent (HCC) 04/22/2016  ? Adnexal cyst 03/25/2016  ? ? ?Latif Nazareno, PT ?07/24/2021, 2:33 PM ? ?[place] [facility] ?[address] ?[place], [place], [address] ?Phone: [phone]   Fax:  [phone] ? ?Name: Christine Trujillo ?MRN: [id number] ?Date of Birth: 04/02/1996 ? ? ? ?

## 2021-07-29 ENCOUNTER — Ambulatory Visit: Payer: Medicaid Other | Admitting: Physical Therapy

## 2021-07-29 DIAGNOSIS — M25572 Pain in left ankle and joints of left foot: Secondary | ICD-10-CM

## 2021-07-29 DIAGNOSIS — M6281 Muscle weakness (generalized): Secondary | ICD-10-CM | POA: Diagnosis not present

## 2021-07-29 DIAGNOSIS — R262 Difficulty in walking, not elsewhere classified: Secondary | ICD-10-CM

## 2021-07-29 NOTE — Patient Instructions (Signed)
Access Code: [email or web address] ?URL: [email or web address] ?Date: 07/29/2021 ?Prepared by: Reggy Eye ? ?Exercises ?- Ankle Inversion Eversion Towel Slide  - 1 x daily - 7 x weekly - 3 sets - 10 reps ?- Long Sitting Calf Stretch with Strap  - 1 x daily - 7 x weekly - 1 sets - 3 reps - 20-30 seconds hold ?- Ankle Inversion with Resistance  - 1 x daily - 7 x weekly - 3 sets - 10 reps ?- Ankle Eversion with Resistance  - 1 x daily - 7 x weekly - 3 sets - 10 reps ?- Ankle Dorsiflexion with Resistance  - 1 x daily - 7 x weekly - 3 sets - 10 reps ?- Ankle and Toe Plantarflexion with Resistance  - 1 x daily - 7 x weekly - 3 sets - 10 reps ?- Gastroc Stretch on Wall  - 1 x daily - 7 x weekly - 1 sets - 3 reps - 20-30 sec hold ?- Soleus Stretch on Wall  - 1 x daily - 7 x weekly - 1 sets - 3 reps - 20-30 sec hold ?- Single Leg Stance with Support  - 1 x daily - 7 x weekly - 1 sets - 3 reps - 20-30 sec hold ?

## 2021-07-29 NOTE — Therapy (Addendum)
[place], [place], [address] Phone: [phone]   Fax:  [phone]  Physical Therapy Treatment and Discharge  Patient Details  Name: Christine Trujillo MRN: [id number] Date of Birth: [birth year] Referring Provider (PT): babcock, sharon   Encounter Date: 07/29/2021   PT End of Session - 07/29/21 1137     Visit Number 4    Number of Visits 4    Date for PT Re-Evaluation 07/31/21    Authorization Type Medicaid [place]    Authorization - Visit Number 4    Authorization - Number of Visits 4    Progress Note Due on Visit 4    PT Start Time 1100    PT Stop Time 1138    PT Time Calculation (min) 38 min    Activity Tolerance Patient tolerated treatment well    Behavior During Therapy [facility] for tasks assessed/performed             Past Medical History:  Diagnosis Date   Anxiety    Cannabinoid hyperemesis syndrome    Depression    GERD (gastroesophageal reflux disease)    History of anemia    during pregnancy   History of gestational diabetes    Ovarian cyst    left   Pelvic pain    Upper respiratory symptom    symptoms started 02-16-2021 cough/ runny nose;  pcp visit 03-10-2021 with sore throat/ congestion/ sob/ body aches/ cough but no fever, negative covid/ flu/ strep resultsin epic  (03-13-2021  pt stated today only has nonproductive cough)    Past Surgical History:  Procedure Laterality Date   BIOPSY  10/22/2020   Procedure: BIOPSY;  Surgeon: Lavena Bullion, DO;  [facility];  Service: Gastroenterology;;   CHOLECYSTECTOMY N/A 12/02/2016   Procedure: LAPAROSCOPIC CHOLECYSTECTOMY WITH INTRAOPERATIVE CHOLANGIOGRAM;  Surgeon: Alphonsa Overall, MD;  [facility];  Service: General;  Laterality: N/A;   COLONOSCOPY WITH PROPOFOL N/A 10/22/2020   Procedure: COLONOSCOPY WITH PROPOFOL;  Surgeon: Lavena Bullion, DO;  [facility];  Service: Gastroenterology;  Laterality:  N/A;   ESOPHAGOGASTRODUODENOSCOPY (EGD) WITH PROPOFOL N/A 10/22/2020   Procedure: ESOPHAGOGASTRODUODENOSCOPY (EGD) WITH PROPOFOL;  Surgeon: Lavena Bullion, DO;  [facility];  Service: Gastroenterology;  Laterality: N/A;   ESOPHAGOGASTRODUODENOSCOPY (EGD) WITH PROPOFOL N/A 10/22/2020   Procedure: ESOPHAGOGASTRODUODENOSCOPY (EGD) WITH PROPOFOL;  Surgeon: Lavena Bullion, DO;  [facility];  Service: Gastroenterology;  Laterality: N/A;   LAPAROSCOPIC OVARIAN CYSTECTOMY Left 03/17/2021   Procedure: LAPAROSCOPIC Resection of Adnexal Mass;  Surgeon: Lavonia Drafts, MD;  Location[facility];  Service: Gynecology;  Laterality: Left;   LAPAROSCOPIC SALPINGO OOPHERECTOMY Bilateral 03/17/2021   Procedure: LAPAROSCOPIC BIALTERAL SALPINGECTOMY;  Surgeon: Lavonia Drafts, MD;  Location: [place];  Service: Gynecology;  Laterality: Bilateral;   TUBAL LIGATION     pt unsure of affected side (procedure on 03/17/21)   WISDOM TOOTH EXTRACTION      There were no vitals filed for this visit.   Subjective Assessment - 07/29/21 1059     Subjective Pt states she feels she walks slower with a shoe on so she prefers the boot. She has been using shoe occasionally at home    Patient Stated Goals return to walking and working    Currently in Pain? No/denies                [facility] PT Assessment - 07/29/21 0001       Assessment  Medical Diagnosis Lt tibia fracture    Referring Provider (PT) babcock, sharon    Next MD Visit 08/06/21      AROM   Left Ankle Dorsiflexion 0    Left Ankle Plantar Flexion 35    Left Ankle Inversion 8    Left Ankle Eversion 12      Strength   Left Ankle Dorsiflexion 3+/5    Left Ankle Plantar Flexion 4-/5    Left Ankle Inversion 3-/5    Left Ankle Eversion 3/5                           [facility] Adult PT Treatment/Exercise - 07/29/21 0001       Ankle Exercises: Aerobic   Nustep  L5 x 4 min for warm up      Ankle Exercises: Supine   T-Band ankle 4 way x 20 red TB for PF/DF, yellow ev/inv      Ankle Exercises: Seated   Heel Raises 20 reps   5# KB on knee   BAPS Level 2;Sitting;10 reps   CW/CCW     Ankle Exercises: Standing   SLS Lt LE up to 10 seconds intermittent UE support    Rocker Board 2 minutes   A/P   Side Shuffle (Round Trip) side step red TB around feet 6' x 5      Ankle Exercises: Stretches   Soleus Stretch 2 reps;20 seconds   standing   Gastroc Stretch 2 reps;20 seconds   standing                    PT Education - 07/29/21 1129     Education Details updated HEP    Person(s) Educated Patient    Methods Explanation;Demonstration;Handout    Comprehension Returned demonstration;Verbalized understanding              PT Short Term Goals - 07/29/21 1109       PT SHORT TERM GOAL #1   Title Pt will be independent with initial HEP    Status Achieved      PT SHORT TERM GOAL #2   Title Pt will perform gait with good mechanics with LRAD with boot x 5 minutes    Status Achieved               PT Long Term Goals - 07/29/21 1108       PT LONG TERM GOAL #1   Title Pt will be independent with advanced HEP    Status On-going    Target Date 08/28/21      PT LONG TERM GOAL #2   Title Pt will improve Lt ankle strength to 4+/5 to perform job duties with decreased pain    Status On-going    Target Date 08/28/21      PT LONG TERM GOAL #3   Title Pt will perform gait wearing shoe without AD x 15 minutes to improve community mobility    Status Achieved                   Plan - 07/29/21 1138     Clinical Impression Statement Pt is progressing towards goals. She is improving strength and AROM but continues with significant weakness in ankle inversion/eversion and is not walking in shoe full time. Pt will benefit from continued PT services to progress towards remaining goals    PT Frequency 1x / week    PT Duration 6  weeks    PT Treatment/Interventions Aquatic Therapy;Cryotherapy;Moist Heat;Iontophoresis 28m/ml Dexamethasone;Electrical Stimulation;Therapeutic exercise;Patient/family education;Manual techniques;Passive range of motion;Dry needling;Taping;Neuromuscular re-education;Balance training;Gait training;Stair training    PT Next Visit Plan gait and standing balance in shoe, ankle strength and ROM    PT Home Exercise Plan [email or web address]    Consulted and Agree with Plan of Care Patient             Patient will benefit from skilled therapeutic intervention in order to improve the following deficits and impairments:     Visit Diagnosis: Muscle weakness (generalized)  Difficulty in walking, not elsewhere classified  Pain in left ankle and joints of left foot     Problem List Patient Active Problem List   Diagnosis Date Noted   Pelvic pain in female 03/17/2021   Request for sterilization 03/17/2021   Gastroesophageal reflux disease 03/04/2021   Pain of upper abdomen    Non-intractable vomiting    Family history- stomach cancer    Diarrhea    Change in bowel habits    Depression, major, [place] episode, mild ([place] 01/23/2020   Capsulitis of right shoulder 10/05/2019   GAD (generalized anxiety disorder) 04/20/2019   Dermatitis 05/10/2018   Closed fracture of tooth 05/10/2018   Chronic midline low back pain without sciatica 07/21/2016   BMI 45.0-49.9, adult ([place] 04/22/2016   Depression, recurrent ([place] 04/22/2016   Adnexal cyst 03/25/2016   PHYSICAL THERAPY DISCHARGE SUMMARY  Visits from Start of Care: 4  Current functional level related to goals / functional outcomes: Improved activity tolerance, gait   Remaining deficits: Decreased strength and ROM   Education / Equipment: HEP   Patient agrees to discharge. Patient goals were partially met. Patient is being discharged due to being pleased with the current functional level.  KIsabelle Trujillo PT,DPT05/24/231:26  PM   Christine Trujillo, PT 07/29/2021, 11:40 AM  [facility] [address]Phone: [phone]  Fax:  [phone] Name: Christine CaneloMRN: [id number] of Birth: 705/22/1997

## 2021-09-09 ENCOUNTER — Ambulatory Visit: Payer: Medicaid Other | Admitting: Family Medicine

## 2021-09-09 ENCOUNTER — Encounter: Payer: Self-pay | Admitting: Family Medicine

## 2021-09-09 VITALS — BP 122/80 | HR 76 | Temp 98.2°F | Ht 63.0 in | Wt 271.1 lb

## 2021-09-09 DIAGNOSIS — N939 Abnormal uterine and vaginal bleeding, unspecified: Secondary | ICD-10-CM | POA: Diagnosis not present

## 2021-09-09 LAB — IBC + FERRITIN
Ferritin: 21.3 ng/mL (ref 10.0–291.0)
Iron: 35 ug/dL — ABNORMAL LOW (ref 42–145)
Saturation Ratios: 9.4 % — ABNORMAL LOW (ref 20.0–50.0)
TIBC: 372.4 ug/dL (ref 250.0–450.0)
Transferrin: 266 mg/dL (ref 212.0–360.0)

## 2021-09-09 LAB — CBC
HCT: 36.8 % (ref 36.0–46.0)
Hemoglobin: 12.1 g/dL (ref 12.0–15.0)
MCHC: 32.9 g/dL (ref 30.0–36.0)
MCV: 83.4 fl (ref 78.0–100.0)
Platelets: 320 10*3/uL (ref 150.0–400.0)
RBC: 4.41 Mil/uL (ref 3.87–5.11)
RDW: 13.8 % (ref 11.5–15.5)
WBC: 7.9 10*3/uL (ref 4.0–10.5)

## 2021-09-09 LAB — TSH: TSH: 0.67 u[IU]/mL (ref 0.35–5.50)

## 2021-09-09 LAB — HCG, QUANTITATIVE, PREGNANCY: Quantitative HCG: <0.6 m[IU]/mL

## 2021-09-09 MED ORDER — MEDROXYPROGESTERONE ACETATE 150 MG/ML IM SUSP
150.0000 mg | Freq: Once | INTRAMUSCULAR | Status: AC
  Administered 2021-09-09: 150 mg via INTRAMUSCULAR

## 2021-09-09 MED ORDER — NAPROXEN 500 MG PO TABS: ORAL_TABLET | ORAL | 2 refills | Status: DC

## 2021-09-09 NOTE — Patient Instructions (Signed)
Give [place] 2-3 business days to get the results of your labs back.   Take an over the counter iron supplement (Ferrous sulfate) 1-2 times daily as your stomach will allow  Iron Rich foods:  Red meat Prunes Spinach Liver Cream of Wheat  Moderate sources: Iron-fortified cereals (bran, etc) Almonds Beans/peas Pork Lamb Ham Scallops [place] Peaches Peanuts  Let us know if you need anything.

## 2021-09-09 NOTE — Progress Notes (Signed)
Chief Complaint  Patient presents with   Menorrhagia    Subjective: Patient is a 26 y.o. female here for AUB.  Pt stopped Depo injections 6 mo ago. Started having heavy cycles 3 mo ago. Started bleeding 2 d ago. Heavy and lasting for 10-14 d. She will sometimes get lightheaded and dizzy. She has not been taking PO iron. When she was on the Depo shot, she had no bleeding. Failed arm implant, OCPs, IUD. Recently had tubal ligation. Having associated cramping. Took Aleve, Tylenol, Aspirin without relief.   Objective: BP 122/80   Pulse 76   Temp 98.2 F (36.8 C) (Oral)   Ht 5\' 3"  (1.6 m)   Wt 271 lb 2 oz (123 kg)   SpO2 99%   BMI 48.03 kg/m  General: Awake, appears stated age HEENT: MMM, no subglossal pallor Heart: RRR, no LE edema Lungs: CTAB, no rales, wheezes or rhonchi. No accessory muscle use Abd: BS+, S, NT, ND Psych: Age appropriate judgment and insight, normal affect and mood  Assessment and Plan: Abnormal uterine bleeding (AUB) - Plan: CBC, IBC + Ferritin, TSH, B-HCG Quant, naproxen (NAPROSYN) 500 MG tablet, medroxyPROGESTERone (DEPO-PROVERA) injection 150 mg  Chronic, worsening. Go back on Depo shot for now. Ck labs as above. Fe rec'd. Go back to GYN to discuss indication for hysterectomy. F/u as originally scheduled.  The patient voiced understanding and agreement to the plan.  [place], DO 09/09/21  9:32 AM

## 2021-09-11 ENCOUNTER — Institutional Professional Consult (permissible substitution): Payer: Medicaid Other | Admitting: Obstetrics and Gynecology

## 2021-11-12 ENCOUNTER — Encounter: Payer: Self-pay | Admitting: General Practice

## 2021-11-30 ENCOUNTER — Telehealth: Payer: Self-pay

## 2021-11-30 NOTE — Telephone Encounter (Signed)
Called the patient informed of PCP instructions She stated she has been to her surgeon and they did not update xrays/. She is going to the ED today.

## 2021-11-30 NOTE — Telephone Encounter (Signed)
Pt called in states she has a swollen ankle where she had surgery pain level is 11. Pt would like a x-ray place.

## 2021-12-08 ENCOUNTER — Ambulatory Visit (INDEPENDENT_AMBULATORY_CARE_PROVIDER_SITE_OTHER): Payer: Medicaid Other

## 2021-12-08 DIAGNOSIS — Z3042 Encounter for surveillance of injectable contraceptive: Secondary | ICD-10-CM | POA: Diagnosis not present

## 2021-12-08 MED ORDER — MEDROXYPROGESTERONE ACETATE 150 MG/ML IM SUSP
150.0000 mg | Freq: Once | INTRAMUSCULAR | Status: AC
  Administered 2021-12-08: 150 mg via INTRAMUSCULAR

## 2021-12-08 NOTE — Progress Notes (Addendum)
Pt here today for Depo Provera injection per Dr. Carmelia Roller.  Date last pap: 02/02/2021 . Last Depo-Provera: 09/09/2021. Side Effects if any: None. Serum HCG indicated? No. Depo-Provera 150 mg administered IM L deltoid  Next appointment due November 14-28. Nurse visit scheduled on 02/26/22.

## 2021-12-15 ENCOUNTER — Other Ambulatory Visit: Payer: Self-pay

## 2021-12-15 ENCOUNTER — Encounter (HOSPITAL_BASED_OUTPATIENT_CLINIC_OR_DEPARTMENT_OTHER): Payer: Self-pay | Admitting: Emergency Medicine

## 2021-12-15 ENCOUNTER — Emergency Department (HOSPITAL_BASED_OUTPATIENT_CLINIC_OR_DEPARTMENT_OTHER)
Admission: EM | Admit: 2021-12-15 | Discharge: 2021-12-15 | Disposition: A | Discharge status: Home / Self Care | Payer: Medicaid Other | Attending: Emergency Medicine | Admitting: Emergency Medicine

## 2021-12-15 ENCOUNTER — Emergency Department (HOSPITAL_BASED_OUTPATIENT_CLINIC_OR_DEPARTMENT_OTHER): Payer: Medicaid Other

## 2021-12-15 DIAGNOSIS — R109 Unspecified abdominal pain: Secondary | ICD-10-CM | POA: Insufficient documentation

## 2021-12-15 DIAGNOSIS — R112 Nausea with vomiting, unspecified: Secondary | ICD-10-CM | POA: Diagnosis not present

## 2021-12-15 DIAGNOSIS — D72829 Elevated white blood cell count, unspecified: Secondary | ICD-10-CM | POA: Diagnosis not present

## 2021-12-15 LAB — CBC WITH DIFFERENTIAL/PLATELET
Abs Immature Granulocytes: 0.11 10*3/uL — ABNORMAL HIGH (ref 0.00–0.07)
Basophils Absolute: 0 10*3/uL (ref 0.0–0.1)
Basophils Relative: 0 %
Eosinophils Absolute: 0 10*3/uL (ref 0.0–0.5)
Eosinophils Relative: 0 %
HCT: 38.2 % (ref 36.0–46.0)
Hemoglobin: 12.8 g/dL (ref 12.0–15.0)
Immature Granulocytes: 1 %
Lymphocytes Relative: 7 %
Lymphs Abs: 1.3 10*3/uL (ref 0.7–4.0)
MCH: 27.2 pg (ref 26.0–34.0)
MCHC: 33.5 g/dL (ref 30.0–36.0)
MCV: 81.1 fL (ref 80.0–100.0)
Monocytes Absolute: 0.8 10*3/uL (ref 0.1–1.0)
Monocytes Relative: 4 %
Neutro Abs: 16.7 10*3/uL — ABNORMAL HIGH (ref 1.7–7.7)
Neutrophils Relative %: 88 %
Platelets: 370 10*3/uL (ref 150–400)
RBC: 4.71 MIL/uL (ref 3.87–5.11)
RDW: 14.6 % (ref 11.5–15.5)
WBC: 18.9 10*3/uL — ABNORMAL HIGH (ref 4.0–10.5)
nRBC: 0 % (ref 0.0–0.2)

## 2021-12-15 LAB — COMPREHENSIVE METABOLIC PANEL
ALT: 18 U/L (ref 0–44)
AST: 23 U/L (ref 15–41)
Albumin: 4.2 g/dL (ref 3.5–5.0)
Alkaline Phosphatase: 78 U/L (ref 38–126)
Anion gap: 11 (ref 5–15)
BUN: 11 mg/dL (ref 6–20)
CO2: 19 mmol/L — ABNORMAL LOW (ref 22–32)
Calcium: 8.8 mg/dL — ABNORMAL LOW (ref 8.9–10.3)
Chloride: 102 mmol/L (ref 98–111)
Creatinine, Ser: 0.84 mg/dL (ref 0.44–1.00)
GFR, Estimated: >60 mL/min (ref >60)
Glucose, Bld: 140 mg/dL — ABNORMAL HIGH (ref 70–99)
Potassium: 3 mmol/L — ABNORMAL LOW (ref 3.5–5.1)
Sodium: 132 mmol/L — ABNORMAL LOW (ref 135–145)
Total Bilirubin: 1.4 mg/dL — ABNORMAL HIGH (ref 0.3–1.2)
Total Protein: 8 g/dL (ref 6.5–8.1)

## 2021-12-15 LAB — LIPASE, BLOOD: Lipase: 21 U/L (ref 11–51)

## 2021-12-15 LAB — HCG, SERUM, QUALITATIVE: Preg, Serum: NEGATIVE

## 2021-12-15 MED ORDER — ONDANSETRON HCL 4 MG/2ML IJ SOLN
4.0000 mg | Freq: Once | INTRAMUSCULAR | Status: AC
  Administered 2021-12-15: 4 mg via INTRAVENOUS
  Filled 2021-12-15: qty 2

## 2021-12-15 MED ORDER — SODIUM CHLORIDE 0.9 % IV BOLUS
1000.0000 mL | Freq: Once | INTRAVENOUS | Status: AC
  Administered 2021-12-15: 1000 mL via INTRAVENOUS

## 2021-12-15 MED ORDER — PROMETHAZINE HCL 25 MG PO TABS: 25.0000 mg | ORAL_TABLET | Freq: Four times a day (QID) | ORAL | 0 refills | Status: DC | PRN

## 2021-12-15 MED ORDER — HALOPERIDOL LACTATE 5 MG/ML IJ SOLN
5.0000 mg | Freq: Once | INTRAMUSCULAR | Status: AC
  Administered 2021-12-15: 5 mg via INTRAVENOUS
  Filled 2021-12-15: qty 1

## 2021-12-15 NOTE — ED Triage Notes (Signed)
Patient arrived via POV c/o N/V/D x 1 day. Patient states 10+ episodes of emesis in last 24 hrs. Patient states taking Zofran and phenergan prior to arrival, with no relief. Patient with acive emesis in lobby. Patient states 10/10 pain. Patient is AO x 4, VS WDL, slow gait.

## 2021-12-15 NOTE — ED Provider Notes (Signed)
[facility] EMERGENCY DEPARTMENT Provider Note   CSN: [id number] Arrival date & time: 12/15/21  1925     History  Chief Complaint  Patient presents with   Emesis    Christine Trujillo is a 26 y.o. female.  Here in patient here with nausea vomiting abdominal pain.  History of hyperemesis from marijuana.  History of depression.  Nothing makes it worse or better.  Cramping belly pain everywhere.  Denies any fevers or chills.  Denies any chest pain, shortness of breath.  No sick contacts.  No diarrhea.  Denies any vaginal bleeding or discharge.  No concern for pregnancy.  The history is provided by the patient.       Home Medications Prior to Admission medications   Medication Sig Start Date End Date Taking? Authorizing Provider  promethazine (PHENERGAN) 25 MG tablet Take 1 tablet (25 mg total) by mouth every 6 (six) hours as needed for up to 20 doses for nausea or vomiting. 12/15/21  Yes Ahriana Gunkel, DO  Biotin w/ Vitamins C & E (HAIR/SKIN/NAILS PO) Take 1 tablet by mouth daily.    [provider]  Multiple Vitamins-Minerals (MULTIVITAMIN WITH MINERALS) tablet Take 1 tablet by mouth daily.    [provider]  naproxen (NAPROSYN) 500 MG tablet Take 1 tab twice daily during heavy cycles. 09/09/21   Sharlene Dory, DO  ondansetron (ZOFRAN-ODT) 4 MG disintegrating tablet Take 1 tablet (4 mg total) by mouth every 8 (eight) hours as needed for nausea or vomiting. 07/10/21   Pollyann Savoy, MD      Allergies    Patient has no known allergies.    Review of Systems   Review of Systems  Physical Exam Updated Vital Signs BP 102/70   Pulse 62   Temp (!) 97.3 F (36.3 C) (Oral)   Resp (!) 24   Ht 5\' 4"  (1.626 m)   Wt 127 kg   SpO2 99%   BMI 48.06 kg/m  Physical Exam Vitals and nursing note reviewed.  Constitutional:      General: She is not in acute distress.    Appearance: She is well-developed.  HENT:     Head: Normocephalic and  atraumatic.     Nose: Nose normal.     Mouth/Throat:     Mouth: Mucous membranes are moist.  Eyes:     Extraocular Movements: Extraocular movements intact.     Conjunctiva/sclera: Conjunctivae normal.     Pupils: Pupils are equal, round, and reactive to light.  Cardiovascular:     Rate and Rhythm: Normal rate and regular rhythm.     Pulses: Normal pulses.     Heart sounds: Normal heart sounds. No murmur heard. Pulmonary:     Effort: Pulmonary effort is normal. No respiratory distress.     Breath sounds: Normal breath sounds.  Abdominal:     Palpations: Abdomen is soft.     Tenderness: There is abdominal tenderness.  Musculoskeletal:        General: No swelling.     Cervical back: Neck supple.  Skin:    General: Skin is warm and dry.     Capillary Refill: Capillary refill takes less than 2 seconds.  Neurological:     General: No focal deficit present.     Mental Status: She is alert and oriented to person, place, and time.  Psychiatric:        Mood and Affect: Mood normal.     ED Results / Procedures / Treatments  Labs (all labs ordered are listed, but only abnormal results are displayed) Labs Reviewed  CBC WITH DIFFERENTIAL/PLATELET - Abnormal; Notable for the following components:      Result Value   WBC 18.9 (*)    Neutro Abs 16.7 (*)    Abs Immature Granulocytes 0.11 (*)    All other components within normal limits  COMPREHENSIVE METABOLIC PANEL - Abnormal; Notable for the following components:   Sodium 132 (*)    Potassium 3.0 (*)    CO2 19 (*)    Glucose, Bld 140 (*)    Calcium 8.8 (*)    Total Bilirubin 1.4 (*)    All other components within normal limits  LIPASE, BLOOD  HCG, SERUM, QUALITATIVE    EKG EKG Interpretation  Date/Time:  Tuesday December 15 2021 21:17:32 EDT Ventricular Rate:  60 PR Interval:  132 QRS Duration: 100 QT Interval:  479 QTC Calculation: 479 R Axis:   69 Text Interpretation: Sinus arrhythmia Borderline prolonged QT  interval Confirmed by Virgina Norfolk ([id number]) on 12/15/2021 9:19:14 PM  Radiology CT Renal Stone Study  Result Date: 12/15/2021 CLINICAL DATA:  Flank pain, kidney stone suspected EXAM: CT ABDOMEN AND PELVIS WITHOUT CONTRAST TECHNIQUE: Multidetector CT imaging of the abdomen and pelvis was performed following the standard protocol without IV contrast. RADIATION DOSE REDUCTION: This exam was performed according to the departmental dose-optimization program which includes automated exposure control, adjustment of the mA and/or kV according to patient size and/or use of iterative reconstruction technique. COMPARISON:  CT 07/09/2021 FINDINGS: Lower chest: No basilar consolidation or pleural effusion. Hepatobiliary: Mild diffuse hepatic steatosis. No evidence of focal liver lesion on this unenhanced exam Clips in the gallbladder fossa postcholecystectomy. No biliary dilatation. Pancreas: No ductal dilatation or inflammation. Spleen: Normal in size without focal abnormality. Adrenals/Urinary Tract: Normal adrenal glands. No hydronephrosis, renal calculi, or perinephric edema. Both ureters are decompressed. Urinary bladder is partially distended, no wall thickening or stone. Stomach/Bowel: Unremarkable appearance of the stomach. Occasional fluid throughout nondilated small bowel, no obstruction or inflammation. Normal appendix. Chronic submucosal fatty infiltration of the ascending colon. Remainder of the colon is decompressed limiting detailed assessment. There is no pericolonic edema. Vascular/Lymphatic: Normal caliber abdominal aorta.  No adenopathy. Reproductive: Uterus and bilateral adnexa are unremarkable. Previous right ovarian cyst is no longer seen. Other: No free air, free fluid, or intra-abdominal fluid collection. Musculoskeletal: There are no acute or suspicious osseous abnormalities. IMPRESSION: 1. No renal stones or obstructive uropathy.  No acute findings. 2. Mild hepatic steatosis. Electronically Signed    By: Narda Rutherford M.D.   On: 12/15/2021 23:40    Procedures Procedures    Medications Ordered in ED Medications  ondansetron [facility]) injection 4 mg (4 mg Intravenous Given 12/15/21 2107)  sodium chloride 0.9 % bolus 1,000 mL (1,000 mLs Intravenous New Bag/Given 12/15/21 2108)  haloperidol lactate (HALDOL) injection 5 mg (5 mg Intravenous Given 12/15/21 2147)    ED Course/ Medical Decision Making/ A&P                           Medical Decision Making Amount and/or Complexity of Data Reviewed Labs: ordered. Radiology: ordered.  Risk Prescription drug management.   Christine Trujillo is here with abdominal pain, nausea, vomiting.  History of anxiety, depression, acid reflux, hyperemesis from marijuana use.  Patient with unremarkable vitals.  No fever.  She appears uncomfortable.  Diffuse abdominal pain on exam.  Admits to continued marijuana use.  Differential diagnosis is likely hyperemesis from marijuana use, less likely bowel obstruction or other intra-abdominal infectious process.  Could be dehydration or viral process as well.  We will check CBC, CMP, lipase, pregnancy test.  Will give IV fluids, IV Haldol, IV Zofran and CT scan abdomen pelvis.  Per my review and interpretation of labs there is no significant anemia or electrolyte abnormality.  She does have a leukocytosis of 18 but suspect that is from stress reaction from vomiting.  Pregnancy test is negative.  CT scan shows no acute process.  She is feeling much better after IV fluids and IV antiemetics.  Will prescribe Phenergan.  Educated her and encouraged her to discontinue marijuana use.  Discharged in good condition.  Understands return precautions.  This chart was dictated using voice recognition software.  Despite best efforts to proofread,  errors can occur which can change the documentation meaning.         Final Clinical Impression(s) / ED Diagnoses Final diagnoses:  Nausea and vomiting, unspecified vomiting type     Rx / DC Orders ED Discharge Orders          Ordered    promethazine (PHENERGAN) 25 MG tablet  Every 6 hours PRN        12/15/21 2341              Virgina Norfolk, DO 12/15/21 2343

## 2021-12-15 NOTE — ED Notes (Signed)
Pt c/o abdominal pain with n/v/d x 1 day.  Pt does smoke mariajuana, which may be the cause, EDP at bedside

## 2021-12-16 ENCOUNTER — Encounter (HOSPITAL_BASED_OUTPATIENT_CLINIC_OR_DEPARTMENT_OTHER): Payer: Self-pay

## 2021-12-16 ENCOUNTER — Emergency Department (HOSPITAL_BASED_OUTPATIENT_CLINIC_OR_DEPARTMENT_OTHER)
Admission: EM | Admit: 2021-12-16 | Discharge: 2021-12-16 | Disposition: A | Discharge status: Home / Self Care | Payer: Medicaid Other | Attending: Emergency Medicine | Admitting: Emergency Medicine

## 2021-12-16 ENCOUNTER — Other Ambulatory Visit (HOSPITAL_BASED_OUTPATIENT_CLINIC_OR_DEPARTMENT_OTHER): Payer: Self-pay

## 2021-12-16 DIAGNOSIS — F12188 Cannabis abuse with other cannabis-induced disorder: Secondary | ICD-10-CM | POA: Diagnosis not present

## 2021-12-16 DIAGNOSIS — R112 Nausea with vomiting, unspecified: Secondary | ICD-10-CM | POA: Diagnosis present

## 2021-12-16 LAB — CBC
HCT: 37.2 % (ref 36.0–46.0)
Hemoglobin: 12.3 g/dL (ref 12.0–15.0)
MCH: 26.5 pg (ref 26.0–34.0)
MCHC: 33.1 g/dL (ref 30.0–36.0)
MCV: 80.2 fL (ref 80.0–100.0)
Platelets: 359 10*3/uL (ref 150–400)
RBC: 4.64 MIL/uL (ref 3.87–5.11)
RDW: 14.6 % (ref 11.5–15.5)
WBC: 18.1 10*3/uL — ABNORMAL HIGH (ref 4.0–10.5)
nRBC: 0 % (ref 0.0–0.2)

## 2021-12-16 LAB — BASIC METABOLIC PANEL
Anion gap: 9 (ref 5–15)
BUN: 10 mg/dL (ref 6–20)
CO2: 21 mmol/L — ABNORMAL LOW (ref 22–32)
Calcium: 8.9 mg/dL (ref 8.9–10.3)
Chloride: 105 mmol/L (ref 98–111)
Creatinine, Ser: 0.86 mg/dL (ref 0.44–1.00)
GFR, Estimated: >60 mL/min (ref >60)
Glucose, Bld: 123 mg/dL — ABNORMAL HIGH (ref 70–99)
Potassium: 3.2 mmol/L — ABNORMAL LOW (ref 3.5–5.1)
Sodium: 135 mmol/L (ref 135–145)

## 2021-12-16 MED ORDER — POTASSIUM CHLORIDE CRYS ER 20 MEQ PO TBCR
40.0000 meq | EXTENDED_RELEASE_TABLET | Freq: Two times a day (BID) | ORAL | 0 refills | Status: DC
  Filled 2021-12-16 – 2022-01-14 (×2): qty 2, 1d supply, fill #0

## 2021-12-16 MED ORDER — SODIUM CHLORIDE 0.9 % IV BOLUS
1000.0000 mL | Freq: Once | INTRAVENOUS | Status: AC
  Administered 2021-12-16: 1000 mL via INTRAVENOUS

## 2021-12-16 MED ORDER — DROPERIDOL 2.5 MG/ML IJ SOLN
1.2500 mg | Freq: Once | INTRAMUSCULAR | Status: AC
  Administered 2021-12-16: 1.25 mg via INTRAVENOUS
  Filled 2021-12-16: qty 2

## 2021-12-16 MED ORDER — HALOPERIDOL LACTATE 5 MG/ML IJ SOLN
2.0000 mg | Freq: Once | INTRAMUSCULAR | Status: AC
  Administered 2021-12-16: 2 mg via INTRAVENOUS

## 2021-12-16 MED ORDER — SODIUM CHLORIDE 0.9 % IV BOLUS
500.0000 mL | Freq: Once | INTRAVENOUS | Status: AC
  Administered 2021-12-16: 500 mL via INTRAVENOUS

## 2021-12-16 MED ORDER — HALOPERIDOL LACTATE 5 MG/ML IJ SOLN
5.0000 mg | Freq: Once | INTRAMUSCULAR | Status: DC
  Filled 2021-12-16: qty 1

## 2021-12-16 MED ORDER — LORAZEPAM 2 MG/ML IJ SOLN
1.0000 mg | Freq: Once | INTRAMUSCULAR | Status: AC
  Administered 2021-12-16: 1 mg via INTRAVENOUS
  Filled 2021-12-16: qty 1

## 2021-12-16 NOTE — ED Notes (Signed)
Provider states that it is ok to give by iv  haloperidol

## 2021-12-16 NOTE — Discharge Instructions (Signed)
Please return to the ED with any new or worsening signs or symptoms Please pick up prescription for potassium chloride have sent into the pharmacy here at [facility] Please read attached guide concerning nausea and vomiting Please cease marijuana use. Please continue hydrating yourself as an outpatient

## 2021-12-16 NOTE — ED Provider Notes (Addendum)
[facility] EMERGENCY DEPARTMENT Provider Note   CSN: [id number] Arrival date & time: 12/16/21  5465     History  Chief Complaint  Patient presents with   Vomiting    Christine Trujillo is a 26 y.o. female with medical history of depression, GERD, anemia, cannabinoid hyperemesis syndrome.  Patient presents to ED for evaluation of nausea and vomiting.  Patient was seen in this ED yesterday for nausea and vomiting, provided with IV Zofran, Haldol and discharged home with Phenergan.  Cause of nausea and vomiting yesterday was determined to be related to marijuana use.  Patient reports that after discharge, she returned home and "took 1 hit of a blunt" and then drank some Pedialyte.  Patient reports that after she drank the Pedialyte she began experiencing nausea and vomiting.  Patient reports that she has had 7 episodes of nonbloody nonbilious emesis since being discharged yesterday.  The patient states that she attempted to take 3 tablets of 4 mg Zofran at home along with 50 mg Phenergan without relief.  Patient complaining of ongoing abdominal cramping, nausea and vomiting.  Patient denies any diarrhea, vaginal discharge, vaginal bleeding, fevers, constipation.  HPI     Home Medications Prior to Admission medications   Medication Sig Start Date End Date Taking? Authorizing Provider  potassium chloride SA (KLOR-CON M) 20 MEQ tablet Take 2 tablets (40 mEq total) by mouth 2 (two) times daily. 12/16/21  Yes Al Decant, PA-C  Biotin w/ Vitamins C & E (HAIR/SKIN/NAILS PO) Take 1 tablet by mouth daily.    [provider]  Multiple Vitamins-Minerals (MULTIVITAMIN WITH MINERALS) tablet Take 1 tablet by mouth daily.    [provider]  naproxen (NAPROSYN) 500 MG tablet Take 1 tab twice daily during heavy cycles. 09/09/21   Sharlene Dory, DO  ondansetron (ZOFRAN-ODT) 4 MG disintegrating tablet Take 1 tablet (4 mg total) by mouth every 8 (eight) hours as  needed for nausea or vomiting. 07/10/21   Pollyann Savoy, MD  promethazine (PHENERGAN) 25 MG tablet Take 1 tablet (25 mg total) by mouth every 6 (six) hours as needed for up to 20 doses for nausea or vomiting. 12/15/21   Virgina Norfolk, DO      Allergies    Patient has no known allergies.    Review of Systems   Review of Systems  Constitutional:  Negative for fever.  Gastrointestinal:  Positive for abdominal pain, nausea and vomiting. Negative for constipation and diarrhea.  Genitourinary:  Negative for vaginal discharge.    Physical Exam Updated Vital Signs BP (!) 153/88   Pulse 73   Temp 98 F (36.7 C) (Oral)   Resp 20   Ht 5\' 4"  (1.626 m)   Wt 127 kg   SpO2 100%   BMI 48.06 kg/m  Physical Exam Vitals and nursing note reviewed.  Constitutional:      General: She is not in acute distress.    Appearance: Normal appearance. She is not ill-appearing, toxic-appearing or diaphoretic.  HENT:     Head: Normocephalic and atraumatic.     Nose: Nose normal. No congestion.     Mouth/Throat:     Mouth: Mucous membranes are moist.     Pharynx: Oropharynx is clear.  Eyes:     Extraocular Movements: Extraocular movements intact.     Conjunctiva/sclera: Conjunctivae normal.     Pupils: Pupils are equal, round, and reactive to light.  Cardiovascular:     Rate and Rhythm: Normal rate and regular  rhythm.  Pulmonary:     Effort: Pulmonary effort is normal.     Breath sounds: Normal breath sounds. No wheezing.  Abdominal:     General: Abdomen is flat. Bowel sounds are normal.     Palpations: Abdomen is soft.     Tenderness: There is no abdominal tenderness. There is no right CVA tenderness or left CVA tenderness.     Comments: Abdomen soft and compressible throughout, no tenderness elicited  Musculoskeletal:     Cervical back: Normal range of motion and neck supple. No tenderness.  Skin:    General: Skin is warm and dry.     Capillary Refill: Capillary refill takes less than 2  seconds.  Neurological:     Mental Status: She is alert and oriented to person, place, and time.     ED Results / Procedures / Treatments   Labs (all labs ordered are listed, but only abnormal results are displayed) Labs Reviewed  CBC - Abnormal; Notable for the following components:      Result Value   WBC 18.1 (*)    All other components within normal limits  BASIC METABOLIC PANEL - Abnormal; Notable for the following components:   Potassium 3.2 (*)    CO2 21 (*)    Glucose, Bld 123 (*)    All other components within normal limits    EKG None  Radiology CT Renal Stone Study  Result Date: 12/15/2021 CLINICAL DATA:  Flank pain, kidney stone suspected EXAM: CT ABDOMEN AND PELVIS WITHOUT CONTRAST TECHNIQUE: Multidetector CT imaging of the abdomen and pelvis was performed following the standard protocol without IV contrast. RADIATION DOSE REDUCTION: This exam was performed according to the departmental dose-optimization program which includes automated exposure control, adjustment of the mA and/or kV according to patient size and/or use of iterative reconstruction technique. COMPARISON:  CT 07/09/2021 FINDINGS: Lower chest: No basilar consolidation or pleural effusion. Hepatobiliary: Mild diffuse hepatic steatosis. No evidence of focal liver lesion on this unenhanced exam Clips in the gallbladder fossa postcholecystectomy. No biliary dilatation. Pancreas: No ductal dilatation or inflammation. Spleen: Normal in size without focal abnormality. Adrenals/Urinary Tract: Normal adrenal glands. No hydronephrosis, renal calculi, or perinephric edema. Both ureters are decompressed. Urinary bladder is partially distended, no wall thickening or stone. Stomach/Bowel: Unremarkable appearance of the stomach. Occasional fluid throughout nondilated small bowel, no obstruction or inflammation. Normal appendix. Chronic submucosal fatty infiltration of the ascending colon. Remainder of the colon is decompressed  limiting detailed assessment. There is no pericolonic edema. Vascular/Lymphatic: Normal caliber abdominal aorta.  No adenopathy. Reproductive: Uterus and bilateral adnexa are unremarkable. Previous right ovarian cyst is no longer seen. Other: No free air, free fluid, or intra-abdominal fluid collection. Musculoskeletal: There are no acute or suspicious osseous abnormalities. IMPRESSION: 1. No renal stones or obstructive uropathy.  No acute findings. 2. Mild hepatic steatosis. Electronically Signed   By: Narda Rutherford M.D.   On: 12/15/2021 23:40    Procedures Procedures   Medications Ordered in ED Medications  sodium chloride 0.9 % bolus 1,000 mL ( Intravenous Stopped 12/16/21 0925)  haloperidol lactate (HALDOL) injection 2 mg (2 mg Intravenous Given 12/16/21 0933)  droperidol (INAPSINE) 2.5 MG/ML injection 1.25 mg (1.25 mg Intravenous Given 12/16/21 1026)  LORazepam (ATIVAN) injection 1 mg (1 mg Intravenous Given 12/16/21 1125)  sodium chloride 0.9 % bolus 500 mL (500 mLs Intravenous New Bag/Given 12/16/21 1139)    ED Course/ Medical Decision Making/ A&P  Medical Decision Making Amount and/or Complexity of Data Reviewed Labs: ordered.  Risk Prescription drug management.   26 year old female presents to ED for evaluation.  Please see HPI for further details.  On examination, the patient is afebrile and nontachycardic.  The patient lung sounds are clear bilaterally, she is not hypoxic.  The patient abdomen is soft and compressible throughout, there is no tenderness elicited.  The patient is nontoxic in appearance.  This patient was seen in this ED yesterday for the same complaint.  The patient had labs drawn to include CBC, CMP, lipase, hCG.  We will proceed with CBC, BMP today.  Patient had CT abdomen pelvis with contrast yesterday which did not show any intra-abdominal pathology which could be causing her nausea and vomiting.  Due to this, we will not repeat imaging.   The patient reports that today symptoms are continuation of the symptoms that brought her in yesterday.  Patient will be treated with 1 L normal saline, 2 mg Haldol for cannabinoid hyperemesis syndrome.  Update: Patient still complaining of nausea and vomiting.  Patient given 1.25 mg droperidol.  Update: Patient still complaining of nausea.  Patient given 1 mg Ativan.  Patient also given 500 mL bag of fluid.  Update: After Ativan was given, the patient states that her nausea has subsided.  The patient is requesting discharge home.  Patient lab work results are as follows: - BMP with decreased potassium to 3.2.  Advised the patient that I wish to replete her potassium here however she was requesting discharge at this time.  I have discharged the patient with 40 mEq potassium to take orally as an outpatient. - CBC with leukocytosis to 18.1 however this is consistent with the patient leukocytosis yesterday.  I suspect this is secondary to a stress reaction.  The patient is afebrile, nontachycardic.  Patient was given return precautions and she voiced understanding.  The patient had all of her questions answered her satisfaction prior to discharge home.  I encouraged the patient to stop using marijuana as it seems to not be agreeing with her and is causing her to have to present to the ED for management.  The patient voiced understanding with my advice.  I advised the patient that I would like to replete her potassium here in the department however she was wishing to be discharged home.  I advised the patient that I would prescribe her 40 mEq potassium to take orally as an outpatient, she voiced understanding of these instructions.  Patient states she still has Phenergan that she was prescribed yesterday at home.  The patient is stable for discharge at this time.  Final Clinical Impression(s) / ED Diagnoses Final diagnoses:  Cannabinoid hyperemesis syndrome    Rx / DC Orders ED Discharge Orders           Ordered    potassium chloride SA (KLOR-CON M) 20 MEQ tablet  2 times daily        12/16/21 1225                 Clent Ridges 12/16/21 1229    Jacalyn Lefevre, MD 12/16/21 1246

## 2021-12-16 NOTE — ED Triage Notes (Signed)
C/o N/V, seen here yesterday for same. Abdominal pain. Smoked marijuana today and said it made it worse.

## 2021-12-17 ENCOUNTER — Other Ambulatory Visit: Payer: Self-pay

## 2021-12-17 ENCOUNTER — Encounter (HOSPITAL_BASED_OUTPATIENT_CLINIC_OR_DEPARTMENT_OTHER): Payer: Self-pay | Admitting: Urology

## 2021-12-17 ENCOUNTER — Emergency Department (HOSPITAL_BASED_OUTPATIENT_CLINIC_OR_DEPARTMENT_OTHER)
Admission: EM | Admit: 2021-12-17 | Discharge: 2021-12-17 | Disposition: A | Discharge status: Home / Self Care | Payer: Medicaid Other | Attending: Emergency Medicine | Admitting: Emergency Medicine

## 2021-12-17 DIAGNOSIS — R109 Unspecified abdominal pain: Secondary | ICD-10-CM | POA: Insufficient documentation

## 2021-12-17 DIAGNOSIS — Z5321 Procedure and treatment not carried out due to patient leaving prior to being seen by health care provider: Secondary | ICD-10-CM | POA: Diagnosis not present

## 2021-12-17 DIAGNOSIS — R112 Nausea with vomiting, unspecified: Secondary | ICD-10-CM | POA: Insufficient documentation

## 2021-12-17 NOTE — ED Triage Notes (Signed)
Pt state continued N/V since yesterday, states continued abdominal pain as well  Denies fever  Last smoked THC this am

## 2021-12-17 NOTE — ED Notes (Signed)
Per registration, pt left.  No reason given

## 2021-12-25 ENCOUNTER — Other Ambulatory Visit (HOSPITAL_BASED_OUTPATIENT_CLINIC_OR_DEPARTMENT_OTHER): Payer: Self-pay

## 2021-12-25 ENCOUNTER — Telehealth: Payer: Self-pay | Admitting: Family Medicine

## 2021-12-25 NOTE — Telephone Encounter (Signed)
Patient calling to advise that her anxiety is really high to the point she cannot control it and would like a refill on her zoloft. Advised patient she may need to come in for a follow up .Please advise if patient needs to schedule appointment before medication can be sent.

## 2021-12-25 NOTE — Telephone Encounter (Signed)
PCP stated if taking already Zoloft to refill and then schedule appt on Monday.  Called the patient and she stated has been a year since she has had zoloft. Scheduled appt for 12/29/21.

## 2021-12-29 ENCOUNTER — Ambulatory Visit: Payer: Medicaid Other | Admitting: Family Medicine

## 2021-12-29 ENCOUNTER — Encounter: Payer: Self-pay | Admitting: Family Medicine

## 2021-12-29 ENCOUNTER — Other Ambulatory Visit (HOSPITAL_BASED_OUTPATIENT_CLINIC_OR_DEPARTMENT_OTHER): Payer: Self-pay

## 2021-12-29 DIAGNOSIS — Z720 Tobacco use: Secondary | ICD-10-CM

## 2021-12-29 DIAGNOSIS — F411 Generalized anxiety disorder: Secondary | ICD-10-CM | POA: Diagnosis not present

## 2021-12-29 DIAGNOSIS — F320 Major depressive disorder, single episode, mild: Secondary | ICD-10-CM | POA: Diagnosis not present

## 2021-12-29 MED ORDER — NICOTINE 7 MG/24HR TD PT24
7.0000 mg | MEDICATED_PATCH | Freq: Every day | TRANSDERMAL | 0 refills | Status: AC
  Filled 2021-12-29: qty 14, 14d supply, fill #0

## 2021-12-29 MED ORDER — ESCITALOPRAM OXALATE 10 MG PO TABS
10.0000 mg | ORAL_TABLET | Freq: Every day | ORAL | 2 refills | Status: DC
  Filled 2021-12-29: qty 30, 37d supply, fill #0
  Filled 2022-02-01: qty 30, 30d supply, fill #1
  Filled 2022-03-07: qty 30, 37d supply, fill #1

## 2021-12-29 MED ORDER — NICOTINE 14 MG/24HR TD PT24
14.0000 mg | MEDICATED_PATCH | Freq: Every day | TRANSDERMAL | 0 refills | Status: AC
  Filled 2021-12-29: qty 14, 14d supply, fill #0

## 2021-12-29 MED ORDER — NICOTINE 21 MG/24HR TD PT24
21.0000 mg | MEDICATED_PATCH | Freq: Every day | TRANSDERMAL | 0 refills | Status: AC
  Filled 2021-12-29: qty 42, 42d supply, fill #0
  Filled 2021-12-29: qty 28, 28d supply, fill #0

## 2021-12-29 NOTE — Patient Instructions (Signed)
Please consider counseling. Contact [phone] to schedule an appointment or inquire about cost/insurance coverage.  [facility] located at [address], [place], [place].  Phone number = [phone].  Dr. Onoriode Edeh - Adult Psychiatry.    [facility] located at [address], [place], [place]. Phone number = [phone].   [facility] located at [address], [place], [place].  Phone number = [phone].   [facility] located at [address], [place], [place].  Phone number = [phone].  Aim to do some physical exertion for 150 minutes per week. This is typically divided into 5 days per week, 30 minutes per day. The activity should be enough to get your heart rate up. Anything is better than nothing if you have time constraints.  Let us know if you need anything.  

## 2021-12-29 NOTE — Progress Notes (Signed)
CC: Anxiety/depression  Subjective Christine Trujillo presents for f/u anxiety/depression.  Pt is currently being treated with nothing.  Things have been worsening over past few mo, would like to go back on something. Zoloft helped but made her drowsy.  No thoughts of harming self or others. No self-medication with alcohol, prescription drugs. Does smoke marijuana daily.  Pt is not following with a counselor/psychologist. With increased stress, started smoking again. Smoking 2 ppd. Interested in starting nicotine patches.   Past Medical History:  Diagnosis Date   Anxiety    Cannabinoid hyperemesis syndrome    Depression    GERD (gastroesophageal reflux disease)    History of anemia    during pregnancy   History of gestational diabetes    Ovarian cyst    left   Pelvic pain    Upper respiratory symptom    symptoms started 02-16-2021 cough/ runny nose;  pcp visit 03-10-2021 with sore throat/ congestion/ sob/ body aches/ cough but no fever, negative covid/ flu/ strep resultsin epic  (03-13-2021  pt stated today only has nonproductive cough)   Allergies as of 12/29/2021   No Known Allergies      Medication List        Accurate as of December 29, 2021 12:23 PM. If you have any questions, ask your nurse or doctor.          STOP taking these medications    naproxen 500 MG tablet Commonly known as: NAPROSYN Stopped by: Sharlene Dory, DO   ondansetron 4 MG disintegrating tablet Commonly known as: ZOFRAN-ODT Stopped by: Sharlene Dory, DO       TAKE these medications    escitalopram 10 MG tablet Commonly known as: Lexapro Take 1/2 tablet by mouth daily for 2 weeks, then take 1 tablet (10 mg total) by mouth daily. Started by: Sharlene Dory, DO   HAIR/SKIN/NAILS PO Take 1 tablet by mouth daily.   multivitamin with minerals tablet Take 1 tablet by mouth daily.   nicotine 21 mg/24hr patch Commonly known as: NICODERM CQ - dosed in mg/24  hours Place 1 patch (21 mg total) onto the skin daily. Started by: Sharlene Dory, DO   nicotine 14 mg/24hr patch Commonly known as: NICODERM CQ - dosed in mg/24 hours Place 1 patch (14 mg total) onto the skin daily for 14 days. Started by: Sharlene Dory, DO   nicotine 7 mg/24hr patch Commonly known as: NICODERM CQ - dosed in mg/24 hr Place 1 patch (7 mg total) onto the skin daily for 14 days. Start taking on: February 23, 2022 Started by: Sharlene Dory, DO   potassium chloride SA 20 MEQ tablet Commonly known as: KLOR-CON M Take 2 tablets (40 mEq total) by mouth 2 (two) times daily.   promethazine 25 MG tablet Commonly known as: PHENERGAN Take 1 tablet (25 mg total) by mouth every 6 (six) hours as needed for up to 20 doses for nausea or vomiting.        Exam General:  well developed, well nourished, in no apparent distress Lungs:  No respiratory distress Psych: well oriented with normal range of affect and age-appropriate judgement/insight, alert and oriented x4.  Assessment and Plan  GAD (generalized anxiety disorder)  Depression, major, single episode, mild (HCC)  Tobacco abuse - Plan: nicotine (NICODERM CQ - DOSED IN MG/24 HR) 7 mg/24hr patch, nicotine (NICODERM CQ - DOSED IN MG/24 HOURS) 21 mg/24hr patch, nicotine (NICODERM CQ - DOSED IN MG/24 HOURS) 14 mg/24hr patch  1/2. Chronic, uncontrolled. Start Lexapro 5 mg/d for 2 weeks and increase to 10 mg/d. Counseling info provided. F/u in 1 mo. 3. Start 6 weeks of 21 mg/d patches, then 2 weeks of 14 mg/d patches, then 2 weeks of 7 mg/d patches. The patient voiced understanding and agreement to the plan.  [place], DO 12/29/21 12:23 PM

## 2021-12-30 ENCOUNTER — Other Ambulatory Visit (HOSPITAL_BASED_OUTPATIENT_CLINIC_OR_DEPARTMENT_OTHER): Payer: Self-pay

## 2022-01-15 ENCOUNTER — Other Ambulatory Visit (HOSPITAL_BASED_OUTPATIENT_CLINIC_OR_DEPARTMENT_OTHER): Payer: Self-pay

## 2022-01-29 ENCOUNTER — Ambulatory Visit: Payer: Medicaid Other | Admitting: Family Medicine

## 2022-02-01 ENCOUNTER — Other Ambulatory Visit (HOSPITAL_COMMUNITY): Payer: Self-pay

## 2022-02-02 ENCOUNTER — Other Ambulatory Visit (HOSPITAL_COMMUNITY): Payer: Self-pay

## 2022-02-03 ENCOUNTER — Other Ambulatory Visit (HOSPITAL_COMMUNITY): Payer: Self-pay

## 2022-02-03 ENCOUNTER — Encounter (HOSPITAL_COMMUNITY): Payer: Self-pay | Admitting: Pharmacist

## 2022-02-19 ENCOUNTER — Other Ambulatory Visit (HOSPITAL_COMMUNITY): Payer: Self-pay

## 2022-02-26 ENCOUNTER — Ambulatory Visit: Payer: Medicaid Other

## 2022-02-26 ENCOUNTER — Other Ambulatory Visit (HOSPITAL_COMMUNITY): Payer: Self-pay

## 2022-03-01 ENCOUNTER — Other Ambulatory Visit (HOSPITAL_COMMUNITY): Payer: Self-pay

## 2022-03-02 ENCOUNTER — Ambulatory Visit (INDEPENDENT_AMBULATORY_CARE_PROVIDER_SITE_OTHER): Payer: Medicaid Other

## 2022-03-02 DIAGNOSIS — Z3042 Encounter for surveillance of injectable contraceptive: Secondary | ICD-10-CM | POA: Diagnosis not present

## 2022-03-02 MED ORDER — MEDROXYPROGESTERONE ACETATE 150 MG/ML IM SUSP
150.0000 mg | Freq: Once | INTRAMUSCULAR | Status: AC
  Administered 2022-03-02: 150 mg via INTRAMUSCULAR

## 2022-03-02 NOTE — Progress Notes (Signed)
Pt here today for Depo Provera injection per Dr. Carmelia Roller.   Date last pap: 02/02/2021 . Last Depo-Provera: 12/08/21 Side Effects if any: None. Serum HCG indicated? No. Depo-Provera 150 mg administered IM L deltoid  Next appointment due Feb 6-March 6. Nurse visit scheduled 05/20/22 at 10 am.

## 2022-03-07 ENCOUNTER — Other Ambulatory Visit (HOSPITAL_BASED_OUTPATIENT_CLINIC_OR_DEPARTMENT_OTHER): Payer: Self-pay

## 2022-03-08 ENCOUNTER — Telehealth: Payer: Self-pay | Admitting: Family Medicine

## 2022-03-08 ENCOUNTER — Other Ambulatory Visit (HOSPITAL_BASED_OUTPATIENT_CLINIC_OR_DEPARTMENT_OTHER): Payer: Self-pay

## 2022-03-08 ENCOUNTER — Encounter: Payer: Medicaid Other | Admitting: Family Medicine

## 2022-03-08 NOTE — Telephone Encounter (Signed)
Pt called stating that she would like to have a TB test done for work. Pt does not have a preference of skin or blood test. Please Advise.

## 2022-03-08 NOTE — Telephone Encounter (Signed)
Called left detailed message when to schedule Nurse Visit appointment.

## 2022-03-09 NOTE — Telephone Encounter (Signed)
Called the patient and she is not going to take the job and does not need the TB test done at this time.

## 2022-03-16 ENCOUNTER — Other Ambulatory Visit (HOSPITAL_BASED_OUTPATIENT_CLINIC_OR_DEPARTMENT_OTHER): Payer: Self-pay

## 2022-03-30 ENCOUNTER — Encounter (HOSPITAL_BASED_OUTPATIENT_CLINIC_OR_DEPARTMENT_OTHER): Payer: Self-pay | Admitting: Emergency Medicine

## 2022-03-30 ENCOUNTER — Emergency Department (HOSPITAL_BASED_OUTPATIENT_CLINIC_OR_DEPARTMENT_OTHER)
Admission: EM | Admit: 2022-03-30 | Discharge: 2022-03-30 | Disposition: A | Discharge status: Home / Self Care | Payer: Medicaid Other | Attending: Emergency Medicine | Admitting: Emergency Medicine

## 2022-03-30 ENCOUNTER — Other Ambulatory Visit: Payer: Self-pay

## 2022-03-30 DIAGNOSIS — F12188 Cannabis abuse with other cannabis-induced disorder: Secondary | ICD-10-CM | POA: Diagnosis not present

## 2022-03-30 DIAGNOSIS — F17210 Nicotine dependence, cigarettes, uncomplicated: Secondary | ICD-10-CM | POA: Insufficient documentation

## 2022-03-30 DIAGNOSIS — D72829 Elevated white blood cell count, unspecified: Secondary | ICD-10-CM | POA: Diagnosis not present

## 2022-03-30 DIAGNOSIS — R112 Nausea with vomiting, unspecified: Secondary | ICD-10-CM

## 2022-03-30 LAB — HCG, SERUM, QUALITATIVE: Preg, Serum: NEGATIVE

## 2022-03-30 LAB — COMPREHENSIVE METABOLIC PANEL
ALT: 19 U/L (ref 0–44)
AST: 27 U/L (ref 15–41)
Albumin: 4.3 g/dL (ref 3.5–5.0)
Alkaline Phosphatase: 74 U/L (ref 38–126)
Anion gap: 10 (ref 5–15)
BUN: 9 mg/dL (ref 6–20)
CO2: 22 mmol/L (ref 22–32)
Calcium: 9.4 mg/dL (ref 8.9–10.3)
Chloride: 104 mmol/L (ref 98–111)
Creatinine, Ser: 0.76 mg/dL (ref 0.44–1.00)
GFR, Estimated: >60 mL/min (ref >60)
Glucose, Bld: 141 mg/dL — ABNORMAL HIGH (ref 70–99)
Potassium: 3.4 mmol/L — ABNORMAL LOW (ref 3.5–5.1)
Sodium: 136 mmol/L (ref 135–145)
Total Bilirubin: 1 mg/dL (ref 0.3–1.2)
Total Protein: 8 g/dL (ref 6.5–8.1)

## 2022-03-30 LAB — CBC
HCT: 39.6 % (ref 36.0–46.0)
Hemoglobin: 13.3 g/dL (ref 12.0–15.0)
MCH: 27.3 pg (ref 26.0–34.0)
MCHC: 33.6 g/dL (ref 30.0–36.0)
MCV: 81.1 fL (ref 80.0–100.0)
Platelets: 340 10*3/uL (ref 150–400)
RBC: 4.88 MIL/uL (ref 3.87–5.11)
RDW: 13.7 % (ref 11.5–15.5)
WBC: 17 10*3/uL — ABNORMAL HIGH (ref 4.0–10.5)
nRBC: 0 % (ref 0.0–0.2)

## 2022-03-30 LAB — DIFFERENTIAL
Abs Immature Granulocytes: 0.06 10*3/uL (ref 0.00–0.07)
Basophils Absolute: 0 10*3/uL (ref 0.0–0.1)
Basophils Relative: 0 %
Eosinophils Absolute: 0 10*3/uL (ref 0.0–0.5)
Eosinophils Relative: 0 %
Immature Granulocytes: 0 %
Lymphocytes Relative: 11 %
Lymphs Abs: 1.8 10*3/uL (ref 0.7–4.0)
Monocytes Absolute: 1.4 10*3/uL — ABNORMAL HIGH (ref 0.1–1.0)
Monocytes Relative: 8 %
Neutro Abs: 13.5 10*3/uL — ABNORMAL HIGH (ref 1.7–7.7)
Neutrophils Relative %: 80 %

## 2022-03-30 LAB — LIPASE, BLOOD: Lipase: 25 U/L (ref 11–51)

## 2022-03-30 MED ORDER — METOCLOPRAMIDE HCL 5 MG/ML IJ SOLN: 10.0000 mg | Freq: Once | INTRAMUSCULAR | Status: DC | PRN

## 2022-03-30 MED ORDER — HALOPERIDOL LACTATE 5 MG/ML IJ SOLN
5.0000 mg | Freq: Once | INTRAMUSCULAR | Status: AC
  Administered 2022-03-30: 5 mg via INTRAVENOUS
  Filled 2022-03-30: qty 1

## 2022-03-30 MED ORDER — METOCLOPRAMIDE HCL 5 MG/ML IJ SOLN: 10.0000 mg | Freq: Once | INTRAMUSCULAR | Status: DC

## 2022-03-30 MED ORDER — SODIUM CHLORIDE 0.9 % IV BOLUS
2000.0000 mL | Freq: Once | INTRAVENOUS | Status: AC
  Administered 2022-03-30: 2000 mL via INTRAVENOUS

## 2022-03-30 MED ORDER — FENTANYL CITRATE PF 50 MCG/ML IJ SOSY: 50.0000 ug | PREFILLED_SYRINGE | Freq: Once | INTRAMUSCULAR | Status: DC

## 2022-03-30 MED ORDER — ONDANSETRON HCL 4 MG/2ML IJ SOLN: 4.0000 mg | Freq: Once | INTRAMUSCULAR | Status: DC

## 2022-03-30 NOTE — ED Provider Notes (Signed)
Pt given meds and will re-evaluate.  Concern for hyperemesis syndrome.  Patient had received Haldol.  Labs are normal.  It was reported by nursing that patient was requesting nausea medication.  However then she left.  I was not able to reevaluate the patient.   Gwyneth Sprout, MD 03/30/22 [phone]

## 2022-03-30 NOTE — ED Triage Notes (Signed)
Pt is c/o vomiting and diarrhea since yesterday around 2 am  Pt states she cannot keep anything down  Pt states she feels dehydrated

## 2022-03-30 NOTE — ED Provider Notes (Signed)
[facility] DEPT [facility] Provider Note: Georgena Spurling, MD, FACEP  CSN: [id number] MRN: [id number] ARRIVAL: 03/30/22 at [place]: [id number]   CHIEF COMPLAINT  Vomiting   HISTORY OF PRESENT ILLNESS  03/30/22 6:22 AM Christine Trujillo is a 26 y.o. female with a history of cannabis hyperemesis syndrome.  She has had nausea, vomiting and diarrhea since yesterday morning about 2 AM.  She has not been able to keep anything down.  She feels like she is dehydrated and her mouth is dry.  She has also had crampy abdominal pain which is diffuse.  She rates this as a 9 out of 10.   Past Medical History:  Diagnosis Date   Anxiety    Cannabinoid hyperemesis syndrome    Depression    GERD (gastroesophageal reflux disease)    History of anemia    during pregnancy   History of gestational diabetes    Ovarian cyst    left   Pelvic pain    Upper respiratory symptom    symptoms started 02-16-2021 cough/ runny nose;  pcp visit 03-10-2021 with sore throat/ congestion/ sob/ body aches/ cough but no fever, negative covid/ flu/ strep resultsin epic  (03-13-2021  pt stated today only has nonproductive cough)    Past Surgical History:  Procedure Laterality Date   BIOPSY  10/22/2020   Procedure: BIOPSY;  Surgeon: Lavena Bullion, DO;  Location: [facility];  Service: Gastroenterology;;   CHOLECYSTECTOMY N/A 12/02/2016   Procedure: LAPAROSCOPIC CHOLECYSTECTOMY WITH INTRAOPERATIVE CHOLANGIOGRAM;  Surgeon: Alphonsa Overall, MD;  Location: [facility];  Service: General;  Laterality: N/A;   COLONOSCOPY WITH PROPOFOL N/A 10/22/2020   Procedure: COLONOSCOPY WITH PROPOFOL;  Surgeon: Lavena Bullion, DO;  Location: [facility];  Service: Gastroenterology;  Laterality: N/A;   ESOPHAGOGASTRODUODENOSCOPY (EGD) WITH PROPOFOL N/A 10/22/2020   Procedure: ESOPHAGOGASTRODUODENOSCOPY (EGD) WITH PROPOFOL;  Surgeon: Lavena Bullion, DO;  Location: [facility];  Service: Gastroenterology;  Laterality: N/A;    ESOPHAGOGASTRODUODENOSCOPY (EGD) WITH PROPOFOL N/A 10/22/2020   Procedure: ESOPHAGOGASTRODUODENOSCOPY (EGD) WITH PROPOFOL;  Surgeon: Lavena Bullion, DO;  Location: [facility];  Service: Gastroenterology;  Laterality: N/A;   LAPAROSCOPIC OVARIAN CYSTECTOMY Left 03/17/2021   Procedure: LAPAROSCOPIC Resection of Adnexal Mass;  Surgeon: Lavonia Drafts, MD;  Location: [facility];  Service: Gynecology;  Laterality: Left;   LAPAROSCOPIC SALPINGO OOPHERECTOMY Bilateral 03/17/2021   Procedure: LAPAROSCOPIC BIALTERAL SALPINGECTOMY;  Surgeon: Lavonia Drafts, MD;  Location: [place] [facility];  Service: Gynecology;  Laterality: Bilateral;   TUBAL LIGATION     pt unsure of affected side (procedure on 03/17/21)   WISDOM TOOTH EXTRACTION      Family History  Problem Relation Age of Onset   Diabetes Mother    Heart disease Mother        CHF   Stomach cancer Brother    Colon cancer Neg Hx    Pancreatic cancer Neg Hx    Esophageal cancer Neg Hx    Liver disease Neg Hx     Social History   Tobacco Use   Smoking status: Some Days    Packs/day: 0.50    Years: 5.00    Total pack years: 2.50    Types: Cigarettes   Smokeless tobacco: Never  Vaping Use   Vaping Use: Never used  Substance Use Topics   Alcohol use: Never   Drug use: Yes    Frequency: 3.0 times per week    Types: Marijuana    Comment: per pt smokes daily; last  used today 07/09/21    Prior to Admission medications   Medication Sig Start Date End Date Taking? Authorizing Provider  Biotin w/ Vitamins C & E (HAIR/SKIN/NAILS PO) Take 1 tablet by mouth daily.    [provider]  escitalopram (LEXAPRO) 10 MG tablet Take 1/2 tablet by mouth daily for 2 weeks, then take 1 tablet (10 mg total) by mouth daily. 12/29/21   Sharlene Dory, DO  Multiple Vitamins-Minerals (MULTIVITAMIN WITH MINERALS) tablet Take 1 tablet by mouth daily.    [provider]  potassium chloride  SA (KLOR-CON M) 20 MEQ tablet Take 2 tablets (40 mEq total) by mouth 2 (two) times daily. 12/16/21   Al Decant, PA-C  promethazine (PHENERGAN) 25 MG tablet Take 1 tablet (25 mg total) by mouth every 6 (six) hours as needed for up to 20 doses for nausea or vomiting. 12/15/21   Virgina Norfolk, DO    Allergies Patient has no known allergies.   REVIEW OF SYSTEMS  Negative except as noted here or in the History of Present Illness.   PHYSICAL EXAMINATION  Initial Vital Signs Blood pressure 130/71, pulse (!) 59, temperature 97.8 F (36.6 C), temperature source Oral, resp. rate 20, height 5\' 3"  (1.6 m), weight 127 kg, SpO2 100 %.  Examination General: Well-developed, well-nourished female in no acute distress; appearance consistent with age of record HENT: normocephalic; atraumatic Eyes: Normal appearance Neck: supple Heart: regular rate and rhythm; bradycardia Lungs: clear to auscultation bilaterally Abdomen: soft; nondistended; mild diffuse tenderness; bowel sounds present Extremities: No deformity; full range of motion; pulses normal Neurologic: Awake, alert and oriented; motor function intact in all extremities and symmetric; no facial droop Skin: Warm and dry Psychiatric: Normal mood and affect   RESULTS  Summary of this visit's results, reviewed and interpreted by myself:   EKG Interpretation  Date/Time:    Ventricular Rate:    PR Interval:    QRS Duration:   QT Interval:    QTC Calculation:   R Axis:     Text Interpretation:         Laboratory Studies: Results for orders placed or performed during the hospital encounter of 03/30/22 (from the past 24 hour(s))  Lipase, blood     Status: None   Collection Time: 03/30/22  5:00 AM  Result Value Ref Range   Lipase 25 11 - 51 U/L  Comprehensive metabolic panel     Status: Abnormal   Collection Time: 03/30/22  5:00 AM  Result Value Ref Range   Sodium 136 135 - 145 mmol/L   Potassium 3.4 (L) 3.5 - 5.1 mmol/L    Chloride 104 98 - 111 mmol/L   CO2 22 22 - 32 mmol/L   Glucose, Bld 141 (H) 70 - 99 mg/dL   BUN 9 6 - 20 mg/dL   Creatinine, Ser 04/01/22 0.44 - 1.00 mg/dL   Calcium 9.4 8.9 - 2.99 mg/dL   Total Protein 8.0 6.5 - 8.1 g/dL   Albumin 4.3 3.5 - 5.0 g/dL   AST 27 15 - 41 U/L   ALT 19 0 - 44 U/L   Alkaline Phosphatase 74 38 - 126 U/L   Total Bilirubin 1.0 0.3 - 1.2 mg/dL   GFR, Estimated 37.1 >69 mL/min   Anion gap 10 5 - 15  CBC     Status: Abnormal   Collection Time: 03/30/22  5:00 AM  Result Value Ref Range   WBC 17.0 (H) 4.0 - 10.5 K/uL   RBC  4.88 3.87 - 5.11 MIL/uL   Hemoglobin 13.3 12.0 - 15.0 g/dL   HCT 39.6 36.0 - 46.0 %   MCV 81.1 80.0 - 100.0 fL   MCH 27.3 26.0 - 34.0 pg   MCHC 33.6 30.0 - 36.0 g/dL   RDW 13.7 11.5 - 15.5 %   Platelets 340 150 - 400 K/uL   nRBC 0.0 0.0 - 0.2 %  Differential     Status: Abnormal   Collection Time: 03/30/22  5:00 AM  Result Value Ref Range   Neutrophils Relative % 80 %   Neutro Abs 13.5 (H) 1.7 - 7.7 K/uL   Lymphocytes Relative 11 %   Lymphs Abs 1.8 0.7 - 4.0 K/uL   Monocytes Relative 8 %   Monocytes Absolute 1.4 (H) 0.1 - 1.0 K/uL   Eosinophils Relative 0 %   Eosinophils Absolute 0.0 0.0 - 0.5 K/uL   Basophils Relative 0 %   Basophils Absolute 0.0 0.0 - 0.1 K/uL   Immature Granulocytes 0 %   Abs Immature Granulocytes 0.06 0.00 - 0.07 K/uL  hCG, serum, qualitative     Status: None   Collection Time: 03/30/22  6:37 AM  Result Value Ref Range   Preg, Serum NEGATIVE NEGATIVE   Imaging Studies: No results found.  ED COURSE and MDM  Nursing notes, initial and subsequent vitals signs, including pulse oximetry, reviewed and interpreted by myself.  Vitals:   03/30/22 0451 03/30/22 0621  BP:  130/71  Pulse:  (!) 59  Resp:  20  Temp:  97.8 F (36.6 C)  TempSrc:  Oral  SpO2:  100%  Weight: 127 kg   Height: 5\' 3"  (1.6 m)    Medications  sodium chloride 0.9 % bolus 2,000 mL (0 mLs Intravenous Stopped 03/30/22 0742)  haloperidol  lactate (HALDOL) injection 5 mg (5 mg Intravenous Given 03/30/22 0633)   7:00 AM Signed out to Dr. Maryan Rued.  Patient given IV fluids and Haldol.  I suspect this is another exacerbation of her cannabinoid associated hyperemesis syndrome.  Laboratory studies are within normal limits except for leukocytosis but she has had a leukocytosis on previous presentations as well.   PROCEDURES  Procedures   ED DIAGNOSES     ICD-10-CM   1. Cannabinoid hyperemesis syndrome  R11.2    F12.90          Shanon Rosser, MD 03/30/22 2246

## 2022-03-30 NOTE — ED Notes (Signed)
Went into patients room to introduce myself and pt requested something to drink. Took drink back into patients room and pt asked if she could just leave because she isn't getting any rest here. Pt signed out AMA.

## 2022-04-14 ENCOUNTER — Other Ambulatory Visit (HOSPITAL_BASED_OUTPATIENT_CLINIC_OR_DEPARTMENT_OTHER): Payer: Self-pay

## 2022-04-14 ENCOUNTER — Other Ambulatory Visit: Payer: Self-pay | Admitting: Family Medicine

## 2022-04-14 MED ORDER — POTASSIUM CHLORIDE CRYS ER 20 MEQ PO TBCR
40.0000 meq | EXTENDED_RELEASE_TABLET | Freq: Two times a day (BID) | ORAL | 0 refills | Status: DC
  Filled 2022-04-14 – 2022-09-06 (×4): qty 2, 1d supply, fill #0
  Filled 2022-09-07 – 2022-10-01 (×2): qty 4, 1d supply, fill #0

## 2022-04-22 ENCOUNTER — Other Ambulatory Visit (HOSPITAL_BASED_OUTPATIENT_CLINIC_OR_DEPARTMENT_OTHER): Payer: Self-pay

## 2022-05-03 DIAGNOSIS — S8252XD Displaced fracture of medial malleolus of left tibia, subsequent encounter for closed fracture with routine healing: Secondary | ICD-10-CM | POA: Diagnosis not present

## 2022-05-03 DIAGNOSIS — S93432D Sprain of tibiofibular ligament of left ankle, subsequent encounter: Secondary | ICD-10-CM | POA: Diagnosis not present

## 2022-05-03 DIAGNOSIS — Z6841 Body Mass Index (BMI) 40.0 and over, adult: Secondary | ICD-10-CM | POA: Diagnosis not present

## 2022-05-20 ENCOUNTER — Ambulatory Visit (INDEPENDENT_AMBULATORY_CARE_PROVIDER_SITE_OTHER): Payer: Medicaid Other

## 2022-05-20 DIAGNOSIS — Z309 Encounter for contraceptive management, unspecified: Secondary | ICD-10-CM

## 2022-05-20 DIAGNOSIS — Z3042 Encounter for surveillance of injectable contraceptive: Secondary | ICD-10-CM

## 2022-05-20 MED ORDER — MEDROXYPROGESTERONE ACETATE 150 MG/ML IM SUSP
150.0000 mg | Freq: Once | INTRAMUSCULAR | Status: AC
  Administered 2022-05-20: 150 mg via INTRAMUSCULAR

## 2022-05-20 NOTE — Progress Notes (Signed)
Pt here today for Depo Provera injection per Dr. Nani Ravens.   Date last pap: 02/02/2021 . Last Depo-Provera: 03/02/2022 Side Effects if any: None. Serum HCG indicated? No. Depo-Provera 150 mg administered IM L deltoid  Next appointment due April 26th to May 10th.. Patient is due to see Dr. Nani Ravens for follow up. Appointent was scheduled for 4/26 ov and Depo provera.

## 2022-06-29 ENCOUNTER — Other Ambulatory Visit (HOSPITAL_BASED_OUTPATIENT_CLINIC_OR_DEPARTMENT_OTHER): Payer: Self-pay

## 2022-06-29 ENCOUNTER — Other Ambulatory Visit: Payer: Self-pay

## 2022-07-08 ENCOUNTER — Other Ambulatory Visit (HOSPITAL_BASED_OUTPATIENT_CLINIC_OR_DEPARTMENT_OTHER): Payer: Self-pay

## 2022-07-26 ENCOUNTER — Encounter: Payer: Self-pay | Admitting: *Deleted

## 2022-07-27 ENCOUNTER — Other Ambulatory Visit (HOSPITAL_BASED_OUTPATIENT_CLINIC_OR_DEPARTMENT_OTHER): Payer: Self-pay

## 2022-07-27 ENCOUNTER — Other Ambulatory Visit: Payer: Self-pay

## 2022-08-05 ENCOUNTER — Other Ambulatory Visit (HOSPITAL_BASED_OUTPATIENT_CLINIC_OR_DEPARTMENT_OTHER): Payer: Self-pay

## 2022-08-06 ENCOUNTER — Ambulatory Visit: Payer: Medicaid Other | Admitting: Family Medicine

## 2022-08-20 ENCOUNTER — Telehealth: Payer: Self-pay | Admitting: *Deleted

## 2022-08-20 NOTE — Telephone Encounter (Signed)
Pt wanted to schedule depo provera and is unable to come in window.  She will be out of window.  Pt has had a her fallopian tubes removed.  Per wendling needs an appt to discuss about continuing.    Robin called and scheduled office visit.

## 2022-08-23 ENCOUNTER — Other Ambulatory Visit (HOSPITAL_BASED_OUTPATIENT_CLINIC_OR_DEPARTMENT_OTHER): Payer: Self-pay

## 2022-08-23 ENCOUNTER — Ambulatory Visit: Payer: Medicaid Other | Admitting: Family Medicine

## 2022-08-23 ENCOUNTER — Encounter: Payer: Self-pay | Admitting: Family Medicine

## 2022-08-23 VITALS — BP 128/78 | HR 82 | Temp 98.4°F | Ht 63.0 in | Wt 257.4 lb

## 2022-08-23 DIAGNOSIS — Z6841 Body Mass Index (BMI) 40.0 and over, adult: Secondary | ICD-10-CM | POA: Diagnosis not present

## 2022-08-23 DIAGNOSIS — N939 Abnormal uterine and vaginal bleeding, unspecified: Secondary | ICD-10-CM | POA: Diagnosis not present

## 2022-08-23 DIAGNOSIS — Z0000 Encounter for general adult medical examination without abnormal findings: Secondary | ICD-10-CM | POA: Diagnosis not present

## 2022-08-23 MED ORDER — SERTRALINE HCL 50 MG PO TABS
50.0000 mg | ORAL_TABLET | Freq: Every day | ORAL | 3 refills | Status: DC
  Filled 2022-08-23 – 2022-10-01 (×4): qty 30, 30d supply, fill #0

## 2022-08-23 NOTE — Progress Notes (Signed)
Chief Complaint  Patient presents with   Medication Problem    Discuss Depo Change lexapro      Well Woman Christine Trujillo is here for a complete physical.   Her last physical was >1 year ago.  Current diet: in general, a "healthy" diet. Current exercise: none.  Recovering from an ankle fracture. Contraception? Yes; on Depo-provera shot transitioning Fatigue out of ordinary? No Seatbelt? Yes Advanced directive? No  Health Maintenance Pap/HPV- Yes Tetanus- Yes HIV screening- Yes Hep C screening- Yes  Patient has been on Depo-Provera for several years.  She took a 76-month break and started having heavy cycles.  This resolved when she went on the progesterone shot again.  She does not do well with oral contraceptives.  She has a history of anxiety/depression.  She was on Lexapro for a while and then stopped.  She has been off of any medication for the past 7 months and is interested in going back on Zoloft which she did the best with.  She is not following with a therapist.  She has been struggling with her weight.  Diet/exercise as above.  She is interested in bariatric surgery.  Past Medical History:  Diagnosis Date   Anxiety    Cannabinoid hyperemesis syndrome    Depression    GERD (gastroesophageal reflux disease)    History of anemia    during pregnancy   History of gestational diabetes    Ovarian cyst    left   Pelvic pain    Upper respiratory symptom    symptoms started 02-16-2021 cough/ runny nose;  pcp visit 03-10-2021 with sore throat/ congestion/ sob/ body aches/ cough but no fever, negative covid/ flu/ strep resultsin epic  (03-13-2021  pt stated today only has nonproductive cough)     Past Surgical History:  Procedure Laterality Date   BIOPSY  10/22/2020   Procedure: BIOPSY;  Surgeon: Shellia Cleverly, DO;  Location: [facility];  Service: Gastroenterology;;   CHOLECYSTECTOMY N/A 12/02/2016   Procedure: LAPAROSCOPIC CHOLECYSTECTOMY WITH  INTRAOPERATIVE CHOLANGIOGRAM;  Surgeon: Ovidio Kin, MD;  Location: [facility];  Service: General;  Laterality: N/A;   COLONOSCOPY WITH PROPOFOL N/A 10/22/2020   Procedure: COLONOSCOPY WITH PROPOFOL;  Surgeon: Shellia Cleverly, DO;  Location: [facility];  Service: Gastroenterology;  Laterality: N/A;   ESOPHAGOGASTRODUODENOSCOPY (EGD) WITH PROPOFOL N/A 10/22/2020   Procedure: ESOPHAGOGASTRODUODENOSCOPY (EGD) WITH PROPOFOL;  Surgeon: Shellia Cleverly, DO;  Location: [facility];  Service: Gastroenterology;  Laterality: N/A;   ESOPHAGOGASTRODUODENOSCOPY (EGD) WITH PROPOFOL N/A 10/22/2020   Procedure: ESOPHAGOGASTRODUODENOSCOPY (EGD) WITH PROPOFOL;  Surgeon: Shellia Cleverly, DO;  Location: [facility];  Service: Gastroenterology;  Laterality: N/A;   LAPAROSCOPIC OVARIAN CYSTECTOMY Left 03/17/2021   Procedure: LAPAROSCOPIC Resection of Adnexal Mass;  Surgeon: Willodean Rosenthal, MD;  Location: [facility];  Service: Gynecology;  Laterality: Left;   LAPAROSCOPIC SALPINGO OOPHERECTOMY Bilateral 03/17/2021   Procedure: LAPAROSCOPIC BIALTERAL SALPINGECTOMY;  Surgeon: Willodean Rosenthal, MD;  Location: [facility];  Service: Gynecology;  Laterality: Bilateral;   TUBAL LIGATION     pt unsure of affected side (procedure on 03/17/21)   WISDOM TOOTH EXTRACTION      Medications  Current Outpatient Medications on File Prior to Visit  Medication Sig Dispense Refill   Biotin w/ Vitamins C & E (HAIR/SKIN/NAILS PO) Take 1 tablet by mouth daily.     Multiple Vitamins-Minerals (MULTIVITAMIN WITH MINERALS) tablet Take 1 tablet by mouth daily.     potassium chloride SA (KLOR-CON M)  20 MEQ tablet Take 2 tablets (40 mEq total) by mouth 2 (two) times daily. 2 tablet 0   promethazine (PHENERGAN) 25 MG tablet Take 1 tablet (25 mg total) by mouth every 6 (six) hours as needed for up to 20 doses for nausea or vomiting. 20 tablet 0    Allergies No Known Allergies  Review  of Systems: Constitutional:  no unexpected weight changes Eye:  no recent significant change in vision Ear/Nose/Mouth/Throat:  Ears:  no tinnitus or vertigo and no recent change in hearing Nose/Mouth/Throat:  no complaints of nasal congestion, no sore throat Cardiovascular: no chest pain Respiratory:  no cough and no shortness of breath Gastrointestinal:  no abdominal pain, no change in bowel habits GU:  Female: negative for dysuria or pelvic pain Musculoskeletal/Extremities:  no pain of the joints Integumentary (Skin/Breast):  no abnormal skin lesions reported Neurologic:  no headaches Endocrine:  denies fatigue Hematologic/Lymphatic:  No areas of easy bleeding  Exam BP 128/78 (BP Location: Left Arm, Patient Position: Sitting, Cuff Size: Large)   Pulse 82   Temp 98.4 F (36.9 C) (Oral)   Ht 5\' 3"  (1.6 m)   Wt 257 lb 6 oz (116.7 kg)   SpO2 99%   BMI 45.59 kg/m  General:  well developed, well nourished, in no apparent distress Skin:  no significant moles, warts, or growths Head:  no masses, lesions, or tenderness Eyes:  pupils equal and round, sclera anicteric without injection Ears:  canals without lesions, TMs shiny without retraction, no obvious effusion, no erythema Nose:  nares patent, mucosa normal, and no drainage  Throat/Pharynx:  lips and gingiva without lesion; tongue and uvula midline; non-inflamed pharynx; no exudates or postnasal drainage Neck: neck supple without adenopathy, thyromegaly, or masses Lungs:  clear to auscultation, breath sounds equal bilaterally, no respiratory distress Cardio:  regular rate and rhythm, no bruits, no LE edema Abdomen:  abdomen soft, nontender; bowel sounds normal; no masses or organomegaly Genital: Defer to GYN Musculoskeletal:  symmetrical muscle groups noted without atrophy or deformity Extremities:  no clubbing, cyanosis, or edema, no deformities, no skin discoloration Neuro:  gait normal; deep tendon reflexes normal and  symmetric Psych: well oriented with normal range of affect and appropriate judgment/insight  Assessment and Plan  Well adult exam - Plan: Comprehensive metabolic panel, CBC, Lipid panel  Abnormal uterine bleeding (AUB)  BMI 45.0-49.9, adult (HCC) - Plan: Amb Referral to Bariatric Surgery   Well 27 y.o. female. Counseled on diet and exercise. Advanced directive form provided today.  Other orders as above.  She will come back for labs when she is fasting. I do not want her on the Depo injection anymore due to decreased bone density and the duration she has been on it.  We did discuss contraceptive options and she did to have I did not on the Nexplanon.  Will have her come back in the next month to get blood and insert this. Restarting Zoloft as she does not like how Lexapro made her feel. Morbid obesity: Refer to bariatric surgery. Follow up in 1 mo to get blood work, insert the Nexplanon, and see how she is doing on the Zoloft. The patient voiced understanding and agreement to the plan.  [name] Bland, DO 08/23/22 4:26 PM

## 2022-08-23 NOTE — Patient Instructions (Addendum)
Keep the diet clean and stay active.  If you do not hear anything about your referral in the next 1-2 weeks, call our office and ask for an update.  Let us know if you need anything.  

## 2022-09-03 ENCOUNTER — Other Ambulatory Visit (HOSPITAL_BASED_OUTPATIENT_CLINIC_OR_DEPARTMENT_OTHER): Payer: Self-pay

## 2022-09-06 ENCOUNTER — Other Ambulatory Visit: Payer: Self-pay

## 2022-09-07 ENCOUNTER — Other Ambulatory Visit: Payer: Self-pay

## 2022-09-07 ENCOUNTER — Other Ambulatory Visit (HOSPITAL_BASED_OUTPATIENT_CLINIC_OR_DEPARTMENT_OTHER): Payer: Self-pay

## 2022-09-07 ENCOUNTER — Other Ambulatory Visit (HOSPITAL_COMMUNITY): Payer: Self-pay

## 2022-09-07 ENCOUNTER — Encounter (HOSPITAL_COMMUNITY): Payer: Self-pay

## 2022-09-10 ENCOUNTER — Other Ambulatory Visit: Payer: Self-pay

## 2022-09-13 ENCOUNTER — Other Ambulatory Visit (HOSPITAL_BASED_OUTPATIENT_CLINIC_OR_DEPARTMENT_OTHER): Payer: Self-pay

## 2022-09-17 ENCOUNTER — Other Ambulatory Visit (HOSPITAL_BASED_OUTPATIENT_CLINIC_OR_DEPARTMENT_OTHER): Payer: Self-pay

## 2022-10-01 ENCOUNTER — Other Ambulatory Visit (HOSPITAL_BASED_OUTPATIENT_CLINIC_OR_DEPARTMENT_OTHER): Payer: Self-pay

## 2022-10-11 ENCOUNTER — Other Ambulatory Visit (INDEPENDENT_AMBULATORY_CARE_PROVIDER_SITE_OTHER): Payer: MEDICAID

## 2022-10-11 DIAGNOSIS — Z0000 Encounter for general adult medical examination without abnormal findings: Secondary | ICD-10-CM

## 2022-10-11 LAB — COMPREHENSIVE METABOLIC PANEL
ALT: 12 U/L (ref 0–35)
AST: 12 U/L (ref 0–37)
Albumin: 4.2 g/dL (ref 3.5–5.2)
Alkaline Phosphatase: 81 U/L (ref 39–117)
BUN: 8 mg/dL (ref 6–23)
CO2: 23 mEq/L (ref 19–32)
Calcium: 9.3 mg/dL (ref 8.4–10.5)
Chloride: 105 mEq/L (ref 96–112)
Creatinine, Ser: 0.9 mg/dL (ref 0.40–1.20)
GFR: 87.31 mL/min (ref >60.00)
Glucose, Bld: 86 mg/dL (ref 70–99)
Potassium: 3.6 mEq/L (ref 3.5–5.1)
Sodium: 138 mEq/L (ref 135–145)
Total Bilirubin: 0.6 mg/dL (ref 0.2–1.2)
Total Protein: 7 g/dL (ref 6.0–8.3)

## 2022-10-11 LAB — LIPID PANEL
Cholesterol: 186 mg/dL (ref 0–200)
HDL: 38 mg/dL — ABNORMAL LOW (ref >39.00)
LDL Cholesterol: 136 mg/dL — ABNORMAL HIGH (ref 0–99)
NonHDL: 148.04
Total CHOL/HDL Ratio: 5
Triglycerides: 59 mg/dL (ref 0.0–149.0)
VLDL: 11.8 mg/dL (ref 0.0–40.0)

## 2022-10-11 LAB — CBC
HCT: 40 % (ref 36.0–46.0)
Hemoglobin: 13.3 g/dL (ref 12.0–15.0)
MCHC: 33.1 g/dL (ref 30.0–36.0)
MCV: 84.5 fl (ref 78.0–100.0)
Platelets: 338 10*3/uL (ref 150.0–400.0)
RBC: 4.73 Mil/uL (ref 3.87–5.11)
RDW: 13.7 % (ref 11.5–15.5)
WBC: 10.8 10*3/uL — ABNORMAL HIGH (ref 4.0–10.5)

## 2022-10-12 ENCOUNTER — Ambulatory Visit: Payer: MEDICAID | Admitting: Family Medicine

## 2022-10-15 ENCOUNTER — Other Ambulatory Visit (HOSPITAL_BASED_OUTPATIENT_CLINIC_OR_DEPARTMENT_OTHER): Payer: Self-pay

## 2022-11-02 ENCOUNTER — Ambulatory Visit: Payer: MEDICAID

## 2022-11-04 ENCOUNTER — Telehealth: Payer: Self-pay

## 2022-11-04 ENCOUNTER — Ambulatory Visit: Payer: MEDICAID

## 2022-11-04 NOTE — Telephone Encounter (Signed)
Pt scheduled today for depo provera. Chart reviewed. Per last OV w/ Dr. Carmelia Roller on 08/23/22, he didn't want her depo any longer, she was to follow-up in 1 month for insertion of nexplanon and sertraline follow-up.   Called Pt, no answer, and voicemail was full. However, she did return my call, informed her of above, I offered to schedule her an appt for when Dr. Carmelia Roller returns to office next week. Pt states she would call back to schedule. Nurse visit cancelled.

## 2022-11-05 ENCOUNTER — Ambulatory Visit: Payer: MEDICAID | Admitting: Obstetrics & Gynecology

## 2022-11-12 ENCOUNTER — Other Ambulatory Visit: Payer: Self-pay

## 2022-11-12 ENCOUNTER — Other Ambulatory Visit (HOSPITAL_BASED_OUTPATIENT_CLINIC_OR_DEPARTMENT_OTHER): Payer: Self-pay

## 2022-11-12 ENCOUNTER — Encounter (HOSPITAL_BASED_OUTPATIENT_CLINIC_OR_DEPARTMENT_OTHER): Payer: Self-pay

## 2022-11-12 ENCOUNTER — Emergency Department (HOSPITAL_BASED_OUTPATIENT_CLINIC_OR_DEPARTMENT_OTHER)
Admission: EM | Admit: 2022-11-12 | Discharge: 2022-11-12 | Disposition: A | Discharge status: Home / Self Care | Payer: MEDICAID | Attending: Emergency Medicine | Admitting: Emergency Medicine

## 2022-11-12 DIAGNOSIS — F1290 Cannabis use, unspecified, uncomplicated: Secondary | ICD-10-CM | POA: Insufficient documentation

## 2022-11-12 DIAGNOSIS — E876 Hypokalemia: Secondary | ICD-10-CM | POA: Diagnosis not present

## 2022-11-12 DIAGNOSIS — R112 Nausea with vomiting, unspecified: Secondary | ICD-10-CM | POA: Insufficient documentation

## 2022-11-12 LAB — URINALYSIS, ROUTINE W REFLEX MICROSCOPIC
Bilirubin Urine: NEGATIVE
Glucose, UA: NEGATIVE mg/dL
Ketones, ur: >=80 mg/dL — AB
Leukocytes,Ua: NEGATIVE
Nitrite: NEGATIVE
Protein, ur: NEGATIVE mg/dL
Specific Gravity, Urine: 1.025 (ref 1.005–1.030)
pH: 7 (ref 5.0–8.0)

## 2022-11-12 LAB — COMPREHENSIVE METABOLIC PANEL
ALT: 21 U/L (ref 0–44)
AST: 22 U/L (ref 15–41)
Albumin: 4 g/dL (ref 3.5–5.0)
Alkaline Phosphatase: 68 U/L (ref 38–126)
Anion gap: 13 (ref 5–15)
BUN: 8 mg/dL (ref 6–20)
CO2: 17 mmol/L — ABNORMAL LOW (ref 22–32)
Calcium: 8.9 mg/dL (ref 8.9–10.3)
Chloride: 100 mmol/L (ref 98–111)
Creatinine, Ser: 0.89 mg/dL (ref 0.44–1.00)
GFR, Estimated: >60 mL/min (ref >60)
Glucose, Bld: 127 mg/dL — ABNORMAL HIGH (ref 70–99)
Potassium: 3 mmol/L — ABNORMAL LOW (ref 3.5–5.1)
Sodium: 130 mmol/L — ABNORMAL LOW (ref 135–145)
Total Bilirubin: 1.1 mg/dL (ref 0.3–1.2)
Total Protein: 7.3 g/dL (ref 6.5–8.1)

## 2022-11-12 LAB — CBC WITH DIFFERENTIAL/PLATELET
Abs Immature Granulocytes: 0.06 10*3/uL (ref 0.00–0.07)
Basophils Absolute: 0 10*3/uL (ref 0.0–0.1)
Basophils Relative: 0 %
Eosinophils Absolute: 0 10*3/uL (ref 0.0–0.5)
Eosinophils Relative: 0 %
HCT: 37.9 % (ref 36.0–46.0)
Hemoglobin: 12.8 g/dL (ref 12.0–15.0)
Immature Granulocytes: 0 %
Lymphocytes Relative: 9 %
Lymphs Abs: 1.3 10*3/uL (ref 0.7–4.0)
MCH: 27.9 pg (ref 26.0–34.0)
MCHC: 33.8 g/dL (ref 30.0–36.0)
MCV: 82.8 fL (ref 80.0–100.0)
Monocytes Absolute: 0.5 10*3/uL (ref 0.1–1.0)
Monocytes Relative: 3 %
Neutro Abs: 13 10*3/uL — ABNORMAL HIGH (ref 1.7–7.7)
Neutrophils Relative %: 88 %
Platelets: 298 10*3/uL (ref 150–400)
RBC: 4.58 MIL/uL (ref 3.87–5.11)
RDW: 13.6 % (ref 11.5–15.5)
WBC: 14.9 10*3/uL — ABNORMAL HIGH (ref 4.0–10.5)
nRBC: 0 % (ref 0.0–0.2)

## 2022-11-12 LAB — URINALYSIS, MICROSCOPIC (REFLEX)

## 2022-11-12 LAB — RAPID URINE DRUG SCREEN, HOSP PERFORMED
Amphetamines: NOT DETECTED
Barbiturates: NOT DETECTED
Benzodiazepines: NOT DETECTED
Cocaine: NOT DETECTED
Opiates: NOT DETECTED
Tetrahydrocannabinol: POSITIVE — AB

## 2022-11-12 LAB — HCG, QUANTITATIVE, PREGNANCY: hCG, Beta Chain, Quant, S: <1 m[IU]/mL (ref <5)

## 2022-11-12 LAB — MAGNESIUM: Magnesium: 1.4 mg/dL — ABNORMAL LOW (ref 1.7–2.4)

## 2022-11-12 LAB — LIPASE, BLOOD: Lipase: 22 U/L (ref 11–51)

## 2022-11-12 MED ORDER — ONDANSETRON 4 MG PO TBDP
4.0000 mg | ORAL_TABLET | Freq: Three times a day (TID) | ORAL | 0 refills | Status: DC | PRN
  Filled 2022-11-12: qty 20, 7d supply, fill #0

## 2022-11-12 MED ORDER — MAGNESIUM OXIDE -MG SUPPLEMENT 400 (240 MG) MG PO TABS
400.0000 mg | ORAL_TABLET | Freq: Once | ORAL | Status: AC
  Administered 2022-11-12: 400 mg via ORAL
  Filled 2022-11-12: qty 1

## 2022-11-12 MED ORDER — PROMETHAZINE HCL 25 MG RE SUPP
25.0000 mg | Freq: Four times a day (QID) | RECTAL | 0 refills | Status: DC | PRN
  Filled 2022-11-12: qty 12, 3d supply, fill #0

## 2022-11-12 MED ORDER — LACTATED RINGERS IV BOLUS
1000.0000 mL | Freq: Once | INTRAVENOUS | Status: AC
  Administered 2022-11-12: 1000 mL via INTRAVENOUS

## 2022-11-12 MED ORDER — POTASSIUM CHLORIDE CRYS ER 20 MEQ PO TBCR
40.0000 meq | EXTENDED_RELEASE_TABLET | Freq: Once | ORAL | Status: AC
  Administered 2022-11-12: 40 meq via ORAL
  Filled 2022-11-12: qty 2

## 2022-11-12 MED ORDER — DROPERIDOL 2.5 MG/ML IJ SOLN
2.5000 mg | Freq: Once | INTRAMUSCULAR | Status: AC
  Administered 2022-11-12: 2.5 mg via INTRAVENOUS
  Filled 2022-11-12: qty 2

## 2022-11-12 NOTE — ED Triage Notes (Signed)
Patient has been V/D for 24 hours.

## 2022-11-12 NOTE — ED Notes (Signed)
Pt "just woke up, IV pump beeping". IVF restarted/infusing. Pt states, "ready to go".

## 2022-11-12 NOTE — ED Notes (Signed)
Discharge instruction reviewed and given. No questioned asked. Patient stated she understood instructions. Patient pain free and  ambulatory at discharge.

## 2022-11-12 NOTE — Discharge Instructions (Addendum)
As discussed, workup today overall reassuring.  Recommend continuing your potassium as well as your magnesium at home as both levels were low today while in the emergency department.  Will send in nausea medicine to use as needed.  Recommend bland diet over the next few days until able to tolerate more complex foods.  Recommend follow up with primary care for reassessment of your symptoms.  Please do not hesitate to return to emergency department if the worrisome signs and symptoms were discussed with the parent.

## 2022-11-12 NOTE — ED Provider Notes (Signed)
[place] [facility] AT [facility] Provider Note   CSN: [id number] Arrival date & time: 11/12/22  1511     History  Chief Complaint  Patient presents with   Emesis   Abdominal Pain    Christine Trujillo is a 27 y.o. female.   Emesis Associated symptoms: abdominal pain   Abdominal Pain Associated symptoms: vomiting     27 year old female presents emergency department plaints of nausea, vomiting, diarrhea.  Patient states that symptoms are present since awakening this morning.  States she has been seen in the past for similar symptoms and has been told related to her marijuana use.  States that she is continue to use marijuana with most recent use last night.  Denies any abdominal pain.  States that she had some cramping in her hands.  States has been unable to tolerate anything by mouth today.  Denies any fever, chills, cough, congestion, chest pain, shortness of breath, urinary symptoms, hematochezia/melena, hematemesis.  Past medical history significant for cannabinoid hyperemesis syndrome, GAD, depression, chronic low back pain  Home Medications Prior to Admission medications   Medication Sig Start Date End Date Taking? Authorizing Provider  ondansetron (ZOFRAN-ODT) 4 MG disintegrating tablet Take 1 tablet (4 mg total) by mouth every 8 (eight) hours as needed for nausea or vomiting. 11/12/22  Yes Sherian Maroon A, PA  promethazine (PHENERGAN) 25 MG suppository Place 1 suppository (25 mg total) rectally every 6 (six) hours as needed for nausea or vomiting. 11/12/22  Yes Sherian Maroon A, PA  Biotin w/ Vitamins C & E (HAIR/SKIN/NAILS PO) Take 1 tablet by mouth daily.    [provider]  Multiple Vitamins-Minerals (MULTIVITAMIN WITH MINERALS) tablet Take 1 tablet by mouth daily.    [provider]  potassium chloride SA (KLOR-CON M) 20 MEQ tablet Take 2 tablets (40 mEq total) by mouth 2 (two) times daily. 04/14/22   Sharlene Dory, DO   sertraline (ZOLOFT) 50 MG tablet Take 1 tablet (50 mg total) by mouth daily. 08/23/22   Sharlene Dory, DO      Allergies    Patient has no known allergies.    Review of Systems   Review of Systems  Gastrointestinal:  Positive for abdominal pain and vomiting.  All other systems reviewed and are negative.   Physical Exam Updated Vital Signs BP 124/68 (BP Location: Right Arm)   Pulse (!) 55   Temp 98.5 F (36.9 C) (Oral)   Resp 18   Ht 5' (1.524 m)   Wt 127 kg   SpO2 100%   BMI 54.68 kg/m  Physical Exam Vitals and nursing note reviewed.  Constitutional:      General: She is not in acute distress.    Appearance: She is well-developed.  HENT:     Head: Normocephalic and atraumatic.  Eyes:     Conjunctiva/sclera: Conjunctivae normal.  Cardiovascular:     Rate and Rhythm: Normal rate and regular rhythm.     Heart sounds: No murmur heard. Pulmonary:     Effort: Pulmonary effort is normal. No respiratory distress.     Breath sounds: Normal breath sounds.  Abdominal:     Palpations: Abdomen is soft.     Tenderness: There is no abdominal tenderness. There is no right CVA tenderness, left CVA tenderness, guarding or rebound.  Musculoskeletal:        General: No swelling.     Cervical back: Neck supple.     Right lower leg: No edema.  Left lower leg: No edema.  Skin:    General: Skin is warm and dry.     Capillary Refill: Capillary refill takes less than 2 seconds.  Neurological:     Mental Status: She is alert.  Psychiatric:        Mood and Affect: Mood normal.     ED Results / Procedures / Treatments   Labs (all labs ordered are listed, but only abnormal results are displayed) Labs Reviewed  COMPREHENSIVE METABOLIC PANEL - Abnormal; Notable for the following components:      Result Value   Sodium 130 (*)    Potassium 3.0 (*)    CO2 17 (*)    Glucose, Bld 127 (*)    All other components within normal limits  CBC WITH DIFFERENTIAL/PLATELET -  Abnormal; Notable for the following components:   WBC 14.9 (*)    Neutro Abs 13.0 (*)    All other components within normal limits  URINALYSIS, ROUTINE W REFLEX MICROSCOPIC - Abnormal; Notable for the following components:   Hgb urine dipstick TRACE (*)    Ketones, ur >=80 (*)    All other components within normal limits  RAPID URINE DRUG SCREEN, HOSP PERFORMED - Abnormal; Notable for the following components:   Tetrahydrocannabinol POSITIVE (*)    All other components within normal limits  MAGNESIUM - Abnormal; Notable for the following components:   Magnesium 1.4 (*)    All other components within normal limits  URINALYSIS, MICROSCOPIC (REFLEX) - Abnormal; Notable for the following components:   Bacteria, UA FEW (*)    All other components within normal limits  LIPASE, BLOOD  HCG, QUANTITATIVE, PREGNANCY    EKG None  Radiology No results found.  Procedures Procedures    Medications Ordered in ED Medications  lactated ringers bolus 1,000 mL (1,000 mLs Intravenous Patient Refused/Not Given 11/12/22 1708)  droperidol (INAPSINE) 2.5 MG/ML injection 2.5 mg (2.5 mg Intravenous Given 11/12/22 1558)  magnesium oxide (MAG-OX) tablet 400 mg (400 mg Oral Given 11/12/22 1708)  potassium chloride SA (KLOR-CON M) CR tablet 40 mEq (40 mEq Oral Given 11/12/22 1708)    ED Course/ Medical Decision Making/ A&P                                 Medical Decision Making Amount and/or Complexity of Data Reviewed Labs: ordered.  Risk OTC drugs. Prescription drug management.   This patient presents to the ED for concern of nausea, vomiting, this involves an extensive number of treatment options, and is a complaint that carries with it a high risk of complications and morbidity.  The differential diagnosis includes hyperemesis cannabinoid syndrome, viral gastroenteritis, PUD, gastritis, pancreatitis, CBD pathology, cholecystitis, SBO/LBO, volvulus, diverticulitis, appendicitis, pyelonephritis,  nephrolithiasis   Co morbidities that complicate the patient evaluation  See HPI   Additional history obtained:  Additional history obtained from EMR External records from outside source obtained and reviewed including hospital records   Lab Tests:  I Ordered, and personally interpreted labs.  The pertinent results include: Leukocytosis of 14.9.  No evidence of anemia.  Platelets within normal range.  Multiple electrolyte abnormalities including sodium 130, potassium 3.0, bicarb of 17.  No transaminitis.  No renal dysfunction.  UA significant for few bacteria but with 0-5 WBCs, negative nitrite/leukocytes.  UDS positive for THC.  hCG negative.  Lipase within normal limits   Imaging Studies ordered:  N/a   Cardiac Monitoring: / EKG:  The patient was maintained on a cardiac monitor.  I personally viewed and interpreted the cardiac monitored which showed an underlying rhythm of: sinus rhythm   Consultations Obtained:  N/a   Problem List / ED Course / Critical interventions / Medication management  Nausea and vomiting.  Cannabis use I ordered medication including 1 L lactated Ringer's, droperidol, potassium chloride, magnesium oxide   Reevaluation of the patient after these medicines showed that the patient resolved I have reviewed the patients home medicines and have made adjustments as needed   Social Determinants of Health:  Marijuana use   Test / Admission - Considered:  Nausea and vomiting.  Cannabis use Vitals signs within normal range and stable throughout visit. Laboratory/imaging studies significant for: See above 27 year old female presents emergency department with complaints of nausea, vomiting, loose bowel movements since waking up this morning.  Patient with known history hyperemesis cannabinoid syndrome but most recent cannabis use yesterday.  Patient initially reported intolerance of p.o. today with anything.  Patient's workup overall reassuring.   Patient with electrolyte abnormalities including hypokalemia as well as hypomagnesia supplemented while in the ED.  Considered CT imaging of patient's abdomen but with serial benign, nontender abdominal exams.  With administration of IV fluids and antiemetic, patient noted resolution of symptoms.  Subsequent passage of p.o. trial.  I suspect patient's symptoms are likely secondary to viral illness versus/in addition to hyperemesis cannabinoid syndrome.  Recommend antiemetic in the outpatient setting as needed, bland diet and follow-up with primary care in the outpatient setting.  Patient states she already has potassium as well as magnesium supplements and does not need refills.  Treatment plan discussed at length with patient and she acknowledged understanding was agreeable to said plan.  Patient overall well-appearing, afebrile in no acute distress. Worrisome signs and symptoms were discussed with the patient, and the patient acknowledged understanding to return to the ED if noticed. Patient was stable upon discharge.          Final Clinical Impression(s) / ED Diagnoses Final diagnoses:  Nausea and vomiting, unspecified vomiting type  Cannabis use disorder  Hypokalemia  Hypomagnesemia    Rx / DC Orders ED Discharge Orders          Ordered    ondansetron (ZOFRAN-ODT) 4 MG disintegrating tablet  Every 8 hours PRN        11/12/22 1720    promethazine (PHENERGAN) 25 MG suppository  Every 6 hours PRN        11/12/22 1720              Peter Garter, [place] 11/12/22 Gareth Eagle, MD 11/12/22 2300

## 2022-11-13 ENCOUNTER — Encounter (HOSPITAL_COMMUNITY): Payer: Self-pay

## 2022-11-13 ENCOUNTER — Emergency Department (HOSPITAL_COMMUNITY)
Admission: EM | Admit: 2022-11-13 | Discharge: 2022-11-13 | Disposition: A | Discharge status: Home / Self Care | Payer: MEDICAID | Attending: Emergency Medicine | Admitting: Emergency Medicine

## 2022-11-13 ENCOUNTER — Other Ambulatory Visit: Payer: Self-pay

## 2022-11-13 DIAGNOSIS — D72829 Elevated white blood cell count, unspecified: Secondary | ICD-10-CM | POA: Insufficient documentation

## 2022-11-13 DIAGNOSIS — E860 Dehydration: Secondary | ICD-10-CM | POA: Insufficient documentation

## 2022-11-13 DIAGNOSIS — F17210 Nicotine dependence, cigarettes, uncomplicated: Secondary | ICD-10-CM | POA: Insufficient documentation

## 2022-11-13 DIAGNOSIS — E876 Hypokalemia: Secondary | ICD-10-CM | POA: Diagnosis not present

## 2022-11-13 DIAGNOSIS — R531 Weakness: Secondary | ICD-10-CM | POA: Diagnosis not present

## 2022-11-13 LAB — CBC
HCT: 38.5 % (ref 36.0–46.0)
Hemoglobin: 12.8 g/dL (ref 12.0–15.0)
MCH: 27.8 pg (ref 26.0–34.0)
MCHC: 33.2 g/dL (ref 30.0–36.0)
MCV: 83.7 fL (ref 80.0–100.0)
Platelets: 289 10*3/uL (ref 150–400)
RBC: 4.6 MIL/uL (ref 3.87–5.11)
RDW: 13.7 % (ref 11.5–15.5)
WBC: 16.3 10*3/uL — ABNORMAL HIGH (ref 4.0–10.5)
nRBC: 0 % (ref 0.0–0.2)

## 2022-11-13 LAB — COMPREHENSIVE METABOLIC PANEL
ALT: 23 U/L (ref 0–44)
AST: 25 U/L (ref 15–41)
Albumin: 3.8 g/dL (ref 3.5–5.0)
Alkaline Phosphatase: 71 U/L (ref 38–126)
Anion gap: 12 (ref 5–15)
BUN: 5 mg/dL — ABNORMAL LOW (ref 6–20)
CO2: 21 mmol/L — ABNORMAL LOW (ref 22–32)
Calcium: 9.1 mg/dL (ref 8.9–10.3)
Chloride: 102 mmol/L (ref 98–111)
Creatinine, Ser: 0.82 mg/dL (ref 0.44–1.00)
GFR, Estimated: >60 mL/min (ref >60)
Glucose, Bld: 123 mg/dL — ABNORMAL HIGH (ref 70–99)
Potassium: 3.3 mmol/L — ABNORMAL LOW (ref 3.5–5.1)
Sodium: 135 mmol/L (ref 135–145)
Total Bilirubin: 1 mg/dL (ref 0.3–1.2)
Total Protein: 7 g/dL (ref 6.5–8.1)

## 2022-11-13 LAB — URINALYSIS, ROUTINE W REFLEX MICROSCOPIC
Bilirubin Urine: NEGATIVE
Glucose, UA: NEGATIVE mg/dL
Hgb urine dipstick: NEGATIVE
Ketones, ur: NEGATIVE mg/dL
Leukocytes,Ua: NEGATIVE
Nitrite: NEGATIVE
Protein, ur: NEGATIVE mg/dL
Specific Gravity, Urine: 1.001 — ABNORMAL LOW (ref 1.005–1.030)
pH: 6 (ref 5.0–8.0)

## 2022-11-13 LAB — LIPASE, BLOOD: Lipase: 26 U/L (ref 11–51)

## 2022-11-13 LAB — HCG, QUANTITATIVE, PREGNANCY: hCG, Beta Chain, Quant, S: 2 m[IU]/mL (ref <5)

## 2022-11-13 MED ORDER — POTASSIUM CHLORIDE CRYS ER 20 MEQ PO TBCR
60.0000 meq | EXTENDED_RELEASE_TABLET | Freq: Once | ORAL | Status: AC
  Administered 2022-11-13: 60 meq via ORAL
  Filled 2022-11-13: qty 3

## 2022-11-13 MED ORDER — SODIUM CHLORIDE 0.9 % IV BOLUS
1000.0000 mL | Freq: Once | INTRAVENOUS | Status: AC
  Administered 2022-11-13: 1000 mL via INTRAVENOUS

## 2022-11-13 NOTE — Discharge Instructions (Signed)
We evaluated you for your dehydration.  We obtained laboratory testing which was overall reassuring.  Your symptoms improved with IV fluids.  Your potassium was still slightly low so we did give you 1 additional dose of potassium.  Please return to the emergency department if you have any new or worsening symptoms.

## 2022-11-13 NOTE — ED Triage Notes (Signed)
Ptg BIB EMS due to abd pain, n/v/d. Pt states she smoked marijuana a couple days ago and has hx of hyperemesis. EMS gave 4mg  of zofran.

## 2022-11-13 NOTE — ED Provider Notes (Signed)
[place] EMERGENCY DEPARTMENT AT [facility] Provider Note  CSN: [id number] Arrival date & time: 11/13/22 1104  Chief Complaint(s) Abdominal Pain  HPI Christine Trujillo is a 27 y.o. female presenting to the emergency department with feeling dehydrated.  The patient reports that she was in the emergency department yesterday for nausea, vomiting, diarrhea, abdominal pain in the setting of her cyclical vomiting.  She reports that she gets this when she is smoked marijuana and she did smoke marijuana recently.  She actually reports that the symptoms have all resolved.  She just feels some dry mouth and generalized weakness.  No ongoing abdominal pain or any other symptoms such as fevers or chills, dysuria, chest pain or shortness of breath.  Overall feels well.   Past Medical History Past Medical History:  Diagnosis Date   Anxiety    Cannabinoid hyperemesis syndrome    Depression    GERD (gastroesophageal reflux disease)    History of anemia    during pregnancy   History of gestational diabetes    Ovarian cyst    left   Pelvic pain    Upper respiratory symptom    symptoms started 02-16-2021 cough/ runny nose;  pcp visit 03-10-2021 with sore throat/ congestion/ sob/ body aches/ cough but no fever, negative covid/ flu/ strep resultsin epic  (03-13-2021  pt stated today only has nonproductive cough)   Patient Active Problem List   Diagnosis Date Noted   Pelvic pain in female 03/17/2021   Request for sterilization 03/17/2021   Gastroesophageal reflux disease 03/04/2021   Pain of upper abdomen    Non-intractable vomiting    Family history- stomach cancer    Diarrhea    Change in bowel habits    Depression, major, single episode, mild (HCC) 01/23/2020   Capsulitis of right shoulder 10/05/2019   GAD (generalized anxiety disorder) 04/20/2019   Dermatitis 05/10/2018   Closed fracture of tooth 05/10/2018   Chronic midline low back pain without sciatica 07/21/2016   BMI  45.0-49.9, adult (HCC) 04/22/2016   Depression, recurrent (HCC) 04/22/2016   Adnexal cyst 03/25/2016   Home Medication(s) Prior to Admission medications   Medication Sig Start Date End Date Taking? Authorizing Provider  Biotin w/ Vitamins C & E (HAIR/SKIN/NAILS PO) Take 1 tablet by mouth daily.    [provider]  Multiple Vitamins-Minerals (MULTIVITAMIN WITH MINERALS) tablet Take 1 tablet by mouth daily.    [provider]  ondansetron (ZOFRAN-ODT) 4 MG disintegrating tablet Take 1 tablet (4 mg total) by mouth every 8 (eight) hours as needed for nausea or vomiting. 11/12/22   Sherian Maroon A, PA  potassium chloride SA (KLOR-CON M) 20 MEQ tablet Take 2 tablets (40 mEq total) by mouth 2 (two) times daily. 04/14/22   Sharlene Dory, DO  promethazine (PHENERGAN) 25 MG suppository Place 1 suppository (25 mg total) rectally every 6 (six) hours as needed for nausea or vomiting. 11/12/22   Peter Garter, PA  sertraline (ZOLOFT) 50 MG tablet Take 1 tablet (50 mg total) by mouth daily. 08/23/22   Sharlene Dory, DO  Past Surgical History Past Surgical History:  Procedure Laterality Date   BIOPSY  10/22/2020   Procedure: BIOPSY;  Surgeon: Shellia Cleverly, DO;  Location: [facility];  Service: Gastroenterology;;   CHOLECYSTECTOMY N/A 12/02/2016   Procedure: LAPAROSCOPIC CHOLECYSTECTOMY WITH INTRAOPERATIVE CHOLANGIOGRAM;  Surgeon: Ovidio Kin, MD;  Location: [facility];  Service: General;  Laterality: N/A;   COLONOSCOPY WITH PROPOFOL N/A 10/22/2020   Procedure: COLONOSCOPY WITH PROPOFOL;  Surgeon: Shellia Cleverly, DO;  Location: [facility];  Service: Gastroenterology;  Laterality: N/A;   ESOPHAGOGASTRODUODENOSCOPY (EGD) WITH PROPOFOL N/A 10/22/2020   Procedure: ESOPHAGOGASTRODUODENOSCOPY (EGD) WITH PROPOFOL;  Surgeon: Shellia Cleverly, DO;  Location: [facility];  Service: Gastroenterology;  Laterality: N/A;   ESOPHAGOGASTRODUODENOSCOPY (EGD) WITH PROPOFOL N/A 10/22/2020   Procedure: ESOPHAGOGASTRODUODENOSCOPY (EGD) WITH PROPOFOL;  Surgeon: Shellia Cleverly, DO;  Location: [facility];  Service: Gastroenterology;  Laterality: N/A;   LAPAROSCOPIC OVARIAN CYSTECTOMY Left 03/17/2021   Procedure: LAPAROSCOPIC Resection of Adnexal Mass;  Surgeon: Willodean Rosenthal, MD;  Location: [facility];  Service: Gynecology;  Laterality: Left;   LAPAROSCOPIC SALPINGO OOPHERECTOMY Bilateral 03/17/2021   Procedure: LAPAROSCOPIC BIALTERAL SALPINGECTOMY;  Surgeon: Willodean Rosenthal, MD;  Location[facility];  Service: Gynecology;  Laterality: Bilateral;   TUBAL LIGATION     pt unsure of affected side (procedure on 03/17/21)   WISDOM TOOTH EXTRACTION     Family History Family History  Problem Relation Age of Onset   Diabetes Mother    Heart disease Mother        CHF   Stomach cancer Brother    Colon cancer Neg Hx    Pancreatic cancer Neg Hx    Esophageal cancer Neg Hx    Liver disease Neg Hx     Social History Social History   Tobacco Use   Smoking status: Some Days    Current packs/day: 0.50    Average packs/day: 0.5 packs/day for 5.0 years (2.5 ttl pk-yrs)    Types: Cigarettes   Smokeless tobacco: Never  Vaping Use   Vaping status: Never Used  Substance Use Topics   Alcohol use: Never   Drug use: Yes    Frequency: 3.0 times per week    Types: Marijuana    Comment: per pt smokes daily; last used today 07/09/21   Allergies Patient has no known allergies.  Review of Systems Review of Systems  All other systems reviewed and are negative.   Physical Exam Vital Signs  I have reviewed the triage vital signs BP 138/89   Pulse 69   Temp 98.8 F (37.1 C) (Oral)   Resp 16   Ht 5' (1.524 m)   Wt 81.6 kg   LMP  (LMP Unknown)   SpO2 100%   BMI 35.15 kg/m  Physical  Exam Vitals and nursing note reviewed.  Constitutional:      General: She is not in acute distress.    Appearance: She is well-developed.  HENT:     Head: Normocephalic and atraumatic.     Mouth/Throat:     Mouth: Mucous membranes are moist.  Eyes:     Pupils: Pupils are equal, round, and reactive to light.  Cardiovascular:     Rate and Rhythm: Normal rate and regular rhythm.     Heart sounds: No murmur heard. Pulmonary:     Effort: Pulmonary effort is normal. No respiratory distress.     Breath sounds: Normal breath sounds.  Abdominal:     General: Abdomen is flat.  Palpations: Abdomen is soft.     Tenderness: There is no abdominal tenderness. There is no right CVA tenderness or left CVA tenderness.  Musculoskeletal:        General: No tenderness.     Right lower leg: No edema.     Left lower leg: No edema.  Skin:    General: Skin is warm and dry.  Neurological:     General: No focal deficit present.     Mental Status: She is alert. Mental status is at baseline.  Psychiatric:        Mood and Affect: Mood normal.        Behavior: Behavior normal.     ED Results and Treatments Labs (all labs ordered are listed, but only abnormal results are displayed) Labs Reviewed  COMPREHENSIVE METABOLIC PANEL - Abnormal; Notable for the following components:      Result Value   Potassium 3.3 (*)    CO2 21 (*)    Glucose, Bld 123 (*)    BUN 5 (*)    All other components within normal limits  CBC - Abnormal; Notable for the following components:   WBC 16.3 (*)    All other components within normal limits  URINALYSIS, ROUTINE W REFLEX MICROSCOPIC - Abnormal; Notable for the following components:   Color, Urine COLORLESS (*)    Specific Gravity, Urine 1.001 (*)    All other components within normal limits  LIPASE, BLOOD  HCG, QUANTITATIVE, PREGNANCY                                                                                                                           Radiology No results found.  Pertinent labs & imaging results that were available during my care of the patient were reviewed by me and considered in my medical decision making (see MDM for details).  Medications Ordered in ED Medications  sodium chloride 0.9 % bolus 1,000 mL (0 mLs Intravenous Stopped 11/13/22 1310)  potassium chloride SA (KLOR-CON M) CR tablet 60 mEq (60 mEq Oral Given 11/13/22 1322)                                                                                                                                     Procedures Procedures  (including critical care time)  Medical Decision Making / ED Course   MDM:  27 year old female presenting because she  feels dehydrated.  Patient overall very well-appearing.  Reviewed ER visit from yesterday.  Patient was seen for cyclic vomiting syndrome.  She reports that the symptoms have all resolved.  She came into the emergency department because she was feeling dehydrated.  Labs are reassuring without significant signs of dehydration, does show mildly low CO2 and mild hypokalemia.  She received IV fluids and reports she feels totally back to normal.  Triage note reported that she was having abdominal pain, nausea, vomiting, diarrhea, however patient denies this to me.  She reports that she has been abstinent from marijuana since she smoking a few days ago which started her symptoms.  Patient request discharge.  Her labs are notable for leukocytosis which appears somewhat chronic.  She has no abdominal tenderness to suggest any acute ongoing intra-abdominal process such as appendicitis, cholecystitis, perforation, obstruction.  Will discharge patient to home. All questions answered. Patient comfortable with plan of discharge. Return precautions discussed with patient and specified on the after visit summary.        Additional history obtained: -Additional history obtained from ems -External records from outside source  obtained and reviewed including: Chart review including previous notes, labs, imaging, consultation notes including previous er visit    Lab Tests: -I ordered, reviewed, and interpreted labs.   The pertinent results include:   Labs Reviewed  COMPREHENSIVE METABOLIC PANEL - Abnormal; Notable for the following components:      Result Value   Potassium 3.3 (*)    CO2 21 (*)    Glucose, Bld 123 (*)    BUN 5 (*)    All other components within normal limits  CBC - Abnormal; Notable for the following components:   WBC 16.3 (*)    All other components within normal limits  URINALYSIS, ROUTINE W REFLEX MICROSCOPIC - Abnormal; Notable for the following components:   Color, Urine COLORLESS (*)    Specific Gravity, Urine 1.001 (*)    All other components within normal limits  LIPASE, BLOOD  HCG, QUANTITATIVE, PREGNANCY    Notable for leukocytosis    Medicines ordered and prescription drug management: Meds ordered this encounter  Medications   sodium chloride 0.9 % bolus 1,000 mL   potassium chloride SA (KLOR-CON M) CR tablet 60 mEq    -I have reviewed the patients home medicines and have made adjustments as needed   Social Determinants of Health:  Diagnosis or treatment significantly limited by social determinants of health: obesity   Reevaluation: After the interventions noted above, I reevaluated the patient and found that their symptoms have resolved  Co morbidities that complicate the patient evaluation  Past Medical History:  Diagnosis Date   Anxiety    Cannabinoid hyperemesis syndrome    Depression    GERD (gastroesophageal reflux disease)    History of anemia    during pregnancy   History of gestational diabetes    Ovarian cyst    left   Pelvic pain    Upper respiratory symptom    symptoms started 02-16-2021 cough/ runny nose;  pcp visit 03-10-2021 with sore throat/ congestion/ sob/ body aches/ cough but no fever, negative covid/ flu/ strep resultsin epic   (03-13-2021  pt stated today only has nonproductive cough)      Dispostion: Disposition decision including need for hospitalization was considered, and patient discharged from emergency department.    Final Clinical Impression(s) / ED Diagnoses Final diagnoses:  Dehydration     This chart was dictated using voice recognition software.  Despite best efforts to proofread,  errors can occur which can change the documentation meaning.    Lonell Grandchild, MD 11/13/22 [id number]

## 2022-11-16 ENCOUNTER — Telehealth: Payer: Self-pay | Admitting: Family Medicine

## 2022-11-16 NOTE — Telephone Encounter (Signed)
Pt called and stated that she discuss birth control options w/ Dr. Carmelia Roller during her May appt. She is calling back now to discuss the direction she would like to take with Birth control. Please call and advise pt.

## 2022-11-16 NOTE — Telephone Encounter (Signed)
She will need an appointment

## 2022-11-17 NOTE — Telephone Encounter (Signed)
Called pt back. Appt has been scheduled.

## 2022-11-22 ENCOUNTER — Other Ambulatory Visit (HOSPITAL_BASED_OUTPATIENT_CLINIC_OR_DEPARTMENT_OTHER): Payer: Self-pay

## 2022-11-22 ENCOUNTER — Encounter: Payer: Self-pay | Admitting: Family Medicine

## 2022-11-22 ENCOUNTER — Ambulatory Visit (INDEPENDENT_AMBULATORY_CARE_PROVIDER_SITE_OTHER): Payer: MEDICAID | Admitting: Family Medicine

## 2022-11-22 VITALS — BP 130/82 | HR 78 | Temp 99.2°F | Ht 63.0 in | Wt 255.5 lb

## 2022-11-22 DIAGNOSIS — F339 Major depressive disorder, recurrent, unspecified: Secondary | ICD-10-CM

## 2022-11-22 MED ORDER — SERTRALINE HCL 50 MG PO TABS
50.0000 mg | ORAL_TABLET | Freq: Every day | ORAL | 1 refills | Status: DC
Start: 2022-11-22 — End: 2022-12-21
  Filled 2022-11-22: qty 30, 30d supply, fill #0

## 2022-11-22 NOTE — Patient Instructions (Signed)
Aim to do some physical exertion for 150 minutes per week. This is typically divided into 5 days per week, 30 minutes per day. The activity should be enough to get your heart rate up. Anything is better than nothing if you have time constraints. ? ?Coping skills ?Choose 5 that work for you: ?Take a deep breath ?Count to 20 ?Read a book ?Do a puzzle ?Meditate ?Bake ?Sing ?Knit ?Garden ?Pray ?Go outside ?Call a friend ?Listen to music ?Take a walk ?Color ?Send a note ?Take a bath ?Watch a movie ?Be alone in a quiet place ?Pet an animal ?Visit a friend ?Journal ?Exercise ?Stretch  ? ?Please consider counseling. Contact [phone] to schedule an appointment or inquire about cost/insurance coverage. ? ?[facility] located at [address], [place], [place].  Phone number = [phone].  Dr. Onoriode Edeh - Adult Psychiatry.  ?  ?[facility] located at [address], [place], [place]. Phone number = [phone]. ?  ?[facility] located at [address], [place], [place].  Phone number = [phone]. ?  ?[facility] located at [address], [place], .  Phone number = [phone]. ? ?Let us know if you need anything. ?

## 2022-11-22 NOTE — Progress Notes (Signed)
Chief Complaint  Patient presents with   Contraception   Depression    Subjective Christine Trujillo presents for f/u anxiety/depression.  Pt is currently being treated with nothing.  Over the past 18 months, she has been having worsening symptoms.  She feels devalued by her partner who is also the father of her child. No thoughts of harming self or others. No self-medication with alcohol, prescription drugs or illicit drugs. Pt is not following with a counselor/psychologist. She has historically done well with Zoloft.  Past Medical History:  Diagnosis Date   Anxiety    Cannabinoid hyperemesis syndrome    Depression    GERD (gastroesophageal reflux disease)    History of anemia    during pregnancy   History of gestational diabetes    Ovarian cyst    left   Pelvic pain    Upper respiratory symptom    symptoms started 02-16-2021 cough/ runny nose;  pcp visit 03-10-2021 with sore throat/ congestion/ sob/ body aches/ cough but no fever, negative covid/ flu/ strep resultsin epic  (03-13-2021  pt stated today only has nonproductive cough)   Allergies as of 11/22/2022   No Known Allergies      Medication List        Accurate as of November 22, 2022  3:20 PM. If you have any questions, ask your nurse or doctor.          HAIR/SKIN/NAILS PO Take 1 tablet by mouth daily.   multivitamin with minerals tablet Take 1 tablet by mouth daily.   ondansetron 4 MG disintegrating tablet Commonly known as: ZOFRAN-ODT Take 1 tablet (4 mg total) by mouth every 8 (eight) hours as needed for nausea or vomiting.   potassium chloride SA 20 MEQ tablet Commonly known as: KLOR-CON M Take 2 tablets (40 mEq total) by mouth 2 (two) times daily.   promethazine 25 MG suppository Commonly known as: PHENERGAN Place 1 suppository (25 mg total) rectally every 6 (six) hours as needed for nausea or vomiting.   sertraline 50 MG tablet Commonly known as: ZOLOFT Take 1 tablet (50 mg total) by mouth  daily. Take 1/2 tab daily for first 2 weeks. What changed: additional instructions Changed by: Sharlene Dory        Exam BP 130/82 (BP Location: Left Arm, Patient Position: Sitting, Cuff Size: Large)   Pulse 78   Temp 99.2 F (37.3 C) (Oral)   Ht 5\' 3"  (1.6 m)   Wt 255 lb 8 oz (115.9 kg)   LMP  (LMP Unknown)   SpO2 99%   BMI 45.26 kg/m  General:  well developed, well nourished, in no apparent distress Lungs:  No respiratory distress Psych: well oriented with normal range of affect and age-appropriate judgement/insight, alert and oriented x4.  Assessment and Plan  Depression, recurrent (HCC) - Plan: sertraline (ZOLOFT) 50 MG tablet  Chronic, not controlled.  She plans on moving away from her current relationship situation which should help with her overall mood.  In the meanwhile, we will restart Zoloft at 25 mg daily and increase to 50 mg daily after 2 weeks.  Counseled on exercise.  Counseling resources provided.  Anxiety coping techniques provided.  I will see her in 6 weeks to recheck. The patient voiced understanding and agreement to the plan.  [name] [place], DO 11/22/22 3:20 PM

## 2022-11-24 ENCOUNTER — Ambulatory Visit: Payer: MEDICAID | Admitting: Family Medicine

## 2022-11-24 ENCOUNTER — Encounter: Payer: Self-pay | Admitting: Family Medicine

## 2022-11-24 DIAGNOSIS — Z3009 Encounter for other general counseling and advice on contraception: Secondary | ICD-10-CM | POA: Diagnosis not present

## 2022-11-24 DIAGNOSIS — Z30017 Encounter for initial prescription of implantable subdermal contraceptive: Secondary | ICD-10-CM

## 2022-11-24 LAB — POCT URINE PREGNANCY: Preg Test, Ur: NEGATIVE

## 2022-11-24 MED ORDER — ETONOGESTREL 68 MG ~~LOC~~ IMPL
68.0000 mg | DRUG_IMPLANT | Freq: Once | SUBCUTANEOUS | Status: AC
Start: 2022-11-24 — End: 2022-11-24
  Administered 2022-11-24: 68 mg via SUBCUTANEOUS

## 2022-11-24 NOTE — Addendum Note (Signed)
Addended by: Scharlene Gloss B on: 11/24/2022 01:33 PM   Modules accepted: Orders

## 2022-11-24 NOTE — Patient Instructions (Addendum)
Do not shower for the rest of the day. When you do wash it, use only soap and water. Do not vigorously scrub.  Keep the area clean and dry.   Things to look out for: increasing pain not relieved by ibuprofen/acetaminophen, fevers, spreading redness, drainage of pus, or foul odor.  Ice/cold pack over area for 10-15 min twice daily.  Use back up contraception for the next 7 days if you are sexually active.   Let us know if you need anything.

## 2022-11-24 NOTE — Progress Notes (Signed)
Chief Complaint  Patient presents with   Procedure    Subjective: Patient is a 27 y.o. female here for discussion of BC.  Patient is coming off of the Depo injection.  She states she would not remember to take a pill.  She no longer wants to have children.  She is interested in the Nexplanon.  Past Medical History:  Diagnosis Date   Anxiety    Cannabinoid hyperemesis syndrome    Depression    GERD (gastroesophageal reflux disease)    History of anemia    during pregnancy   History of gestational diabetes    Ovarian cyst    left   Pelvic pain    Upper respiratory symptom    symptoms started 02-16-2021 cough/ runny nose;  pcp visit 03-10-2021 with sore throat/ congestion/ sob/ body aches/ cough but no fever, negative covid/ flu/ strep resultsin epic  (03-13-2021  pt stated today only has nonproductive cough)    Objective: General: Awake, appears stated age Lungs: No accessory muscle use Psych: Age appropriate judgment and insight, normal affect and mood  Procedure Note, Nexplanon placement: Informed consent obtained. The patient was placed in a comfortable supine position and the non-dominant arm, left, was positioned to access the area posterior to the sulcus between the bicep and triceps muscles.  Measurements were made, and markings were placed at 8 cm, 10 cm, 12 cm and 14 cm from the medial epicondyle of the humerus.  The area was prepped with alcohol and a 27 gauge needle was used to inject 3 mL of 1% lidocaine with epinephrine.  The area was then cleaned with betadine. Sterile gloves were then utilized. The Nexplanon trocar was then inserted at the end of anesthetized area and pushed gently through the subcutaneous tissue along the line of the sulcus.  The seal was broken and the implant was held in place while the trocar was withdrawn.  The implant was then palpated by both the patient and me.  The arm was cleansed and the puncture site from the trocar covered with  triple antibiotic ointment and pressure dressing.  Hemostasis was observed at the site. There were no complications noted.  The patient tolerated the procedure well.  She was instructed that the device must be removed in three years.  Assessment and Plan: Birth control counseling - Plan: POCT urine pregnancy  Nexplanon insertion - Plan: POCT urine pregnancy  Nexplanon insertion today. Remove in 3 years. Backup contraception for 7 d. Warning signs and symptoms verbalized and written down in AVS.  The patient voiced understanding and agreement to the plan.  [name] [place], DO 11/24/22  1:17 PM

## 2022-12-21 ENCOUNTER — Other Ambulatory Visit (HOSPITAL_BASED_OUTPATIENT_CLINIC_OR_DEPARTMENT_OTHER): Payer: Self-pay

## 2022-12-21 ENCOUNTER — Telehealth: Payer: Self-pay | Admitting: Family Medicine

## 2022-12-21 MED ORDER — DULOXETINE HCL 20 MG PO CPEP: 20.0000 mg | ORAL_CAPSULE | Freq: Every day | ORAL | 3 refills | Status: DC

## 2022-12-21 NOTE — Telephone Encounter (Signed)
Pt called stating that she is having issues with her Zoloft causing nausea and dizziness. Pt stated that she is taking only 25mg  with food but she has not been able to tolerate it. Please Advise.

## 2022-12-21 NOTE — Telephone Encounter (Signed)
Sent a different one to replace. Have her f/u in 3 weeks to reck. Ty.

## 2022-12-21 NOTE — Addendum Note (Signed)
Addended by: Radene Gunning on: 12/21/2022 12:17 PM   Modules accepted: Orders

## 2022-12-21 NOTE — Telephone Encounter (Signed)
Called informed of PCP instructions. She will call in 3 weeks to schedule appt.

## 2022-12-22 ENCOUNTER — Encounter (HOSPITAL_BASED_OUTPATIENT_CLINIC_OR_DEPARTMENT_OTHER): Payer: Self-pay

## 2022-12-22 ENCOUNTER — Emergency Department (HOSPITAL_BASED_OUTPATIENT_CLINIC_OR_DEPARTMENT_OTHER): Payer: MEDICAID

## 2022-12-22 ENCOUNTER — Other Ambulatory Visit: Payer: Self-pay

## 2022-12-22 ENCOUNTER — Emergency Department (HOSPITAL_BASED_OUTPATIENT_CLINIC_OR_DEPARTMENT_OTHER)
Admission: EM | Admit: 2022-12-22 | Discharge: 2022-12-22 | Discharge status: Against Medical Advice | Payer: MEDICAID | Attending: Emergency Medicine | Admitting: Emergency Medicine

## 2022-12-22 DIAGNOSIS — Z20822 Contact with and (suspected) exposure to covid-19: Secondary | ICD-10-CM | POA: Diagnosis not present

## 2022-12-22 DIAGNOSIS — R197 Diarrhea, unspecified: Secondary | ICD-10-CM | POA: Insufficient documentation

## 2022-12-22 DIAGNOSIS — F12188 Cannabis abuse with other cannabis-induced disorder: Secondary | ICD-10-CM | POA: Insufficient documentation

## 2022-12-22 DIAGNOSIS — R112 Nausea with vomiting, unspecified: Secondary | ICD-10-CM | POA: Diagnosis present

## 2022-12-22 LAB — URINALYSIS, MICROSCOPIC (REFLEX)

## 2022-12-22 LAB — COMPREHENSIVE METABOLIC PANEL
ALT: 14 U/L (ref 0–44)
AST: 17 U/L (ref 15–41)
Albumin: 4.1 g/dL (ref 3.5–5.0)
Alkaline Phosphatase: 71 U/L (ref 38–126)
Anion gap: 13 (ref 5–15)
BUN: 10 mg/dL (ref 6–20)
CO2: 19 mmol/L — ABNORMAL LOW (ref 22–32)
Calcium: 9.2 mg/dL (ref 8.9–10.3)
Chloride: 105 mmol/L (ref 98–111)
Creatinine, Ser: 0.9 mg/dL (ref 0.44–1.00)
GFR, Estimated: >60 mL/min (ref >60)
Glucose, Bld: 157 mg/dL — ABNORMAL HIGH (ref 70–99)
Potassium: 3.6 mmol/L (ref 3.5–5.1)
Sodium: 137 mmol/L (ref 135–145)
Total Bilirubin: 1 mg/dL (ref 0.3–1.2)
Total Protein: 7.6 g/dL (ref 6.5–8.1)

## 2022-12-22 LAB — LIPASE, BLOOD: Lipase: 21 U/L (ref 11–51)

## 2022-12-22 LAB — CBC WITH DIFFERENTIAL/PLATELET
Abs Immature Granulocytes: 0.03 10*3/uL (ref 0.00–0.07)
Basophils Absolute: 0 10*3/uL (ref 0.0–0.1)
Basophils Relative: 0 %
Eosinophils Absolute: 0 10*3/uL (ref 0.0–0.5)
Eosinophils Relative: 0 %
HCT: 39.9 % (ref 36.0–46.0)
Hemoglobin: 13.4 g/dL (ref 12.0–15.0)
Immature Granulocytes: 0 %
Lymphocytes Relative: 13 %
Lymphs Abs: 1.5 10*3/uL (ref 0.7–4.0)
MCH: 27.9 pg (ref 26.0–34.0)
MCHC: 33.6 g/dL (ref 30.0–36.0)
MCV: 83.1 fL (ref 80.0–100.0)
Monocytes Absolute: 0.6 10*3/uL (ref 0.1–1.0)
Monocytes Relative: 5 %
Neutro Abs: 9.9 10*3/uL — ABNORMAL HIGH (ref 1.7–7.7)
Neutrophils Relative %: 82 %
Platelets: 319 10*3/uL (ref 150–400)
RBC: 4.8 MIL/uL (ref 3.87–5.11)
RDW: 13.2 % (ref 11.5–15.5)
WBC: 12.1 10*3/uL — ABNORMAL HIGH (ref 4.0–10.5)
nRBC: 0 % (ref 0.0–0.2)

## 2022-12-22 LAB — URINALYSIS, ROUTINE W REFLEX MICROSCOPIC
Bilirubin Urine: NEGATIVE
Glucose, UA: NEGATIVE mg/dL
Hgb urine dipstick: NEGATIVE
Ketones, ur: 80 mg/dL — AB
Nitrite: NEGATIVE
Protein, ur: NEGATIVE mg/dL
Specific Gravity, Urine: 1.025 (ref 1.005–1.030)
pH: 8 (ref 5.0–8.0)

## 2022-12-22 LAB — RESP PANEL BY RT-PCR (RSV, FLU A&B, COVID)  RVPGX2
Influenza A by PCR: NEGATIVE
Influenza B by PCR: NEGATIVE
Resp Syncytial Virus by PCR: NEGATIVE
SARS Coronavirus 2 by RT PCR: NEGATIVE

## 2022-12-22 LAB — PREGNANCY, URINE: Preg Test, Ur: NEGATIVE

## 2022-12-22 MED ORDER — METOCLOPRAMIDE HCL 5 MG/ML IJ SOLN: 10.0000 mg | Freq: Once | INTRAMUSCULAR | Status: DC

## 2022-12-22 MED ORDER — DROPERIDOL 2.5 MG/ML IJ SOLN
2.5000 mg | Freq: Once | INTRAMUSCULAR | Status: AC
  Administered 2022-12-22: 2.5 mg via INTRAVENOUS
  Filled 2022-12-22: qty 2

## 2022-12-22 MED ORDER — METOCLOPRAMIDE HCL 5 MG/ML IJ SOLN
10.0000 mg | Freq: Once | INTRAMUSCULAR | Status: AC
  Administered 2022-12-22: 10 mg via INTRAVENOUS
  Filled 2022-12-22: qty 2

## 2022-12-22 MED ORDER — SODIUM CHLORIDE 0.9 % IV BOLUS
1000.0000 mL | Freq: Once | INTRAVENOUS | Status: AC
  Administered 2022-12-22: 1000 mL via INTRAVENOUS

## 2022-12-22 MED ORDER — IOHEXOL 300 MG/ML  SOLN: 100.0000 mL | Freq: Once | INTRAMUSCULAR | Status: DC | PRN

## 2022-12-22 NOTE — ED Notes (Signed)
Registration just informed staff the pt walked past her and said "I'm leaving" and refused to reveal her name or information. Pt ambulated out of ER without assistance.

## 2022-12-22 NOTE — ED Notes (Signed)
EKG deferred due to pt up and down from [facility] and cleaning herself, vomiting/complaining of bad nausea, etc. Will do EKG when pt more comfortable and willing.

## 2022-12-22 NOTE — ED Notes (Signed)
Walked in to patient lying upside down on the bed, supine. Pt was able to rotate around without incident or assistance.   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 Pt was informed she needs to quit abusing the substance because it's causing her chronic issues. The pt was dry heaving at present and advised no one has ever told her she needs to quit.   Pt was wearing adult diaper and soiled herself while staff was drawing blood and administering meds. Staff advised the pt needs to clear herself up in the bathroom and the pt refused and requested a bedside commode instead. Pt was not willing to be rolled to the bathroom. She demaneded a bedside commode be brought in.   Once the commode was brought in by [facility], tech, the pt advised she does not need to use the bathroom at this time. Chucks and adult diaper placed on counter for patient.

## 2022-12-22 NOTE — ED Notes (Addendum)
Pt left AMA after refusing to let staff check her temperature before giving another warm blanket. The pt demanded a warm blanket, but since she was having virals symptoms and her COVID had not returned, staff wanted to take her oral temp. The pt then advised she was going to leave and began making demands.   To avoid conflict, the tech notifed this medic, and the EDP was updated. The pt left as shown in other notes. Conflict avoided.   No paperwork given. NAD noted, pt ambulated without assistance or incident. The pt would not listen to staff about warnings not to drive, she kept walking towards the exit. No signatures obtained. The pt was told by multiple staff she was leaving Against Medical Advice.

## 2022-12-22 NOTE — ED Provider Notes (Signed)
[facility] Provider Note   CSN: [id number] Arrival date & time: 12/22/22  1009     History  Chief Complaint  Patient presents with   Emesis   Diarrhea    Christine Trujillo is a 27 y.o. female w/ pmhx of ovarian cyst, GERD, anxiety, cannabis hyperemesis syndrome, hx of cholecystectomy is presenting nausea, vomiting, diarrhea that has been ongoing since last night after she was eating [employer]. Pt reports she has had multiple episodes of this over the past 3 months, associated the vomiting with prior [employer] intake.  Family in the room, stating they have a child who also had [employer] and is also now having diarrhea.  Patient reports she has not taken any medication today.  Not been able to tolerate p.o. intake.  Requesting ice.  Patient has had cholecystectomy in past. Denies allergies. Reports smoking.   Emesis Associated symptoms: diarrhea   Diarrhea Associated symptoms: vomiting        Home Medications Prior to Admission medications   Medication Sig Start Date End Date Taking? Authorizing Provider  Biotin w/ Vitamins C & E (HAIR/SKIN/NAILS PO) Take 1 tablet by mouth daily.    [provider]  DULoxetine (CYMBALTA) 20 MG capsule Take 1 capsule (20 mg total) by mouth daily. 12/21/22   Sharlene Dory, DO  Multiple Vitamins-Minerals (MULTIVITAMIN WITH MINERALS) tablet Take 1 tablet by mouth daily.    [provider]  ondansetron (ZOFRAN-ODT) 4 MG disintegrating tablet Take 1 tablet (4 mg total) by mouth every 8 (eight) hours as needed for nausea or vomiting. 11/12/22   Sherian Maroon A, PA  potassium chloride SA (KLOR-CON M) 20 MEQ tablet Take 2 tablets (40 mEq total) by mouth 2 (two) times daily. 04/14/22   Sharlene Dory, DO  promethazine (PHENERGAN) 25 MG suppository Place 1 suppository (25 mg total) rectally every 6 (six) hours as needed for nausea or vomiting. 11/12/22   Peter Garter, PA      Allergies     Patient has no known allergies.    Review of Systems   Review of Systems  Gastrointestinal:  Positive for diarrhea and vomiting.    Physical Exam Updated Vital Signs BP (!) 158/103 (BP Location: Right Arm)   Pulse (!) 58   Temp (!) 97.5 F (36.4 C) (Oral)   Resp 20   Ht 5\' 3"  (1.6 m)   Wt 113.4 kg   SpO2 100%   BMI 44.29 kg/m  Physical Exam Vitals and nursing note reviewed.  Constitutional:      General: She is not in acute distress.    Appearance: She is not ill-appearing, toxic-appearing or diaphoretic.  HENT:     Head: Normocephalic and atraumatic.  Eyes:     General: No scleral icterus.    Conjunctiva/sclera: Conjunctivae normal.  Cardiovascular:     Rate and Rhythm: Normal rate and regular rhythm.     Pulses: Normal pulses.     Heart sounds: Normal heart sounds.  Pulmonary:     Effort: Pulmonary effort is normal. No respiratory distress.     Breath sounds: Normal breath sounds.  Abdominal:     General: Abdomen is flat. Bowel sounds are normal. There is no distension.     Palpations: Abdomen is soft. There is no mass.     Tenderness: There is abdominal tenderness. There is no guarding.     Hernia: No hernia is present.     Comments: TTP over general  abdomen.   Skin:    General: Skin is warm and dry.     Capillary Refill: Capillary refill takes less than 2 seconds.     Coloration: Skin is not jaundiced.     Findings: No lesion.  Neurological:     General: No focal deficit present.     Mental Status: She is alert and oriented to person, place, and time. Mental status is at baseline.     ED Results / Procedures / Treatments   Labs (all labs ordered are listed, but only abnormal results are displayed) Labs Reviewed - No data to display  EKG None  Radiology No results found.  Procedures Procedures    Medications Ordered in ED Medications - No data to display  ED Course/ Medical Decision Making/ A&P                                 Medical  Decision Making Amount and/or Complexity of Data Reviewed Labs: ordered. Radiology: ordered.  Risk Prescription drug management.   [name] 26 y.o. presented today for abd pain. Working DDx includes, but not limited to, gastroenteritis, colitis, SBO, appendicitis, cholecystitis, hepatobiliary pathology, gastritis, PUD, ACS, dissection, pancreatitis, nephrolithiasis, AAA, UTI, pyelonephritis  R/o DDx: These are considered less likely than current impression due to history of present illness, physical exam, labs/imaging findings.  Review of prior external notes: chart review   Pmhx: cannabis hyperemesis sx   Unique Tests and My Interpretation:  CBC with differential: wbc 12, less than previous WBC appears chronically elevated  CMP: unremarkable  Lipase: wnl UA: unremarkable -- no having typical UTI like symptoms  Urine Pregnancy: NEG Resp panel    Imaging:  CT Abd/Pelvis with contrast: evaluate for structural/surgical etiology of patients' severe abdominal pain --- ordered, patient left without imaging.    Problem List / ED Course / Critical interventions / Medication management  Pt presenting with nausea, vomiting, diarrhea that has been ongoing since last night after she was eating [employer]. Reports chronic vomiting, reports severe nausea and vomiting after eating [employer] last night, reports tolerating intake. Pts physical exam unremarkable, no peritoneal signs. Vitals stable, no fever. Will try to continue nausea, fluids, imaging and labs work to further characterize patient abdominal pain.  I ordered medication including Reglan and Droperidol for N,VD  Reevaluation of the patient after these medicines showed that the patient improved Patients vitals assessed. Upon arrival patient is hemodynamically stable, slightly hypertensive.   I have reviewed the patients home medicines and have made adjustments as needed   Consult: None   Plan:  Patient left AMA prior to hearing lab  results. Imaging was not performed. Lab results and UA otherwise unremarkable.          Final Clinical Impression(s) / ED Diagnoses Final diagnoses:  None    Rx / DC Orders ED Discharge Orders     None         Smitty Knudsen, PA-C 12/22/22 1229    Rozelle Logan, DO 12/23/22 [phone]

## 2022-12-22 NOTE — ED Triage Notes (Signed)
Pt presents to ED from home C/O n/v/d since last night.

## 2022-12-22 NOTE — ED Notes (Signed)
Staff advised the pt is threatening to remove her IV by herself and walk out of the facility if it is not removed immediately. Tech removed her IV to avoid harm.

## 2022-12-22 NOTE — ED Notes (Signed)
Stool Sample collected in case, no order yet.

## 2022-12-23 ENCOUNTER — Emergency Department (HOSPITAL_BASED_OUTPATIENT_CLINIC_OR_DEPARTMENT_OTHER): Payer: MEDICAID

## 2022-12-23 ENCOUNTER — Emergency Department (HOSPITAL_BASED_OUTPATIENT_CLINIC_OR_DEPARTMENT_OTHER)
Admission: EM | Admit: 2022-12-23 | Discharge: 2022-12-24 | Discharge status: Against Medical Advice | Payer: MEDICAID | Source: Home / Self Care

## 2022-12-23 ENCOUNTER — Emergency Department (HOSPITAL_BASED_OUTPATIENT_CLINIC_OR_DEPARTMENT_OTHER)
Admission: EM | Admit: 2022-12-23 | Discharge: 2022-12-23 | Disposition: A | Discharge status: Home / Self Care | Payer: MEDICAID | Attending: Emergency Medicine | Admitting: Emergency Medicine

## 2022-12-23 ENCOUNTER — Encounter (HOSPITAL_BASED_OUTPATIENT_CLINIC_OR_DEPARTMENT_OTHER): Payer: Self-pay | Admitting: Pediatrics

## 2022-12-23 ENCOUNTER — Encounter (HOSPITAL_BASED_OUTPATIENT_CLINIC_OR_DEPARTMENT_OTHER): Payer: Self-pay | Admitting: Emergency Medicine

## 2022-12-23 ENCOUNTER — Other Ambulatory Visit: Payer: Self-pay

## 2022-12-23 DIAGNOSIS — R112 Nausea with vomiting, unspecified: Secondary | ICD-10-CM | POA: Insufficient documentation

## 2022-12-23 DIAGNOSIS — Z5321 Procedure and treatment not carried out due to patient leaving prior to being seen by health care provider: Secondary | ICD-10-CM | POA: Diagnosis not present

## 2022-12-23 DIAGNOSIS — R1110 Vomiting, unspecified: Secondary | ICD-10-CM | POA: Insufficient documentation

## 2022-12-23 DIAGNOSIS — R1084 Generalized abdominal pain: Secondary | ICD-10-CM | POA: Insufficient documentation

## 2022-12-23 LAB — CBC WITH DIFFERENTIAL/PLATELET
Abs Immature Granulocytes: 0.08 10*3/uL — ABNORMAL HIGH (ref 0.00–0.07)
Basophils Absolute: 0 10*3/uL (ref 0.0–0.1)
Basophils Relative: 0 %
Eosinophils Absolute: 0 10*3/uL (ref 0.0–0.5)
Eosinophils Relative: 0 %
HCT: 37.4 % (ref 36.0–46.0)
Hemoglobin: 12.7 g/dL (ref 12.0–15.0)
Immature Granulocytes: 0 %
Lymphocytes Relative: 11 %
Lymphs Abs: 2.1 10*3/uL (ref 0.7–4.0)
MCH: 28 pg (ref 26.0–34.0)
MCHC: 34 g/dL (ref 30.0–36.0)
MCV: 82.6 fL (ref 80.0–100.0)
Monocytes Absolute: 1.7 10*3/uL — ABNORMAL HIGH (ref 0.1–1.0)
Monocytes Relative: 9 %
Neutro Abs: 14.7 10*3/uL — ABNORMAL HIGH (ref 1.7–7.7)
Neutrophils Relative %: 80 %
Platelets: 319 10*3/uL (ref 150–400)
RBC: 4.53 MIL/uL (ref 3.87–5.11)
RDW: 13.3 % (ref 11.5–15.5)
WBC: 18.6 10*3/uL — ABNORMAL HIGH (ref 4.0–10.5)
nRBC: 0 % (ref 0.0–0.2)

## 2022-12-23 LAB — COMPREHENSIVE METABOLIC PANEL
ALT: 16 U/L (ref 0–44)
AST: 15 U/L (ref 15–41)
Albumin: 4.1 g/dL (ref 3.5–5.0)
Alkaline Phosphatase: 65 U/L (ref 38–126)
Anion gap: 12 (ref 5–15)
BUN: 6 mg/dL (ref 6–20)
CO2: 22 mmol/L (ref 22–32)
Calcium: 9.2 mg/dL (ref 8.9–10.3)
Chloride: 100 mmol/L (ref 98–111)
Creatinine, Ser: 0.74 mg/dL (ref 0.44–1.00)
GFR, Estimated: >60 mL/min (ref >60)
Glucose, Bld: 123 mg/dL — ABNORMAL HIGH (ref 70–99)
Potassium: 2.9 mmol/L — ABNORMAL LOW (ref 3.5–5.1)
Sodium: 134 mmol/L — ABNORMAL LOW (ref 135–145)
Total Bilirubin: 0.9 mg/dL (ref 0.3–1.2)
Total Protein: 7.7 g/dL (ref 6.5–8.1)

## 2022-12-23 MED ORDER — DICYCLOMINE HCL 20 MG PO TABS: 20.0000 mg | ORAL_TABLET | Freq: Two times a day (BID) | ORAL | 0 refills | Status: DC

## 2022-12-23 MED ORDER — ONDANSETRON 4 MG PO TBDP: 4.0000 mg | ORAL_TABLET | Freq: Three times a day (TID) | ORAL | 0 refills | Status: DC | PRN

## 2022-12-23 MED ORDER — SODIUM CHLORIDE 0.9 % IV SOLN
25.0000 mg | Freq: Four times a day (QID) | INTRAVENOUS | Status: DC | PRN
  Administered 2022-12-23: 25 mg via INTRAVENOUS
  Filled 2022-12-23: qty 1

## 2022-12-23 MED ORDER — IOHEXOL 300 MG/ML  SOLN
100.0000 mL | Freq: Once | INTRAMUSCULAR | Status: AC | PRN
  Administered 2022-12-23: 100 mL via INTRAVENOUS

## 2022-12-23 MED ORDER — PROMETHAZINE HCL 25 MG/ML IJ SOLN
INTRAMUSCULAR | Status: AC
  Filled 2022-12-23: qty 1

## 2022-12-23 MED ORDER — POTASSIUM CHLORIDE ER 20 MEQ PO TBCR: 20.0000 meq | EXTENDED_RELEASE_TABLET | Freq: Every day | ORAL | 0 refills | Status: DC

## 2022-12-23 MED ORDER — POTASSIUM CHLORIDE 10 MEQ/100ML IV SOLN
10.0000 meq | INTRAVENOUS | Status: DC
  Administered 2022-12-23: 10 meq via INTRAVENOUS
  Filled 2022-12-23: qty 100

## 2022-12-23 MED ORDER — POTASSIUM CHLORIDE CRYS ER 20 MEQ PO TBCR
40.0000 meq | EXTENDED_RELEASE_TABLET | Freq: Once | ORAL | Status: DC
  Filled 2022-12-23: qty 2

## 2022-12-23 MED ORDER — SODIUM CHLORIDE 0.9 % IV BOLUS
1000.0000 mL | Freq: Once | INTRAVENOUS | Status: AC
  Administered 2022-12-23: 1000 mL via INTRAVENOUS

## 2022-12-23 NOTE — ED Triage Notes (Signed)
C/O NVD x 2 days

## 2022-12-23 NOTE — Discharge Instructions (Signed)
I have written 3 prescriptions for you today.  The first is Zofran which will help with the nausea and vomiting.  The second is Bentyl which will help with the abdominal cramping and pain.  The third is the Klor-Con tablets which is the potassium.  Please take as prescribed.  I would stick with a brat diet which includes bananas, rice, applesauce, toast.  Slowly incorporate a normal diet as tolerated.  Follow-up with your primary care doctor for further evaluation.  You may return to the emergency department for any worsening symptoms.

## 2022-12-23 NOTE — ED Notes (Signed)
Pt ambulated to her room without any assistance or incident. No gait abnormality noted.

## 2022-12-23 NOTE — ED Triage Notes (Signed)
Pt returns for continued vomiting today; sts vomiting has worsened; phenergan and zofran not helping

## 2022-12-23 NOTE — ED Notes (Signed)
Pt called for a room. Per registration pt left

## 2022-12-23 NOTE — ED Provider Notes (Signed)
[facility] Provider Note   CSN: [id number] Arrival date & time: 12/23/22  0849     History Chief Complaint  Patient presents with   Nausea   Diarrhea   Emesis    Christine Trujillo is a 27 y.o. female patient with cannabinoid hyperemesis syndrome, GERD, anxiety, depression who presents to the emergency department for further evaluation of nausea, vomiting, diarrhea.  Patient was seen here in the emergency department yesterday for similar symptoms but left AGAINST MEDICAL ADVICE after becoming disgruntled with medical staff.  Patient was going to get a CT scan to further assess but left without was performed.  I reviewed the labs from yesterday and there was no clear electrolyte abnormalities but patient did have evidence of leukocytosis.  Stool culture was obtained but never ordered.  Patient has been having intractable nausea, vomiting, and diarrhea since she left yesterday prompting her return today.   Diarrhea Associated symptoms: vomiting   Emesis Associated symptoms: diarrhea        Home Medications Prior to Admission medications   Medication Sig Start Date End Date Taking? Authorizing Provider  dicyclomine (BENTYL) 20 MG tablet Take 1 tablet (20 mg total) by mouth 2 (two) times daily. 12/23/22  Yes Meredeth Ide, Leonor Darnell M, PA-C  ondansetron (ZOFRAN-ODT) 4 MG disintegrating tablet Take 1 tablet (4 mg total) by mouth every 8 (eight) hours as needed for nausea or vomiting. 12/23/22  Yes Honor Loh M, PA-C  potassium chloride 20 MEQ TBCR Take 1 tablet (20 mEq total) by mouth daily. 12/23/22  Yes Meredeth Ide, Lataya Varnell M, PA-C  Biotin w/ Vitamins C & E (HAIR/SKIN/NAILS PO) Take 1 tablet by mouth daily.    [provider]  DULoxetine (CYMBALTA) 20 MG capsule Take 1 capsule (20 mg total) by mouth daily. 12/21/22   Sharlene Dory, DO  Multiple Vitamins-Minerals (MULTIVITAMIN WITH MINERALS) tablet Take 1 tablet by mouth daily.     [provider]  potassium chloride SA (KLOR-CON M) 20 MEQ tablet Take 2 tablets (40 mEq total) by mouth 2 (two) times daily. 04/14/22   Sharlene Dory, DO  promethazine (PHENERGAN) 25 MG suppository Place 1 suppository (25 mg total) rectally every 6 (six) hours as needed for nausea or vomiting. 11/12/22   Peter Garter, PA      Allergies    Patient has no known allergies.    Review of Systems   Review of Systems  Gastrointestinal:  Positive for diarrhea and vomiting.  All other systems reviewed and are negative.   Physical Exam Updated Vital Signs BP (!) 161/72   Pulse 60   Temp 98.9 F (37.2 C) (Oral)   Resp 16   Ht 5\' 3"  (1.6 m)   Wt 113.4 kg   SpO2 100%   BMI 44.29 kg/m  Physical Exam Vitals and nursing note reviewed.  Constitutional:      General: She is not in acute distress.    Appearance: Normal appearance.  HENT:     Head: Normocephalic and atraumatic.  Eyes:     General:        Right eye: No discharge.        Left eye: No discharge.  Cardiovascular:     Comments: Regular rate and rhythm.  S1/S2 are distinct without any evidence of murmur, rubs, or gallops.  Radial pulses are 2+ bilaterally.  Dorsalis pedis pulses are 2+ bilaterally.  No evidence of pedal edema. Pulmonary:     Comments: Clear  to auscultation bilaterally.  Normal effort.  No respiratory distress.  No evidence of wheezes, rales, or rhonchi heard throughout. Abdominal:     General: Abdomen is flat. Bowel sounds are normal. There is no distension.     Tenderness: There is generalized abdominal tenderness. There is no guarding or rebound.  Musculoskeletal:        General: Normal range of motion.     Cervical back: Neck supple.  Skin:    General: Skin is warm and dry.     Findings: No rash.  Neurological:     General: No focal deficit present.     Mental Status: She is alert.  Psychiatric:        Mood and Affect: Mood normal.        Behavior: Behavior normal.     ED  Results / Procedures / Treatments   Labs (all labs ordered are listed, but only abnormal results are displayed) Labs Reviewed  CBC WITH DIFFERENTIAL/PLATELET - Abnormal; Notable for the following components:      Result Value   WBC 18.6 (*)    Neutro Abs 14.7 (*)    Monocytes Absolute 1.7 (*)    Abs Immature Granulocytes 0.08 (*)    All other components within normal limits  COMPREHENSIVE METABOLIC PANEL - Abnormal; Notable for the following components:   Sodium 134 (*)    Potassium 2.9 (*)    Glucose, Bld 123 (*)    All other components within normal limits  STOOL CULTURE    EKG None  Radiology CT ABDOMEN PELVIS W CONTRAST  Result Date: 12/23/2022 CLINICAL DATA:  Nausea and vomiting and diarrhea for 2 days. EXAM: CT ABDOMEN AND PELVIS WITH CONTRAST TECHNIQUE: Multidetector CT imaging of the abdomen and pelvis was performed using the standard protocol following bolus administration of intravenous contrast. RADIATION DOSE REDUCTION: This exam was performed according to the departmental dose-optimization program which includes automated exposure control, adjustment of the mA and/or kV according to patient size and/or use of iterative reconstruction technique. CONTRAST:  OMNIPAQUE IOHEXOL 300 MG/ML  SOLN COMPARISON:  CT 11/14/2022 and older FINDINGS: Lower chest: Lung bases are clear. No pleural effusion. Minimal basilar atelectasis on the left. Hepatobiliary: Mild fatty liver infiltration. No space-occupying liver lesion. Previous cholecystectomy. Patent portal vein. Pancreas: Unremarkable. No pancreatic ductal dilatation or surrounding inflammatory changes. Spleen: Normal in size without focal abnormality. Adrenals/Urinary Tract: Adrenal glands are unremarkable. Kidneys are normal, without renal calculi, focal lesion, or hydronephrosis. Bladder is unremarkable. Stomach/Bowel: Stomach is within normal limits. Appendix appears normal. No evidence of bowel wall thickening, distention, or  inflammatory changes. Vascular/Lymphatic: No significant vascular findings are present. No enlarged abdominal or pelvic lymph nodes. Reproductive: Uterus and bilateral adnexa are unremarkable. Other: No abdominal wall hernia or abnormality. No abdominopelvic ascites. Musculoskeletal: No acute or significant osseous findings. IMPRESSION: No bowel obstruction, free air or free fluid. Normal appendix. Mild fatty liver infiltration. Previous cholecystectomy Electronically Signed   By: Karen Kays M.D.   On: 12/23/2022 12:26    Procedures Procedures    Medications Ordered in ED Medications  promethazine (PHENERGAN) 25 mg in sodium chloride 0.9 % 50 mL IVPB (0 mg Intravenous Stopped 12/23/22 1259)  potassium chloride 10 mEq in 100 mL IVPB (0 mEq Intravenous Stopped 12/23/22 1407)  potassium chloride SA (KLOR-CON M) CR tablet 40 mEq (40 mEq Oral Not Given 12/23/22 1407)  sodium chloride 0.9 % bolus 1,000 mL (0 mLs Intravenous Stopped 12/23/22 1407)  promethazine (PHENERGAN) 25  MG/ML injection (  Return to [facility] 12/23/22 1040)  iohexol (OMNIPAQUE) 300 MG/ML solution 100 mL (100 mLs Intravenous Contrast Given 12/23/22 1123)    ED Course/ Medical Decision Making/ A&P Clinical Course as of 12/23/22 1415  Thu Dec 23, 2022  1409 On reevaluation, patient states she is feeling better from a nausea and vomiting perspective.  She still is having some abdominal pain although it is better.  Patient willing to go home.  Patient only had 1 run of the potassium here.  She refused the Klor-Con p.o.  Patient would like a prescription for that to go home with.  And this is reasonable.  She did tolerate p.o. fluids here.  She is safe for discharge at this time. [CF]    Clinical Course User Index [CF] Teressa Lower, PA-C   {   Click here for ABCD2, HEART and other calculators  Medical Decision Making Jonai Ristine is a 27 y.o. female patient who presents to the emergency department today for further  evaluation of nausea, vomiting, and diarrhea.  Patient left AGAINST MEDICAL ADVICE yesterday.  Patient states she is willing to stay today.  I will order the CT scan that was was performed yesterday and get some basic labs in addition to the stool culture.  I will plan to give her some fluids and Phenergan in the meantime.  Patient is otherwise in no acute distress but does appear uncomfortable.  Patient found to be hypokalemic.  I given her 1 round of IV potassium here.  Patient does not wish to stay for the second.  Patient also declined p.o. potassium while she was here.  I will write her prescription for potassium to go home with in addition to Bentyl and Zofran.  This is likely viral gastroenteritis and/or cannabinoid induced hyperemesis syndrome.  She is safe for discharge at this time.  Amount and/or Complexity of Data Reviewed Labs: ordered. Radiology: ordered.  Risk Prescription drug management.    Final Clinical Impression(s) / ED Diagnoses Final diagnoses:  Generalized abdominal pain  Nausea and vomiting, unspecified vomiting type    Rx / DC Orders ED Discharge Orders          Ordered    dicyclomine (BENTYL) 20 MG tablet  2 times daily        12/23/22 1411    ondansetron (ZOFRAN-ODT) 4 MG disintegrating tablet  Every 8 hours PRN        12/23/22 1411    potassium chloride 20 MEQ TBCR  Daily        12/23/22 1411              Honor Loh [place], [place] 12/23/22 1415    Arby Barrette, MD 12/24/22 1717

## 2022-12-24 ENCOUNTER — Ambulatory Visit: Payer: MEDICAID | Admitting: Family Medicine

## 2022-12-27 ENCOUNTER — Ambulatory Visit: Payer: MEDICAID | Admitting: Obstetrics and Gynecology

## 2022-12-27 ENCOUNTER — Telehealth: Payer: Self-pay | Admitting: Family Medicine

## 2022-12-27 ENCOUNTER — Other Ambulatory Visit: Payer: Self-pay | Admitting: Family Medicine

## 2022-12-27 MED ORDER — VARENICLINE TARTRATE (STARTER) 0.5 MG X 11 & 1 MG X 42 PO TBPK: ORAL_TABLET | ORAL | 0 refills | Status: DC

## 2022-12-27 NOTE — Telephone Encounter (Signed)
Pt called & stated that Dr. Carmelia Roller gave her patches to help reduce her cigarette smoking. However, pt mentioned that the patches will not stay on her. She would like to receive an oral medication instead. Please call and advise pt.

## 2023-01-11 ENCOUNTER — Telehealth: Payer: Self-pay | Admitting: Family Medicine

## 2023-01-11 NOTE — Telephone Encounter (Signed)
Patient informed of PCP response to request.

## 2023-01-11 NOTE — Telephone Encounter (Signed)
Pt called to request a Rx to be put in for a boot for her broken ankle. Pt stated that Dr. Carmelia Roller was aware of this issue. Unsure if pt needs referral to ortho potentially or if Rx can be written by PCP. Please advise.

## 2023-01-27 ENCOUNTER — Emergency Department (HOSPITAL_BASED_OUTPATIENT_CLINIC_OR_DEPARTMENT_OTHER)
Admission: EM | Admit: 2023-01-27 | Discharge: 2023-01-27 | Disposition: A | Discharge status: Home / Self Care | Payer: MEDICAID

## 2023-01-27 ENCOUNTER — Encounter (HOSPITAL_BASED_OUTPATIENT_CLINIC_OR_DEPARTMENT_OTHER): Payer: Self-pay | Admitting: Urology

## 2023-01-27 DIAGNOSIS — R112 Nausea with vomiting, unspecified: Secondary | ICD-10-CM | POA: Diagnosis not present

## 2023-01-27 DIAGNOSIS — R1110 Vomiting, unspecified: Secondary | ICD-10-CM | POA: Diagnosis present

## 2023-01-27 DIAGNOSIS — R1084 Generalized abdominal pain: Secondary | ICD-10-CM | POA: Insufficient documentation

## 2023-01-27 DIAGNOSIS — D72829 Elevated white blood cell count, unspecified: Secondary | ICD-10-CM | POA: Insufficient documentation

## 2023-01-27 DIAGNOSIS — R197 Diarrhea, unspecified: Secondary | ICD-10-CM | POA: Insufficient documentation

## 2023-01-27 LAB — CBC WITH DIFFERENTIAL/PLATELET
Abs Immature Granulocytes: 0.04 10*3/uL (ref 0.00–0.07)
Basophils Absolute: 0 10*3/uL (ref 0.0–0.1)
Basophils Relative: 0 %
Eosinophils Absolute: 0 10*3/uL (ref 0.0–0.5)
Eosinophils Relative: 0 %
HCT: 41 % (ref 36.0–46.0)
Hemoglobin: 13.6 g/dL (ref 12.0–15.0)
Immature Granulocytes: 0 %
Lymphocytes Relative: 14 %
Lymphs Abs: 2 10*3/uL (ref 0.7–4.0)
MCH: 27.9 pg (ref 26.0–34.0)
MCHC: 33.2 g/dL (ref 30.0–36.0)
MCV: 84 fL (ref 80.0–100.0)
Monocytes Absolute: 1 10*3/uL (ref 0.1–1.0)
Monocytes Relative: 7 %
Neutro Abs: 11.3 10*3/uL — ABNORMAL HIGH (ref 1.7–7.7)
Neutrophils Relative %: 79 %
Platelets: 349 10*3/uL (ref 150–400)
RBC: 4.88 MIL/uL (ref 3.87–5.11)
RDW: 13.4 % (ref 11.5–15.5)
WBC: 14.4 10*3/uL — ABNORMAL HIGH (ref 4.0–10.5)
nRBC: 0 % (ref 0.0–0.2)

## 2023-01-27 LAB — COMPREHENSIVE METABOLIC PANEL
ALT: 9 U/L (ref 0–44)
AST: 12 U/L — ABNORMAL LOW (ref 15–41)
Albumin: 4.2 g/dL (ref 3.5–5.0)
Alkaline Phosphatase: 76 U/L (ref 38–126)
Anion gap: 14 (ref 5–15)
BUN: 6 mg/dL (ref 6–20)
CO2: 20 mmol/L — ABNORMAL LOW (ref 22–32)
Calcium: 9.5 mg/dL (ref 8.9–10.3)
Chloride: 104 mmol/L (ref 98–111)
Creatinine, Ser: 0.91 mg/dL (ref 0.44–1.00)
GFR, Estimated: >60 mL/min (ref >60)
Glucose, Bld: 141 mg/dL — ABNORMAL HIGH (ref 70–99)
Potassium: 3.3 mmol/L — ABNORMAL LOW (ref 3.5–5.1)
Sodium: 138 mmol/L (ref 135–145)
Total Bilirubin: 0.9 mg/dL (ref 0.3–1.2)
Total Protein: 7.2 g/dL (ref 6.5–8.1)

## 2023-01-27 LAB — HCG, SERUM, QUALITATIVE: Preg, Serum: NEGATIVE

## 2023-01-27 LAB — LIPASE, BLOOD: Lipase: 22 U/L (ref 11–51)

## 2023-01-27 MED ORDER — PROMETHAZINE HCL 25 MG PO TABS: 25.0000 mg | ORAL_TABLET | Freq: Four times a day (QID) | ORAL | 0 refills | Status: DC | PRN

## 2023-01-27 MED ORDER — PROMETHAZINE HCL 25 MG RE SUPP: 25.0000 mg | Freq: Four times a day (QID) | RECTAL | 0 refills | Status: DC | PRN

## 2023-01-27 MED ORDER — DROPERIDOL 2.5 MG/ML IJ SOLN
2.5000 mg | Freq: Once | INTRAMUSCULAR | Status: AC
  Administered 2023-01-27: 2.5 mg via INTRAVENOUS

## 2023-01-27 MED ORDER — SODIUM CHLORIDE 0.9 % IV BOLUS
1000.0000 mL | Freq: Once | INTRAVENOUS | Status: AC
  Administered 2023-01-27: 1000 mL via INTRAVENOUS

## 2023-01-27 NOTE — ED Notes (Signed)
Discharge paperwork reviewed entirely with patient, including follow up care. Pain was under control. The patient received instruction and coaching on their prescriptions, and all follow-up questions were answered.  Pt verbalized understanding as well as all parties involved. No questions or concerns voiced at the time of discharge. No acute distress noted.   Pt ambulated out to PVA without incident or assistance.  Pt advised they will notify their PCP.

## 2023-01-27 NOTE — ED Provider Notes (Signed)
[facility] Provider Note   CSN: [id number] Arrival date & time: 01/27/23  1130     History  Chief Complaint  Patient presents with   Emesis    Christine Trujillo is a 27 y.o. female.  Patient with history of cannabinoid hyperemesis, cholecystectomy -- presents to the emergency department for evaluation of vomiting.  She states that her symptoms started yesterday after eating food from a restaurant.  She has associated diarrhea.  Family member at bedside reports that his self and another family member have had nausea, some vomiting, and diarrhea after eating this yesterday.  Patient reports generalized abdominal pain.  No urinary symptoms.      Home Medications Prior to Admission medications   Medication Sig Start Date End Date Taking? Authorizing Provider  Biotin w/ Vitamins C & E (HAIR/SKIN/NAILS PO) Take 1 tablet by mouth daily.    [provider]  dicyclomine (BENTYL) 20 MG tablet Take 1 tablet (20 mg total) by mouth 2 (two) times daily. 12/23/22   Teressa Lower, PA-C  DULoxetine (CYMBALTA) 20 MG capsule Take 1 capsule (20 mg total) by mouth daily. 12/21/22   Sharlene Dory, DO  Multiple Vitamins-Minerals (MULTIVITAMIN WITH MINERALS) tablet Take 1 tablet by mouth daily.    [provider]  ondansetron (ZOFRAN-ODT) 4 MG disintegrating tablet Take 1 tablet (4 mg total) by mouth every 8 (eight) hours as needed for nausea or vomiting. 12/23/22   Honor Loh M, PA-C  potassium chloride 20 MEQ TBCR Take 1 tablet (20 mEq total) by mouth daily. 12/23/22   Honor Loh M, PA-C  potassium chloride SA (KLOR-CON M) 20 MEQ tablet Take 2 tablets (40 mEq total) by mouth 2 (two) times daily. 04/14/22   Sharlene Dory, DO  promethazine (PHENERGAN) 25 MG suppository Place 1 suppository (25 mg total) rectally every 6 (six) hours as needed for nausea or vomiting. 11/12/22   Peter Garter, PA  Varenicline  Tartrate, Starter, (CHANTIX STARTING MONTH PAK) 0.5 MG X 11 & 1 MG X 42 TBPK Follow instructions on package. 12/27/22   Sharlene Dory, DO      Allergies    Patient has no known allergies.    Review of Systems   Review of Systems  Physical Exam Updated Vital Signs BP (!) 129/99 (BP Location: Right Arm)   Pulse 67   Temp 98 F (36.7 C) (Oral)   Resp (!) 22   Ht 5\' 3"  (1.6 m)   Wt 113.4 kg   SpO2 100%   BMI 44.29 kg/m   Physical Exam Vitals and nursing note reviewed.  Constitutional:      General: She is not in acute distress.    Appearance: She is well-developed.     Comments: Patient actively dry heaving in the room during history and physical exam.  She had diarrhea on arrival.  HENT:     Head: Normocephalic and atraumatic.     Right Ear: External ear normal.     Left Ear: External ear normal.     Nose: Nose normal.  Eyes:     Conjunctiva/sclera: Conjunctivae normal.  Cardiovascular:     Rate and Rhythm: Normal rate and regular rhythm.     Heart sounds: No murmur heard. Pulmonary:     Effort: No respiratory distress.     Breath sounds: No wheezing, rhonchi or rales.  Abdominal:     Palpations: Abdomen is soft.     Tenderness: There  is abdominal tenderness. There is no guarding or rebound.     Comments: Patient reports generalized abdominal tenderness, but does not wince or react when I palpate gently in all 4 quadrants.  Musculoskeletal:     Cervical back: Normal range of motion and neck supple.     Right lower leg: No edema.     Left lower leg: No edema.  Skin:    General: Skin is warm and dry.     Findings: No rash.  Neurological:     General: No focal deficit present.     Mental Status: She is alert. Mental status is at baseline.     Motor: No weakness.  Psychiatric:        Mood and Affect: Mood normal.    ED Results / Procedures / Treatments   Labs (all labs ordered are listed, but only abnormal results are displayed) Labs Reviewed  CBC  WITH DIFFERENTIAL/PLATELET - Abnormal; Notable for the following components:      Result Value   WBC 14.4 (*)    Neutro Abs 11.3 (*)    All other components within normal limits  COMPREHENSIVE METABOLIC PANEL - Abnormal; Notable for the following components:   Potassium 3.3 (*)    CO2 20 (*)    Glucose, Bld 141 (*)    AST 12 (*)    All other components within normal limits  LIPASE, BLOOD  HCG, SERUM, QUALITATIVE  PREGNANCY, URINE  URINALYSIS, ROUTINE W REFLEX MICROSCOPIC    EKG EKG Interpretation Date/Time:  Thursday January 27 2023 12:21:53 EDT Ventricular Rate:  63 PR Interval:  124 QRS Duration:  104 QT Interval:  468 QTC Calculation: 480 R Axis:   85  Text Interpretation: Sinus rhythm Borderline prolonged QT interval Confirmed by Beckey Downing [phone]) on 01/27/2023 2:04:18 PM  Radiology No results found.  Procedures Procedures    Medications Ordered in ED Medications  droperidol (INAPSINE) 2.5 MG/ML injection 2.5 mg (2.5 mg Intravenous Given 01/27/23 1150)  sodium chloride 0.9 % bolus 1,000 mL (0 mLs Intravenous Stopped 01/27/23 1257)    ED Course/ Medical Decision Making/ A&P    Patient seen and examined. History obtained directly from patient.   Labs/EKG: Ordered CBC, CMP, lipase, UA, pregnancy.  EKG for QT check.  Imaging: None ordered  Medications/Fluids: Ordered: Droperidol 2.5 mg.   Most recent vital signs reviewed and are as follows: BP (!) 129/99 (BP Location: Right Arm)   Pulse 67   Temp 98 F (36.7 C) (Oral)   Resp (!) 22   Ht 5\' 3"  (1.6 m)   Wt 113.4 kg   SpO2 100%   BMI 44.29 kg/m   Initial impression: Vomiting and diarrhea, history of cyclic vomiting.  2:02 PM Reassessment performed. Patient appears improved.  No further vomiting.  States that nausea is better although she does not feel great.  She states that she cannot urinate because she only has diarrhea currently.  Labs personally reviewed and interpreted including: CBC with  elevated white blood cell count at 14.4, however this seems to be patient's baseline; CMP potassium slightly low at 3.3, glucose 144, otherwise unremarkable; lipase normal at 22.  Added serum pregnancy.  Reviewed pertinent lab work and imaging with patient at bedside. Questions answered.   Most current vital signs reviewed and are as follows: BP (!) 129/99 (BP Location: Right Arm)   Pulse 67   Temp 98 F (36.7 C) (Oral)   Resp (!) 22   Ht 5\' 3"  (1.6  m)   Wt 113.4 kg   SpO2 100%   BMI 44.29 kg/m   Plan: Discharge to home.  Patient is requesting discharge to home.  Prescriptions written for: Phenergan  Other home care instructions discussed: Clear liquid diet for 12 to 24 hours and then slow advancement to brat diet.  ED return instructions discussed: The patient was urged to return to the Emergency Department immediately with worsening of current symptoms, worsening abdominal pain, persistent vomiting, blood noted in stools, fever, or any other concerns. The patient verbalized understanding.   Follow-up instructions discussed: Patient encouraged to follow-up with their PCP in 3 days.                                   Medical Decision Making Amount and/or Complexity of Data Reviewed Labs: ordered.  Risk Prescription drug management.   For this patient's complaint of abdominal pain, the following conditions were considered on the differential diagnosis: gastritis/PUD, enteritis/duodenitis, appendicitis, cholelithiasis/cholecystitis, cholangitis, pancreatitis, ruptured viscus, colitis, diverticulitis, small/large bowel obstruction, proctitis, cystitis, pyelonephritis, ureteral colic, aortic dissection, aortic aneurysm. In women, ectopic pregnancy, pelvic inflammatory disease, ovarian cysts, and tubo-ovarian abscess were also considered. Atypical chest etiologies were also considered including ACS, PE, and pneumonia.  Possible recurrent cyclic vomiting versus foodborne illness.   Patient responded well to antiemetics and is now tolerating oral fluids.  She is requesting discharge.  No focal abdominal pain.  Do not currently feel that she requires imaging  The patient's vital signs, pertinent lab work and imaging were reviewed and interpreted as discussed in the ED course. Hospitalization was considered for further testing, treatments, or serial exams/observation. However as patient is well-appearing, has a stable exam, and reassuring studies today, I do not feel that they warrant admission at this time. This plan was discussed with the patient who verbalizes agreement and comfort with this plan and seems reliable and able to return to the Emergency Department with worsening or changing symptoms.          Final Clinical Impression(s) / ED Diagnoses Final diagnoses:  Nausea vomiting and diarrhea    Rx / DC Orders ED Discharge Orders          Ordered    promethazine (PHENERGAN) 25 MG tablet  Every 6 hours PRN        01/27/23 1359    promethazine (PHENERGAN) 25 MG suppository  Every 6 hours PRN        01/27/23 1359              Renne Crigler, PA-C 01/27/23 1646    Durwin Glaze, MD 01/27/23 1819

## 2023-01-27 NOTE — Discharge Instructions (Signed)
Please read and follow all provided instructions.  Your diagnoses today include:  1. Nausea vomiting and diarrhea     TTests performed today include: Blood cell counts and platelets Kidney and liver function tests Pancreas function test (called lipase) Blood test for pregnancy Vital signs. See below for your results today.   Medications prescribed:   Take any prescribed medications only as directed.  Home care instructions:  Follow any educational materials contained in this packet.  Your abdominal pain, nausea, vomiting, and diarrhea may be caused by a viral gastroenteritis also called 'stomach flu'. You should rest for the next several days. Keep drinking plenty of fluids and use the medicine for nausea as directed.   Drink clear liquids for the next 24 hours and introduce solid foods slowly after 24 hours using the b.r.a.t. diet (Bananas, Rice, Applesauce, Toast, Yogurt).    Follow-up instructions: Please follow-up with your primary care provider in the next 2 days for further evaluation of your symptoms. If you are not feeling better in 48 hours you may have a condition that is more serious and you need re-evaluation.   Return instructions:  SEEK IMMEDIATE MEDICAL ATTENTION IF: If you have pain that does not go away or becomes severe  A temperature above 101F develops  Repeated vomiting occurs (multiple episodes)  If you have pain that becomes localized to portions of the abdomen. The right side could possibly be appendicitis. In an adult, the left lower portion of the abdomen could be colitis or diverticulitis.  Blood is being passed in stools or vomit (bright red or black tarry stools)  You develop chest pain, difficulty breathing, dizziness or fainting, or become confused, poorly responsive, or inconsolable (young children) If you have any other emergent concerns regarding your health  Additional Information: Abdominal (belly) pain can be caused by many things. Your  caregiver performed an examination and possibly ordered blood/urine tests and imaging (CT scan, x-rays, ultrasound). Many cases can be observed and treated at home after initial evaluation in the emergency department. Even though you are being discharged home, abdominal pain can be unpredictable. Therefore, you need a repeated exam if your pain does not resolve, returns, or worsens. Most patients with abdominal pain don't have to be admitted to the hospital or have surgery, but serious problems like appendicitis and gallbladder attacks can start out as nonspecific pain. Many abdominal conditions cannot be diagnosed in one visit, so follow-up evaluations are very important.  Your vital signs today were: BP (!) 129/99 (BP Location: Right Arm)   Pulse 67   Temp 98 F (36.7 C) (Oral)   Resp (!) 22   Ht 5\' 3"  (1.6 m)   Wt 113.4 kg   SpO2 100%   BMI 44.29 kg/m  If your blood pressure (bp) was elevated above 135/85 this visit, please have this repeated by your doctor within one month. --------------

## 2023-01-27 NOTE — ED Triage Notes (Signed)
N/V that started yesterday after eating [employer], others in family have same after eating the same

## 2023-01-28 ENCOUNTER — Encounter (HOSPITAL_BASED_OUTPATIENT_CLINIC_OR_DEPARTMENT_OTHER): Payer: Self-pay

## 2023-01-28 ENCOUNTER — Observation Stay (HOSPITAL_BASED_OUTPATIENT_CLINIC_OR_DEPARTMENT_OTHER)
Admission: EM | Admit: 2023-01-28 | Discharge: 2023-01-30 | Disposition: A | Discharge status: Home / Self Care | Payer: MEDICAID | Attending: Emergency Medicine | Admitting: Emergency Medicine

## 2023-01-28 ENCOUNTER — Emergency Department (HOSPITAL_BASED_OUTPATIENT_CLINIC_OR_DEPARTMENT_OTHER): Payer: MEDICAID

## 2023-01-28 ENCOUNTER — Other Ambulatory Visit: Payer: Self-pay

## 2023-01-28 DIAGNOSIS — F17210 Nicotine dependence, cigarettes, uncomplicated: Secondary | ICD-10-CM | POA: Insufficient documentation

## 2023-01-28 DIAGNOSIS — E876 Hypokalemia: Secondary | ICD-10-CM | POA: Diagnosis not present

## 2023-01-28 DIAGNOSIS — F411 Generalized anxiety disorder: Secondary | ICD-10-CM | POA: Diagnosis present

## 2023-01-28 DIAGNOSIS — R112 Nausea with vomiting, unspecified: Principal | ICD-10-CM | POA: Diagnosis present

## 2023-01-28 DIAGNOSIS — F339 Major depressive disorder, recurrent, unspecified: Secondary | ICD-10-CM | POA: Diagnosis present

## 2023-01-28 LAB — URINALYSIS, ROUTINE W REFLEX MICROSCOPIC
Bilirubin Urine: NEGATIVE
Glucose, UA: NEGATIVE mg/dL
Ketones, ur: NEGATIVE mg/dL
Leukocytes,Ua: NEGATIVE
Nitrite: NEGATIVE
Protein, ur: NEGATIVE mg/dL
Specific Gravity, Urine: 1.015 (ref 1.005–1.030)
pH: 7 (ref 5.0–8.0)

## 2023-01-28 LAB — CBC WITH DIFFERENTIAL/PLATELET
Abs Immature Granulocytes: 0.07 10*3/uL (ref 0.00–0.07)
Basophils Absolute: 0 10*3/uL (ref 0.0–0.1)
Basophils Relative: 0 %
Eosinophils Absolute: 0 10*3/uL (ref 0.0–0.5)
Eosinophils Relative: 0 %
HCT: 39.1 % (ref 36.0–46.0)
Hemoglobin: 13.2 g/dL (ref 12.0–15.0)
Immature Granulocytes: 0 %
Lymphocytes Relative: 10 %
Lymphs Abs: 1.8 10*3/uL (ref 0.7–4.0)
MCH: 27.8 pg (ref 26.0–34.0)
MCHC: 33.8 g/dL (ref 30.0–36.0)
MCV: 82.5 fL (ref 80.0–100.0)
Monocytes Absolute: 1.9 10*3/uL — ABNORMAL HIGH (ref 0.1–1.0)
Monocytes Relative: 10 %
Neutro Abs: 14.3 10*3/uL — ABNORMAL HIGH (ref 1.7–7.7)
Neutrophils Relative %: 80 %
Platelets: 330 10*3/uL (ref 150–400)
RBC: 4.74 MIL/uL (ref 3.87–5.11)
RDW: 13.4 % (ref 11.5–15.5)
WBC: 18.1 10*3/uL — ABNORMAL HIGH (ref 4.0–10.5)
nRBC: 0 % (ref 0.0–0.2)

## 2023-01-28 LAB — COMPREHENSIVE METABOLIC PANEL
ALT: 15 U/L (ref 0–44)
AST: 18 U/L (ref 15–41)
Albumin: 4.2 g/dL (ref 3.5–5.0)
Alkaline Phosphatase: 72 U/L (ref 38–126)
Anion gap: 13 (ref 5–15)
BUN: 9 mg/dL (ref 6–20)
CO2: 22 mmol/L (ref 22–32)
Calcium: 9.1 mg/dL (ref 8.9–10.3)
Chloride: 98 mmol/L (ref 98–111)
Creatinine, Ser: 0.87 mg/dL (ref 0.44–1.00)
GFR, Estimated: >60 mL/min (ref >60)
Glucose, Bld: 106 mg/dL — ABNORMAL HIGH (ref 70–99)
Potassium: 2.7 mmol/L — CL (ref 3.5–5.1)
Sodium: 133 mmol/L — ABNORMAL LOW (ref 135–145)
Total Bilirubin: 1.5 mg/dL — ABNORMAL HIGH (ref 0.3–1.2)
Total Protein: 7.6 g/dL (ref 6.5–8.1)

## 2023-01-28 LAB — URINALYSIS, MICROSCOPIC (REFLEX)

## 2023-01-28 LAB — LIPASE, BLOOD: Lipase: 22 U/L (ref 11–51)

## 2023-01-28 LAB — PREGNANCY, URINE: Preg Test, Ur: NEGATIVE

## 2023-01-28 MED ORDER — ONDANSETRON HCL 4 MG/2ML IJ SOLN
4.0000 mg | Freq: Four times a day (QID) | INTRAMUSCULAR | Status: DC | PRN
  Administered 2023-01-28 – 2023-01-30 (×4): 4 mg via INTRAVENOUS
  Filled 2023-01-28 (×4): qty 2

## 2023-01-28 MED ORDER — DROPERIDOL 2.5 MG/ML IJ SOLN
2.5000 mg | Freq: Once | INTRAMUSCULAR | Status: AC
  Administered 2023-01-28: 2.5 mg via INTRAVENOUS
  Filled 2023-01-28: qty 2

## 2023-01-28 MED ORDER — SODIUM CHLORIDE 0.9 % IV SOLN: INTRAVENOUS | Status: AC | PRN

## 2023-01-28 MED ORDER — PROCHLORPERAZINE EDISYLATE 10 MG/2ML IJ SOLN
10.0000 mg | Freq: Once | INTRAMUSCULAR | Status: AC
  Administered 2023-01-28: 10 mg via INTRAVENOUS
  Filled 2023-01-28: qty 2

## 2023-01-28 MED ORDER — ENOXAPARIN SODIUM 40 MG/0.4ML IJ SOSY
40.0000 mg | PREFILLED_SYRINGE | INTRAMUSCULAR | Status: DC
  Administered 2023-01-29 – 2023-01-30 (×2): 40 mg via SUBCUTANEOUS
  Filled 2023-01-28 (×2): qty 0.4

## 2023-01-28 MED ORDER — ACETAMINOPHEN 325 MG PO TABS: 650.0000 mg | ORAL_TABLET | Freq: Four times a day (QID) | ORAL | Status: DC | PRN

## 2023-01-28 MED ORDER — LACTATED RINGERS IV BOLUS
1000.0000 mL | Freq: Once | INTRAVENOUS | Status: AC
  Administered 2023-01-28: 1000 mL via INTRAVENOUS

## 2023-01-28 MED ORDER — HYDROMORPHONE HCL 1 MG/ML IJ SOLN
0.5000 mg | INTRAMUSCULAR | Status: DC | PRN
  Administered 2023-01-28 – 2023-01-29 (×4): 0.5 mg via INTRAVENOUS
  Filled 2023-01-28 (×5): qty 0.5

## 2023-01-28 MED ORDER — SODIUM CHLORIDE 0.9 % IV SOLN
12.5000 mg | Freq: Four times a day (QID) | INTRAVENOUS | Status: DC | PRN
  Administered 2023-01-29 – 2023-01-30 (×2): 12.5 mg via INTRAVENOUS
  Filled 2023-01-28: qty 12.5
  Filled 2023-01-28: qty 0.5
  Filled 2023-01-28: qty 12.5
  Filled 2023-01-28: qty 0.5

## 2023-01-28 MED ORDER — ACETAMINOPHEN 650 MG RE SUPP: 650.0000 mg | Freq: Four times a day (QID) | RECTAL | Status: DC | PRN

## 2023-01-28 MED ORDER — POTASSIUM CHLORIDE CRYS ER 20 MEQ PO TBCR: 40.0000 meq | EXTENDED_RELEASE_TABLET | Freq: Once | ORAL | Status: DC

## 2023-01-28 MED ORDER — POTASSIUM CHLORIDE 10 MEQ/100ML IV SOLN
10.0000 meq | INTRAVENOUS | Status: AC
  Administered 2023-01-28 (×3): 10 meq via INTRAVENOUS
  Filled 2023-01-28 (×3): qty 100

## 2023-01-28 MED ORDER — POTASSIUM CHLORIDE IN NACL 20-0.9 MEQ/L-% IV SOLN
INTRAVENOUS | Status: AC
  Filled 2023-01-28: qty 1000

## 2023-01-28 MED ORDER — IOHEXOL 300 MG/ML  SOLN
100.0000 mL | Freq: Once | INTRAMUSCULAR | Status: AC | PRN
  Administered 2023-01-28: 100 mL via INTRAVENOUS

## 2023-01-28 MED ORDER — PANTOPRAZOLE SODIUM 40 MG IV SOLR
40.0000 mg | INTRAVENOUS | Status: DC
  Administered 2023-01-28 – 2023-01-29 (×2): 40 mg via INTRAVENOUS
  Filled 2023-01-28 (×2): qty 10

## 2023-01-28 NOTE — Progress Notes (Signed)
Plan of Care Note for accepted transfer   Patient: Christine Trujillo MRN: [id number]   DOA: 01/28/2023  Facility requesting transfer: [facility]  Requesting Provider: Albesa Seen, PA  Reason for transfer: Intractable N/V   Facility course: 27 yr old female with h/o depression, anxiety, and cannabis hyperemesis syndrome who presents with 3 days of abdominal discomfort, nausea, vomiting, and diarrhea.  Household contacts have had similar symptoms.  Labs are most notable for potassium 2.7 and WBC 18,100.  There are no acute findings on CT of the abdomen and pelvis.  Urinalysis not suggestive of infection.  Patient was treated with IV fluids, IV potassium, Compazine, and droperidol in the ED.  Plan of care: The patient is accepted for admission to Telemetry unit, at [facility].   Author: Briscoe Deutscher, MD 01/28/2023  Check [email or web address] for on-call coverage.  Nursing staff, Please call [facility] Admits & Consults System-Wide number on Amion as soon as patient's arrival, so appropriate admitting provider can evaluate the pt.

## 2023-01-28 NOTE — ED Notes (Signed)
Pt provided apple juice and crackers, per her request.

## 2023-01-28 NOTE — H&P (Signed)
PCP:   Sharlene Dory, DO   Chief Complaint:  Nausea, vomiting, diarrhea  HPI: This is a 27 year-old female with past medical history of anxiety, depression, and cannabis hyperemesis syndrome.  On Wednesday patient developed nonstop nausea, vomiting and diarrhea.  She endorses some mild abdominal discomfort, as well as subjective fevers and chills.  She reports some generalized abdominal pain, denies burning urination.  Per patient this is a recurrent issue.  She smokes 3 blunts of marijuana daily however has not smoked since Wednesday.  She went [facility] ER.  In the ER patient treated with antiemetics and fluids.  Her symptoms are persistent.  She was accepted in transfer.  Review of Systems:  Per HPI  Past Medical History: Past Medical History:  Diagnosis Date   Anxiety    Cannabinoid hyperemesis syndrome    Depression    GERD (gastroesophageal reflux disease)    History of anemia    during pregnancy   History of gestational diabetes    Ovarian cyst    left   Pelvic pain    Upper respiratory symptom    symptoms started 02-16-2021 cough/ runny nose;  pcp visit 03-10-2021 with sore throat/ congestion/ sob/ body aches/ cough but no fever, negative covid/ flu/ strep resultsin epic  (03-13-2021  pt stated today only has nonproductive cough)   Past Surgical History:  Procedure Laterality Date   BIOPSY  10/22/2020   Procedure: BIOPSY;  Surgeon: Shellia Cleverly, DO;  Location: [facility];  Service: Gastroenterology;;   CHOLECYSTECTOMY N/A 12/02/2016   Procedure: LAPAROSCOPIC CHOLECYSTECTOMY WITH INTRAOPERATIVE CHOLANGIOGRAM;  Surgeon: Ovidio Kin, MD;  Location: [facility];  Service: General;  Laterality: N/A;   COLONOSCOPY WITH PROPOFOL N/A 10/22/2020   Procedure: COLONOSCOPY WITH PROPOFOL;  Surgeon: Shellia Cleverly, DO;  Location: [facility];  Service: Gastroenterology;  Laterality: N/A;   ESOPHAGOGASTRODUODENOSCOPY (EGD) WITH PROPOFOL N/A 10/22/2020   Procedure:  ESOPHAGOGASTRODUODENOSCOPY (EGD) WITH PROPOFOL;  Surgeon: Shellia Cleverly, DO;  Location: [facility];  Service: Gastroenterology;  Laterality: N/A;   ESOPHAGOGASTRODUODENOSCOPY (EGD) WITH PROPOFOL N/A 10/22/2020   Procedure: ESOPHAGOGASTRODUODENOSCOPY (EGD) WITH PROPOFOL;  Surgeon: Shellia Cleverly, DO;  Location: [facility];  Service: Gastroenterology;  Laterality: N/A;   LAPAROSCOPIC OVARIAN CYSTECTOMY Left 03/17/2021   Procedure: LAPAROSCOPIC Resection of Adnexal Mass;  Surgeon: Willodean Rosenthal, MD;  Location: [facility];  Service: Gynecology;  Laterality: Left;   LAPAROSCOPIC SALPINGO OOPHERECTOMY Bilateral 03/17/2021   Procedure: LAPAROSCOPIC BIALTERAL SALPINGECTOMY;  Surgeon: Willodean Rosenthal, MD;  Location: [facility];  Service: Gynecology;  Laterality: Bilateral;   TUBAL LIGATION     pt unsure of affected side (procedure on 03/17/21)   WISDOM TOOTH EXTRACTION      Medications: Prior to Admission medications   Medication Sig Start Date End Date Taking? Authorizing Provider  Biotin w/ Vitamins C & E (HAIR/SKIN/NAILS PO) Take 1 tablet by mouth daily.    [provider]  dicyclomine (BENTYL) 20 MG tablet Take 1 tablet (20 mg total) by mouth 2 (two) times daily. 12/23/22   Teressa Lower, PA-C  DULoxetine (CYMBALTA) 20 MG capsule Take 1 capsule (20 mg total) by mouth daily. 12/21/22   Sharlene Dory, DO  Multiple Vitamins-Minerals (MULTIVITAMIN WITH MINERALS) tablet Take 1 tablet by mouth daily.    [provider]  ondansetron (ZOFRAN-ODT) 4 MG disintegrating tablet Take 1 tablet (4 mg total) by mouth every 8 (eight) hours as needed for nausea or vomiting. 12/23/22   Meredeth Ide,  Conner M, PA-C  potassium chloride 20 MEQ TBCR Take 1 tablet (20 mEq total) by mouth daily. 12/23/22   Honor Loh M, PA-C  potassium chloride SA (KLOR-CON M) 20 MEQ tablet Take 2 tablets (40 mEq total) by mouth 2 (two) times daily.  04/14/22   Sharlene Dory, DO  promethazine (PHENERGAN) 25 MG suppository Place 1 suppository (25 mg total) rectally every 6 (six) hours as needed for nausea or vomiting. 01/27/23   Renne Crigler, PA-C  promethazine (PHENERGAN) 25 MG tablet Take 1 tablet (25 mg total) by mouth every 6 (six) hours as needed for nausea or vomiting. 01/27/23   Renne Crigler, PA-C  Varenicline Tartrate, Starter, (CHANTIX STARTING MONTH PAK) 0.5 MG X 11 & 1 MG X 42 TBPK Follow instructions on package. 12/27/22   Sharlene Dory, DO    Allergies:  No Known Allergies  Social History:  reports that she has been smoking cigarettes. She has a 2.5 pack-year smoking history. She has never used smokeless tobacco. She reports current drug use. Frequency: 3.00 times per week. Drug: Marijuana. She reports that she does not drink alcohol.  Family History: Family History  Problem Relation Age of Onset   Diabetes Mother    Heart disease Mother        CHF   Stomach cancer Brother    Colon cancer Neg Hx    Pancreatic cancer Neg Hx    Esophageal cancer Neg Hx    Liver disease Neg Hx     Physical Exam: Vitals:   01/28/23 1728 01/28/23 1930 01/28/23 2006 01/28/23 2100  BP:  (!) 181/93 (!) 145/98 (!) 147/88  Pulse:  (!) 58 73 70  Resp:  (!) 25 19 18   Temp: 98.3 F (36.8 C)  98.6 F (37 C) 98.8 F (37.1 C)  TempSrc: Oral  Oral Oral  SpO2:  98% 100% 99%  Weight:      Height:        General: A and O x 3, obese, no acute distress Eyes: Pink conjunctiva, no scleral icterus ENT: Moist oral mucosa, neck supple, no thyromegaly Lungs: CTA B/L, no wheeze, no crackles, no use of accessory muscles Cardiovascular: RRR, no regurgitation, no gallops, no murmurs.  Abdomen: soft, positive BS, NTND, not an acute abdomen GU: not examined Neuro: CN II - XII grossly intact, sensation intact Musculoskeletal: strength 5/5 all extremities, no edema Skin: no rash, no subcutaneous crepitation, no decubitus Psych:  appropriate patient  Labs on Admission:  Recent Labs    01/27/23 1146 01/28/23 1439  NA 138 133*  K 3.3* 2.7*  CL 104 98  CO2 20* 22  GLUCOSE 141* 106*  BUN 6 9  CREATININE 0.91 0.87  CALCIUM 9.5 9.1   Recent Labs    01/27/23 1146 01/28/23 1439  AST 12* 18  ALT 9 15  ALKPHOS 76 72  BILITOT 0.9 1.5*  PROT 7.2 7.6  ALBUMIN 4.2 4.2   Recent Labs    01/27/23 1146 01/28/23 1439  LIPASE 22 22   Recent Labs    01/27/23 1146 01/28/23 1439  WBC 14.4* 18.1*  NEUTROABS 11.3* 14.3*  HGB 13.6 13.2  HCT 41.0 39.1  MCV 84.0 82.5  PLT 349 330    Radiological Exams on Admission: CT ABDOMEN PELVIS W CONTRAST  Result Date: 01/28/2023 CLINICAL DATA:  Diarrhea pain and vomiting EXAM: CT ABDOMEN AND PELVIS WITH CONTRAST TECHNIQUE: Multidetector CT imaging of the abdomen and pelvis was performed using the standard protocol following  bolus administration of intravenous contrast. RADIATION DOSE REDUCTION: This exam was performed according to the departmental dose-optimization program which includes automated exposure control, adjustment of the mA and/or kV according to patient size and/or use of iterative reconstruction technique. CONTRAST:  OMNIPAQUE IOHEXOL 300 MG/ML  SOLN COMPARISON:  12/23/2022 FINDINGS: Lower chest: Lung bases are clear. Hepatobiliary: No focal liver abnormality is seen. Status post cholecystectomy. No biliary dilatation. Hepatic steatosis Pancreas: Unremarkable. No pancreatic ductal dilatation or surrounding inflammatory changes. Spleen: Normal in size without focal abnormality. Adrenals/Urinary Tract: Adrenal glands are unremarkable. Kidneys are normal, without renal calculi, focal lesion, or hydronephrosis. Bladder is unremarkable. Stomach/Bowel: Stomach is within normal limits. Appendix appears normal. No evidence of bowel wall thickening, distention, or inflammatory changes. Vascular/Lymphatic: No significant vascular findings are present. No enlarged  abdominal or pelvic lymph nodes. Reproductive: Uterus and bilateral adnexa are unremarkable. Other: No abdominal wall hernia or abnormality. No abdominopelvic ascites. Musculoskeletal: No acute or significant osseous findings. IMPRESSION: 1. No CT evidence for acute intra-abdominal or pelvic abnormality. 2. Hepatic steatosis. Status post cholecystectomy. Electronically Signed   By: Jasmine Pang M.D.   On: 01/28/2023 18:01    Assessment/Plan Present on Admission:  Intractable nausea and vomiting//  THC daily  -Zofran an dphenergan PRN -IVF hydration -CLD, advance as tolerated to regular   Hypokalemia  -Repleting IVF hydration 24hrs -BMP in a.m.  Tobacco abuse -Nicotine patch ordered   GAD (generalized anxiety disorder)  Vedanth Sirico 01/28/2023, 9:36 PM

## 2023-01-28 NOTE — ED Triage Notes (Signed)
The patient having ABD Pain and vomiting for two days. She was seen here yesterday for same.

## 2023-01-28 NOTE — ED Provider Notes (Signed)
[place] [facility] AT [facility] Provider Note   CSN: [id number] Arrival date & time: 01/28/23  1318     History  Chief Complaint  Patient presents with   Emesis   Abdominal Pain    Christine Trujillo is a 27 y.o. female with history of cannabinoid hyperemesis syndrome, GERD, who presents to the ER with concern for non bloody diarrhea and vomiting, and mild abdominal pain for the past 3 days.  States states she ate [employer] 3 days ago, and then developed the symptoms.  The rest of her family has also had the symptoms after eating [employer]. Denies any fever or chills.  Reports difficulty advancing to brat diet as instructed yesterday.  States she was unable to even tolerate ice chips today. Last marijuana use was 3 days ago.   Emesis Associated symptoms: abdominal pain   Abdominal Pain Associated symptoms: vomiting        Home Medications Prior to Admission medications   Medication Sig Start Date End Date Taking? Authorizing Provider  Biotin w/ Vitamins C & E (HAIR/SKIN/NAILS PO) Take 1 tablet by mouth daily.    [provider]  dicyclomine (BENTYL) 20 MG tablet Take 1 tablet (20 mg total) by mouth 2 (two) times daily. 12/23/22   Teressa Lower, PA-C  DULoxetine (CYMBALTA) 20 MG capsule Take 1 capsule (20 mg total) by mouth daily. 12/21/22   Sharlene Dory, DO  Multiple Vitamins-Minerals (MULTIVITAMIN WITH MINERALS) tablet Take 1 tablet by mouth daily.    [provider]  ondansetron (ZOFRAN-ODT) 4 MG disintegrating tablet Take 1 tablet (4 mg total) by mouth every 8 (eight) hours as needed for nausea or vomiting. 12/23/22   Honor Loh M, PA-C  potassium chloride 20 MEQ TBCR Take 1 tablet (20 mEq total) by mouth daily. 12/23/22   Honor Loh M, PA-C  potassium chloride SA (KLOR-CON M) 20 MEQ tablet Take 2 tablets (40 mEq total) by mouth 2 (two) times daily. 04/14/22   Sharlene Dory, DO  promethazine  (PHENERGAN) 25 MG suppository Place 1 suppository (25 mg total) rectally every 6 (six) hours as needed for nausea or vomiting. 01/27/23   Renne Crigler, PA-C  promethazine (PHENERGAN) 25 MG tablet Take 1 tablet (25 mg total) by mouth every 6 (six) hours as needed for nausea or vomiting. 01/27/23   Renne Crigler, PA-C  Varenicline Tartrate, Starter, (CHANTIX STARTING MONTH PAK) 0.5 MG X 11 & 1 MG X 42 TBPK Follow instructions on package. 12/27/22   Sharlene Dory, DO      Allergies    Patient has no known allergies.    Review of Systems   Review of Systems  Gastrointestinal:  Positive for abdominal pain and vomiting.    Physical Exam Updated Vital Signs BP (!) 181/93   Pulse (!) 58   Temp 98.3 F (36.8 C) (Oral)   Resp (!) 25   Ht 5\' 3"  (1.6 m)   Wt 113 kg   SpO2 98%   BMI 44.13 kg/m  Physical Exam Vitals and nursing note reviewed.  Constitutional:      General: She is not in acute distress.    Appearance: She is well-developed.     Comments: Well-appearing, no acute distress.  Not actively vomiting  HENT:     Head: Normocephalic and atraumatic.  Eyes:     Conjunctiva/sclera: Conjunctivae normal.  Cardiovascular:     Rate and Rhythm: Normal rate and regular rhythm.  Heart sounds: No murmur heard. Pulmonary:     Effort: Pulmonary effort is normal. No respiratory distress.     Breath sounds: Normal breath sounds.  Abdominal:     Palpations: Abdomen is soft.     Tenderness: There is no abdominal tenderness.     Comments: Patient reports tenderness to palpation when gently palpating over her abdomen, however, does not wince or react to palpation.  No guarding.  No CVA tenderness bilaterally  Musculoskeletal:        General: No swelling.     Cervical back: Neck supple.  Skin:    General: Skin is warm and dry.     Capillary Refill: Capillary refill takes less than 2 seconds.  Neurological:     Mental Status: She is alert.  Psychiatric:        Mood and  Affect: Mood normal.     ED Results / Procedures / Treatments   Labs (all labs ordered are listed, but only abnormal results are displayed) Labs Reviewed  CBC WITH DIFFERENTIAL/PLATELET - Abnormal; Notable for the following components:      Result Value   WBC 18.1 (*)    Neutro Abs 14.3 (*)    Monocytes Absolute 1.9 (*)    All other components within normal limits  COMPREHENSIVE METABOLIC PANEL - Abnormal; Notable for the following components:   Sodium 133 (*)    Potassium 2.7 (*)    Glucose, Bld 106 (*)    Total Bilirubin 1.5 (*)    All other components within normal limits  URINALYSIS, ROUTINE W REFLEX MICROSCOPIC - Abnormal; Notable for the following components:   Hgb urine dipstick TRACE (*)    All other components within normal limits  URINALYSIS, MICROSCOPIC (REFLEX) - Abnormal; Notable for the following components:   Bacteria, UA FEW (*)    All other components within normal limits  PREGNANCY, URINE  LIPASE, BLOOD    EKG EKG Interpretation Date/Time:  Friday January 28 2023 14:49:52 EDT Ventricular Rate:  53 PR Interval:  101 QRS Duration:  96 QT Interval:  458 QTC Calculation: 430 R Axis:   75  Text Interpretation: Sinus rhythm Atrial premature complex Short PR interval Confirmed by Anders Simmonds [phone]) on 01/28/2023 3:07:51 PM  Radiology CT ABDOMEN PELVIS W CONTRAST  Result Date: 01/28/2023 CLINICAL DATA:  Diarrhea pain and vomiting EXAM: CT ABDOMEN AND PELVIS WITH CONTRAST TECHNIQUE: Multidetector CT imaging of the abdomen and pelvis was performed using the standard protocol following bolus administration of intravenous contrast. RADIATION DOSE REDUCTION: This exam was performed according to the departmental dose-optimization program which includes automated exposure control, adjustment of the mA and/or kV according to patient size and/or use of iterative reconstruction technique. CONTRAST:  OMNIPAQUE IOHEXOL 300 MG/ML  SOLN COMPARISON:  12/23/2022  FINDINGS: Lower chest: Lung bases are clear. Hepatobiliary: No focal liver abnormality is seen. Status post cholecystectomy. No biliary dilatation. Hepatic steatosis Pancreas: Unremarkable. No pancreatic ductal dilatation or surrounding inflammatory changes. Spleen: Normal in size without focal abnormality. Adrenals/Urinary Tract: Adrenal glands are unremarkable. Kidneys are normal, without renal calculi, focal lesion, or hydronephrosis. Bladder is unremarkable. Stomach/Bowel: Stomach is within normal limits. Appendix appears normal. No evidence of bowel wall thickening, distention, or inflammatory changes. Vascular/Lymphatic: No significant vascular findings are present. No enlarged abdominal or pelvic lymph nodes. Reproductive: Uterus and bilateral adnexa are unremarkable. Other: No abdominal wall hernia or abnormality. No abdominopelvic ascites. Musculoskeletal: No acute or significant osseous findings. IMPRESSION: 1. No CT evidence for acute  intra-abdominal or pelvic abnormality. 2. Hepatic steatosis. Status post cholecystectomy. Electronically Signed   By: Jasmine Pang M.D.   On: 01/28/2023 18:01    Procedures .Critical Care  Performed by: Arabella Merles, PA-C Authorized by: Arabella Merles, PA-C   Critical care provider statement:    Critical care time (minutes):  32   Critical care was necessary to treat or prevent imminent or life-threatening deterioration of the following conditions:  Metabolic crisis   Critical care was time spent personally by me on the following activities:  Ordering and performing treatments and interventions, ordering and review of laboratory studies, re-evaluation of patient's condition, review of old charts, development of treatment plan with patient or surrogate, evaluation of patient's response to treatment and obtaining history from patient or surrogate Comments:     Needed multiple runs of IV potassium for hypokalemia     Medications Ordered in  ED Medications  potassium chloride 10 mEq in 100 mL IVPB (10 mEq Intravenous New Bag/Given 01/28/23 1844)  0.9 %  sodium chloride infusion ( Intravenous New Bag/Given 01/28/23 1722)  droperidol (INAPSINE) 2.5 MG/ML injection 2.5 mg (2.5 mg Intravenous Given 01/28/23 1500)  lactated ringers bolus 1,000 mL (0 mLs Intravenous Stopped 01/28/23 1703)  prochlorperazine (COMPAZINE) injection 10 mg (10 mg Intravenous Given 01/28/23 1605)  iohexol (OMNIPAQUE) 300 MG/ML solution 100 mL (100 mLs Intravenous Contrast Given 01/28/23 1706)    ED Course/ Medical Decision Making/ A&P                                 Medical Decision Making Amount and/or Complexity of Data Reviewed Labs: ordered. Radiology: ordered.  Risk Prescription drug management.   27 y.o. female with pertinent past medical history of cannabinoid hyperemesis syndrome, GERD presents to the ED for concern of nausea, vomiting, diarrhea for the past 3 days after eating [employer]  Differential diagnosis includes but is not limited to gastroenteritis, URI, appendicitis, diverticulitis, small bowel obstruction ED Course:  Patient well-appearing in no duress.  Vital signs stable except for a slightly elevated blood pressure 151/99 upon arrival.  Afebrile, not tachycardic.  She reports nausea and vomiting at home, but not actively vomiting in the room.  Also reports some nonbloody diarrhea.  This started after eating [employer], the rest of family also developed some nausea, vomiting, diarrhea after eating [employer].  This story seems consistent with gastroenteritis.  She was evaluated yesterday here, and at [facility] this morning for her nausea and vomiting.  Reports the droperidol she was given helped, reports the Zofran and phenergan at home has not helped with her nausea and vomiting.  Reports difficulty advancing to brat diet. EKG was checked which showed no prolonged QT interval.  Will give droperidol for nausea and vomiting as she  reports this was helpful.  CBC with leukocytosis of 18.1, this is elevated from 14.4 yesterday. Will obtain CT Abdomen and pelvis imaging to rule out acute abdominal pathology given patient report of abdominal pain and increased white count. Upon re-evaluation, patient is holding emesis bag, reports she still feels nauseous after droperidol.  No active emesis.  I discussed that her potassium was low today.  She does not think she will be able to keep oral potassium down currently.  Will proceed with IV potassium repletion with 3 runs of IV KCl in combination with LR bolus.  Will also add on Compazine for further nausea control. Upon reevaluation,  patient feeling less nauseous after the Phenergan, comfortable appearing. Still without active emesis Patient underwent p.o. trial with apple juice, and was not able to hold any down.  Given unable to tolerate oral intake and hypokalemia, will plan for admission.  Impression: Hypokalemia Nausea and vomiting, unable to tolerate PO  intake  Disposition:  Patient will be admitted with the hospitalist Dr. Antionette Char at [facility]  Lab Tests: I Ordered, and personally interpreted labs.  The pertinent results include:   CBC with leukocytosis of 18.1 CMP with hypokalemia at 2.7, hyponatremia at 133 Lipase within normal limits at 22  Imaging Studies ordered: I ordered imaging studies including CT abdomen pelvis   I independently visualized the imaging with scope of interpretation limited to determining acute life threatening conditions related to emergency care. Imaging showed no acute abdominal pathology I agree with the radiologist interpretation   Cardiac Monitoring: / EKG: The patient was maintained on a cardiac monitor.  I personally viewed and interpreted the cardiac monitored which showed an underlying rhythm of: Normal sinus rhythm, no QT prolongation   Consultations Obtained: I requested consultation with the hospitalist Dr. Antionette Char,  and  discussed lab and imaging findings as well as pertinent plan 7:44pm - they recommend: Admission for continuation of fluids, monitoring of electrolytes  External records from outside source obtained and reviewed including ER visit from yesterday, ER visit from [facility] this morning.  At both visits patient was given droperidol. WBC from yesterday 14.4   Co morbidities that complicate the patient evaluation  Cannabinoid hyperemesis syndrome, GERD             Final Clinical Impression(s) / ED Diagnoses Final diagnoses:  Nausea and vomiting, unspecified vomiting type  Hypokalemia    Rx / DC Orders ED Discharge Orders     None         Arabella Merles, PA-C 01/28/23 1944    Anders Simmonds T, DO 01/31/23 1131

## 2023-01-28 NOTE — ED Notes (Signed)
PO challenge failed, pt with emesis occurrence.  EDP made aware.

## 2023-01-28 NOTE — ED Notes (Signed)
Pt transported to imaging.

## 2023-01-29 DIAGNOSIS — R112 Nausea with vomiting, unspecified: Secondary | ICD-10-CM | POA: Diagnosis not present

## 2023-01-29 LAB — CBC WITH DIFFERENTIAL/PLATELET
Abs Immature Granulocytes: 0.07 10*3/uL (ref 0.00–0.07)
Basophils Absolute: 0 10*3/uL (ref 0.0–0.1)
Basophils Relative: 0 %
Eosinophils Absolute: 0 10*3/uL (ref 0.0–0.5)
Eosinophils Relative: 0 %
HCT: 39.2 % (ref 36.0–46.0)
Hemoglobin: 13.2 g/dL (ref 12.0–15.0)
Immature Granulocytes: 1 %
Lymphocytes Relative: 14 %
Lymphs Abs: 2.1 10*3/uL (ref 0.7–4.0)
MCH: 28.2 pg (ref 26.0–34.0)
MCHC: 33.7 g/dL (ref 30.0–36.0)
MCV: 83.8 fL (ref 80.0–100.0)
Monocytes Absolute: 1.4 10*3/uL — ABNORMAL HIGH (ref 0.1–1.0)
Monocytes Relative: 9 %
Neutro Abs: 11.4 10*3/uL — ABNORMAL HIGH (ref 1.7–7.7)
Neutrophils Relative %: 76 %
Platelets: 312 10*3/uL (ref 150–400)
RBC: 4.68 MIL/uL (ref 3.87–5.11)
RDW: 13.4 % (ref 11.5–15.5)
WBC: 14.9 10*3/uL — ABNORMAL HIGH (ref 4.0–10.5)
nRBC: 0 % (ref 0.0–0.2)

## 2023-01-29 LAB — CBC
HCT: 38.8 % (ref 36.0–46.0)
Hemoglobin: 12.8 g/dL (ref 12.0–15.0)
MCH: 28.2 pg (ref 26.0–34.0)
MCHC: 33 g/dL (ref 30.0–36.0)
MCV: 85.5 fL (ref 80.0–100.0)
Platelets: 291 10*3/uL (ref 150–400)
RBC: 4.54 MIL/uL (ref 3.87–5.11)
RDW: 13.7 % (ref 11.5–15.5)
WBC: 15.6 10*3/uL — ABNORMAL HIGH (ref 4.0–10.5)
nRBC: 0 % (ref 0.0–0.2)

## 2023-01-29 LAB — COMPREHENSIVE METABOLIC PANEL
ALT: 16 U/L (ref 0–44)
AST: 15 U/L (ref 15–41)
Albumin: 4.4 g/dL (ref 3.5–5.0)
Alkaline Phosphatase: 74 U/L (ref 38–126)
Anion gap: 13 (ref 5–15)
BUN: 7 mg/dL (ref 6–20)
CO2: 22 mmol/L (ref 22–32)
Calcium: 9.7 mg/dL (ref 8.9–10.3)
Chloride: 101 mmol/L (ref 98–111)
Creatinine, Ser: 0.78 mg/dL (ref 0.44–1.00)
GFR, Estimated: >60 mL/min (ref >60)
Glucose, Bld: 120 mg/dL — ABNORMAL HIGH (ref 70–99)
Potassium: 3.1 mmol/L — ABNORMAL LOW (ref 3.5–5.1)
Sodium: 136 mmol/L (ref 135–145)
Total Bilirubin: 1.5 mg/dL — ABNORMAL HIGH (ref 0.3–1.2)
Total Protein: 7.7 g/dL (ref 6.5–8.1)

## 2023-01-29 LAB — CREATININE, SERUM
Creatinine, Ser: 0.66 mg/dL (ref 0.44–1.00)
GFR, Estimated: >60 mL/min (ref >60)

## 2023-01-29 LAB — MAGNESIUM: Magnesium: 2.1 mg/dL (ref 1.7–2.4)

## 2023-01-29 LAB — HIV ANTIBODY (ROUTINE TESTING W REFLEX): HIV Screen 4th Generation wRfx: NONREACTIVE

## 2023-01-29 MED ORDER — POTASSIUM CHLORIDE 10 MEQ/100ML IV SOLN
10.0000 meq | INTRAVENOUS | Status: DC
  Administered 2023-01-29: 10 meq via INTRAVENOUS
  Filled 2023-01-29: qty 100

## 2023-01-29 MED ORDER — NICOTINE 21 MG/24HR TD PT24
21.0000 mg | MEDICATED_PATCH | Freq: Every day | TRANSDERMAL | Status: DC
  Administered 2023-01-29 – 2023-01-30 (×2): 21 mg via TRANSDERMAL
  Filled 2023-01-29 (×2): qty 1

## 2023-01-29 MED ORDER — POTASSIUM CHLORIDE 10 MEQ/100ML IV SOLN
10.0000 meq | Freq: Once | INTRAVENOUS | Status: AC
  Administered 2023-01-29: 10 meq via INTRAVENOUS
  Filled 2023-01-29: qty 100

## 2023-01-29 MED ORDER — DICYCLOMINE HCL 20 MG PO TABS: 20.0000 mg | ORAL_TABLET | Freq: Two times a day (BID) | ORAL | Status: DC

## 2023-01-29 MED ORDER — DULOXETINE HCL 20 MG PO CPEP
20.0000 mg | ORAL_CAPSULE | Freq: Every day | ORAL | Status: DC
  Administered 2023-01-30: 20 mg via ORAL
  Filled 2023-01-29 (×2): qty 1

## 2023-01-29 NOTE — Progress Notes (Signed)
PROGRESS NOTE Christine Trujillo  ZOX:[id number] DOB: 1996/03/11 DOA: 01/28/2023 PCP: Sharlene Dory, DO  Brief Narrative/Hospital Course: 27 year old female with history of anxiety, depression and cannabis hyperemesis syndrome admitted with intractable nausea vomiting Since Wednesday 01/26/2023.  She had mild abdominal discomfort subjective fever and chills. In the ED-blood pressure borderline controlled afebrile.  Labs showed hypokalemia stable LFTs mild leukocytosis, UA WBC 0-5, pregnancy test negative CT abdomen pelvis with contrast>> hepatic steatosis, postcholecystectomy, no acute evidence of intra-abdominal finding Patient was admitted for further management    Subjective: Seen and examined, Overnight vitals/labs/events reviewed  Labs shows potassium low 3.1 leukocytosis persist fluctuating Patient has been vomiting her blood pressure on higher side Currently she is resting well, her fianc at the bedside Assessment and Plan: Principal Problem:   Intractable nausea and vomiting Active Problems:   Depression, recurrent (HCC)   GAD (generalized anxiety disorder)   Hypokalemia   Intractable nausea vomiting with cannabinoid hyperemesis syndrome: CT abdomen no acute finding.  Continue symptomatic management with IVF, antiemetics, PPI, CLD, Bentyl, pain management. On CLD> ADA T as tolerated.  Discussed THC cessation.  Hypokalemia: Being replaced IV. Recent Labs  Lab 01/27/23 1146 01/28/23 1439 01/29/23 0552  K 3.3* 2.7* 3.1*    Tobacco use: Continue nicotine patch, advised cessation.  GERD: Continue PPI  GAD: Resume Cymbalta as able  Leukocytosis: Likely reactive in the setting of 1, no evidence of UTI, CT abdomen neck negative  Morbid obesity:Patient's Body mass index is 43.27 kg/m. : Will benefit with PCP follow-up, weight loss  healthy lifestyle and outpatient sleep evaluation.   DVT prophylaxis: enoxaparin (LOVENOX) injection 40 mg Start: 01/29/23  1000 Code Status:   Code Status: Full Code Family Communication: plan of care discussed with patient/ at bedside. Patient status is:  admitted as observation but remains hospitalized for ongoing  because of intractable nausea and vomiting Level of care: Telemetry   Dispo: The patient is from: Home            Anticipated disposition: TBD Objective: Vitals last 24 hrs: Vitals:   01/28/23 2100 01/29/23 0100 01/29/23 0500 01/29/23 0822  BP: (!) 147/88 (!) 152/93 129/83 (!) 164/102  Pulse: 70 (!) 49 62 (!) 54  Resp: 18  20 14   Temp: 98.8 F (37.1 C) 98.7 F (37.1 C) 98.6 F (37 C) 98.5 F (36.9 C)  TempSrc: Oral Oral Oral Oral  SpO2: 99% 100% 100% 100%  Weight: 110.8 kg     Height: 5\' 3"  (1.6 m)      Weight change:   Physical Examination: General exam: alert awake, older than stated age HEENT:Oral mucosa moist, Ear/Nose WNL grossly Respiratory system: bilaterally clear BS, no use of accessory muscle Cardiovascular system: S1 & S2 +, No JVD. Gastrointestinal system: Abdomen soft, mild generalized tenderness,ND, BS+ Nervous System:Alert, awake, moving extremities. Extremities: LE edema neg,distal peripheral pulses palpable.  Skin: No rashes,no icterus. MSK: Normal muscle bulk,tone, power  Medications reviewed:  Scheduled Meds:  DULoxetine  20 mg Oral Daily   enoxaparin (LOVENOX) injection  40 mg Subcutaneous Q24H   nicotine  21 mg Transdermal Daily   pantoprazole (PROTONIX) IV  40 mg Intravenous Q24H  Continuous Infusions:  sodium chloride Stopped (01/28/23 2006)   0.9 % NaCl with KCl 20 mEq / L 40 mL/hr at 01/28/23 2211   promethazine (PHENERGAN) injection (IM or IVPB)      Diet Order             Diet clear liquid Room  service appropriate? Yes; Fluid consistency: Thin  Diet effective now                  Intake/Output Summary (Last 24 hours) at 01/29/2023 0930 Last data filed at 01/29/2023 0930 Gross per 24 hour  Intake 1618.58 ml  Output --  Net 1618.58 ml    Net IO Since Admission: 1,618.58 mL [01/29/23 0930]  Wt Readings from Last 3 Encounters:  01/28/23 110.8 kg  01/27/23 113.4 kg  12/23/22 113.4 kg     Unresulted Labs (From admission, onward)     Start     Ordered   02/04/23 0500  Creatinine, serum  (enoxaparin (LOVENOX)    CrCl >/= 30 ml/min)  Weekly,   R     Comments: while on enoxaparin therapy    01/28/23 2133   01/30/23 0500  Basic metabolic panel  Daily,   R      01/29/23 0836   01/30/23 0500  CBC  Daily,   R      01/29/23 0836   01/29/23 0500  HIV Antibody (routine testing w rflx)  Once,   R        01/29/23 0500          Data Reviewed: I have personally reviewed following labs and imaging studies CBC: Recent Labs  Lab 01/27/23 1146 01/28/23 1439 01/29/23 0000 01/29/23 0552  WBC 14.4* 18.1* 15.6* 14.9*  NEUTROABS 11.3* 14.3*  --  11.4*  HGB 13.6 13.2 12.8 13.2  HCT 41.0 39.1 38.8 39.2  MCV 84.0 82.5 85.5 83.8  PLT 349 330 291 312   Basic Metabolic Panel: Recent Labs  Lab 01/27/23 1146 01/28/23 1439 01/29/23 0000 01/29/23 0552  NA 138 133*  --  136  K 3.3* 2.7*  --  3.1*  CL 104 98  --  101  CO2 20* 22  --  22  GLUCOSE 141* 106*  --  120*  BUN 6 9  --  7  CREATININE 0.91 0.87 0.66 0.78  CALCIUM 9.5 9.1  --  9.7  MG  --   --  2.1  --    GFR: Estimated Creatinine Clearance: 125.3 mL/min (by C-G formula based on SCr of 0.78 mg/dL). Liver Function Tests: Recent Labs  Lab 01/27/23 1146 01/28/23 1439 01/29/23 0552  AST 12* 18 15  ALT 9 15 16   ALKPHOS 76 72 74  BILITOT 0.9 1.5* 1.5*  PROT 7.2 7.6 7.7  ALBUMIN 4.2 4.2 4.4   Recent Labs  Lab 01/27/23 1146 01/28/23 1439  LIPASE 22 22  No results found for this or any previous visit (from the past 240 hour(s)).  Antimicrobials: Anti-infectives (From admission, onward)    None     Culture/Microbiology    Component Value Date/Time   SDES  04/03/2020 1358    URINE, RANDOM Performed at [facility], [address] [place]  [place], [place] [address]    [facility]  04/03/2020 1358    NONE Performed at [facility], [address] [place], [place] [address]    CULT MULTIPLE SPECIES PRESENT, SUGGEST RECOLLECTION (A) 04/03/2020 1358   REPTSTATUS 04/05/2020 FINAL 04/03/2020 1358    Radiology Studies: CT ABDOMEN PELVIS W CONTRAST  Result Date: 01/28/2023 CLINICAL DATA:  Diarrhea pain and vomiting EXAM: CT ABDOMEN AND PELVIS WITH CONTRAST TECHNIQUE: Multidetector CT imaging of the abdomen and pelvis was performed using the standard protocol following bolus administration of intravenous contrast. RADIATION DOSE REDUCTION: This exam  was performed according to the departmental dose-optimization program which includes automated exposure control, adjustment of the mA and/or kV according to patient size and/or use of iterative reconstruction technique. CONTRAST:  OMNIPAQUE IOHEXOL 300 MG/ML  SOLN COMPARISON:  12/23/2022 FINDINGS: Lower chest: Lung bases are clear. Hepatobiliary: No focal liver abnormality is seen. Status post cholecystectomy. No biliary dilatation. Hepatic steatosis Pancreas: Unremarkable. No pancreatic ductal dilatation or surrounding inflammatory changes. Spleen: Normal in size without focal abnormality. Adrenals/Urinary Tract: Adrenal glands are unremarkable. Kidneys are normal, without renal calculi, focal lesion, or hydronephrosis. Bladder is unremarkable. Stomach/Bowel: Stomach is within normal limits. Appendix appears normal. No evidence of bowel wall thickening, distention, or inflammatory changes. Vascular/Lymphatic: No significant vascular findings are present. No enlarged abdominal or pelvic lymph nodes. Reproductive: Uterus and bilateral adnexa are unremarkable. Other: No abdominal wall hernia or abnormality. No abdominopelvic ascites. Musculoskeletal: No acute or significant osseous findings. IMPRESSION: 1. No CT evidence for acute intra-abdominal or pelvic abnormality. 2. Hepatic steatosis.  Status post cholecystectomy. Electronically Signed   By: Jasmine Pang M.D.   On: 01/28/2023 18:01     LOS: 0 days   Lanae Boast, MD [facility]  01/29/2023, 9:30 AM

## 2023-01-29 NOTE — Plan of Care (Signed)

## 2023-01-29 NOTE — Hospital Course (Signed)
27 year old female with history of anxiety, depression and cannabis hyperemesis syndrome admitted with intractable nausea vomiting Since Wednesday 01/26/2023.  She had mild abdominal discomfort subjective fever and chills. In the ED-blood pressure borderline controlled afebrile.  Labs showed hypokalemia stable LFTs mild leukocytosis, UA WBC 0-5, pregnancy test negative CT abdomen pelvis with contrast>> hepatic steatosis, postcholecystectomy, no acute evidence of intra-abdominal finding Patient was admitted for further management Diet was slowly advanced and tolerating well, clinically improving, electrolytes also improved Sasan afebrile leukocytosis resolved.  Nursing reports that throughout the night patient has been asking for food and has been eating.  #7 nausea and vomiting.  She is stable and will be discharged home. We discussed extensively about cannabinoid cessation

## 2023-01-29 NOTE — Plan of Care (Signed)

## 2023-01-29 NOTE — Plan of Care (Signed)
  Problem: Clinical Measurements: Goal: Will remain free from infection Outcome: Progressing Goal: Diagnostic test results will improve Outcome: Progressing Goal: Respiratory complications will improve Outcome: Progressing   Problem: Activity: Goal: Risk for activity intolerance will decrease Outcome: Progressing   Problem: Coping: Goal: Level of anxiety will decrease Outcome: Progressing   Problem: Pain Managment: Goal: General experience of comfort will improve Outcome: Progressing

## 2023-01-30 DIAGNOSIS — R112 Nausea with vomiting, unspecified: Secondary | ICD-10-CM | POA: Diagnosis not present

## 2023-01-30 LAB — BASIC METABOLIC PANEL
Anion gap: 10 (ref 5–15)
BUN: 10 mg/dL (ref 6–20)
CO2: 23 mmol/L (ref 22–32)
Calcium: 9.2 mg/dL (ref 8.9–10.3)
Chloride: 104 mmol/L (ref 98–111)
Creatinine, Ser: 0.95 mg/dL (ref 0.44–1.00)
GFR, Estimated: >60 mL/min (ref >60)
Glucose, Bld: 94 mg/dL (ref 70–99)
Potassium: 3.4 mmol/L — ABNORMAL LOW (ref 3.5–5.1)
Sodium: 137 mmol/L (ref 135–145)

## 2023-01-30 LAB — CBC
HCT: 41.2 % (ref 36.0–46.0)
Hemoglobin: 13.5 g/dL (ref 12.0–15.0)
MCH: 28.3 pg (ref 26.0–34.0)
MCHC: 32.8 g/dL (ref 30.0–36.0)
MCV: 86.4 fL (ref 80.0–100.0)
Platelets: 284 10*3/uL (ref 150–400)
RBC: 4.77 MIL/uL (ref 3.87–5.11)
RDW: 13.4 % (ref 11.5–15.5)
WBC: 10.8 10*3/uL — ABNORMAL HIGH (ref 4.0–10.5)
nRBC: 0 % (ref 0.0–0.2)

## 2023-01-30 MED ORDER — POTASSIUM CHLORIDE 10 MEQ/100ML IV SOLN
10.0000 meq | INTRAVENOUS | Status: AC
  Administered 2023-01-30 (×2): 10 meq via INTRAVENOUS
  Filled 2023-01-30 (×2): qty 100

## 2023-01-30 MED ORDER — PROMETHAZINE HCL 25 MG PO TABS: 25.0000 mg | ORAL_TABLET | Freq: Four times a day (QID) | ORAL | 0 refills | Status: DC | PRN

## 2023-01-30 MED ORDER — POTASSIUM CHLORIDE ER 20 MEQ PO TBCR: 20.0000 meq | EXTENDED_RELEASE_TABLET | Freq: Every day | ORAL | 0 refills | Status: DC

## 2023-01-30 NOTE — Discharge Summary (Signed)
Physician Discharge Summary  Christine Trujillo AOZ:[id number] DOB: [birth year] DOA: 01/28/2023  PCP: Sharlene Dory, DO  Admit date: 01/28/2023 Discharge date: 01/30/2023 Recommendations for Outpatient Follow-up:  Follow up with PCP in 1 weeks-call for appointment Please obtain BMP/CBC in one week  Discharge Dispo: Home Discharge Condition: Stable Code Status:   Code Status: Full Code Diet recommendation:  Diet Order             DIET SOFT Room service appropriate? Yes; Fluid consistency: Thin  Diet effective now                    Brief/Interim Summary: 27 year old female with history of anxiety, depression and cannabis hyperemesis syndrome admitted with intractable nausea vomiting Since Wednesday 01/26/2023.  She had mild abdominal discomfort subjective fever and chills. In the ED-blood pressure borderline controlled afebrile.  Labs showed hypokalemia stable LFTs mild leukocytosis, UA WBC 0-5, pregnancy test negative CT abdomen pelvis with contrast>> hepatic steatosis, postcholecystectomy, no acute evidence of intra-abdominal finding Patient was admitted for further management Diet was slowly advanced and tolerating well, clinically improving, electrolytes also improved Sasan afebrile leukocytosis resolved.  Nursing reports that throughout the night patient has been asking for food and has been eating.  #7 nausea and vomiting.  She is stable and will be discharged home. We discussed extensively about cannabinoid cessation    Discharge Diagnoses:  Principal Problem:   Intractable nausea and vomiting Active Problems:   Depression, recurrent (HCC)   GAD (generalized anxiety disorder)   Hypokalemia  Intractable nausea vomiting with cannabinoid hyperemesis syndrome: CT abdomen no acute finding.  Managed with conservative measures diet was advanced patient has been eating throughout the night per nursing staff and they have exhausted their resources due to patient's  frequent demand for food.   Will send her with supplies FMR1 (he states Zofran does not work at home She is aware and we discussed about cannabinoid cessation   Hypokalemia Replaced.  Tobacco use: Continue nicotine patch, advised cessation.   GERD: Continue PPI   GAD: Resume Cymbalta as able   Leukocytosis: Likely reactive in the setting of 1.  Resolving. no evidence of UTI, CT abdomen is negative   Recent Labs  Lab 01/27/23 1146 01/28/23 1439 01/29/23 0000 01/29/23 0552 01/30/23 0538  WBC 14.4* 18.1* 15.6* 14.9* 10.8*    Morbid obesity:Patient's Body mass index is 43.27 kg/m. : Will benefit with PCP follow-up, weight loss  healthy lifestyle and outpatient sleep evaluation.  Consults: none Subjective: Alert awake oriented no nausea vomiting.  Tolerating diet.  Eager to go home  Discharge Exam: Vitals:   01/29/23 2143 01/30/23 0556  BP: (!) 146/86 (!) 141/89  Pulse: (!) 55   Resp: 18 18  Temp: 99.2 F (37.3 C) 98.4 F (36.9 C)  SpO2: 100% 100%   General: Pt is alert, awake, not in acute distress Cardiovascular: RRR, S1/S2 +, no rubs, no gallops Respiratory: CTA bilaterally, no wheezing, no rhonchi Abdominal: Soft, NT, ND, bowel sounds + Extremities: no edema, no cyanosis  Discharge Instructions  Discharge Instructions     Discharge instructions   Complete by: As directed    Please do not smoke any marijuana as it will make you have intractable nausea and vomiting  Please call call MD or return to ER for similar or worsening recurring problem that brought you to hospital or if any fever,nausea/vomiting,abdominal pain, uncontrolled pain, chest pain,  shortness of breath or any other alarming symptoms.  Please follow-up your  doctor as instructed in a week time and call the office for appointment.  Please avoid alcohol, smoking, or any other illicit substance and maintain healthy habits including taking your regular medications as prescribed.  You were  cared for by a hospitalist during your hospital stay. If you have any questions about your discharge medications or the care you received while you were in the hospital after you are discharged, you can call the unit and ask to speak with the hospitalist on call if the hospitalist that took care of you is not available.  Once you are discharged, your primary care physician will handle any further medical issues. Please note that NO REFILLS for any discharge medications will be authorized once you are discharged, as it is imperative that you return to your primary care physician (or establish a relationship with a primary care physician if you do not have one) for your aftercare needs so that they can reassess your need for medications and monitor your lab values   Increase activity slowly   Complete by: As directed       Allergies as of 01/30/2023   No Known Allergies      Medication List     STOP taking these medications    dicyclomine 20 MG tablet Commonly known as: BENTYL   ondansetron 4 MG disintegrating tablet Commonly known as: ZOFRAN-ODT   potassium chloride SA 20 MEQ tablet Commonly known as: KLOR-CON M       TAKE these medications    DULoxetine 20 MG capsule Commonly known as: Cymbalta Take 1 capsule (20 mg total) by mouth daily.   multivitamin with minerals tablet Take 1 tablet by mouth daily.   Potassium Chloride ER 20 MEQ Tbcr Take 1 tablet (20 mEq total) by mouth daily.   promethazine 25 MG tablet Commonly known as: PHENERGAN Take 1 tablet (25 mg total) by mouth every 6 (six) hours as needed for nausea or vomiting.   Varenicline Tartrate (Starter) 0.5 MG X 11 & 1 MG X 42 Tbpk Commonly known as: Chantix Starting [employer] on package.        Follow-up Information     Sharlene Dory, DO Follow up in 1 week(s).   Specialty: Family Medicine Contact information: [address] [place]  [address] [phone]                No Known Allergies  The results of significant diagnostics from this hospitalization (including imaging, microbiology, ancillary and laboratory) are listed below for reference.    Microbiology: No results found for this or any previous visit (from the past 240 hour(s)).  Procedures/Studies: CT ABDOMEN PELVIS W CONTRAST  Result Date: 01/28/2023 CLINICAL DATA:  Diarrhea pain and vomiting EXAM: CT ABDOMEN AND PELVIS WITH CONTRAST TECHNIQUE: Multidetector CT imaging of the abdomen and pelvis was performed using the standard protocol following bolus administration of intravenous contrast. RADIATION DOSE REDUCTION: This exam was performed according to the departmental dose-optimization program which includes automated exposure control, adjustment of the mA and/or kV according to patient size and/or use of iterative reconstruction technique. CONTRAST:  OMNIPAQUE IOHEXOL 300 MG/ML  SOLN COMPARISON:  12/23/2022 FINDINGS: Lower chest: Lung bases are clear. Hepatobiliary: No focal liver abnormality is seen. Status post cholecystectomy. No biliary dilatation. Hepatic steatosis Pancreas: Unremarkable. No pancreatic ductal dilatation or surrounding inflammatory changes. Spleen: Normal in size without focal abnormality. Adrenals/Urinary Tract: Adrenal glands are unremarkable. Kidneys are normal, without renal calculi,  focal lesion, or hydronephrosis. Bladder is unremarkable. Stomach/Bowel: Stomach is within normal limits. Appendix appears normal. No evidence of bowel wall thickening, distention, or inflammatory changes. Vascular/Lymphatic: No significant vascular findings are present. No enlarged abdominal or pelvic lymph nodes. Reproductive: Uterus and bilateral adnexa are unremarkable. Other: No abdominal wall hernia or abnormality. No abdominopelvic ascites. Musculoskeletal: No acute or significant osseous findings. IMPRESSION: 1. No CT evidence for acute  intra-abdominal or pelvic abnormality. 2. Hepatic steatosis. Status post cholecystectomy. Electronically Signed   By: Jasmine Pang M.D.   On: 01/28/2023 18:01    Labs: BNP (last 3 results) No results for input(s): "BNP" in the last 8760 hours. Basic Metabolic Panel: Recent Labs  Lab 01/27/23 1146 01/28/23 1439 01/29/23 0000 01/29/23 0552 01/30/23 0538  NA 138 133*  --  136 137  K 3.3* 2.7*  --  3.1* 3.4*  CL 104 98  --  101 104  CO2 20* 22  --  22 23  GLUCOSE 141* 106*  --  120* 94  BUN 6 9  --  7 10  CREATININE 0.91 0.87 0.66 0.78 0.95  CALCIUM 9.5 9.1  --  9.7 9.2  MG  --   --  2.1  --   --    Liver Function Tests: Recent Labs  Lab 01/27/23 1146 01/28/23 1439 01/29/23 0552  AST 12* 18 15  ALT 9 15 16   ALKPHOS 76 72 74  BILITOT 0.9 1.5* 1.5*  PROT 7.2 7.6 7.7  ALBUMIN 4.2 4.2 4.4   Recent Labs  Lab 01/27/23 1146 01/28/23 1439  LIPASE 22 22   No results for input(s): "AMMONIA" in the last 168 hours. CBC: Recent Labs  Lab 01/27/23 1146 01/28/23 1439 01/29/23 0000 01/29/23 0552 01/30/23 0538  WBC 14.4* 18.1* 15.6* 14.9* 10.8*  NEUTROABS 11.3* 14.3*  --  11.4*  --   HGB 13.6 13.2 12.8 13.2 13.5  HCT 41.0 39.1 38.8 39.2 41.2  MCV 84.0 82.5 85.5 83.8 86.4  PLT 349 330 291 312 284   Anemia work up No results for input(s): "VITAMINB12", "FOLATE", "FERRITIN", "TIBC", "IRON", "RETICCTPCT" in the last 72 hours. Urinalysis    Component Value Date/Time   COLORURINE YELLOW 01/28/2023 1612   APPEARANCEUR CLEAR 01/28/2023 1612   LABSPEC 1.015 01/28/2023 1612   PHURINE 7.0 01/28/2023 1612   GLUCOSEU NEGATIVE 01/28/2023 1612   HGBUR TRACE (A) 01/28/2023 1612   BILIRUBINUR NEGATIVE 01/28/2023 1612   KETONESUR NEGATIVE 01/28/2023 1612   PROTEINUR NEGATIVE 01/28/2023 1612   UROBILINOGEN 0.2 03/25/2016 1104   NITRITE NEGATIVE 01/28/2023 1612   LEUKOCYTESUR NEGATIVE 01/28/2023 1612   Sepsis Labs Recent Labs  Lab 01/28/23 1439 01/29/23 0000 01/29/23 0552  01/30/23 0538  WBC 18.1* 15.6* 14.9* 10.8*   Microbiology No results found for this or any previous visit (from the past 240 hour(s)).   Time coordinating discharge: 25 minutes  SIGNED: Lanae Boast, MD  [facility] 01/30/2023, 10:22 AM  If 7PM-7AM, please contact night-coverage [email or web address]

## 2023-01-31 ENCOUNTER — Ambulatory Visit (INDEPENDENT_AMBULATORY_CARE_PROVIDER_SITE_OTHER): Payer: MEDICAID | Admitting: Family Medicine

## 2023-01-31 ENCOUNTER — Other Ambulatory Visit (HOSPITAL_BASED_OUTPATIENT_CLINIC_OR_DEPARTMENT_OTHER): Payer: Self-pay

## 2023-01-31 VITALS — BP 120/80 | HR 85 | Temp 98.7°F | Ht 63.0 in | Wt 241.2 lb

## 2023-01-31 DIAGNOSIS — F411 Generalized anxiety disorder: Secondary | ICD-10-CM

## 2023-01-31 DIAGNOSIS — R112 Nausea with vomiting, unspecified: Secondary | ICD-10-CM | POA: Diagnosis not present

## 2023-01-31 DIAGNOSIS — F339 Major depressive disorder, recurrent, unspecified: Secondary | ICD-10-CM | POA: Diagnosis not present

## 2023-01-31 DIAGNOSIS — Z720 Tobacco use: Secondary | ICD-10-CM | POA: Diagnosis not present

## 2023-01-31 MED ORDER — PROMETHAZINE HCL 25 MG RE SUPP
25.0000 mg | Freq: Four times a day (QID) | RECTAL | 0 refills | Status: DC | PRN
  Filled 2023-01-31: qty 12, 3d supply, fill #0

## 2023-01-31 MED ORDER — NICOTINE 21 MG/24HR TD PT24
21.0000 mg | MEDICATED_PATCH | Freq: Every day | TRANSDERMAL | 0 refills | Status: AC
Start: 2023-01-31 — End: 2023-03-14
  Filled 2023-01-31: qty 28, 28d supply, fill #0

## 2023-01-31 MED ORDER — NICOTINE 14 MG/24HR TD PT24
14.0000 mg | MEDICATED_PATCH | Freq: Every day | TRANSDERMAL | 0 refills | Status: AC
  Filled 2023-01-31: qty 14, 14d supply, fill #0

## 2023-01-31 MED ORDER — DULOXETINE HCL 20 MG PO CPEP
20.0000 mg | ORAL_CAPSULE | Freq: Every day | ORAL | 3 refills | Status: DC
  Filled 2023-01-31 – 2023-07-10 (×4): qty 30, 30d supply, fill #0

## 2023-01-31 MED ORDER — NICOTINE 7 MG/24HR TD PT24
7.0000 mg | MEDICATED_PATCH | Freq: Every day | TRANSDERMAL | 0 refills | Status: DC
Start: 2023-03-28 — End: 2024-01-06
  Filled 2023-01-31: qty 28, 28d supply, fill #0

## 2023-01-31 MED ORDER — PANTOPRAZOLE SODIUM 40 MG PO TBEC
40.0000 mg | DELAYED_RELEASE_TABLET | Freq: Every day | ORAL | 1 refills | Status: DC
  Filled 2023-01-31: qty 30, 30d supply, fill #0
  Filled 2023-05-01 – 2023-08-24 (×4): qty 30, 30d supply, fill #1

## 2023-01-31 NOTE — Patient Instructions (Signed)
Give [place] 2-3 business days to get the results of your labs back.   Keep the diet clean and stay active.  Continue to advance your diet.   Let us know if you need anything.

## 2023-01-31 NOTE — Plan of Care (Signed)
[facility] Tonsillectomy/Adenoidectomy, Postoperative PEDS care plan entered in error.

## 2023-01-31 NOTE — Progress Notes (Signed)
Chief Complaint  Patient presents with   Follow-up    ER    Subjective: Patient is a 27 y.o. female here for hosp f/u.  Admitted to [facility] from 10/18-10/20 for intractable N/V.  She is eating and drinking much more normally.  Promethazine suppositories worked much better than oral medication as the orals give her a gag reflex.  Bowel movements have been mainly liquid.  No bleeding.  She has been having more reflux symptoms.  She has a history of depression/anxiety.  She was placed on her prescription duloxetine 20 mg daily in the hospital and did very well.  For some reason, she had not been taking this at home.  She would like a refill.  Prior to hospitalization, she is smoking 1.5 packs of cigarettes daily.  She is requesting the patches again as this was helpful in quitting.  She is not having shortness of breath.  Past Medical History:  Diagnosis Date   Anxiety    Cannabinoid hyperemesis syndrome    Depression    GERD (gastroesophageal reflux disease)    History of anemia    during pregnancy   History of gestational diabetes    Ovarian cyst    left   Pelvic pain    Upper respiratory symptom    symptoms started 02-16-2021 cough/ runny nose;  pcp visit 03-10-2021 with sore throat/ congestion/ sob/ body aches/ cough but no fever, negative covid/ flu/ strep resultsin epic  (03-13-2021  pt stated today only has nonproductive cough)    Objective: BP 120/80 (BP Location: Left Arm, Patient Position: Sitting, Cuff Size: Large)   Pulse 85   Temp 98.7 F (37.1 C) (Oral)   Ht 5\' 3"  (1.6 m)   Wt 241 lb 4 oz (109.4 kg)   SpO2 98%   BMI 42.74 kg/m  General: Awake, appears stated age Heart: RRR, no LE edema Lungs: CTAB, no rales, wheezes or rhonchi. No accessory muscle use Abdomen: Bowel sounds present, soft, nontender, nondistended Psych: Age appropriate judgment and insight, normal affect and mood  Assessment and Plan: Intractable nausea and vomiting - Plan: Basic metabolic panel,  CBC, promethazine (PHENERGAN) 25 MG suppository  GAD (generalized anxiety disorder) - Plan: DULoxetine (CYMBALTA) 20 MG capsule  Depression, recurrent (HCC) - Plan: DULoxetine (CYMBALTA) 20 MG capsule  Tobacco abuse - Plan: nicotine (NICODERM CQ - DOSED IN MG/24 HOURS) 21 mg/24hr patch, nicotine (NICODERM CQ - DOSED IN MG/24 HOURS) 14 mg/24hr patch, nicotine (NICODERM CQ - DOSED IN MG/24 HR) 7 mg/24hr patch  Improving.  Will send in suppositories.  Follow-up on labs from the hospital. Chronic, uncontrolled.  Start Cymbalta 20 mg daily routinely. As above. Chronic, unstable.  Counseled on cessation.  Start patches 21 mg daily for 6 weeks, 14 mg daily for 2 weeks, and 7 mg daily for 2 weeks. We will see her in 6 weeks to recheck the above. The patient voiced understanding and agreement to the plan.  [name] Kennedy, DO 01/31/23  4:11 PM

## 2023-02-01 ENCOUNTER — Other Ambulatory Visit: Payer: Self-pay | Admitting: Family Medicine

## 2023-02-01 ENCOUNTER — Other Ambulatory Visit (HOSPITAL_BASED_OUTPATIENT_CLINIC_OR_DEPARTMENT_OTHER): Payer: Self-pay

## 2023-02-01 ENCOUNTER — Other Ambulatory Visit: Payer: Self-pay

## 2023-02-01 DIAGNOSIS — R112 Nausea with vomiting, unspecified: Secondary | ICD-10-CM

## 2023-02-01 LAB — BASIC METABOLIC PANEL
BUN: 10 mg/dL (ref 6–23)
CO2: 23 meq/L (ref 19–32)
Calcium: 8.8 mg/dL (ref 8.4–10.5)
Chloride: 103 meq/L (ref 96–112)
Creatinine, Ser: 0.99 mg/dL (ref 0.40–1.20)
GFR: 77.71 mL/min (ref >60.00)
Glucose, Bld: 72 mg/dL (ref 70–99)
Potassium: 3.2 meq/L — ABNORMAL LOW (ref 3.5–5.1)
Sodium: 139 meq/L (ref 135–145)

## 2023-02-01 LAB — CBC
HCT: 42.1 % (ref 36.0–46.0)
Hemoglobin: 13.5 g/dL (ref 12.0–15.0)
MCHC: 32 g/dL (ref 30.0–36.0)
MCV: 85.9 fL (ref 78.0–100.0)
Platelets: 316 10*3/uL (ref 150.0–400.0)
RBC: 4.91 Mil/uL (ref 3.87–5.11)
RDW: 13.8 % (ref 11.5–15.5)
WBC: 11.1 10*3/uL — ABNORMAL HIGH (ref 4.0–10.5)

## 2023-02-01 MED ORDER — POTASSIUM CHLORIDE ER 10 MEQ PO TBCR: 20.0000 meq | EXTENDED_RELEASE_TABLET | Freq: Every day | ORAL | 3 refills | Status: DC

## 2023-02-02 ENCOUNTER — Other Ambulatory Visit (INDEPENDENT_AMBULATORY_CARE_PROVIDER_SITE_OTHER): Payer: MEDICAID

## 2023-02-02 DIAGNOSIS — R112 Nausea with vomiting, unspecified: Secondary | ICD-10-CM | POA: Diagnosis not present

## 2023-02-02 LAB — MAGNESIUM: Magnesium: 2 mg/dL (ref 1.5–2.5)

## 2023-02-08 ENCOUNTER — Other Ambulatory Visit: Payer: MEDICAID

## 2023-04-11 ENCOUNTER — Encounter: Payer: Self-pay | Admitting: Family Medicine

## 2023-04-11 ENCOUNTER — Other Ambulatory Visit (HOSPITAL_BASED_OUTPATIENT_CLINIC_OR_DEPARTMENT_OTHER): Payer: Self-pay

## 2023-04-11 ENCOUNTER — Ambulatory Visit (INDEPENDENT_AMBULATORY_CARE_PROVIDER_SITE_OTHER): Payer: MEDICAID | Admitting: Family Medicine

## 2023-04-11 VITALS — BP 128/86 | HR 82 | Temp 98.0°F | Resp 16 | Ht 63.0 in | Wt 241.0 lb

## 2023-04-11 DIAGNOSIS — M25572 Pain in left ankle and joints of left foot: Secondary | ICD-10-CM | POA: Diagnosis not present

## 2023-04-11 MED ORDER — MELOXICAM 15 MG PO TABS
15.0000 mg | ORAL_TABLET | Freq: Every day | ORAL | 0 refills | Status: DC
Start: 2023-04-11 — End: 2023-10-12
  Filled 2023-04-11: qty 21, 21d supply, fill #0

## 2023-04-11 MED ORDER — METHYLPREDNISOLONE ACETATE 80 MG/ML IJ SUSP
80.0000 mg | Freq: Once | INTRAMUSCULAR | Status: AC
  Administered 2023-04-11: 80 mg via INTRAMUSCULAR

## 2023-04-11 NOTE — Patient Instructions (Signed)
Heat (pad or rice pillow in microwave) over affected area, 10-15 minutes twice daily.   Ice/cold pack over area for 10-15 min twice daily.  OK to take Tylenol 1000 mg (2 extra strength tabs) or 975 mg (3 regular strength tabs) every 6 hours as needed.  Ankle Exercises It is normal to feel mild stretching, pulling, tightness, or discomfort as you do these exercises, but you should stop right away if you feel sudden pain or your pain gets worse.  Stretching and range of motion exercises These exercises warm up your muscles and joints and improve the movement and flexibility of your ankle. These exercises also help to relieve pain, numbness, and tingling. Exercise A: Dorsiflexion/Plantar Flexion    Sit with your affected knee straight or bent. Do not rest your foot on anything. Flex your affected ankle to tilt the top of your foot toward your shin. Hold this position for 5 seconds. Point your toes downward to tilt the top of your foot away from your shin. Hold this position for 5 seconds. Repeat 2 times. Complete this exercise 3 times per week. Exercise B: Ankle Alphabet    Sit with your affected foot supported at your lower leg. Do not rest your foot on anything. Make sure your foot has room to move freely. Think of your affected foot as a paintbrush, and move your foot to trace each letter of the alphabet in the air. Keep your hip and knee still while you trace. Make the letters as large as you can without increasing any discomfort. Trace every letter from A to Z. Repeat 2 times. Complete this exercise 3 times per week. Strengthening exercises These exercises build strength and endurance in your ankle. Endurance is the ability to use your muscles for a long time, even after they get tired. Exercise D: Dorsiflexors    Secure a rubber exercise band or tube to an object, such as a table leg, that will stay still when the band is pulled. Secure the other end around your affected  foot. Sit on the floor, facing the object with your affected leg extended. The band or tube should be slightly tense when your foot is relaxed. Slowly flex your affected ankle and toes to bring your foot toward you. Hold this position for 3 seconds.  Slowly return your foot to the starting position, controlling the band as you do that. Do a total of 10 repetitions. Repeat 2 times. Complete this exercise 3 times per week. Exercise E: Plantar Flexors    Sit on the floor with your affected leg extended. Loop a rubber exercise band or tube around the ball of your affected foot. The ball of your foot is on the walking surface, right under your toes. The band or tube should be slightly tense when your foot is relaxed. Slowly point your toes downward, pushing them away from you. Hold this position for 3 seconds. Slowly release the tension in the band or tube, controlling smoothly until your foot is back in the starting position. Repeat for a total of 10 repetitions. Repeat 2 times. Complete this exercise 3 times per week. Exercise F: Towel Curls    Sit in a chair on a non-carpeted surface, and put your feet on the floor. Place a towel in front of your feet.  Keeping your heel on the floor, put your affected foot on the towel. Pull the towel toward you by grabbing the towel with your toes and curling them under. Keep your heel on  the floor. Let your toes relax. Grab the towel again. Keep going until the towel is completely underneath your foot. Repeat for a total of 10 repetitions. Repeat 2 times. Complete this exercise 3 times per week. Exercise G: Heel Raise ( Plantar Flexors, Standing)     Stand with your feet shoulder-width apart. Keep your weight spread evenly over the width of your feet while you rise up on your toes. Use a wall or table to steady yourself, but try not to use it for support. If this exercise is too easy, try these options: Shift your weight toward your affected leg  until you feel challenged. If told by your health care provider, lift your uninjured leg off the floor. Hold this position for 3 seconds. Repeat for a total of 10 repetitions. Repeat 2 times. Complete this exercise 3 times per week. Exercise H: Tandem Walking Stand with one foot directly in front of the other. Slowly raise your back foot up, lifting your heel before your toes, and place it directly in front of your other foot. Continue to walk in this heel-to-toe way for 10 steps or for as long as told by your health care provider. Have a countertop or wall nearby to use if needed to keep your balance, but try not to hold onto anything for support. Repeat 2 times. Complete this exercises 3 times per week. Make sure you discuss any questions you have with your health care provider. Document Released: 02/10/2005 Document Revised: 11/27/2015 Document Reviewed: 12/15/2014 Elsevier Interactive Patient Education  2018 [employer].

## 2023-04-11 NOTE — Progress Notes (Signed)
Musculoskeletal Exam  Patient: Christine Trujillo DOB: Nov 08, 1995  DOS: 04/11/2023  SUBJECTIVE:  Chief Complaint:   L ankle pain   Christine Trujillo is a 27 y.o.  female for evaluation and treatment of L ankle pain.   Onset: 2 years ago she fractured her left ankle after slipping on ice Location: L ankle  Character:  sharp  Progression of issue:  Flared 1 mo ago after being on her feet more frequently Associated symptoms: swelling; worse w walking No bruising, redness Treatment: to date has been ice and OTC NSAIDS.   Neurovascular symptoms: no  Past Medical History:  Diagnosis Date   Anxiety    Cannabinoid hyperemesis syndrome    Depression    GERD (gastroesophageal reflux disease)    History of anemia    during pregnancy   History of gestational diabetes    Ovarian cyst    left   Pelvic pain    Upper respiratory symptom    symptoms started 02-16-2021 cough/ runny nose;  pcp visit 03-10-2021 with sore throat/ congestion/ sob/ body aches/ cough but no fever, negative covid/ flu/ strep resultsin epic  (03-13-2021  pt stated today only has nonproductive cough)    Objective: VITAL SIGNS: BP 128/86   Pulse 82   Temp 98 F (36.7 C) (Oral)   Resp 16   Ht 5\' 3"  (1.6 m)   Wt 241 lb (109.3 kg)   SpO2 98%   BMI 42.69 kg/m  Constitutional: Well formed, well developed. No acute distress. Thorax & Lungs: No accessory muscle use Musculoskeletal: L ankle.   Normal active range of motion: yes.   Normal passive range of motion: yes Tenderness to palpation: yes over ATFL and fib longus Deformity: soft tissue edema anteriorly Ecchymosis: no Tests positive: none Tests negative: anterior drawer, squeeze Neurologic: Normal sensory function.  Psychiatric: Normal mood. Age appropriate judgment and insight. Alert & oriented x 3.    Assessment:  Left ankle pain, unspecified chronicity - Plan: meloxicam (MOBIC) 15 MG tablet, methylPREDNISolone acetate (DEPO-MEDROL) injection 80  mg  Plan: Exacerbation of chronic issue.  Steroid injection as above.  Meloxicam 15 mg daily as needed starting tomorrow.  She does have some tenderness over her distal musculature crossing the ankle and ligaments.  Ankle stretches/exercises, heat, ice, Tylenol.  Would consider physical therapy if no improvement. F/u prn. The patient voiced understanding and agreement to the plan.   [name] Sherman, DO 04/11/23  11:58 AM

## 2023-05-01 ENCOUNTER — Other Ambulatory Visit: Payer: Self-pay | Admitting: Family Medicine

## 2023-05-01 DIAGNOSIS — R112 Nausea with vomiting, unspecified: Secondary | ICD-10-CM

## 2023-05-02 ENCOUNTER — Other Ambulatory Visit (HOSPITAL_BASED_OUTPATIENT_CLINIC_OR_DEPARTMENT_OTHER): Payer: Self-pay

## 2023-05-02 MED ORDER — PROMETHAZINE HCL 25 MG RE SUPP
25.0000 mg | Freq: Four times a day (QID) | RECTAL | 0 refills | Status: DC | PRN
  Filled 2023-05-02: qty 12, 3d supply, fill #0

## 2023-05-04 ENCOUNTER — Other Ambulatory Visit: Payer: Self-pay

## 2023-05-04 MED ORDER — VARENICLINE TARTRATE (STARTER) 0.5 MG X 11 & 1 MG X 42 PO TBPK: ORAL_TABLET | ORAL | 0 refills | Status: DC

## 2023-05-04 NOTE — Telephone Encounter (Signed)
Sent pt mychart message letting her know medication  Varenicline is starter pack.  Varenicline Tartrate, Starter, (CHANTIX STARTING MONTH PAK) 0.5 MG X 11 & 1 MG X 42 TBPK

## 2023-05-16 ENCOUNTER — Other Ambulatory Visit (HOSPITAL_BASED_OUTPATIENT_CLINIC_OR_DEPARTMENT_OTHER): Payer: Self-pay

## 2023-06-17 ENCOUNTER — Other Ambulatory Visit (HOSPITAL_BASED_OUTPATIENT_CLINIC_OR_DEPARTMENT_OTHER): Payer: Self-pay

## 2023-06-17 ENCOUNTER — Other Ambulatory Visit: Payer: Self-pay

## 2023-06-17 ENCOUNTER — Emergency Department (HOSPITAL_BASED_OUTPATIENT_CLINIC_OR_DEPARTMENT_OTHER)
Admission: EM | Admit: 2023-06-17 | Discharge: 2023-06-17 | Disposition: A | Discharge status: Home / Self Care | Payer: MEDICAID | Attending: Emergency Medicine | Admitting: Emergency Medicine

## 2023-06-17 DIAGNOSIS — R112 Nausea with vomiting, unspecified: Secondary | ICD-10-CM | POA: Diagnosis present

## 2023-06-17 DIAGNOSIS — R1110 Vomiting, unspecified: Secondary | ICD-10-CM

## 2023-06-17 DIAGNOSIS — R197 Diarrhea, unspecified: Secondary | ICD-10-CM | POA: Diagnosis not present

## 2023-06-17 LAB — CBG MONITORING, ED: Glucose-Capillary: 176 mg/dL — ABNORMAL HIGH (ref 70–99)

## 2023-06-17 LAB — CBC
HCT: 40.9 % (ref 36.0–46.0)
Hemoglobin: 13.8 g/dL (ref 12.0–15.0)
MCH: 28.2 pg (ref 26.0–34.0)
MCHC: 33.7 g/dL (ref 30.0–36.0)
MCV: 83.6 fL (ref 80.0–100.0)
Platelets: 329 10*3/uL (ref 150–400)
RBC: 4.89 MIL/uL (ref 3.87–5.11)
RDW: 12.9 % (ref 11.5–15.5)
WBC: 17.3 10*3/uL — ABNORMAL HIGH (ref 4.0–10.5)
nRBC: 0 % (ref 0.0–0.2)

## 2023-06-17 LAB — COMPREHENSIVE METABOLIC PANEL
ALT: 17 U/L (ref 0–44)
AST: 22 U/L (ref 15–41)
Albumin: 4.5 g/dL (ref 3.5–5.0)
Alkaline Phosphatase: 79 U/L (ref 38–126)
Anion gap: 12 (ref 5–15)
BUN: 13 mg/dL (ref 6–20)
CO2: 18 mmol/L — ABNORMAL LOW (ref 22–32)
Calcium: 9.5 mg/dL (ref 8.9–10.3)
Chloride: 103 mmol/L (ref 98–111)
Creatinine, Ser: 0.82 mg/dL (ref 0.44–1.00)
GFR, Estimated: >60 mL/min (ref >60)
Glucose, Bld: 148 mg/dL — ABNORMAL HIGH (ref 70–99)
Potassium: 3.3 mmol/L — ABNORMAL LOW (ref 3.5–5.1)
Sodium: 133 mmol/L — ABNORMAL LOW (ref 135–145)
Total Bilirubin: 1.5 mg/dL — ABNORMAL HIGH (ref 0.0–1.2)
Total Protein: 8.4 g/dL — ABNORMAL HIGH (ref 6.5–8.1)

## 2023-06-17 LAB — LIPASE, BLOOD: Lipase: 20 U/L (ref 11–51)

## 2023-06-17 MED ORDER — ONDANSETRON HCL 4 MG/2ML IJ SOLN: 4.0000 mg | Freq: Once | INTRAMUSCULAR | Status: DC

## 2023-06-17 MED ORDER — KETOROLAC TROMETHAMINE 30 MG/ML IJ SOLN
30.0000 mg | Freq: Once | INTRAMUSCULAR | Status: AC
  Administered 2023-06-17: 30 mg via INTRAVENOUS
  Filled 2023-06-17: qty 1

## 2023-06-17 MED ORDER — SODIUM CHLORIDE 0.9 % IV BOLUS
1000.0000 mL | Freq: Once | INTRAVENOUS | Status: AC
  Administered 2023-06-17: 1000 mL via INTRAVENOUS

## 2023-06-17 MED ORDER — METOCLOPRAMIDE HCL 10 MG PO TABS
10.0000 mg | ORAL_TABLET | Freq: Four times a day (QID) | ORAL | 0 refills | Status: DC
  Filled 2023-06-17: qty 30, 8d supply, fill #0

## 2023-06-17 MED ORDER — DROPERIDOL 2.5 MG/ML IJ SOLN
2.5000 mg | Freq: Once | INTRAMUSCULAR | Status: AC
  Administered 2023-06-17: 2.5 mg via INTRAVENOUS
  Filled 2023-06-17: qty 2

## 2023-06-17 MED ORDER — ONDANSETRON HCL 4 MG/2ML IJ SOLN
4.0000 mg | Freq: Once | INTRAMUSCULAR | Status: AC
Start: 2023-06-17 — End: 2023-06-17
  Administered 2023-06-17: 4 mg via INTRAVENOUS
  Filled 2023-06-17: qty 2

## 2023-06-17 MED ORDER — LOPERAMIDE HCL 2 MG PO TABS
2.0000 mg | ORAL_TABLET | Freq: Four times a day (QID) | ORAL | 0 refills | Status: DC | PRN
  Filled 2023-06-17: qty 24, 6d supply, fill #0
  Filled 2023-06-17: qty 12, 3d supply, fill #0

## 2023-06-17 MED ORDER — PANTOPRAZOLE SODIUM 40 MG IV SOLR
40.0000 mg | Freq: Once | INTRAVENOUS | Status: AC
  Administered 2023-06-17: 40 mg via INTRAVENOUS
  Filled 2023-06-17: qty 10

## 2023-06-17 NOTE — ED Triage Notes (Signed)
 Pt reports NVD, dizziness, chills that started yesterday.  Pt just came from [facility] stated wait was long and left  Pt vomiting in triage

## 2023-06-17 NOTE — Discharge Instructions (Addendum)
 You were seen in the ER for vomiting and diarrhea.  Your blood work was fairly reassuring. We gave you IV fluids and medications for nausea and pain. I have written prescriptions for nausea and diarrhea medicines to your pharmacy.   Continue taking protonix daily as well.   Please follow up with a gastroenterologist.   Continue to monitor how you're doing and return to the ER for new or worsening symptoms.

## 2023-06-17 NOTE — ED Provider Notes (Signed)
 [place] Provider Note   CSN: [id number] Arrival date & time: 06/17/23  1225     History  Chief Complaint  Patient presents with   Emesis    Christine Trujillo is a 28 y.o. female with hx of anxiety, depression, GERD, anemia, ovarian cyst, cannabinoid hyperemesis who presents to the ED complaining of nausea, vomiting, diarrhea and chills starting yesterday. Complaining of burning abdominal pain.    Emesis Associated symptoms: abdominal pain, chills and diarrhea        Home Medications Prior to Admission medications   Medication Sig Start Date End Date Taking? Authorizing Provider  loperamide (IMODIUM A-D) 2 MG tablet Take 1 tablet (2 mg total) by mouth 4 (four) times daily as needed for diarrhea or loose stools. 06/17/23  Yes Claudie Brickhouse T, PA-C  metoCLOPramide (REGLAN) 10 MG tablet Take 1 tablet (10 mg total) by mouth every 6 (six) hours. 06/17/23  Yes Kalanie Fewell T, PA-C  DULoxetine (CYMBALTA) 20 MG capsule Take 1 capsule (20 mg total) by mouth daily. 01/31/23   Sharlene Dory, DO  meloxicam (MOBIC) 15 MG tablet Take 1 tablet (15 mg total) by mouth daily. 04/11/23   Sharlene Dory, DO  Multiple Vitamins-Minerals (MULTIVITAMIN WITH MINERALS) tablet Take 1 tablet by mouth daily.    [provider]  nicotine (NICODERM CQ - DOSED IN MG/24 HR) 7 mg/24hr patch Place 1 patch (7 mg total) onto the skin daily. 03/28/23   Sharlene Dory, DO  pantoprazole (PROTONIX) 40 MG tablet Take 1 tablet (40 mg total) by mouth daily. 01/31/23   Sharlene Dory, DO  potassium chloride (KLOR-CON 10) 10 MEQ tablet Take 2 tablets (20 mEq total) by mouth daily. 02/01/23   Sharlene Dory, DO  promethazine (PHENERGAN) 25 MG suppository Place 1 suppository (25 mg total) rectally every 6 (six) hours as needed for nausea or vomiting. 05/02/23   Wendling, [name], DO  Varenicline Tartrate, Starter,  (CHANTIX STARTING MONTH PAK) 0.5 MG X 11 & 1 MG X 42 TBPK Follow instructions on package. 05/04/23   Sharlene Dory, DO      Allergies    Patient has no known allergies.    Review of Systems   Review of Systems  Constitutional:  Positive for chills.  Gastrointestinal:  Positive for abdominal pain, diarrhea, nausea and vomiting.  All other systems reviewed and are negative.   Physical Exam Updated Vital Signs BP (!) 137/114   Pulse 70   Temp 97.7 F (36.5 C) (Oral)   Resp 20   SpO2 100%  Physical Exam Vitals and nursing note reviewed.  Constitutional:      Appearance: Normal appearance.  HENT:     Head: Normocephalic and atraumatic.  Eyes:     Conjunctiva/sclera: Conjunctivae normal.  Cardiovascular:     Rate and Rhythm: Normal rate and regular rhythm.  Pulmonary:     Effort: Pulmonary effort is normal. No respiratory distress.     Breath sounds: Normal breath sounds.  Abdominal:     General: There is no distension.     Palpations: Abdomen is soft.     Tenderness: There is generalized abdominal tenderness.  Skin:    General: Skin is warm and dry.  Neurological:     General: No focal deficit present.     Mental Status: She is alert.     ED Results / Procedures / Treatments   Labs (all labs ordered are listed, but  only abnormal results are displayed) Labs Reviewed  COMPREHENSIVE METABOLIC PANEL - Abnormal; Notable for the following components:      Result Value   Sodium 133 (*)    Potassium 3.3 (*)    CO2 18 (*)    Glucose, Bld 148 (*)    Total Protein 8.4 (*)    Total Bilirubin 1.5 (*)    All other components within normal limits  CBC - Abnormal; Notable for the following components:   WBC 17.3 (*)    All other components within normal limits  CBG MONITORING, ED - Abnormal; Notable for the following components:   Glucose-Capillary 176 (*)    All other components within normal limits  LIPASE, BLOOD  URINALYSIS, ROUTINE W REFLEX MICROSCOPIC   PREGNANCY, URINE    EKG None  Radiology No results found.  Procedures Procedures    Medications Ordered in ED Medications  droperidol (INAPSINE) 2.5 MG/ML injection 2.5 mg (2.5 mg Intravenous Given 06/17/23 1347)  pantoprazole (PROTONIX) injection 40 mg (40 mg Intravenous Given 06/17/23 1504)  sodium chloride 0.9 % bolus 1,000 mL (0 mLs Intravenous Stopped 06/17/23 1608)  ondansetron (ZOFRAN) injection 4 mg (4 mg Intravenous Given 06/17/23 1608)  ketorolac (TORADOL) 30 MG/ML injection 30 mg (30 mg Intravenous Given 06/17/23 1608)    ED Course/ Medical Decision Making/ A&P                                 Medical Decision Making Amount and/or Complexity of Data Reviewed Labs: ordered.  Risk OTC drugs. Prescription drug management.   This patient is a 28 y.o. female  who presents to the ED for concern of nausea, vomiting, and diarrhea.   Differential diagnoses prior to evaluation: The emergent differential diagnosis includes, but is not limited to,  ACS/MI, Boerhaave's, DKA, elevated ICP, Ischemic bowel, Sepsis, drug-related, Appendicitis, Bowel obstruction, Electrolyte abnormalities, Pancreatitis, Biliary colic, Gastroenteritis, Gastroparesis, Hepatitis, Migraine, Thyroid disease, Renal colic, PUD, UTI, Ovarian torsion, Pregnancy, Hyperemesis gravidarum. This is not an exhaustive differential.   Past Medical History / Co-morbidities / Social History: anxiety, depression, GERD, anemia, ovarian cyst, cannabinoid hyperemesis   Additional history: Chart reviewed. Pertinent results include: Several prior visits for same, most recently in Oct 2024, required admission  Physical Exam: Physical exam performed. The pertinent findings include: Hypertensive, otherwise normal vitals. Actively vomiting. Abdomen soft with generalized tenderness.   Lab Tests/Imaging studies: I personally interpreted labs/imaging and the pertinent results include:  WBC 17.3, potassium 3.3, otherwise labs  unremarkable. Initial glucose 176 but no evidence of DKA. Pt did not provide urine sample before discharge.   With nonsurgical abdominal exam, deferred abdominal imaging at this time.  Cardiac monitoring: EKG obtained and interpreted by myself and attending physician which shows: sinus rhythm   Medications: I ordered medication including toradol, zofran, IVF, protonix, droperidol.  I have reviewed the patients home medicines and have made adjustments as needed.   Disposition: After consideration of the diagnostic results and the patients response to treatment, I feel that emergency department workup does not suggest an emergent condition requiring admission or immediate intervention beyond what has been performed at this time. The plan is: discharge to home. Pt felt improved after medications and felt ready to go home. Sent prescriptions for immodium and reglan as patient already has phenergan suppositories and reports ODT zofran does not work for her. No emergent etiology found for symptoms today.  The patient is safe  for discharge and has been instructed to return immediately for worsening symptoms, change in symptoms or any other concerns.  Final Clinical Impression(s) / ED Diagnoses Final diagnoses:  Hyperemesis  Nausea vomiting and diarrhea    Rx / DC Orders ED Discharge Orders          Ordered    loperamide (IMODIUM A-D) 2 MG tablet  4 times daily PRN        06/17/23 1630    metoCLOPramide (REGLAN) 10 MG tablet  Every 6 hours        06/17/23 1630           Portions of this report may have been transcribed using voice recognition software. Every effort was made to ensure accuracy; however, inadvertent computerized transcription errors may be present.    Jeanella Flattery 06/17/23 1833    Tegeler, Canary Brim, MD 06/17/23 [phone]

## 2023-06-18 ENCOUNTER — Emergency Department (HOSPITAL_BASED_OUTPATIENT_CLINIC_OR_DEPARTMENT_OTHER)
Admission: EM | Admit: 2023-06-18 | Discharge: 2023-06-18 | Disposition: A | Discharge status: Home / Self Care | Payer: MEDICAID | Attending: Emergency Medicine | Admitting: Emergency Medicine

## 2023-06-18 ENCOUNTER — Emergency Department (HOSPITAL_BASED_OUTPATIENT_CLINIC_OR_DEPARTMENT_OTHER): Payer: MEDICAID

## 2023-06-18 ENCOUNTER — Encounter (HOSPITAL_BASED_OUTPATIENT_CLINIC_OR_DEPARTMENT_OTHER): Payer: Self-pay | Admitting: Urology

## 2023-06-18 DIAGNOSIS — R197 Diarrhea, unspecified: Secondary | ICD-10-CM | POA: Diagnosis not present

## 2023-06-18 DIAGNOSIS — R6883 Chills (without fever): Secondary | ICD-10-CM | POA: Insufficient documentation

## 2023-06-18 DIAGNOSIS — R112 Nausea with vomiting, unspecified: Secondary | ICD-10-CM | POA: Diagnosis present

## 2023-06-18 DIAGNOSIS — E876 Hypokalemia: Secondary | ICD-10-CM | POA: Diagnosis not present

## 2023-06-18 DIAGNOSIS — R1084 Generalized abdominal pain: Secondary | ICD-10-CM | POA: Diagnosis not present

## 2023-06-18 LAB — CBC WITH DIFFERENTIAL/PLATELET
Abs Immature Granulocytes: 0.06 10*3/uL (ref 0.00–0.07)
Basophils Absolute: 0 10*3/uL (ref 0.0–0.1)
Basophils Relative: 0 %
Eosinophils Absolute: 0.1 10*3/uL (ref 0.0–0.5)
Eosinophils Relative: 1 %
HCT: 39.7 % (ref 36.0–46.0)
Hemoglobin: 13.6 g/dL (ref 12.0–15.0)
Immature Granulocytes: 0 %
Lymphocytes Relative: 8 %
Lymphs Abs: 1.4 10*3/uL (ref 0.7–4.0)
MCH: 28.5 pg (ref 26.0–34.0)
MCHC: 34.3 g/dL (ref 30.0–36.0)
MCV: 83.1 fL (ref 80.0–100.0)
Monocytes Absolute: 1.1 10*3/uL — ABNORMAL HIGH (ref 0.1–1.0)
Monocytes Relative: 6 %
Neutro Abs: 15.5 10*3/uL — ABNORMAL HIGH (ref 1.7–7.7)
Neutrophils Relative %: 85 %
Platelets: 308 10*3/uL (ref 150–400)
RBC: 4.78 MIL/uL (ref 3.87–5.11)
RDW: 12.9 % (ref 11.5–15.5)
WBC: 18.2 10*3/uL — ABNORMAL HIGH (ref 4.0–10.5)
nRBC: 0 % (ref 0.0–0.2)

## 2023-06-18 LAB — COMPREHENSIVE METABOLIC PANEL
ALT: 22 U/L (ref 0–44)
AST: 39 U/L (ref 15–41)
Albumin: 4.2 g/dL (ref 3.5–5.0)
Alkaline Phosphatase: 72 U/L (ref 38–126)
Anion gap: 14 (ref 5–15)
BUN: 8 mg/dL (ref 6–20)
CO2: 18 mmol/L — ABNORMAL LOW (ref 22–32)
Calcium: 9.1 mg/dL (ref 8.9–10.3)
Chloride: 99 mmol/L (ref 98–111)
Creatinine, Ser: 0.81 mg/dL (ref 0.44–1.00)
GFR, Estimated: >60 mL/min (ref >60)
Glucose, Bld: 113 mg/dL — ABNORMAL HIGH (ref 70–99)
Potassium: 3.2 mmol/L — ABNORMAL LOW (ref 3.5–5.1)
Sodium: 131 mmol/L — ABNORMAL LOW (ref 135–145)
Total Bilirubin: 1.3 mg/dL — ABNORMAL HIGH (ref 0.0–1.2)
Total Protein: 8.1 g/dL (ref 6.5–8.1)

## 2023-06-18 LAB — LIPASE, BLOOD: Lipase: 20 U/L (ref 11–51)

## 2023-06-18 LAB — HCG, SERUM, QUALITATIVE: Preg, Serum: NEGATIVE

## 2023-06-18 MED ORDER — ONDANSETRON 4 MG PO TBDP: 4.0000 mg | ORAL_TABLET | Freq: Three times a day (TID) | ORAL | 0 refills | Status: DC | PRN

## 2023-06-18 MED ORDER — DROPERIDOL 2.5 MG/ML IJ SOLN
1.2500 mg | Freq: Once | INTRAMUSCULAR | Status: AC
  Administered 2023-06-18: 1.25 mg via INTRAVENOUS
  Filled 2023-06-18: qty 2

## 2023-06-18 MED ORDER — SODIUM CHLORIDE 0.9 % IV BOLUS
1000.0000 mL | Freq: Once | INTRAVENOUS | Status: AC
  Administered 2023-06-18: 1000 mL via INTRAVENOUS

## 2023-06-18 MED ORDER — ONDANSETRON HCL 4 MG/2ML IJ SOLN
4.0000 mg | Freq: Once | INTRAMUSCULAR | Status: AC
  Administered 2023-06-18: 4 mg via INTRAVENOUS
  Filled 2023-06-18: qty 2

## 2023-06-18 MED ORDER — KETOROLAC TROMETHAMINE 15 MG/ML IJ SOLN
15.0000 mg | Freq: Once | INTRAMUSCULAR | Status: AC
  Administered 2023-06-18: 15 mg via INTRAVENOUS
  Filled 2023-06-18: qty 1

## 2023-06-18 MED ORDER — MORPHINE SULFATE (PF) 4 MG/ML IV SOLN
4.0000 mg | Freq: Once | INTRAVENOUS | Status: AC
  Administered 2023-06-18: 4 mg via INTRAVENOUS
  Filled 2023-06-18: qty 1

## 2023-06-18 MED ORDER — IOHEXOL 300 MG/ML  SOLN
100.0000 mL | Freq: Once | INTRAMUSCULAR | Status: AC | PRN
  Administered 2023-06-18: 100 mL via INTRAVENOUS

## 2023-06-18 MED ORDER — DIPHENHYDRAMINE HCL 50 MG/ML IJ SOLN
12.5000 mg | Freq: Once | INTRAMUSCULAR | Status: AC
  Administered 2023-06-18: 12.5 mg via INTRAVENOUS
  Filled 2023-06-18: qty 1

## 2023-06-18 NOTE — ED Provider Notes (Signed)
 [facility] Provider Note   CSN: [id number] Arrival date & time: 06/18/23  0008     History  Chief Complaint  Patient presents with   Emesis   Diarrhea    Christine Trujillo is a 28 y.o. female.  The history is provided by the patient and medical records.  Emesis Associated symptoms: diarrhea   Diarrhea Associated symptoms: vomiting   [name] is a 28 y.o. female who presents to the Emergency Department complaining of AP, V/D.  Presents to the emergency department for evaluation of symptoms that started Thursday with nausea, vomiting, diarrhea and generalized abdominal pain.  Christine Trujillo has chills but no fever.  No dysuria.  Christine Trujillo has multiple sick contacts at home with similar symptoms.  Christine Trujillo was recently seen in the emergency department and returned after her symptoms returned.  Christine Trujillo continues to feel unwell.  Christine Trujillo does use marijuana but last use was on Monday.  No tobacco, alcohol use.     Home Medications Prior to Admission medications   Medication Sig Start Date End Date Taking? Authorizing Provider  ondansetron (ZOFRAN-ODT) 4 MG disintegrating tablet Take 1 tablet (4 mg total) by mouth every 8 (eight) hours as needed. 06/18/23  Yes Tilden Fossa, MD  DULoxetine (CYMBALTA) 20 MG capsule Take 1 capsule (20 mg total) by mouth daily. 01/31/23   Sharlene Dory, DO  loperamide (IMODIUM A-D) 2 MG tablet Take 1 tablet (2 mg total) by mouth 4 (four) times daily as needed for diarrhea or loose stools. 06/17/23   Roemhildt, Lorin T, PA-C  meloxicam (MOBIC) 15 MG tablet Take 1 tablet (15 mg total) by mouth daily. 04/11/23   Sharlene Dory, DO  metoCLOPramide (REGLAN) 10 MG tablet Take 1 tablet (10 mg total) by mouth every 6 (six) hours. 06/17/23   Roemhildt, Lorin T, PA-C  Multiple Vitamins-Minerals (MULTIVITAMIN WITH MINERALS) tablet Take 1 tablet by mouth daily.    [provider]  nicotine (NICODERM CQ - DOSED IN MG/24  HR) 7 mg/24hr patch Place 1 patch (7 mg total) onto the skin daily. 03/28/23   Sharlene Dory, DO  pantoprazole (PROTONIX) 40 MG tablet Take 1 tablet (40 mg total) by mouth daily. 01/31/23   Sharlene Dory, DO  potassium chloride (KLOR-CON 10) 10 MEQ tablet Take 2 tablets (20 mEq total) by mouth daily. 02/01/23   Sharlene Dory, DO  promethazine (PHENERGAN) 25 MG suppository Place 1 suppository (25 mg total) rectally every 6 (six) hours as needed for nausea or vomiting. 05/02/23   Wendling, [name], DO  Varenicline Tartrate, Starter, (CHANTIX STARTING MONTH PAK) 0.5 MG X 11 & 1 MG X 42 TBPK Follow instructions on package. 05/04/23   Sharlene Dory, DO      Allergies    Patient has no known allergies.    Review of Systems   Review of Systems  Gastrointestinal:  Positive for diarrhea and vomiting.  All other systems reviewed and are negative.   Physical Exam Updated Vital Signs BP (!) 103/58   Pulse 62   Temp 97.9 F (36.6 C) (Oral)   Resp 15   Ht 5\' 3"  (1.6 m)   Wt 109.3 kg   SpO2 98%   BMI 42.68 kg/m  Physical Exam Vitals and nursing note reviewed.  Constitutional:      Appearance: Christine Trujillo is well-developed.  HENT:     Head: Normocephalic and atraumatic.  Cardiovascular:     Rate and Rhythm: Normal  rate and regular rhythm.     Heart sounds: No murmur heard. Pulmonary:     Effort: Pulmonary effort is normal. No respiratory distress.     Breath sounds: Normal breath sounds.  Abdominal:     Palpations: Abdomen is soft.     Tenderness: There is no abdominal tenderness. There is no guarding or rebound.  Musculoskeletal:        General: No swelling or tenderness.  Skin:    General: Skin is warm and dry.  Neurological:     Mental Status: Christine Trujillo is alert and oriented to person, place, and time.  Psychiatric:        Behavior: Behavior normal.     ED Results / Procedures / Treatments   Labs (all labs ordered are listed, but only abnormal  results are displayed) Labs Reviewed  COMPREHENSIVE METABOLIC PANEL - Abnormal; Notable for the following components:      Result Value   Sodium 131 (*)    Potassium 3.2 (*)    CO2 18 (*)    Glucose, Bld 113 (*)    Total Bilirubin 1.3 (*)    All other components within normal limits  CBC WITH DIFFERENTIAL/PLATELET - Abnormal; Notable for the following components:   WBC 18.2 (*)    Neutro Abs 15.5 (*)    Monocytes Absolute 1.1 (*)    All other components within normal limits  LIPASE, BLOOD  HCG, SERUM, QUALITATIVE    EKG None  Radiology CT ABDOMEN PELVIS W CONTRAST Result Date: 06/18/2023 CLINICAL DATA:  Abdominal pain, acute, nonlocalized. Also with nausea, vomiting and diarrhea. EXAM: CT ABDOMEN AND PELVIS WITH CONTRAST TECHNIQUE: Multidetector CT imaging of the abdomen and pelvis was performed using the standard protocol following bolus administration of intravenous contrast. RADIATION DOSE REDUCTION: This exam was performed according to the departmental dose-optimization program which includes automated exposure control, adjustment of the mA and/or kV according to patient size and/or use of iterative reconstruction technique. CONTRAST:  OMNIPAQUE IOHEXOL 300 MG/ML  SOLN COMPARISON:  CT with IV contrast 01/28/2023 and 12/23/2022 FINDINGS: Lower chest: No acute abnormality. Hepatobiliary: No focal liver abnormality is seen. The liver is 20 cm length with slight steatosis. Status post cholecystectomy. No biliary dilatation. Pancreas: No abnormality. Spleen: No abnormality.  No splenomegaly. Adrenals/Urinary Tract: Adrenal glands are unremarkable. Kidneys are normal, without renal calculi, focal lesion, or hydronephrosis. Bladder is unremarkable. Stomach/Bowel: No dilatation or wall thickening including the appendix. Scattered uncomplicated sigmoid diverticulosis. Vascular/Lymphatic: No significant vascular findings are present. No enlarged abdominal or pelvic lymph nodes. Reproductive:  Uterus and bilateral adnexa are unremarkable. Other: No abdominal wall hernia or abnormality. No abdominopelvic ascites. Musculoskeletal: No acute or significant osseous findings. IMPRESSION: 1. No acute CT findings in the abdomen or pelvis. 2. Hepatomegaly with slight steatosis. No splenomegaly or portal vein dilatation. 3. Scattered uncomplicated sigmoid diverticulosis. Electronically Signed   By: Almira Bar M.D.   On: 06/18/2023 04:10    Procedures Procedures    Medications Ordered in ED Medications  sodium chloride 0.9 % bolus 1,000 mL (0 mLs Intravenous Stopped 06/18/23 0310)  ondansetron (ZOFRAN) injection 4 mg (4 mg Intravenous Given 06/18/23 0155)  ketorolac (TORADOL) 15 MG/ML injection 15 mg (15 mg Intravenous Given 06/18/23 0204)  morphine (PF) 4 MG/ML injection 4 mg (4 mg Intravenous Given 06/18/23 0303)  droperidol (INAPSINE) 2.5 MG/ML injection 1.25 mg (1.25 mg Intravenous Given 06/18/23 0304)  diphenhydrAMINE (BENADRYL) injection 12.5 mg (12.5 mg Intravenous Given 06/18/23 0306)  iohexol (OMNIPAQUE)  300 MG/ML solution 100 mL (100 mLs Intravenous Contrast Given 06/18/23 0321)    ED Course/ Medical Decision Making/ A&P                                 Medical Decision Making Amount and/or Complexity of Data Reviewed Labs: ordered. Radiology: ordered.  Risk Prescription drug management.   Patient here for evaluation of vomiting, diarrhea and abdominal pain, does have sick contacts in the home.  Christine Trujillo was evaluated earlier in the day and returns due to worsening symptoms.  CBC with increasing leukocytosis, does have mild hypokalemia.  Given her worsening pain, increased WBC a CT abdomen pelvis was obtained.  CT is negative for acute abnormality.  Discussed with patient incidental findings of diverticulosis, hepatic steatosis.  Current clinical picture is not consistent with cholecystitis, pancreatitis, appendicitis.  After medications in the emergency department Christine Trujillo is feeling improved  and able to tolerate p.o.  Feel Christine Trujillo is stable for discharge home on oral medications with outpatient follow-up and return precautions.        Final Clinical Impression(s) / ED Diagnoses Final diagnoses:  Nausea vomiting and diarrhea  Hypokalemia    Rx / DC Orders ED Discharge Orders          Ordered    ondansetron (ZOFRAN-ODT) 4 MG disintegrating tablet  Every 8 hours PRN        06/18/23 0426              Tilden Fossa, MD 06/18/23 0451

## 2023-06-18 NOTE — Discharge Instructions (Addendum)
 You had a CT scan today in the Emergency Department that showed fatty changes to your liver and diverticulosis.  Please follow up with your family doctor for recheck.

## 2023-06-18 NOTE — ED Triage Notes (Signed)
 Pt states continued N/V/D that started 2 days  ago, was seen here for same  Stated getting worse again approx 1 hr ago

## 2023-06-20 ENCOUNTER — Telehealth: Payer: Self-pay | Admitting: Family Medicine

## 2023-06-20 ENCOUNTER — Ambulatory Visit: Payer: MEDICAID | Admitting: Family Medicine

## 2023-06-20 NOTE — Telephone Encounter (Signed)
 Copied from CRM [id number]. Topic: General - Other >> Jun 20, 2023  8:20 AM Gurney Maxin H wrote: Reason for CRM: Patients boyfriend calling to notify office that patient is in the hospital admitted on Saturday for nausea, vomiting, diarrhea and stomach cramping

## 2023-06-23 ENCOUNTER — Emergency Department (HOSPITAL_BASED_OUTPATIENT_CLINIC_OR_DEPARTMENT_OTHER)
Admission: EM | Admit: 2023-06-23 | Discharge: 2023-06-23 | Disposition: A | Discharge status: Home / Self Care | Payer: MEDICAID | Attending: Emergency Medicine | Admitting: Emergency Medicine

## 2023-06-23 ENCOUNTER — Other Ambulatory Visit (HOSPITAL_BASED_OUTPATIENT_CLINIC_OR_DEPARTMENT_OTHER): Payer: Self-pay

## 2023-06-23 ENCOUNTER — Encounter (HOSPITAL_BASED_OUTPATIENT_CLINIC_OR_DEPARTMENT_OTHER): Payer: Self-pay | Admitting: Emergency Medicine

## 2023-06-23 ENCOUNTER — Other Ambulatory Visit: Payer: Self-pay

## 2023-06-23 DIAGNOSIS — R1013 Epigastric pain: Secondary | ICD-10-CM

## 2023-06-23 DIAGNOSIS — Z79899 Other long term (current) drug therapy: Secondary | ICD-10-CM | POA: Insufficient documentation

## 2023-06-23 DIAGNOSIS — R197 Diarrhea, unspecified: Secondary | ICD-10-CM | POA: Insufficient documentation

## 2023-06-23 DIAGNOSIS — R112 Nausea with vomiting, unspecified: Secondary | ICD-10-CM | POA: Insufficient documentation

## 2023-06-23 DIAGNOSIS — D72829 Elevated white blood cell count, unspecified: Secondary | ICD-10-CM | POA: Insufficient documentation

## 2023-06-23 LAB — COMPREHENSIVE METABOLIC PANEL
ALT: 35 U/L (ref 0–44)
AST: 26 U/L (ref 15–41)
Albumin: 4.4 g/dL (ref 3.5–5.0)
Alkaline Phosphatase: 70 U/L (ref 38–126)
Anion gap: 14 (ref 5–15)
BUN: 8 mg/dL (ref 6–20)
CO2: 19 mmol/L — ABNORMAL LOW (ref 22–32)
Calcium: 9.2 mg/dL (ref 8.9–10.3)
Chloride: 99 mmol/L (ref 98–111)
Creatinine, Ser: 0.76 mg/dL (ref 0.44–1.00)
GFR, Estimated: >60 mL/min (ref >60)
Glucose, Bld: 119 mg/dL — ABNORMAL HIGH (ref 70–99)
Potassium: 2.9 mmol/L — ABNORMAL LOW (ref 3.5–5.1)
Sodium: 132 mmol/L — ABNORMAL LOW (ref 135–145)
Total Bilirubin: 1 mg/dL (ref 0.0–1.2)
Total Protein: 7.9 g/dL (ref 6.5–8.1)

## 2023-06-23 LAB — PREGNANCY, URINE: Preg Test, Ur: NEGATIVE

## 2023-06-23 LAB — CBC WITH DIFFERENTIAL/PLATELET
Abs Immature Granulocytes: 0.05 10*3/uL (ref 0.00–0.07)
Basophils Absolute: 0 10*3/uL (ref 0.0–0.1)
Basophils Relative: 0 %
Eosinophils Absolute: 0 10*3/uL (ref 0.0–0.5)
Eosinophils Relative: 0 %
HCT: 41.2 % (ref 36.0–46.0)
Hemoglobin: 14.1 g/dL (ref 12.0–15.0)
Immature Granulocytes: 0 %
Lymphocytes Relative: 11 %
Lymphs Abs: 1.5 10*3/uL (ref 0.7–4.0)
MCH: 28.4 pg (ref 26.0–34.0)
MCHC: 34.2 g/dL (ref 30.0–36.0)
MCV: 82.9 fL (ref 80.0–100.0)
Monocytes Absolute: 0.7 10*3/uL (ref 0.1–1.0)
Monocytes Relative: 5 %
Neutro Abs: 11.5 10*3/uL — ABNORMAL HIGH (ref 1.7–7.7)
Neutrophils Relative %: 84 %
Platelets: 318 10*3/uL (ref 150–400)
RBC: 4.97 MIL/uL (ref 3.87–5.11)
RDW: 13.2 % (ref 11.5–15.5)
WBC: 13.6 10*3/uL — ABNORMAL HIGH (ref 4.0–10.5)
nRBC: 0 % (ref 0.0–0.2)

## 2023-06-23 LAB — URINALYSIS, ROUTINE W REFLEX MICROSCOPIC
Bilirubin Urine: NEGATIVE
Glucose, UA: NEGATIVE mg/dL
Ketones, ur: NEGATIVE mg/dL
Leukocytes,Ua: NEGATIVE
Nitrite: NEGATIVE
Protein, ur: NEGATIVE mg/dL
Specific Gravity, Urine: 1.02 (ref 1.005–1.030)
pH: 8.5 — ABNORMAL HIGH (ref 5.0–8.0)

## 2023-06-23 LAB — URINALYSIS, MICROSCOPIC (REFLEX)

## 2023-06-23 LAB — MAGNESIUM: Magnesium: 1.8 mg/dL (ref 1.7–2.4)

## 2023-06-23 LAB — LIPASE, BLOOD: Lipase: 27 U/L (ref 11–51)

## 2023-06-23 MED ORDER — POTASSIUM CHLORIDE 10 MEQ/100ML IV SOLN
10.0000 meq | INTRAVENOUS | Status: AC
  Administered 2023-06-23 (×2): 10 meq via INTRAVENOUS
  Filled 2023-06-23 (×2): qty 100

## 2023-06-23 MED ORDER — DROPERIDOL 2.5 MG/ML IJ SOLN
1.2500 mg | Freq: Once | INTRAMUSCULAR | Status: AC
  Administered 2023-06-23: 1.25 mg via INTRAVENOUS
  Filled 2023-06-23: qty 2

## 2023-06-23 MED ORDER — PROCHLORPERAZINE EDISYLATE 10 MG/2ML IJ SOLN: 10.0000 mg | Freq: Once | INTRAMUSCULAR | Status: DC

## 2023-06-23 MED ORDER — MAGNESIUM SULFATE 2 GM/50ML IV SOLN
2.0000 g | Freq: Once | INTRAVENOUS | Status: AC
  Administered 2023-06-23: 2 g via INTRAVENOUS
  Filled 2023-06-23: qty 50

## 2023-06-23 MED ORDER — FAMOTIDINE 20 MG PO TABS
20.0000 mg | ORAL_TABLET | Freq: Two times a day (BID) | ORAL | 0 refills | Status: DC
  Filled 2023-06-23: qty 30, 15d supply, fill #0

## 2023-06-23 MED ORDER — KETOROLAC TROMETHAMINE 30 MG/ML IJ SOLN
15.0000 mg | Freq: Once | INTRAMUSCULAR | Status: AC
  Administered 2023-06-23: 15 mg via INTRAVENOUS
  Filled 2023-06-23: qty 1

## 2023-06-23 MED ORDER — FAMOTIDINE IN NACL 20-0.9 MG/50ML-% IV SOLN
20.0000 mg | Freq: Once | INTRAVENOUS | Status: AC
  Administered 2023-06-23: 20 mg via INTRAVENOUS
  Filled 2023-06-23: qty 50

## 2023-06-23 MED ORDER — SODIUM CHLORIDE 0.9 % IV BOLUS
1000.0000 mL | Freq: Once | INTRAVENOUS | Status: AC
  Administered 2023-06-23: 1000 mL via INTRAVENOUS

## 2023-06-23 MED ORDER — PROMETHAZINE HCL 25 MG RE SUPP
25.0000 mg | Freq: Four times a day (QID) | RECTAL | 0 refills | Status: DC | PRN
  Filled 2023-06-23: qty 12, 3d supply, fill #0

## 2023-06-23 MED ORDER — ONDANSETRON 4 MG PO TBDP
4.0000 mg | ORAL_TABLET | Freq: Once | ORAL | Status: AC | PRN
  Administered 2023-06-23: 4 mg via ORAL
  Filled 2023-06-23: qty 1

## 2023-06-23 MED ORDER — POTASSIUM CHLORIDE 10 MEQ/100ML IV SOLN
10.0000 meq | Freq: Once | INTRAVENOUS | Status: DC
Start: 2023-06-23 — End: 2023-06-23

## 2023-06-23 MED ORDER — ALUM & MAG HYDROXIDE-SIMETH 200-200-20 MG/5ML PO SUSP
30.0000 mL | Freq: Once | ORAL | Status: AC
  Administered 2023-06-23: 30 mL via ORAL
  Filled 2023-06-23: qty 30

## 2023-06-23 MED ORDER — ONDANSETRON 4 MG PO TBDP
4.0000 mg | ORAL_TABLET | Freq: Three times a day (TID) | ORAL | 0 refills | Status: DC | PRN
  Filled 2023-06-23: qty 12, 4d supply, fill #0

## 2023-06-23 NOTE — ED Provider Notes (Signed)
 [place] [facility] Provider Note   CSN: [id number] Arrival date & time: 06/23/23  0844     History  Chief Complaint  Patient presents with   Emesis    Christine Trujillo is a 28 y.o. female.  Christine Trujillo is a 28 y.o. female who presents to the ED with nausea vomiting and diarrhea.  Patient has extensive history of cannabinoid hyperemesis syndrome and has been seen numerous times in the ED for persistent nausea and vomiting.  Recently had hospitalization for intractable nausea and vomiting and was also found to have norovirus infection.  Was discharged from the hospital 3 days ago and reports nausea and vomiting started again yesterday after eating spaghetti. She has been unable to stop vomiting or keep anything down.  She denies any blood in her vomit or stool.  Reports diffuse abdominal pain more severe in the epigastric region.  She denies smoking marijuana since recent hospitalization.  Reports that she has been using Zofran at home without improvement.  The history is provided by the patient and medical records.  Emesis Associated symptoms: abdominal pain and diarrhea   Associated symptoms: no chills and no fever        Home Medications Prior to Admission medications   Medication Sig Start Date End Date Taking? Authorizing Provider  DULoxetine (CYMBALTA) 20 MG capsule Take 1 capsule (20 mg total) by mouth daily. 01/31/23   Sharlene Dory, DO  loperamide (IMODIUM A-D) 2 MG tablet Take 1 tablet (2 mg total) by mouth 4 (four) times daily as needed for diarrhea or loose stools. 06/17/23   Roemhildt, Lorin T, PA-C  meloxicam (MOBIC) 15 MG tablet Take 1 tablet (15 mg total) by mouth daily. 04/11/23   Sharlene Dory, DO  metoCLOPramide (REGLAN) 10 MG tablet Take 1 tablet (10 mg total) by mouth every 6 (six) hours. 06/17/23   Roemhildt, Lorin T, PA-C  Multiple Vitamins-Minerals (MULTIVITAMIN WITH MINERALS) tablet Take 1 tablet  by mouth daily.    [provider]  nicotine (NICODERM CQ - DOSED IN MG/24 HR) 7 mg/24hr patch Place 1 patch (7 mg total) onto the skin daily. 03/28/23   Sharlene Dory, DO  ondansetron (ZOFRAN-ODT) 4 MG disintegrating tablet Take 1 tablet (4 mg total) by mouth every 8 (eight) hours as needed. 06/18/23   Tilden Fossa, MD  pantoprazole (PROTONIX) 40 MG tablet Take 1 tablet (40 mg total) by mouth daily. 01/31/23   Sharlene Dory, DO  potassium chloride (KLOR-CON 10) 10 MEQ tablet Take 2 tablets (20 mEq total) by mouth daily. 02/01/23   Sharlene Dory, DO  promethazine (PHENERGAN) 25 MG suppository Place 1 suppository (25 mg total) rectally every 6 (six) hours as needed for nausea or vomiting. 05/02/23   Wendling, [name], DO  Varenicline Tartrate, Starter, (CHANTIX STARTING MONTH PAK) 0.5 MG X 11 & 1 MG X 42 TBPK Follow instructions on package. 05/04/23   Sharlene Dory, DO      Allergies    Patient has no known allergies.    Review of Systems   Review of Systems  Constitutional:  Negative for chills and fever.  Gastrointestinal:  Positive for abdominal pain, diarrhea, nausea and vomiting. Negative for blood in stool.    Physical Exam Updated Vital Signs There were no vitals taken for this visit. Physical Exam Vitals and nursing note reviewed.  Constitutional:      Appearance: Normal appearance. She is well-developed. She is not diaphoretic.  Comments: Patient is laying on her side, some dry heaving but not currently vomiting, appears uncomfortable but is in no acute distress.  HENT:     Head: Normocephalic and atraumatic.     Mouth/Throat:     Comments: Despite ongoing vomiting patient's mucous membranes are moist, oropharynx is clear. Eyes:     General:        Right eye: No discharge.        Left eye: No discharge.     Pupils: Pupils are equal, round, and reactive to light.  Cardiovascular:     Rate and Rhythm: Normal rate and  regular rhythm.     Pulses: Normal pulses.     Heart sounds: Normal heart sounds.  Pulmonary:     Effort: Pulmonary effort is normal. No respiratory distress.     Breath sounds: Normal breath sounds. No wheezing or rales.     Comments: Respirations equal and unlabored, patient able to speak in full sentences, lungs clear to auscultation bilaterally  Abdominal:     General: Bowel sounds are normal. There is no distension.     Palpations: Abdomen is soft. There is no mass.     Tenderness: There is abdominal tenderness. There is no guarding.     Comments: Abdomen soft, nondistended, generalized tenderness noted worse in the epigastric region without guarding or rebound tenderness, abdominal exam is improved with distraction.  Musculoskeletal:        General: No deformity.     Cervical back: Neck supple.  Skin:    General: Skin is warm and dry.     Capillary Refill: Capillary refill takes less than 2 seconds.  Neurological:     Mental Status: She is alert and oriented to person, place, and time.     Coordination: Coordination normal.     Comments: Speech is clear, able to follow commands Moves extremities without ataxia, coordination intact  Psychiatric:        Mood and Affect: Mood normal.        Behavior: Behavior normal.     ED Results / Procedures / Treatments   Labs (all labs ordered are listed, but only abnormal results are displayed) Labs Reviewed  COMPREHENSIVE METABOLIC PANEL - Abnormal; Notable for the following components:      Result Value   Sodium 132 (*)    Potassium 2.9 (*)    CO2 19 (*)    Glucose, Bld 119 (*)    All other components within normal limits  CBC WITH DIFFERENTIAL/PLATELET - Abnormal; Notable for the following components:   WBC 13.6 (*)    Neutro Abs 11.5 (*)    All other components within normal limits  URINALYSIS, ROUTINE W REFLEX MICROSCOPIC - Abnormal; Notable for the following components:   APPearance HAZY (*)    pH 8.5 (*)    Hgb urine  dipstick MODERATE (*)    All other components within normal limits  URINALYSIS, MICROSCOPIC (REFLEX) - Abnormal; Notable for the following components:   Bacteria, UA FEW (*)    All other components within normal limits  LIPASE, BLOOD  PREGNANCY, URINE  MAGNESIUM    EKG EKG Interpretation Date/Time:  Thursday June 23 2023 09:49:20 EDT Ventricular Rate:  71 PR Interval:  129 QRS Duration:  95 QT Interval:  465 QTC Calculation: 506 R Axis:   59  Text Interpretation: Sinus rhythm Borderline T abnormalities, anterior leads Prolonged QT interval Otherwise no significant change Confirmed by Elayne Snare ([id number]) on 06/23/2023 11:23:38 AM  Radiology No results found.  Procedures Procedures    Medications Ordered in ED Medications  sodium chloride 0.9 % bolus 1,000 mL (0 mLs Intravenous Stopped 06/23/23 1244)  famotidine (PEPCID) IVPB 20 mg premix (0 mg Intravenous Stopped 06/23/23 1058)  droperidol (INAPSINE) 2.5 MG/ML injection 1.25 mg (1.25 mg Intravenous Given 06/23/23 1009)  magnesium sulfate IVPB 2 g 50 mL (0 g Intravenous Stopped 06/23/23 1106)  potassium chloride 10 mEq in 100 mL IVPB (0 mEq Intravenous Stopped 06/23/23 1402)  ketorolac (TORADOL) 30 MG/ML injection 15 mg (15 mg Intravenous Given 06/23/23 1215)  alum & mag hydroxide-simeth (MAALOX/MYLANTA) 200-200-20 MG/5ML suspension 30 mL (30 mLs Oral Given 06/23/23 1214)    ED Course/ Medical Decision Making/ A&P                                 Medical Decision Making Amount and/or Complexity of Data Reviewed Labs: ordered.  Risk OTC drugs. Prescription drug management.    28 year old female presenting with ongoing nausea vomiting and diarrhea.  Recently admitted to the hospital for intractable vomiting in setting of cannabinoid hyperemesis syndrome and norovirus infection.  Symptoms returned after trying to eat spaghetti.  Patient with mild QT prolongation on EKG but otherwise normal sinus rhythm.  IV potassium  and magnesium replacement given in the setting of QT prolongation.  Patient has responded well to low-dose droperidol in the past for intractable vomiting.  Will give this as well as IV fluid bolus and IV Pepcid  Laboratory evaluation shows some hypokalemia with potassium of 2.9, not unexpected in the setting of ongoing fluid losses, sodium of 132, CO2 of 19, creatinine and BUN are normal fortunately, normal LFTs and lipase.  White count of 13.6 not unexpected and ongoing vomiting, normal hemoglobin, pregnancy negative, urinalysis with moderate hemoglobin, no signs of infection.  After medications vomiting has stopped, patient reports some ongoing pain, given Toradol and Maalox which improved symptoms.  Patient tolerating p.o. with no further vomiting in the ED.  Potassium and magnesium replacement given.   I had extensive conversation with patient at bedside about starting with clear liquids and then eating small amounts of bland foods for the next several days and not returning to her regular diet and avoiding things such as spaghetti which are more likely to restart her cyclical vomiting.  Given prescriptions for ODT Zofran and Phenergan suppositories to help manage nausea and vomiting.  Stressed the importance of taking these medications routinely over the next 2 days and then as needed as soon as nausea is experienced rather than waiting for vomiting to start.  Stressed the importance of continuing to avoid marijuana and close follow-up.  Return precautions discussed.  Offered 1 additional dose of IV antiemetic but patient is requesting discharge.  Discharged papers provided.         Final Clinical Impression(s) / ED Diagnoses Final diagnoses:  Nausea and vomiting, unspecified vomiting type  Epigastric pain    Rx / DC Orders ED Discharge Orders          Ordered    promethazine (PHENERGAN) 25 MG suppository  Every 6 hours PRN        06/23/23 1405    ondansetron (ZOFRAN-ODT) 4 MG  disintegrating tablet  Every 8 hours PRN,   Status:  Discontinued        06/23/23 1405    famotidine (PEPCID) 20 MG tablet  2 times daily  06/23/23 1414              Rosezella Rumpf, PA-C 06/30/23 1610    Geoffery Lyons, MD 07/09/23 [phone]

## 2023-06-23 NOTE — ED Notes (Signed)
 Pt calling out numerous times on the call light, every 60 seconds demanding to be discharged.  PA notified and states to cancel the compazine IV order

## 2023-06-23 NOTE — ED Triage Notes (Signed)
 Pt POV steady gait- c/o emesis, diarrhea, bloody stool.  Seen here this AM for same. Reports unable to keep food/liquids down.   Pt also with armband from another ED.

## 2023-06-23 NOTE — ED Triage Notes (Signed)
 Was just seen for same has norovirus still vomitng a lot

## 2023-06-23 NOTE — Discharge Instructions (Addendum)
 Your vomiting and abdominal pain can be related to recent norovirus infection but could also be due to cannabinoid hyperemesis syndrome.  Symptoms can persist for months even after stopping marijuana use.  Follow-up closely with your primary care doctor.  Use prescribed Zofran sublingual tablets 1 tablet every 6 hours and use Phenergan suppositories for breakthrough nausea and vomiting.  Stick with clear liquids for the next day focusing on small frequent sips rather than the large volume.  Then tomorrow can start with small quantities of very bland foods.  Should stick to a bland diet for the next several days until you are feeling back to baseline.  You can use prescribed Pepcid twice daily to help reduce stomach pain and irritation as well.  Return to the ED for persistent vomiting not managed by antiemetics, worsening pain or other new or concerning symptoms.

## 2023-06-24 ENCOUNTER — Emergency Department (HOSPITAL_BASED_OUTPATIENT_CLINIC_OR_DEPARTMENT_OTHER)
Admission: EM | Admit: 2023-06-24 | Discharge: 2023-06-24 | Disposition: A | Discharge status: Home / Self Care | Payer: MEDICAID | Source: Home / Self Care | Attending: Emergency Medicine | Admitting: Emergency Medicine

## 2023-06-24 ENCOUNTER — Other Ambulatory Visit: Payer: Self-pay | Admitting: Family Medicine

## 2023-06-24 ENCOUNTER — Other Ambulatory Visit (HOSPITAL_BASED_OUTPATIENT_CLINIC_OR_DEPARTMENT_OTHER): Payer: Self-pay

## 2023-06-24 DIAGNOSIS — R112 Nausea with vomiting, unspecified: Secondary | ICD-10-CM

## 2023-06-24 LAB — CBC WITH DIFFERENTIAL/PLATELET
Abs Immature Granulocytes: 0.07 10*3/uL (ref 0.00–0.07)
Basophils Absolute: 0 10*3/uL (ref 0.0–0.1)
Basophils Relative: 0 %
Eosinophils Absolute: 0 10*3/uL (ref 0.0–0.5)
Eosinophils Relative: 0 %
HCT: 41.8 % (ref 36.0–46.0)
Hemoglobin: 14.3 g/dL (ref 12.0–15.0)
Immature Granulocytes: 1 %
Lymphocytes Relative: 13 %
Lymphs Abs: 2 10*3/uL (ref 0.7–4.0)
MCH: 28.5 pg (ref 26.0–34.0)
MCHC: 34.2 g/dL (ref 30.0–36.0)
MCV: 83.3 fL (ref 80.0–100.0)
Monocytes Absolute: 1.1 10*3/uL — ABNORMAL HIGH (ref 0.1–1.0)
Monocytes Relative: 7 %
Neutro Abs: 12.4 10*3/uL — ABNORMAL HIGH (ref 1.7–7.7)
Neutrophils Relative %: 79 %
Platelets: 303 10*3/uL (ref 150–400)
RBC: 5.02 MIL/uL (ref 3.87–5.11)
RDW: 13.2 % (ref 11.5–15.5)
WBC: 15.5 10*3/uL — ABNORMAL HIGH (ref 4.0–10.5)
nRBC: 0 % (ref 0.0–0.2)

## 2023-06-24 LAB — COMPREHENSIVE METABOLIC PANEL
ALT: 35 U/L (ref 0–44)
AST: 24 U/L (ref 15–41)
Albumin: 4.5 g/dL (ref 3.5–5.0)
Alkaline Phosphatase: 67 U/L (ref 38–126)
Anion gap: 12 (ref 5–15)
BUN: 7 mg/dL (ref 6–20)
CO2: 22 mmol/L (ref 22–32)
Calcium: 9 mg/dL (ref 8.9–10.3)
Chloride: 100 mmol/L (ref 98–111)
Creatinine, Ser: 0.66 mg/dL (ref 0.44–1.00)
GFR, Estimated: >60 mL/min (ref >60)
Glucose, Bld: 121 mg/dL — ABNORMAL HIGH (ref 70–99)
Potassium: 3.2 mmol/L — ABNORMAL LOW (ref 3.5–5.1)
Sodium: 134 mmol/L — ABNORMAL LOW (ref 135–145)
Total Bilirubin: 1.1 mg/dL (ref 0.0–1.2)
Total Protein: 8.1 g/dL (ref 6.5–8.1)

## 2023-06-24 LAB — PREGNANCY, URINE: Preg Test, Ur: NEGATIVE

## 2023-06-24 LAB — LIPASE, BLOOD: Lipase: 22 U/L (ref 11–51)

## 2023-06-24 MED ORDER — SODIUM CHLORIDE 0.9 % IV BOLUS
1000.0000 mL | Freq: Once | INTRAVENOUS | Status: AC
  Administered 2023-06-24: 1000 mL via INTRAVENOUS

## 2023-06-24 MED ORDER — KETOROLAC TROMETHAMINE 30 MG/ML IJ SOLN
30.0000 mg | Freq: Once | INTRAMUSCULAR | Status: AC
  Administered 2023-06-24: 30 mg via INTRAVENOUS
  Filled 2023-06-24: qty 1

## 2023-06-24 MED ORDER — PROCHLORPERAZINE EDISYLATE 10 MG/2ML IJ SOLN
10.0000 mg | Freq: Once | INTRAMUSCULAR | Status: AC
  Administered 2023-06-24: 10 mg via INTRAVENOUS
  Filled 2023-06-24: qty 2

## 2023-06-24 MED ORDER — DROPERIDOL 2.5 MG/ML IJ SOLN: 2.5000 mg | Freq: Once | INTRAMUSCULAR | Status: DC

## 2023-06-24 MED ORDER — PROCHLORPERAZINE EDISYLATE 10 MG/2ML IJ SOLN: 10.0000 mg | Freq: Once | INTRAMUSCULAR | Status: DC

## 2023-06-24 MED ORDER — SODIUM CHLORIDE 0.9 % IV SOLN
12.5000 mg | Freq: Four times a day (QID) | INTRAVENOUS | Status: DC | PRN
  Administered 2023-06-24: 12.5 mg via INTRAVENOUS
  Filled 2023-06-24: qty 0.5

## 2023-06-24 NOTE — Telephone Encounter (Unsigned)
 Copied from CRM [id number]. Topic: Clinical - Medication Refill >> Jun 24, 2023  7:40 AM Deaijah H wrote: Most Recent Primary Care Visit:  Provider: Sharlene Dory  Department: [facility]  Visit Type: OFFICE VISIT  Date: 04/11/2023  Medication: promethazine (PHENERGAN) 25 MG suppository  Has the patient contacted their pharmacy? Yes (Agent: If no, request that the patient contact the pharmacy for the refill. If patient does not wish to contact the pharmacy document the reason why and proceed with request.) (Agent: If yes, when and what did the pharmacy advise?) Stated they sent prescription to Dr. Carmelia Roller (not showing)  Is this the correct pharmacy for this prescription? Yes If no, delete pharmacy and type the correct one.  This is the patient's preferred pharmacy:  [facility] [address] [place] [address] Phone: [phone]    Has the prescription been filled recently? Yes  Is the patient out of the medication? Yes  Has the patient been seen for an appointment in the last year OR does the patient have an upcoming appointment? Yes  Can we respond through MyChart? Yes  Agent: Please be advised that Rx refills may take up to 3 business days. We ask that you follow-up with your pharmacy.

## 2023-06-24 NOTE — Discharge Instructions (Signed)
 Continue medications as previously prescribed.  Follow-up with primary doctor if symptoms persist.

## 2023-06-24 NOTE — ED Provider Notes (Signed)
 [place] [facility] Provider Note   CSN: [id number] Arrival date & time: 06/23/23  2203     History  Chief Complaint  Patient presents with   Emesis    Christine Trujillo is a 28 y.o. female.  Patient is a 28 year old female with past medical history of recurrent nausea and vomiting.  Patient just seen this morning with the same complaint.  She was given IV fluids and medications, then discharged to home.  She states that since arriving home, she has been unable to keep anything down.  She continues to vomit.  She also describes some loose stools.  She was told last week that she had norovirus at [facility].  She denies bloody stool.  No fevers or chills.  The history is provided by the patient.       Home Medications Prior to Admission medications   Medication Sig Start Date End Date Taking? Authorizing Provider  DULoxetine (CYMBALTA) 20 MG capsule Take 1 capsule (20 mg total) by mouth daily. 01/31/23   Sharlene Dory, DO  famotidine (PEPCID) 20 MG tablet Take 1 tablet (20 mg total) by mouth 2 (two) times daily. 06/23/23   Rosezella Rumpf, PA-C  loperamide (IMODIUM A-D) 2 MG tablet Take 1 tablet (2 mg total) by mouth 4 (four) times daily as needed for diarrhea or loose stools. 06/17/23   Roemhildt, Lorin T, PA-C  meloxicam (MOBIC) 15 MG tablet Take 1 tablet (15 mg total) by mouth daily. 04/11/23   Sharlene Dory, DO  metoCLOPramide (REGLAN) 10 MG tablet Take 1 tablet (10 mg total) by mouth every 6 (six) hours. 06/17/23   Roemhildt, Lorin T, PA-C  Multiple Vitamins-Minerals (MULTIVITAMIN WITH MINERALS) tablet Take 1 tablet by mouth daily.    [provider]  nicotine (NICODERM CQ - DOSED IN MG/24 HR) 7 mg/24hr patch Place 1 patch (7 mg total) onto the skin daily. 03/28/23   Sharlene Dory, DO  ondansetron (ZOFRAN-ODT) 4 MG disintegrating tablet Take 1 tablet (4 mg total) by mouth every 8 (eight) hours as  needed. 06/23/23   Rosezella Rumpf, PA-C  pantoprazole (PROTONIX) 40 MG tablet Take 1 tablet (40 mg total) by mouth daily. 01/31/23   Sharlene Dory, DO  potassium chloride (KLOR-CON 10) 10 MEQ tablet Take 2 tablets (20 mEq total) by mouth daily. 02/01/23   Sharlene Dory, DO  promethazine (PHENERGAN) 25 MG suppository Place 1 suppository (25 mg total) rectally every 6 (six) hours as needed for nausea or vomiting. 06/23/23   Rosezella Rumpf, PA-C  Varenicline Tartrate, Starter, (CHANTIX STARTING MONTH PAK) 0.5 MG X 11 & 1 MG X 42 TBPK Follow instructions on package. 05/04/23   Sharlene Dory, DO      Allergies    Patient has no known allergies.    Review of Systems   Review of Systems  All other systems reviewed and are negative.   Physical Exam Updated Vital Signs BP (!) 159/95 (BP Location: Left Arm)   Pulse 77   Temp 98 F (36.7 C)   Resp 18   Ht 5\' 3"  (1.6 m)   Wt 109.8 kg   SpO2 99%   BMI 42.87 kg/m  Physical Exam Vitals and nursing note reviewed.  Constitutional:      General: She is not in acute distress.    Appearance: She is well-developed. She is not diaphoretic.  HENT:     Head: Normocephalic and atraumatic.  Cardiovascular:     Rate and Rhythm: Normal rate and regular rhythm.     Heart sounds: No murmur heard.    No friction rub. No gallop.  Pulmonary:     Effort: Pulmonary effort is normal. No respiratory distress.     Breath sounds: Normal breath sounds. No wheezing.  Abdominal:     General: Bowel sounds are normal. There is no distension.     Palpations: Abdomen is soft.     Tenderness: There is no abdominal tenderness.  Musculoskeletal:        General: Normal range of motion.     Cervical back: Normal range of motion and neck supple.  Skin:    General: Skin is warm and dry.  Neurological:     General: No focal deficit present.     Mental Status: She is alert and oriented to person, place, and time.     ED Results /  Procedures / Treatments   Labs (all labs ordered are listed, but only abnormal results are displayed) Labs Reviewed  PREGNANCY, URINE    EKG None  Radiology No results found.  Procedures Procedures    Medications Ordered in ED Medications  sodium chloride 0.9 % bolus 1,000 mL (has no administration in time range)  prochlorperazine (COMPAZINE) injection 10 mg (has no administration in time range)  ketorolac (TORADOL) 30 MG/ML injection 30 mg (has no administration in time range)  ondansetron (ZOFRAN-ODT) disintegrating tablet 4 mg (4 mg Oral Given 06/23/23 2238)    ED Course/ Medical Decision Making/ A&P  Patient with history of cyclic vomiting/cannabis hyperemesis presenting with vomiting.  She was seen earlier this morning with similar complaints.  Patient arrives here with stable vital signs and is afebrile.  Physical examination basically unremarkable.  Laboratory studies obtained including CBC, CMP, and lipase.  White count is 15.5, but consistent with her baseline.  Electrolytes are unremarkable and lipase is normal.  Patient has been hydrated with normal saline and given Compazine and Phenergan for nausea.  She now seems better and feels as though she can go home.  Patient to be discharged with continued use of home medications and follow-up as needed.  Final Clinical Impression(s) / ED Diagnoses Final diagnoses:  None    Rx / DC Orders ED Discharge Orders     None         Geoffery Lyons, MD 06/24/23 [phone]

## 2023-06-24 NOTE — Telephone Encounter (Signed)
 Promethazine (PHENERGAN) 25 MG suppository was sent by Theresia Lo DO on 06/23/2023. Please call pharmacy for update   [facility] [address] [place] [address] Phone: [phone]  Fax: [phone] DEA #: [id number]

## 2023-06-27 ENCOUNTER — Encounter: Payer: Self-pay | Admitting: Pharmacist

## 2023-06-27 ENCOUNTER — Ambulatory Visit (INDEPENDENT_AMBULATORY_CARE_PROVIDER_SITE_OTHER): Payer: MEDICAID | Admitting: Family Medicine

## 2023-06-27 ENCOUNTER — Other Ambulatory Visit: Payer: Self-pay | Admitting: Family Medicine

## 2023-06-27 ENCOUNTER — Encounter: Payer: Self-pay | Admitting: Family Medicine

## 2023-06-27 ENCOUNTER — Other Ambulatory Visit (HOSPITAL_BASED_OUTPATIENT_CLINIC_OR_DEPARTMENT_OTHER): Payer: Self-pay

## 2023-06-27 VITALS — BP 129/81 | HR 73 | Temp 98.7°F | Resp 16 | Ht 63.0 in | Wt 245.0 lb

## 2023-06-27 DIAGNOSIS — R1110 Vomiting, unspecified: Secondary | ICD-10-CM

## 2023-06-27 DIAGNOSIS — Z720 Tobacco use: Secondary | ICD-10-CM | POA: Diagnosis not present

## 2023-06-27 DIAGNOSIS — D72825 Bandemia: Secondary | ICD-10-CM

## 2023-06-27 MED ORDER — VARENICLINE TARTRATE (STARTER) 0.5 MG X 11 & 1 MG X 42 PO TBPK
ORAL_TABLET | ORAL | 0 refills | Status: DC
  Filled 2023-06-27: qty 53, 30d supply, fill #0

## 2023-06-27 MED ORDER — CAPSAICIN 0.025 % EX CREA: TOPICAL_CREAM | CUTANEOUS | 0 refills | Status: DC

## 2023-06-27 NOTE — Patient Instructions (Signed)
 Let me know if there are cost issues.  Send me a message in a few days if you don't hear from me about the topical nausea cream that I am unaware of.  Let us know if you need anything.

## 2023-06-27 NOTE — Progress Notes (Signed)
 Chief Complaint  Patient presents with   Follow-up    Here for follow up from ed visits.     Subjective: Patient is a 28 y.o. female here for ER fu.  Has been to ED on several occasions for hyperemesis 2/2 cannabis use. She uses this because she is stressed with her living situation. She is actively working to get out of the situation. No current issues. WBC was elevated 2/2 neutrophil predominance. Elevation has been a/w illness. She is a smoker. Smokes around 1 ppd.  She tried using nicotine patches keeps falling off of her skin.  She is wondering if there is a tablet which helped.  Past Medical History:  Diagnosis Date   Anxiety    Cannabinoid hyperemesis syndrome    Depression    GERD (gastroesophageal reflux disease)    History of anemia    during pregnancy   History of gestational diabetes    Ovarian cyst    left   Pelvic pain    Upper respiratory symptom    symptoms started 02-16-2021 cough/ runny nose;  pcp visit 03-10-2021 with sore throat/ congestion/ sob/ body aches/ cough but no fever, negative covid/ flu/ strep resultsin epic  (03-13-2021  pt stated today only has nonproductive cough)    Objective: BP 129/81 (BP Location: Right Arm, Patient Position: Sitting, Cuff Size: Large)   Pulse 73   Temp 98.7 F (37.1 C) (Oral)   Resp 16   Ht 5\' 3"  (1.6 m)   Wt 245 lb (111.1 kg)   SpO2 100%   BMI 43.40 kg/m  General: Awake, appears stated age Lungs: No accessory muscle use Psych: Age appropriate judgment and insight, normal affect and mood  Assessment and Plan: Tobacco abuse  Bandemia  Hyperemesis  Chronic, uncontrolled.  Start Chantix.  If not covered by insurance, will send in Zyban.  She will let us know via the patient portal.  Follow-up in 6 weeks. Seems like it was reactive to acute illness.  She is a chronic tobacco user.  This is always led to a slight increase in her WBC.  I will recheck this level at her follow-up. Currently resolved.  Secondary to  cannabis use.  She requested a topical cream which was given to her in the ER for nausea.  Will reach out to our pharmacy team to see what this was. The patient voiced understanding and agreement to the plan.  [name] [place], DO 06/27/23  10:38 AM

## 2023-07-04 ENCOUNTER — Encounter: Payer: Self-pay | Admitting: Family Medicine

## 2023-07-04 ENCOUNTER — Ambulatory Visit (INDEPENDENT_AMBULATORY_CARE_PROVIDER_SITE_OTHER): Payer: MEDICAID | Admitting: Family Medicine

## 2023-07-04 ENCOUNTER — Other Ambulatory Visit (HOSPITAL_BASED_OUTPATIENT_CLINIC_OR_DEPARTMENT_OTHER): Payer: Self-pay

## 2023-07-04 VITALS — BP 118/76 | HR 81 | Temp 98.5°F | Ht 63.0 in

## 2023-07-04 DIAGNOSIS — L308 Other specified dermatitis: Secondary | ICD-10-CM

## 2023-07-04 IMAGING — CT CT ABD-PELV W/ CM
2 of 4 series · 16 of 46 positions shown, 18 images · IV contrast (Omnipaque)
Comparison: 11/09/2016

CLINICAL DATA: Nausea and vomiting. Vomiting for 3 days. Diarrhea.
Abdominal pain

EXAM:
CT ABDOMEN AND PELVIS WITH CONTRAST
TECHNIQUE: Multidetector CT imaging of the abdomen and pelvis was performed
using the standard protocol following bolus administration of
intravenous contrast.
CONTRAST:  75mL OMNIPAQUE IOHEXOL 300 MG/ML  SOLN

[Series 2: axial st · axial · 0.98mm/px · z∈[-524,-70]mm · 13 of 99 slices shown, 15 images]
[im 4/99  soft-tissue]
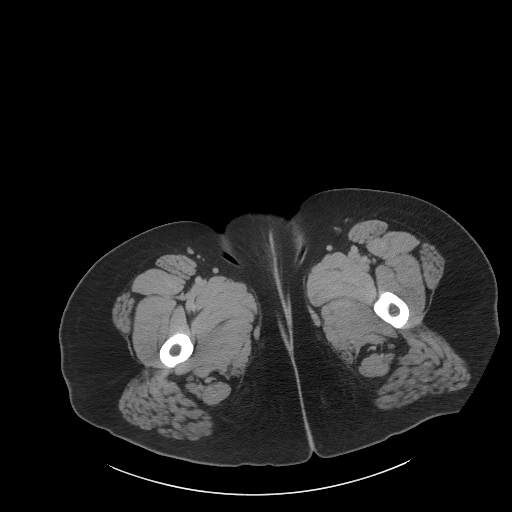
[im 4/99  bone]
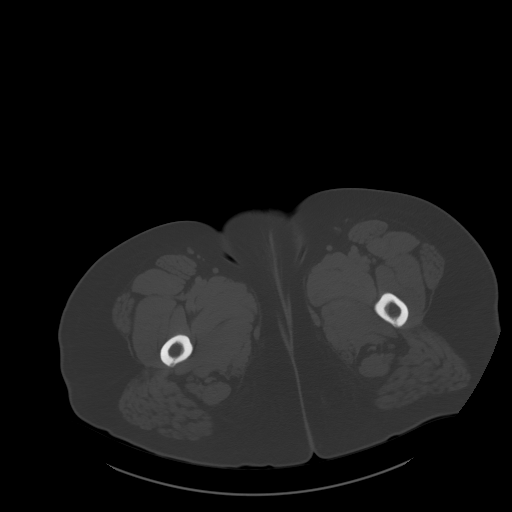
[im 12/99  soft-tissue]
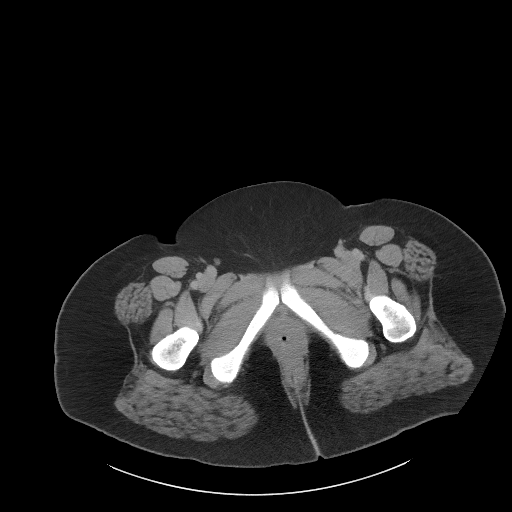
[im 20/99  soft-tissue]
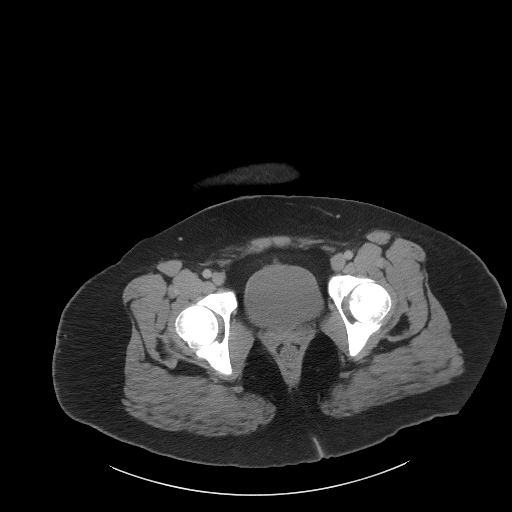
[im 28/99  soft-tissue]
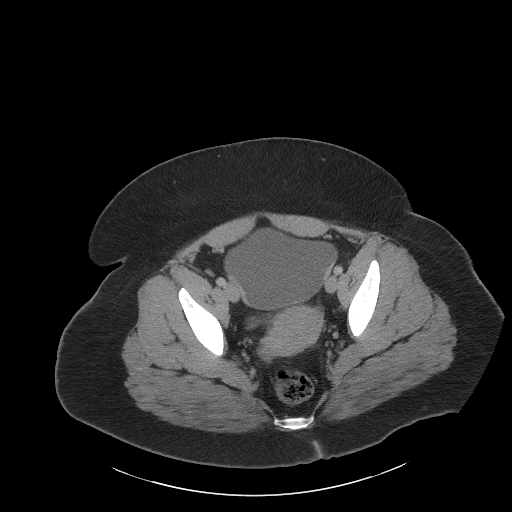
[im 36/99  soft-tissue]
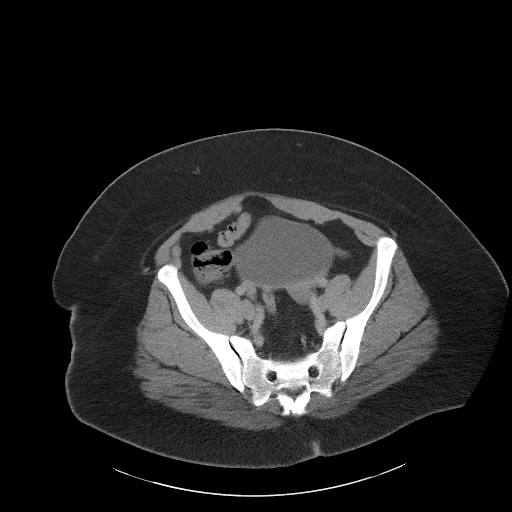
[im 44/99  soft-tissue]
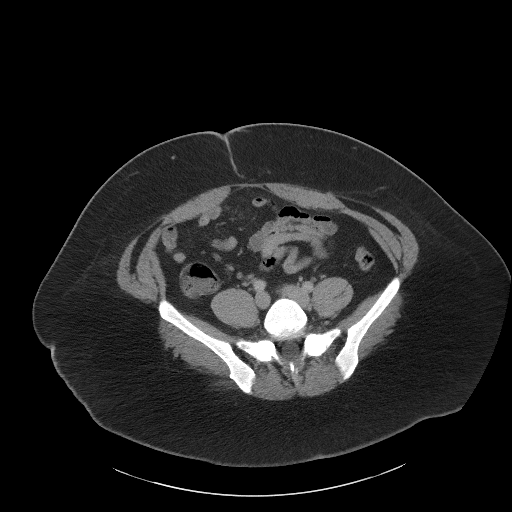
[im 51/99  soft-tissue]
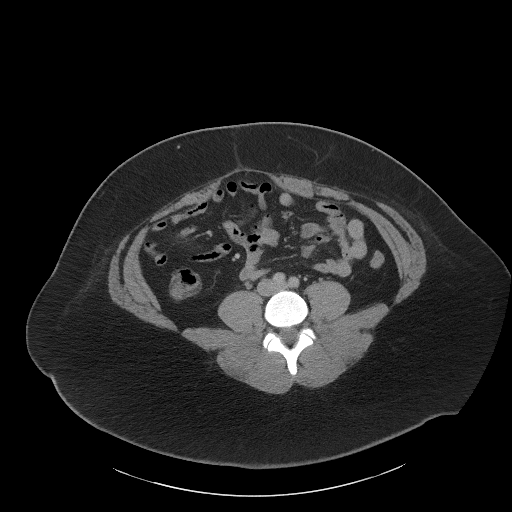
[im 55/99  soft-tissue]
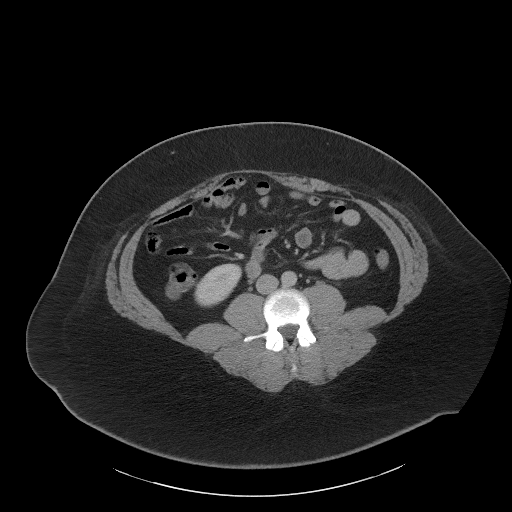
[im 63/99  soft-tissue]
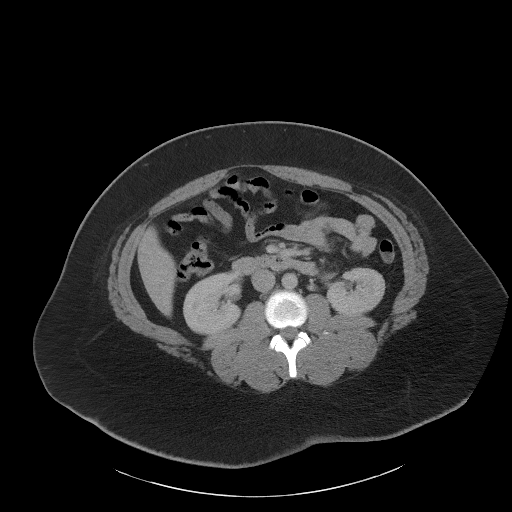
[im 63/99  bone]
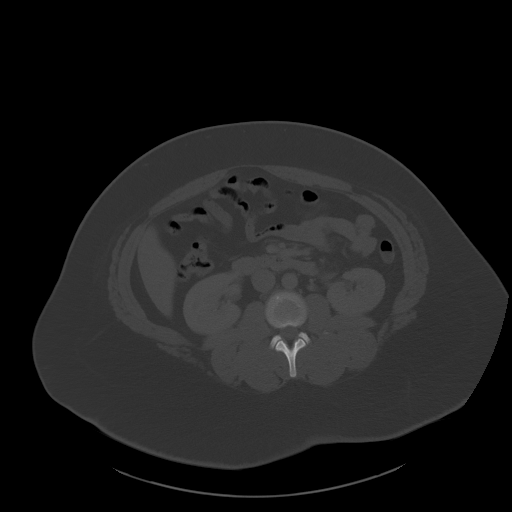
[im 71/99  soft-tissue]
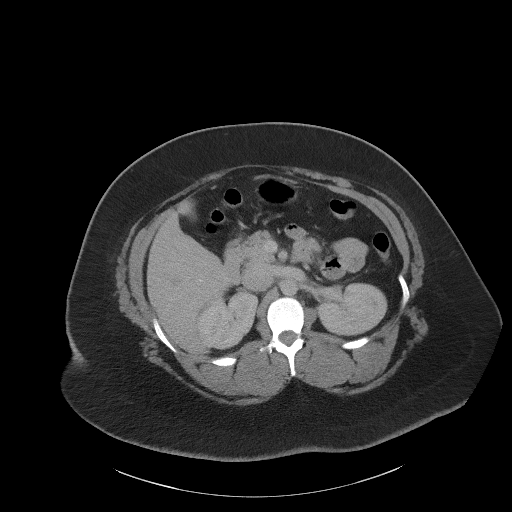
[im 79/99  soft-tissue]
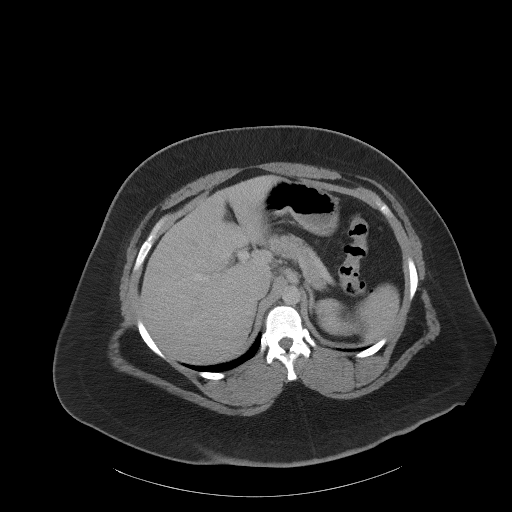
[im 87/99  soft-tissue]
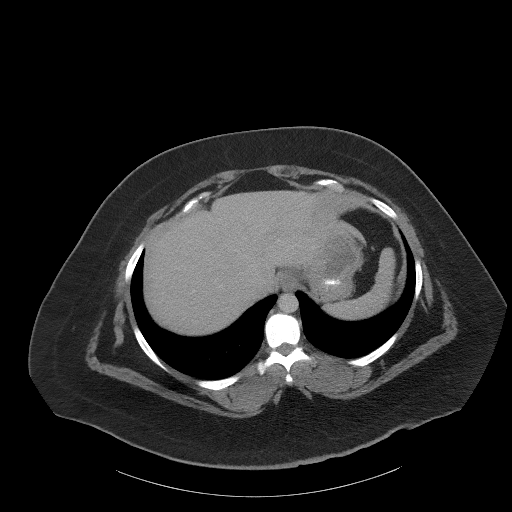
[im 95/99  soft-tissue]
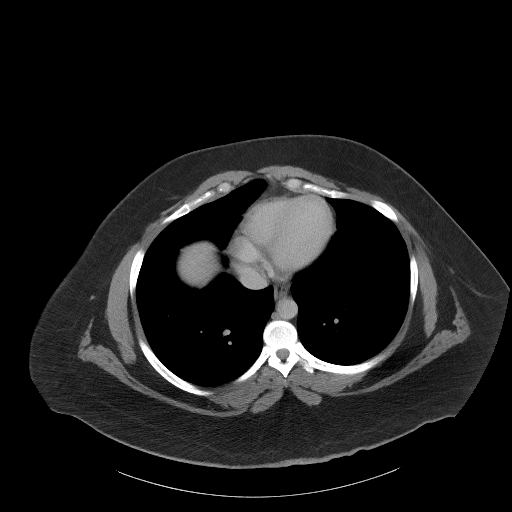

[Series 5: coronal st · coronal · 0.84mm/px · 3 of 108 slices shown]
[im 36/108  soft-tissue]
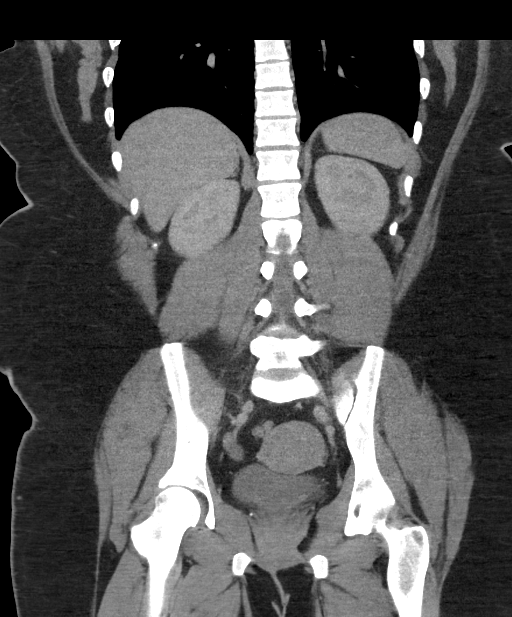
[im 48/108  soft-tissue]
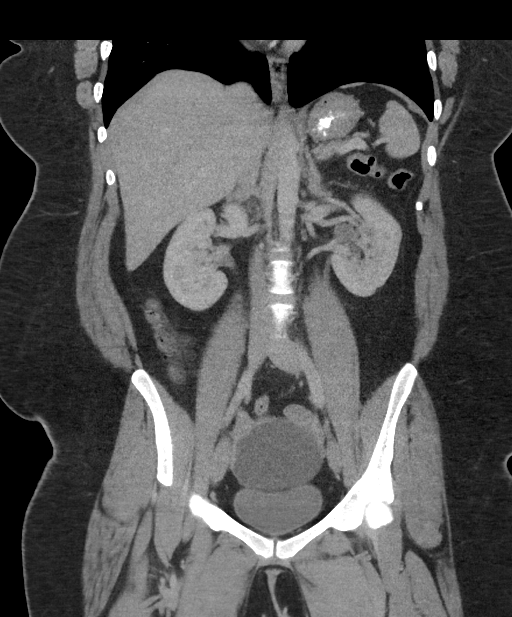
[im 60/108  soft-tissue]
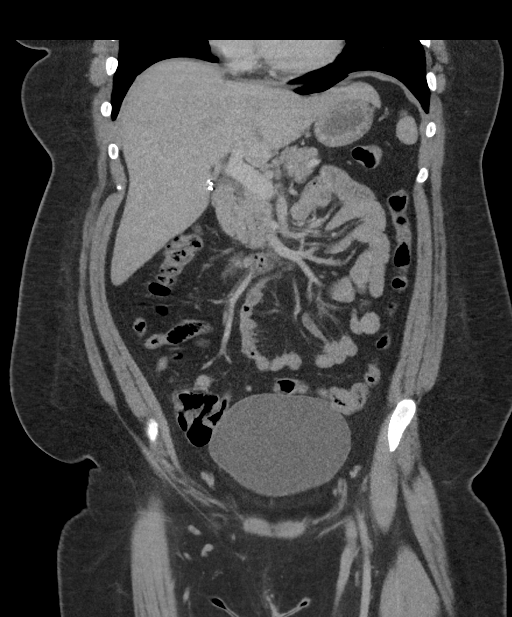

[16 of 46 positions shown; findings below may reference images not displayed]

FINDINGS: Lower chest: Lung bases are clear.

Hepatobiliary: No focal liver abnormality is seen. Status post
cholecystectomy. No biliary dilatation.

Pancreas: Unremarkable. No pancreatic ductal dilatation or
surrounding inflammatory changes.

Spleen: Normal in size without focal abnormality.

Adrenals/Urinary Tract: Adrenal glands are unremarkable. Kidneys are
normal, without renal calculi, focal lesion, or hydronephrosis.
Bladder is unremarkable.

Stomach/Bowel: Stomach is within normal limits. Appendix appears
normal. No evidence of bowel wall thickening, distention, or
inflammatory changes.

Vascular/Lymphatic: No significant vascular findings are present. No
enlarged abdominal or pelvic lymph nodes.

Reproductive: Uterus is not enlarged. There is a large cyst arising
from the left ovary and extending anteriorly in the pelvis,
measuring 7.9 x 11.1 cm.

Other: No abdominal wall hernia or abnormality. No abdominopelvic
ascites.

Musculoskeletal: No acute or significant osseous findings.
IMPRESSION: 1. 11.1 cm diameter cystic mass arising from the left ovary. This is
concerning for low-grade cystic neoplasm. Recommend GYN consult and
consider pelvis MRI w/o and w/ contrast if clinically warranted.
Note: This recommendation does not apply to premenarchal patients
and to those with increased risk (genetic, family history, elevated
tumor markers or other high-risk factors) of ovarian cancer.
Reference: JACR [DATE]):[id number]
2. No acute process otherwise demonstrated in the abdomen or pelvis.
No evidence of bowel obstruction or inflammation.

## 2023-07-04 MED ORDER — PREDNISONE 20 MG PO TABS
40.0000 mg | ORAL_TABLET | Freq: Every day | ORAL | 0 refills | Status: AC
  Filled 2023-07-04: qty 10, 5d supply, fill #0

## 2023-07-04 MED ORDER — TRIAMCINOLONE ACETONIDE 0.1 % EX CREA
1.0000 | TOPICAL_CREAM | Freq: Two times a day (BID) | CUTANEOUS | 0 refills | Status: DC
  Filled 2023-07-04: qty 30, 15d supply, fill #0

## 2023-07-04 MED ORDER — LEVOCETIRIZINE DIHYDROCHLORIDE 5 MG PO TABS
5.0000 mg | ORAL_TABLET | Freq: Every evening | ORAL | 2 refills | Status: DC
Start: 2023-07-04 — End: 2024-01-04
  Filled 2023-07-04: qty 30, 30d supply, fill #0

## 2023-07-04 NOTE — Progress Notes (Signed)
 CC: Rash  Christine Trujillo is a 28 y.o. female here for a skin complaint.  Duration: 8 days Location: arms, neck, torso Pruritic? Yes Painful? No Drainage? No New soaps/lotions/topicals/detergents? No Sick contacts? No Other associated symptoms: no fevers, shortness of breath, swelling in mouth/throat. Therapies tried thus far: Diaper rash cream  Past Medical History:  Diagnosis Date   Anxiety    Cannabinoid hyperemesis syndrome    Depression    GERD (gastroesophageal reflux disease)    History of anemia    during pregnancy   History of gestational diabetes    Ovarian cyst    left   Pelvic pain    Upper respiratory symptom    symptoms started 02-16-2021 cough/ runny nose;  pcp visit 03-10-2021 with sore throat/ congestion/ sob/ body aches/ cough but no fever, negative covid/ flu/ strep resultsin epic  (03-13-2021  pt stated today only has nonproductive cough)    BP 118/76   Pulse 81   Temp 98.5 F (36.9 C)   Ht 5\' 3"  (1.6 m)   SpO2 97%   BMI 43.40 kg/m  Gen: awake, alert, appearing stated age Lungs: No accessory muscle use Skin: Hyperpigmented patches over the anterior upper chest, upper extremities, and left dorsum of both hands.  There is some scaling. No drainage, erythema, TTP, fluctuance, excoriation Psych: Age appropriate judgment and insight  Pruritic dermatitis - Plan: predniSONE (DELTASONE) 20 MG tablet, levocetirizine (XYZAL) 5 MG tablet, triamcinolone cream (KENALOG) 0.1 %  Start an oral antihistamine.  5-day prednisone burst 40 mg daily.  After that we will start using Kenalog twice daily as needed.  Try not to scratch.  Avoid scented products. F/u prn. The patient voiced understanding and agreement to the plan.  [name] [place], DO 07/04/23 3:08 PM

## 2023-07-04 NOTE — Patient Instructions (Addendum)
Try not to scratch as this can make things worse. Avoid scented products while dealing with this. You may resume when the itchiness resolves. Cold/cool compresses can help.   Claritin (loratadine), Allegra (fexofenadine), Zyrtec (cetirizine) which is also equivalent to Xyzal (levocetirizine); these are listed in order from weakest to strongest. Generic, and therefore cheaper, options are in the parentheses.   There are available OTC, and the generic versions, which may be cheaper, are in parentheses. Show this to a pharmacist if you have trouble finding any of these items.  Let us know if you need anything.

## 2023-07-08 ENCOUNTER — Ambulatory Visit: Payer: Self-pay

## 2023-07-08 NOTE — Telephone Encounter (Signed)
  Chief Complaint: nose bleeds Symptoms: nose bleed 2-4xday lasting less than 3 mins and mild bleeding able to get bleeding to stop easily.  Frequency: 3 days Pertinent Negatives: Patient denies any other sx  Disposition: [] ED /[] Urgent Care (no appt availability in office) / [] Appointment(In office/virtual)/ []  [facility]/ [x] Home Care/ [] Refused Recommended Disposition /[] [facility]/ []  Follow-up with PCP Additional Notes: pt recently started on prednisone, Xyzal, and Chantix. Pt states nose bleeds started after starting medication and nasal area is very dry. Recommended based on care advice pt use afrin or saline nasal spray and see if resolves. Can FU on Monday if still having sx. Pt verbalized understanding.   Copied From CRM [id number]. Reason for Triage: Nose bleeding multiple times a day, patient states it may be due to new medications. She started Prednisone recently as well as 1 other medication she can't remember the name of.   Reason for Disposition  [1] Nosebleed AND [2] bleeding present < 30 minutes AND [3] using correct technique per guideline  Answer Assessment - Initial Assessment Questions 1. AMOUNT OF BLEEDING: "How bad is the bleeding?" "How much blood was lost?" "Has the bleeding stopped?"   - MILD: needed a couple tissues   - MODERATE: needed many tissues   - SEVERE: large blood clots, soaked many tissues, lasted more than 30 minutes      Mild  2. ONSET: "When did the nosebleed start?"      07/05/23 3. FREQUENCY: "How many nosebleeds have you had in the last 24 hours?"      2-4x/day  5. CAUSE: "What do you think caused this nosebleed?"     Unsure if r/t new med prednisone, Chantix, and Xyzal   6. LOCAL FACTORS: "Do you have any cold symptoms?", "Have you been rubbing or picking at your nose?"     No feels like nasal area dry 7. SYSTEMIC FACTORS: "Do you have high blood pressure or any bleeding problems?"     no 8. BLOOD THINNERS: "Do you take  any blood thinners?" (e.g., aspirin, clopidogrel / Plavix, coumadin, heparin). Notes: Other strong blood thinners include: Arixtra (fondaparinux), Eliquis (apixaban), Pradaxa (dabigatran), and Xarelto (rivaroxaban).     no 9. OTHER SYMPTOMS: "Do you have any other symptoms?" (e.g., lightheadedness)     no  Protocols used: Nosebleed-A-AH

## 2023-07-10 ENCOUNTER — Other Ambulatory Visit: Payer: Self-pay | Admitting: Family Medicine

## 2023-07-11 ENCOUNTER — Other Ambulatory Visit (HOSPITAL_BASED_OUTPATIENT_CLINIC_OR_DEPARTMENT_OTHER): Payer: Self-pay

## 2023-07-11 MED ORDER — METOCLOPRAMIDE HCL 10 MG PO TABS
10.0000 mg | ORAL_TABLET | Freq: Four times a day (QID) | ORAL | 0 refills | Status: DC
  Filled 2023-07-11 – 2023-08-24 (×3): qty 30, 8d supply, fill #0

## 2023-07-11 MED ORDER — VARENICLINE TARTRATE (STARTER) 0.5 MG X 11 & 1 MG X 42 PO TBPK
ORAL_TABLET | ORAL | 0 refills | Status: DC
  Filled 2023-07-11: qty 53, fill #0
  Filled 2023-08-24: qty 53, 30d supply, fill #0

## 2023-07-11 MED ORDER — FAMOTIDINE 20 MG PO TABS
20.0000 mg | ORAL_TABLET | Freq: Two times a day (BID) | ORAL | 0 refills | Status: DC
  Filled 2023-07-11 – 2024-01-06 (×4): qty 30, 15d supply, fill #0

## 2023-07-22 ENCOUNTER — Other Ambulatory Visit (HOSPITAL_BASED_OUTPATIENT_CLINIC_OR_DEPARTMENT_OTHER): Payer: Self-pay

## 2023-07-25 ENCOUNTER — Other Ambulatory Visit (HOSPITAL_BASED_OUTPATIENT_CLINIC_OR_DEPARTMENT_OTHER): Payer: Self-pay

## 2023-08-08 ENCOUNTER — Other Ambulatory Visit (HOSPITAL_BASED_OUTPATIENT_CLINIC_OR_DEPARTMENT_OTHER): Payer: Self-pay

## 2023-08-09 ENCOUNTER — Ambulatory Visit: Payer: MEDICAID | Admitting: Family Medicine

## 2023-08-25 ENCOUNTER — Other Ambulatory Visit (HOSPITAL_BASED_OUTPATIENT_CLINIC_OR_DEPARTMENT_OTHER): Payer: Self-pay

## 2023-09-08 ENCOUNTER — Other Ambulatory Visit (HOSPITAL_BASED_OUTPATIENT_CLINIC_OR_DEPARTMENT_OTHER): Payer: Self-pay

## 2023-09-23 ENCOUNTER — Telehealth: Payer: Self-pay

## 2023-09-23 ENCOUNTER — Other Ambulatory Visit: Payer: Self-pay | Admitting: Family Medicine

## 2023-09-23 MED ORDER — NORGESTIMATE-ETH ESTRADIOL 0.25-35 MG-MCG PO TABS: ORAL_TABLET | ORAL | 2 refills | Status: DC

## 2023-09-23 NOTE — Telephone Encounter (Signed)
 Copied from CRM [id number]. Topic: Clinical - Medical Advice >> Sep 23, 2023  2:13 PM Chuck Crater wrote: Reason for CRM: Patient has birth control in arm and she just started back bleeding and soon will be heavy. She is also cramping. She wanted to know if that was normal. She was told that she wouldn't bleed since she was previously on Depo and she was told that it was the same drug. She said that she went through a tampon and diaper within 15 minutes last month.

## 2023-09-23 NOTE — Telephone Encounter (Signed)
Called pt was advised and stated understand

## 2023-09-23 NOTE — Telephone Encounter (Signed)
 I sent an OCP to take for 7 days as needed. This is seen sometimes, usually in the first 3-6 mo of insertion. Let's see how things go.

## 2023-10-07 ENCOUNTER — Other Ambulatory Visit: Payer: Self-pay

## 2023-10-07 DIAGNOSIS — R1084 Generalized abdominal pain: Secondary | ICD-10-CM | POA: Insufficient documentation

## 2023-10-07 DIAGNOSIS — R197 Diarrhea, unspecified: Secondary | ICD-10-CM | POA: Insufficient documentation

## 2023-10-07 DIAGNOSIS — R1115 Cyclical vomiting syndrome unrelated to migraine: Secondary | ICD-10-CM | POA: Insufficient documentation

## 2023-10-07 DIAGNOSIS — R1110 Vomiting, unspecified: Secondary | ICD-10-CM | POA: Diagnosis present

## 2023-10-08 ENCOUNTER — Emergency Department (HOSPITAL_BASED_OUTPATIENT_CLINIC_OR_DEPARTMENT_OTHER)
Admission: EM | Admit: 2023-10-08 | Discharge: 2023-10-08 | Disposition: A | Discharge status: Home / Self Care | Payer: MEDICAID | Attending: Emergency Medicine | Admitting: Emergency Medicine

## 2023-10-08 ENCOUNTER — Encounter (HOSPITAL_BASED_OUTPATIENT_CLINIC_OR_DEPARTMENT_OTHER): Payer: Self-pay

## 2023-10-08 ENCOUNTER — Other Ambulatory Visit: Payer: Self-pay

## 2023-10-08 ENCOUNTER — Other Ambulatory Visit (HOSPITAL_BASED_OUTPATIENT_CLINIC_OR_DEPARTMENT_OTHER): Payer: Self-pay

## 2023-10-08 DIAGNOSIS — R1115 Cyclical vomiting syndrome unrelated to migraine: Secondary | ICD-10-CM

## 2023-10-08 LAB — COMPREHENSIVE METABOLIC PANEL WITH GFR
ALT: 15 U/L (ref 0–44)
AST: 20 U/L (ref 15–41)
Albumin: 4.6 g/dL (ref 3.5–5.0)
Alkaline Phosphatase: 98 U/L (ref 38–126)
Anion gap: 21 — ABNORMAL HIGH (ref 5–15)
BUN: 8 mg/dL (ref 6–20)
CO2: 15 mmol/L — ABNORMAL LOW (ref 22–32)
Calcium: 9.4 mg/dL (ref 8.9–10.3)
Chloride: 99 mmol/L (ref 98–111)
Creatinine, Ser: 0.86 mg/dL (ref 0.44–1.00)
GFR, Estimated: >60 mL/min (ref >60)
Glucose, Bld: 189 mg/dL — ABNORMAL HIGH (ref 70–99)
Potassium: 3 mmol/L — ABNORMAL LOW (ref 3.5–5.1)
Sodium: 135 mmol/L (ref 135–145)
Total Bilirubin: 0.8 mg/dL (ref 0.0–1.2)
Total Protein: 7.6 g/dL (ref 6.5–8.1)

## 2023-10-08 LAB — CBC
HCT: 41.1 % (ref 36.0–46.0)
Hemoglobin: 13.8 g/dL (ref 12.0–15.0)
MCH: 28 pg (ref 26.0–34.0)
MCHC: 33.6 g/dL (ref 30.0–36.0)
MCV: 83.5 fL (ref 80.0–100.0)
Platelets: 312 10*3/uL (ref 150–400)
RBC: 4.92 MIL/uL (ref 3.87–5.11)
RDW: 13.3 % (ref 11.5–15.5)
WBC: 19.5 10*3/uL — ABNORMAL HIGH (ref 4.0–10.5)
nRBC: 0 % (ref 0.0–0.2)

## 2023-10-08 LAB — LIPASE, BLOOD: Lipase: 13 U/L (ref 11–51)

## 2023-10-08 MED ORDER — HALOPERIDOL LACTATE 5 MG/ML IJ SOLN
5.0000 mg | Freq: Once | INTRAMUSCULAR | Status: AC
  Administered 2023-10-08: 5 mg via INTRAVENOUS
  Filled 2023-10-08: qty 1

## 2023-10-08 MED ORDER — ONDANSETRON 4 MG PO TBDP
4.0000 mg | ORAL_TABLET | Freq: Once | ORAL | Status: AC | PRN
  Administered 2023-10-08: 4 mg via ORAL
  Filled 2023-10-08: qty 1

## 2023-10-08 MED ORDER — DROPERIDOL 2.5 MG/ML IJ SOLN: 1.2500 mg | Freq: Once | INTRAMUSCULAR | Status: DC

## 2023-10-08 MED ORDER — LACTATED RINGERS IV BOLUS
1000.0000 mL | Freq: Once | INTRAVENOUS | Status: AC
  Administered 2023-10-08: 1000 mL via INTRAVENOUS

## 2023-10-08 MED ORDER — ONDANSETRON 4 MG PO TBDP
4.0000 mg | ORAL_TABLET | Freq: Three times a day (TID) | ORAL | 0 refills | Status: DC | PRN
  Filled 2023-10-08 – 2023-10-10 (×2): qty 20, 7d supply, fill #0

## 2023-10-08 MED ORDER — ONDANSETRON HCL 4 MG/2ML IJ SOLN: 4.0000 mg | Freq: Once | INTRAMUSCULAR | Status: DC

## 2023-10-08 NOTE — ED Triage Notes (Signed)
 Pt reports generalized abdominal pain, N/V starting this morning after taking BC Goodys. Denies blood in vomit and stool. Denies fever. NAD noted in triage.

## 2023-10-08 NOTE — ED Notes (Signed)
 Pt out of ED with steady gait not in visible distress.

## 2023-10-08 NOTE — ED Notes (Signed)
 Pt refusing to keep BP cuff, SPO2 monitor, and EKG on. States its uncomfortable, I dont like it educated on importance of monitoring VS especially after med admin d/t possible side effects.

## 2023-10-08 NOTE — ED Provider Notes (Signed)
 [place] [facility] AT [facility]  Provider Note  CSN: [id number] Arrival date & time: 10/07/23 2354  History Chief Complaint  Patient presents with   Vomiting    Christine Trujillo is a 28 y.o. female with history of cyclic vomiting/suspected CHS reports 2 days of vomiting, diarrhea and abdominal pain. She reports symptoms started after she took a BC Powder for a toothache. She denies any recent marijuana use and states her symptoms are not due to [facility] but to NSAIDs. She denies any blood in stool.   Home Medications Prior to Admission medications   Medication Sig Start Date End Date Taking? Authorizing Provider  ondansetron  (ZOFRAN -ODT) 4 MG disintegrating tablet Take 1 tablet (4 mg total) by mouth every 8 (eight) hours as needed for nausea or vomiting. 10/08/23  Yes Roselyn Carlin NOVAK, MD  capsaicin  (ZOSTRIX) 0.025 % cream Apply over abdomen twice daily as needed for nausea. 06/27/23   Frann Mabel Mt, DO  DULoxetine  (CYMBALTA ) 20 MG capsule Take 1 capsule (20 mg total) by mouth daily. 01/31/23   Frann Mabel Mt, DO  famotidine  (PEPCID ) 20 MG tablet Take 1 tablet (20 mg total) by mouth 2 (two) times daily. 07/11/23   Frann Mabel Mt, DO  levocetirizine (XYZAL ) 5 MG tablet Take 1 tablet (5 mg total) by mouth every evening. 07/04/23   Wendling, Mabel Mt, DO  loperamide  (IMODIUM  A-D) 2 MG tablet Take 1 tablet (2 mg total) by mouth 4 (four) times daily as needed for diarrhea or loose stools. 06/17/23   Roemhildt, Lorin T, PA-C  meloxicam  (MOBIC ) 15 MG tablet Take 1 tablet (15 mg total) by mouth daily. 04/11/23   Frann Mabel Mt, DO  metoCLOPramide  (REGLAN ) 10 MG tablet Take 1 tablet (10 mg total) by mouth every 6 (six) hours. 07/11/23   Frann Mabel Mt, DO  Multiple Vitamins-Minerals (MULTIVITAMIN WITH MINERALS) tablet Take 1 tablet by mouth daily.    [provider]  nicotine  (NICODERM CQ  - DOSED IN MG/24 HR) 7 mg/24hr  patch Place 1 patch (7 mg total) onto the skin daily. 03/28/23   Frann Mabel Mt, DO  norgestimate -ethinyl estradiol  (SPRINTEC 28) 0.25-35 MG-MCG tablet Take 1 tab daily for 7 days in event of breakthrough bleeding. 09/23/23   Frann Mabel Mt, DO  pantoprazole  (PROTONIX ) 40 MG tablet Take 1 tablet (40 mg total) by mouth daily. 01/31/23   Frann Mabel Mt, DO  potassium chloride  (KLOR-CON  10) 10 MEQ tablet Take 2 tablets (20 mEq total) by mouth daily. 02/01/23   Frann Mabel Mt, DO  promethazine  (PHENERGAN ) 25 MG suppository Place 1 suppository (25 mg total) rectally every 6 (six) hours as needed for nausea or vomiting. 06/23/23   Kehrli, Kelsey F, PA-C  triamcinolone  cream (KENALOG ) 0.1 % Apply 1 Application topically 2 (two) times daily. 07/10/23   Frann Mabel Mt, DO  Varenicline  Tartrate, Starter, (CHANTIX  STARTING MONTH PAK) 0.5 MG X 11 & 1 MG X 42 TBPK Follow instructions on package. 07/11/23   Frann Mabel Mt, DO     Allergies    Patient has no known allergies.   Review of Systems   Review of Systems Please see HPI for pertinent positives and negatives  Physical Exam BP (!) 157/96 (BP Location: Right Arm)   Pulse 69   Temp (!) 96.4 F (35.8 C)   Resp (!) 24   Ht 5' 3 (1.6 m)   Wt 113.4 kg   SpO2 100%   BMI 44.29 kg/m  Physical Exam Vitals and nursing note reviewed.  Constitutional:      Appearance: Normal appearance.  HENT:     Head: Normocephalic and atraumatic.     Nose: Nose normal.     Mouth/Throat:     Mouth: Mucous membranes are moist.   Eyes:     Extraocular Movements: Extraocular movements intact.     Conjunctiva/sclera: Conjunctivae normal.    Cardiovascular:     Rate and Rhythm: Normal rate.  Pulmonary:     Effort: Pulmonary effort is normal.     Breath sounds: Normal breath sounds.  Abdominal:     General: Abdomen is flat.     Palpations: Abdomen is soft.     Tenderness: There is generalized abdominal  tenderness. There is no guarding.   Musculoskeletal:        General: No swelling. Normal range of motion.     Cervical back: Neck supple.   Skin:    General: Skin is warm and dry.   Neurological:     General: No focal deficit present.     Mental Status: She is alert.   Psychiatric:        Mood and Affect: Mood normal.     ED Results / Procedures / Treatments   EKG EKG Interpretation Date/Time:  Saturday October 08 2023 03:35:54 EDT Ventricular Rate:  66 PR Interval:  129 QRS Duration:  95 QT Interval:  469 QTC Calculation: 492 R Axis:   56  Text Interpretation: Sinus rhythm Borderline prolonged QT interval Since last tracing QT has shortened Confirmed by Roselyn Dunnings [phone]) on 10/08/2023 3:37:00 AM  Procedures Procedures  Medications Ordered in the ED Medications  ondansetron  (ZOFRAN ) injection 4 mg (has no administration in time range)  ondansetron  (ZOFRAN -ODT) disintegrating tablet 4 mg (4 mg Oral Given 10/08/23 0012)  haloperidol  lactate (HALDOL ) injection 5 mg (5 mg Intravenous Given 10/08/23 0402)  lactated ringers  bolus 1,000 mL (1,000 mLs Intravenous New Bag/Given 10/08/23 0403)    Initial Impression and Plan  Patient here with vomiting, similar to multiple previous ED visits and not improved with Zofran  given in triage. Apparently she had an IV in her hand but she took it out. Labs done in triage show CBC with leukocytosis, similar to previous. BMP with mild hypokalemia, Lipase is normal. Will check EKG before a dose of droperidol  if QT is within reason.   ED Course   Clinical Course as of 10/08/23 0553  Sat Oct 08, 2023  0337 EKG with prolonged QT, K is also a bit low. Will give Haldol  instead.  [CS]  [id number] Patient feeling better, no long vomiting and ready to go home. Recommend she avoid NSAIDs and THC, follow up with PCP/GI and RTED for any other concerns.  [CS]    Clinical Course User Index [CS] Roselyn Dunnings NOVAK, MD     MDM  Rules/Calculators/A&P Medical Decision Making Given presenting complaint, I considered that admission might be necessary. After review of results from ED lab and/or imaging studies, admission to the hospital is not indicated at this time.    Problems Addressed: Cyclic vomiting syndrome: chronic illness or injury with exacerbation, progression, or side effects of treatment  Amount and/or Complexity of Data Reviewed Labs: ordered. Decision-making details documented in ED Course.  Risk Prescription drug management. Decision regarding hospitalization.     Final Clinical Impression(s) / ED Diagnoses Final diagnoses:  Cyclic vomiting syndrome    Rx / DC Orders ED Discharge Orders  Ordered    ondansetron  (ZOFRAN -ODT) 4 MG disintegrating tablet  Every 8 hours PRN        10/08/23 0552             Roselyn Carlin NOVAK, MD 10/08/23 [phone]

## 2023-10-08 NOTE — Discharge Instructions (Addendum)
 Please avoid using oral pain medications such as BC or Goody powders, ibuprofen , etc as these may cause stomach inflammation. Avoid any marijuana or other THC use.

## 2023-10-08 NOTE — ED Notes (Signed)
 RN to bedside d/t pump alarming, RN found pt to have unhooked self from IV to use bathroom and hooked self back up to pump. Pt educated that she should not do that d/t increase chance of infection. Pt verbalized understanding.

## 2023-10-08 NOTE — ED Notes (Signed)
 PIV removed by pt d/t being restless, turning in bed, flinging blankets. New PIV placed for med admin. See emar. Pt refusing to keep VS equipment on d/t being uncomfortable will attempt to recheck VS in an hour.

## 2023-10-09 ENCOUNTER — Emergency Department (HOSPITAL_BASED_OUTPATIENT_CLINIC_OR_DEPARTMENT_OTHER)
Admission: EM | Admit: 2023-10-09 | Discharge: 2023-10-09 | Disposition: A | Discharge status: Home / Self Care | Payer: MEDICAID | Attending: Emergency Medicine | Admitting: Emergency Medicine

## 2023-10-09 ENCOUNTER — Other Ambulatory Visit: Payer: Self-pay

## 2023-10-09 ENCOUNTER — Encounter (HOSPITAL_BASED_OUTPATIENT_CLINIC_OR_DEPARTMENT_OTHER): Payer: Self-pay | Admitting: Radiology

## 2023-10-09 DIAGNOSIS — R1084 Generalized abdominal pain: Secondary | ICD-10-CM | POA: Diagnosis not present

## 2023-10-09 DIAGNOSIS — R112 Nausea with vomiting, unspecified: Secondary | ICD-10-CM | POA: Diagnosis present

## 2023-10-09 LAB — COMPREHENSIVE METABOLIC PANEL WITH GFR
ALT: 23 U/L (ref 0–44)
AST: 32 U/L (ref 15–41)
Albumin: 4.8 g/dL (ref 3.5–5.0)
Alkaline Phosphatase: 90 U/L (ref 38–126)
Anion gap: 17 — ABNORMAL HIGH (ref 5–15)
BUN: 9 mg/dL (ref 6–20)
CO2: 21 mmol/L — ABNORMAL LOW (ref 22–32)
Calcium: 9.6 mg/dL (ref 8.9–10.3)
Chloride: 97 mmol/L — ABNORMAL LOW (ref 98–111)
Creatinine, Ser: 0.74 mg/dL (ref 0.44–1.00)
GFR, Estimated: >60 mL/min (ref >60)
Glucose, Bld: 126 mg/dL — ABNORMAL HIGH (ref 70–99)
Potassium: 3.2 mmol/L — ABNORMAL LOW (ref 3.5–5.1)
Sodium: 135 mmol/L (ref 135–145)
Total Bilirubin: 1.2 mg/dL (ref 0.0–1.2)
Total Protein: 7.9 g/dL (ref 6.5–8.1)

## 2023-10-09 LAB — CBC WITH DIFFERENTIAL/PLATELET
Abs Immature Granulocytes: 0.09 10*3/uL — ABNORMAL HIGH (ref 0.00–0.07)
Basophils Absolute: 0 10*3/uL (ref 0.0–0.1)
Basophils Relative: 0 %
Eosinophils Absolute: 0 10*3/uL (ref 0.0–0.5)
Eosinophils Relative: 0 %
HCT: 38.5 % (ref 36.0–46.0)
Hemoglobin: 13.5 g/dL (ref 12.0–15.0)
Immature Granulocytes: 1 %
Lymphocytes Relative: 11 %
Lymphs Abs: 1.9 10*3/uL (ref 0.7–4.0)
MCH: 28.5 pg (ref 26.0–34.0)
MCHC: 35.1 g/dL (ref 30.0–36.0)
MCV: 81.4 fL (ref 80.0–100.0)
Monocytes Absolute: 1.7 10*3/uL — ABNORMAL HIGH (ref 0.1–1.0)
Monocytes Relative: 10 %
Neutro Abs: 14 10*3/uL — ABNORMAL HIGH (ref 1.7–7.7)
Neutrophils Relative %: 78 %
Platelets: 318 10*3/uL (ref 150–400)
RBC: 4.73 MIL/uL (ref 3.87–5.11)
RDW: 12.8 % (ref 11.5–15.5)
WBC: 17.8 10*3/uL — ABNORMAL HIGH (ref 4.0–10.5)
nRBC: 0 % (ref 0.0–0.2)

## 2023-10-09 LAB — MAGNESIUM: Magnesium: 2 mg/dL (ref 1.7–2.4)

## 2023-10-09 MED ORDER — POTASSIUM CHLORIDE CRYS ER 20 MEQ PO TBCR
40.0000 meq | EXTENDED_RELEASE_TABLET | Freq: Once | ORAL | Status: AC
  Administered 2023-10-09: 40 meq via ORAL
  Filled 2023-10-09: qty 2

## 2023-10-09 MED ORDER — MAGNESIUM SULFATE 50 % IJ SOLN: 1.0000 g | Freq: Once | INTRAMUSCULAR | Status: DC

## 2023-10-09 MED ORDER — METOCLOPRAMIDE HCL 5 MG/ML IJ SOLN
10.0000 mg | Freq: Once | INTRAMUSCULAR | Status: AC
  Administered 2023-10-09: 10 mg via INTRAVENOUS
  Filled 2023-10-09: qty 2

## 2023-10-09 MED ORDER — MAGNESIUM SULFATE IN D5W 1-5 GM/100ML-% IV SOLN
1.0000 g | Freq: Once | INTRAVENOUS | Status: AC
  Administered 2023-10-09: 1 g via INTRAVENOUS
  Filled 2023-10-09: qty 100

## 2023-10-09 MED ORDER — HALOPERIDOL LACTATE 5 MG/ML IJ SOLN
5.0000 mg | Freq: Once | INTRAMUSCULAR | Status: AC
  Administered 2023-10-09: 5 mg via INTRAVENOUS
  Filled 2023-10-09: qty 1

## 2023-10-09 MED ORDER — DIPHENHYDRAMINE HCL 50 MG/ML IJ SOLN
25.0000 mg | Freq: Once | INTRAMUSCULAR | Status: AC
  Administered 2023-10-09: 25 mg via INTRAVENOUS
  Filled 2023-10-09: qty 1

## 2023-10-09 NOTE — ED Provider Notes (Signed)
 [place] [facility] Provider Note   CSN: [id number] Arrival date & time: 10/09/23  0025     Patient presents with: Emesis   Christine Trujillo is a 28 y.o. female.   Patient presents to the emergency department stating that she is still experiencing nausea and vomiting.  Patient with history of cyclic vomiting syndrome, suspected to be cannabinoid hyperemesis.  She reports that she was seen in the emergency department twice yesterday, continues to have symptoms.       Prior to Admission medications   Medication Sig Start Date End Date Taking? Authorizing Provider  capsaicin  (ZOSTRIX) 0.025 % cream Apply over abdomen twice daily as needed for nausea. 06/27/23   Frann Mabel Mt, DO  DULoxetine  (CYMBALTA ) 20 MG capsule Take 1 capsule (20 mg total) by mouth daily. 01/31/23   Frann Mabel Mt, DO  famotidine  (PEPCID ) 20 MG tablet Take 1 tablet (20 mg total) by mouth 2 (two) times daily. 07/11/23   Frann Mabel Mt, DO  levocetirizine (XYZAL ) 5 MG tablet Take 1 tablet (5 mg total) by mouth every evening. 07/04/23   Wendling, Mabel Mt, DO  loperamide  (IMODIUM  A-D) 2 MG tablet Take 1 tablet (2 mg total) by mouth 4 (four) times daily as needed for diarrhea or loose stools. 06/17/23   Roemhildt, Lorin T, PA-C  meloxicam  (MOBIC ) 15 MG tablet Take 1 tablet (15 mg total) by mouth daily. 04/11/23   Frann Mabel Mt, DO  metoCLOPramide  (REGLAN ) 10 MG tablet Take 1 tablet (10 mg total) by mouth every 6 (six) hours. 07/11/23   Frann Mabel Mt, DO  Multiple Vitamins-Minerals (MULTIVITAMIN WITH MINERALS) tablet Take 1 tablet by mouth daily.    [provider]  nicotine  (NICODERM CQ  - DOSED IN MG/24 HR) 7 mg/24hr patch Place 1 patch (7 mg total) onto the skin daily. 03/28/23   Frann Mabel Mt, DO  norgestimate -ethinyl estradiol  (SPRINTEC 28) 0.25-35 MG-MCG tablet Take 1 tab daily for 7 days in event of breakthrough  bleeding. 09/23/23   Frann Mabel Mt, DO  ondansetron  (ZOFRAN -ODT) 4 MG disintegrating tablet Take 1 tablet (4 mg total) by mouth every 8 (eight) hours as needed for nausea or vomiting. 10/08/23   Roselyn Carlin NOVAK, MD  pantoprazole  (PROTONIX ) 40 MG tablet Take 1 tablet (40 mg total) by mouth daily. 01/31/23   Frann Mabel Mt, DO  potassium chloride  (KLOR-CON  10) 10 MEQ tablet Take 2 tablets (20 mEq total) by mouth daily. 02/01/23   Frann Mabel Mt, DO  promethazine  (PHENERGAN ) 25 MG suppository Place 1 suppository (25 mg total) rectally every 6 (six) hours as needed for nausea or vomiting. 06/23/23   Kehrli, Kelsey F, PA-C  triamcinolone  cream (KENALOG ) 0.1 % Apply 1 Application topically 2 (two) times daily. 07/10/23   Frann Mabel Mt, DO  Varenicline  Tartrate, Starter, (CHANTIX  STARTING MONTH PAK) 0.5 MG X 11 & 1 MG X 42 TBPK Follow instructions on package. 07/11/23   Frann Mabel Mt, DO    Allergies: Patient has no known allergies.    Review of Systems  Updated Vital Signs BP (!) 148/105   Pulse 90   Temp (!) 97 F (36.1 C)   Resp 20   Ht 5' 3 (1.6 m)   Wt 113.4 kg   SpO2 99%   BMI 44.29 kg/m   Physical Exam Vitals and nursing note reviewed.  Constitutional:      General: She is not in acute distress.    Appearance: She is  well-developed.  HENT:     Head: Normocephalic and atraumatic.     Mouth/Throat:     Mouth: Mucous membranes are moist.   Eyes:     General: Vision grossly intact. Gaze aligned appropriately.     Extraocular Movements: Extraocular movements intact.     Conjunctiva/sclera: Conjunctivae normal.    Cardiovascular:     Rate and Rhythm: Normal rate and regular rhythm.     Pulses: Normal pulses.     Heart sounds: Normal heart sounds, S1 normal and S2 normal. No murmur heard.    No friction rub. No gallop.  Pulmonary:     Effort: Pulmonary effort is normal. No respiratory distress.     Breath sounds: Normal breath  sounds.  Abdominal:     General: Bowel sounds are normal.     Palpations: Abdomen is soft.     Tenderness: There is generalized abdominal tenderness. There is no guarding or rebound.     Hernia: No hernia is present.   Musculoskeletal:        General: No swelling.     Cervical back: Full passive range of motion without pain, normal range of motion and neck supple. No spinous process tenderness or muscular tenderness. Normal range of motion.     Right lower leg: No edema.     Left lower leg: No edema.   Skin:    General: Skin is warm and dry.     Capillary Refill: Capillary refill takes less than 2 seconds.     Findings: No ecchymosis, erythema, rash or wound.   Neurological:     General: No focal deficit present.     Mental Status: She is alert and oriented to person, place, and time.     GCS: GCS eye subscore is 4. GCS verbal subscore is 5. GCS motor subscore is 6.     Cranial Nerves: Cranial nerves 2-12 are intact.     Sensory: Sensation is intact.     Motor: Motor function is intact.     Coordination: Coordination is intact.   Psychiatric:        Attention and Perception: Attention normal.        Mood and Affect: Mood normal.        Speech: Speech normal.        Behavior: Behavior normal.     (all labs ordered are listed, but only abnormal results are displayed) Labs Reviewed  CBC WITH DIFFERENTIAL/PLATELET - Abnormal; Notable for the following components:      Result Value   WBC 17.8 (*)    Neutro Abs 14.0 (*)    Monocytes Absolute 1.7 (*)    Abs Immature Granulocytes 0.09 (*)    All other components within normal limits  COMPREHENSIVE METABOLIC PANEL WITH GFR - Abnormal; Notable for the following components:   Potassium 3.2 (*)    Chloride 97 (*)    CO2 21 (*)    Glucose, Bld 126 (*)    Anion gap 17 (*)    All other components within normal limits  MAGNESIUM     EKG: None  Radiology: No results found.   Procedures   Medications Ordered in the ED   metoCLOPramide  (REGLAN ) injection 10 mg (10 mg Intravenous Given 10/09/23 0304)  diphenhydrAMINE  (BENADRYL ) injection 25 mg (25 mg Intravenous Given 10/09/23 0301)  haloperidol  lactate (HALDOL ) injection 5 mg (5 mg Intravenous Given 10/09/23 0302)  potassium chloride  SA (KLOR-CON  M) CR tablet 40 mEq (40 mEq Oral Given 10/09/23 0305)  magnesium  sulfate IVPB 1 g 100 mL (0 g Intravenous Stopped 10/09/23 0429)                                    Medical Decision Making Amount and/or Complexity of Data Reviewed External Data Reviewed: labs, ECG and notes. Labs: ordered.  Risk Prescription drug management.   Patient with history of cyclic vomiting syndrome.  She was seen in this ED yesterday.  She reports after she left [facility].  Both visits reviewed.  Vital signs are unremarkable here in the ED.  No tachycardia or hypotension.  No fever.  Patient does not appear to be in distress.  Abdominal exam is soft.  She reports tenderness diffusely but no guarding, rebound or signs of an acute surgical process.     Final diagnoses:  Nausea and vomiting, unspecified vomiting type    ED Discharge Orders     None          Haze Lonni PARAS, MD 10/09/23 [phone]

## 2023-10-09 NOTE — ED Triage Notes (Signed)
 Pt has been vomiting since yesterday. Pt ws seen for the same yesterday. Pt states her diarrhea has stopped. She is complaining of cramps and constant vomiting. She is unable to keep anything down. Pt states she was given a prescription but she was unable to pick it up because the pharmacy was closed.

## 2023-10-10 ENCOUNTER — Encounter (HOSPITAL_BASED_OUTPATIENT_CLINIC_OR_DEPARTMENT_OTHER): Payer: Self-pay | Admitting: Emergency Medicine

## 2023-10-10 ENCOUNTER — Other Ambulatory Visit: Payer: Self-pay

## 2023-10-10 ENCOUNTER — Other Ambulatory Visit (HOSPITAL_BASED_OUTPATIENT_CLINIC_OR_DEPARTMENT_OTHER): Payer: Self-pay

## 2023-10-10 ENCOUNTER — Observation Stay (HOSPITAL_BASED_OUTPATIENT_CLINIC_OR_DEPARTMENT_OTHER)
Admission: EM | Admit: 2023-10-10 | Discharge: 2023-10-12 | Disposition: A | Discharge status: Home / Self Care | Payer: MEDICAID | Attending: Internal Medicine | Admitting: Internal Medicine

## 2023-10-10 DIAGNOSIS — R1110 Vomiting, unspecified: Principal | ICD-10-CM

## 2023-10-10 DIAGNOSIS — I10 Essential (primary) hypertension: Secondary | ICD-10-CM | POA: Diagnosis not present

## 2023-10-10 DIAGNOSIS — K219 Gastro-esophageal reflux disease without esophagitis: Secondary | ICD-10-CM | POA: Insufficient documentation

## 2023-10-10 DIAGNOSIS — Z720 Tobacco use: Secondary | ICD-10-CM | POA: Diagnosis present

## 2023-10-10 DIAGNOSIS — F411 Generalized anxiety disorder: Secondary | ICD-10-CM | POA: Insufficient documentation

## 2023-10-10 DIAGNOSIS — F17210 Nicotine dependence, cigarettes, uncomplicated: Secondary | ICD-10-CM | POA: Insufficient documentation

## 2023-10-10 DIAGNOSIS — Z743 Need for continuous supervision: Secondary | ICD-10-CM | POA: Diagnosis not present

## 2023-10-10 DIAGNOSIS — R112 Nausea with vomiting, unspecified: Secondary | ICD-10-CM | POA: Diagnosis present

## 2023-10-10 DIAGNOSIS — F339 Major depressive disorder, recurrent, unspecified: Secondary | ICD-10-CM | POA: Diagnosis not present

## 2023-10-10 DIAGNOSIS — F1210 Cannabis abuse, uncomplicated: Secondary | ICD-10-CM | POA: Insufficient documentation

## 2023-10-10 DIAGNOSIS — E876 Hypokalemia: Secondary | ICD-10-CM | POA: Diagnosis not present

## 2023-10-10 DIAGNOSIS — F1290 Cannabis use, unspecified, uncomplicated: Secondary | ICD-10-CM | POA: Diagnosis present

## 2023-10-10 DIAGNOSIS — F17220 Nicotine dependence, chewing tobacco, uncomplicated: Secondary | ICD-10-CM | POA: Insufficient documentation

## 2023-10-10 LAB — CBC WITH DIFFERENTIAL/PLATELET
Abs Immature Granulocytes: 0.07 10*3/uL (ref 0.00–0.07)
Basophils Absolute: 0 10*3/uL (ref 0.0–0.1)
Basophils Relative: 0 %
Eosinophils Absolute: 0 10*3/uL (ref 0.0–0.5)
Eosinophils Relative: 0 %
HCT: 39.8 % (ref 36.0–46.0)
Hemoglobin: 13.7 g/dL (ref 12.0–15.0)
Immature Granulocytes: 1 %
Lymphocytes Relative: 20 %
Lymphs Abs: 3 10*3/uL (ref 0.7–4.0)
MCH: 28.2 pg (ref 26.0–34.0)
MCHC: 34.4 g/dL (ref 30.0–36.0)
MCV: 82.1 fL (ref 80.0–100.0)
Monocytes Absolute: 1.5 10*3/uL — ABNORMAL HIGH (ref 0.1–1.0)
Monocytes Relative: 10 %
Neutro Abs: 10 10*3/uL — ABNORMAL HIGH (ref 1.7–7.7)
Neutrophils Relative %: 69 %
Platelets: 327 10*3/uL (ref 150–400)
RBC: 4.85 MIL/uL (ref 3.87–5.11)
RDW: 12.8 % (ref 11.5–15.5)
WBC: 14.7 10*3/uL — ABNORMAL HIGH (ref 4.0–10.5)
nRBC: 0 % (ref 0.0–0.2)

## 2023-10-10 LAB — COMPREHENSIVE METABOLIC PANEL WITH GFR
ALT: 31 U/L (ref 0–44)
AST: 33 U/L (ref 15–41)
Albumin: 4.7 g/dL (ref 3.5–5.0)
Alkaline Phosphatase: 84 U/L (ref 38–126)
Anion gap: 18 — ABNORMAL HIGH (ref 5–15)
BUN: 14 mg/dL (ref 6–20)
CO2: 20 mmol/L — ABNORMAL LOW (ref 22–32)
Calcium: 9.3 mg/dL (ref 8.9–10.3)
Chloride: 97 mmol/L — ABNORMAL LOW (ref 98–111)
Creatinine, Ser: 0.98 mg/dL (ref 0.44–1.00)
GFR, Estimated: >60 mL/min (ref >60)
Glucose, Bld: 136 mg/dL — ABNORMAL HIGH (ref 70–99)
Potassium: 2.9 mmol/L — ABNORMAL LOW (ref 3.5–5.1)
Sodium: 134 mmol/L — ABNORMAL LOW (ref 135–145)
Total Bilirubin: 1.2 mg/dL (ref 0.0–1.2)
Total Protein: 7.6 g/dL (ref 6.5–8.1)

## 2023-10-10 LAB — LIPASE, BLOOD: Lipase: 19 U/L (ref 11–51)

## 2023-10-10 LAB — BASIC METABOLIC PANEL WITH GFR
Anion gap: 10 (ref 5–15)
BUN: 11 mg/dL (ref 6–20)
CO2: 22 mmol/L (ref 22–32)
Calcium: 8.5 mg/dL — ABNORMAL LOW (ref 8.9–10.3)
Chloride: 101 mmol/L (ref 98–111)
Creatinine, Ser: 0.66 mg/dL (ref 0.44–1.00)
GFR, Estimated: >60 mL/min (ref >60)
Glucose, Bld: 129 mg/dL — ABNORMAL HIGH (ref 70–99)
Potassium: 3.1 mmol/L — ABNORMAL LOW (ref 3.5–5.1)
Sodium: 133 mmol/L — ABNORMAL LOW (ref 135–145)

## 2023-10-10 LAB — MAGNESIUM: Magnesium: 2.2 mg/dL (ref 1.7–2.4)

## 2023-10-10 MED ORDER — HALOPERIDOL LACTATE 5 MG/ML IJ SOLN
5.0000 mg | Freq: Once | INTRAMUSCULAR | Status: AC
  Administered 2023-10-10: 5 mg via INTRAVENOUS
  Filled 2023-10-10: qty 1

## 2023-10-10 MED ORDER — METOPROLOL TARTRATE 5 MG/5ML IV SOLN
5.0000 mg | Freq: Once | INTRAVENOUS | Status: AC
  Administered 2023-10-10: 5 mg via INTRAVENOUS
  Filled 2023-10-10: qty 5

## 2023-10-10 MED ORDER — METOCLOPRAMIDE HCL 5 MG/ML IJ SOLN
10.0000 mg | Freq: Three times a day (TID) | INTRAMUSCULAR | Status: DC | PRN
  Administered 2023-10-10: 10 mg via INTRAVENOUS
  Filled 2023-10-10: qty 2

## 2023-10-10 MED ORDER — ACETAMINOPHEN 325 MG PO TABS
650.0000 mg | ORAL_TABLET | Freq: Four times a day (QID) | ORAL | Status: DC | PRN
Start: 2023-10-10 — End: 2023-10-12
  Administered 2023-10-12: 650 mg via ORAL
  Filled 2023-10-10: qty 2

## 2023-10-10 MED ORDER — SODIUM CHLORIDE 0.9 % IV SOLN: Freq: Once | INTRAVENOUS | Status: AC

## 2023-10-10 MED ORDER — POTASSIUM CHLORIDE CRYS ER 20 MEQ PO TBCR
40.0000 meq | EXTENDED_RELEASE_TABLET | Freq: Once | ORAL | Status: DC
  Filled 2023-10-10: qty 2

## 2023-10-10 MED ORDER — PANTOPRAZOLE SODIUM 40 MG IV SOLR
40.0000 mg | Freq: Once | INTRAVENOUS | Status: AC
  Administered 2023-10-10: 40 mg via INTRAVENOUS
  Filled 2023-10-10: qty 10

## 2023-10-10 MED ORDER — POTASSIUM CHLORIDE 10 MEQ/100ML IV SOLN
10.0000 meq | INTRAVENOUS | Status: AC
  Administered 2023-10-10 (×2): 10 meq via INTRAVENOUS
  Filled 2023-10-10 (×2): qty 100

## 2023-10-10 MED ORDER — HYDRALAZINE HCL 20 MG/ML IJ SOLN
20.0000 mg | Freq: Once | INTRAMUSCULAR | Status: AC
  Administered 2023-10-10: 20 mg via INTRAVENOUS
  Filled 2023-10-10: qty 1

## 2023-10-10 MED ORDER — ONDANSETRON HCL 4 MG PO TABS: 4.0000 mg | ORAL_TABLET | Freq: Four times a day (QID) | ORAL | Status: DC | PRN

## 2023-10-10 MED ORDER — LACTATED RINGERS IV BOLUS
1000.0000 mL | Freq: Once | INTRAVENOUS | Status: AC
  Administered 2023-10-10: 1000 mL via INTRAVENOUS

## 2023-10-10 MED ORDER — ACETAMINOPHEN 650 MG RE SUPP: 650.0000 mg | Freq: Four times a day (QID) | RECTAL | Status: DC | PRN

## 2023-10-10 MED ORDER — MAGNESIUM SULFATE 2 GM/50ML IV SOLN
2.0000 g | Freq: Once | INTRAVENOUS | Status: AC
  Administered 2023-10-10: 2 g via INTRAVENOUS
  Filled 2023-10-10: qty 50

## 2023-10-10 MED ORDER — PROCHLORPERAZINE EDISYLATE 10 MG/2ML IJ SOLN
10.0000 mg | Freq: Once | INTRAMUSCULAR | Status: AC
  Administered 2023-10-10: 10 mg via INTRAVENOUS
  Filled 2023-10-10: qty 2

## 2023-10-10 MED ORDER — METOCLOPRAMIDE HCL 5 MG/ML IJ SOLN
10.0000 mg | Freq: Four times a day (QID) | INTRAMUSCULAR | Status: DC
  Administered 2023-10-10 – 2023-10-12 (×8): 10 mg via INTRAVENOUS
  Filled 2023-10-10 (×8): qty 2

## 2023-10-10 MED ORDER — NICOTINE 14 MG/24HR TD PT24: 14.0000 mg | MEDICATED_PATCH | Freq: Every day | TRANSDERMAL | Status: DC | PRN

## 2023-10-10 MED ORDER — ENOXAPARIN SODIUM 40 MG/0.4ML IJ SOSY
40.0000 mg | PREFILLED_SYRINGE | INTRAMUSCULAR | Status: DC
  Administered 2023-10-10: 40 mg via SUBCUTANEOUS
  Filled 2023-10-10 (×2): qty 0.4

## 2023-10-10 MED ORDER — HYDRALAZINE HCL 20 MG/ML IJ SOLN: 20.0000 mg | INTRAMUSCULAR | Status: DC | PRN

## 2023-10-10 MED ORDER — POTASSIUM CHLORIDE IN NACL 40-0.9 MEQ/L-% IV SOLN
INTRAVENOUS | Status: AC
  Filled 2023-10-10: qty 1000

## 2023-10-10 MED ORDER — ONDANSETRON HCL 4 MG/2ML IJ SOLN
4.0000 mg | Freq: Four times a day (QID) | INTRAMUSCULAR | Status: DC | PRN
Start: 2023-10-10 — End: 2023-10-12
  Administered 2023-10-10 – 2023-10-12 (×3): 4 mg via INTRAVENOUS
  Filled 2023-10-10 (×3): qty 2

## 2023-10-10 NOTE — ED Notes (Signed)
 Reports called to [place] , Charity fundraiser at [employer]

## 2023-10-10 NOTE — ED Triage Notes (Addendum)
 Patient c/o N/V/D. Patient report unable to keep anything down x3 days.Patient report loose stool x 5 since yesterday. Patient report taking PRN nausea medicine without relief.

## 2023-10-10 NOTE — Plan of Care (Signed)

## 2023-10-10 NOTE — ED Provider Notes (Signed)
 [facility]  Provider Note  CSN: [id number] Arrival date & time: 10/10/23 0459  History Chief Complaint  Patient presents with   Emesis   Nausea    Christine Trujillo is a 28 y.o. female with history of cyclic vomiting, suspected cannabinoid hyperemesis here for re-evaluation of continued vomiting, diarrhea and abdominal pain. This is her 5th ED visit in 3 days for same, third visit here and two visits at [facility]. She has smoked THC recently. Has also been taking BC powders for a toothache.    Home Medications Prior to Admission medications   Medication Sig Start Date End Date Taking? Authorizing Provider  capsaicin  (ZOSTRIX) 0.025 % cream Apply over abdomen twice daily as needed for nausea. 06/27/23   Frann Mabel Mt, DO  DULoxetine  (CYMBALTA ) 20 MG capsule Take 1 capsule (20 mg total) by mouth daily. 01/31/23   Frann Mabel Mt, DO  famotidine  (PEPCID ) 20 MG tablet Take 1 tablet (20 mg total) by mouth 2 (two) times daily. 07/11/23   Frann Mabel Mt, DO  levocetirizine (XYZAL ) 5 MG tablet Take 1 tablet (5 mg total) by mouth every evening. 07/04/23   Wendling, Mabel Mt, DO  loperamide  (IMODIUM  A-D) 2 MG tablet Take 1 tablet (2 mg total) by mouth 4 (four) times daily as needed for diarrhea or loose stools. 06/17/23   Roemhildt, Lorin T, PA-C  meloxicam  (MOBIC ) 15 MG tablet Take 1 tablet (15 mg total) by mouth daily. 04/11/23   Frann Mabel Mt, DO  metoCLOPramide  (REGLAN ) 10 MG tablet Take 1 tablet (10 mg total) by mouth every 6 (six) hours. 07/11/23   Frann Mabel Mt, DO  Multiple Vitamins-Minerals (MULTIVITAMIN WITH MINERALS) tablet Take 1 tablet by mouth daily.    [provider]  nicotine  (NICODERM CQ  - DOSED IN MG/24 HR) 7 mg/24hr patch Place 1 patch (7 mg total) onto the skin daily. 03/28/23   Frann Mabel Mt, DO  norgestimate -ethinyl estradiol  (SPRINTEC 28) 0.25-35 MG-MCG tablet Take 1  tab daily for 7 days in event of breakthrough bleeding. 09/23/23   Frann Mabel Mt, DO  ondansetron  (ZOFRAN -ODT) 4 MG disintegrating tablet Take 1 tablet (4 mg total) by mouth every 8 (eight) hours as needed for nausea or vomiting. 10/08/23   Roselyn Carlin NOVAK, MD  pantoprazole  (PROTONIX ) 40 MG tablet Take 1 tablet (40 mg total) by mouth daily. 01/31/23   Frann Mabel Mt, DO  potassium chloride  (KLOR-CON  10) 10 MEQ tablet Take 2 tablets (20 mEq total) by mouth daily. 02/01/23   Frann Mabel Mt, DO  promethazine  (PHENERGAN ) 25 MG suppository Place 1 suppository (25 mg total) rectally every 6 (six) hours as needed for nausea or vomiting. 06/23/23   Kehrli, Kelsey F, PA-C  triamcinolone  cream (KENALOG ) 0.1 % Apply 1 Application topically 2 (two) times daily. 07/10/23   Frann Mabel Mt, DO  Varenicline  Tartrate, Starter, (CHANTIX  STARTING MONTH PAK) 0.5 MG X 11 & 1 MG X 42 TBPK Follow instructions on package. 07/11/23   Frann Mabel Mt, DO     Allergies    Patient has no known allergies.   Review of Systems   Review of Systems Please see HPI for pertinent positives and negatives  Physical Exam BP (!) 172/101   Pulse 64   Temp 98.3 F (36.8 C)   Resp 16   Ht 5' 3 (1.6 m)   Wt 113.4 kg   SpO2 100%   BMI 44.29 kg/m   Physical Exam Vitals  and nursing note reviewed.  Constitutional:      Appearance: Normal appearance. She is obese.  HENT:     Head: Normocephalic and atraumatic.     Nose: Nose normal.     Mouth/Throat:     Mouth: Mucous membranes are dry.   Eyes:     Extraocular Movements: Extraocular movements intact.     Conjunctiva/sclera: Conjunctivae normal.    Cardiovascular:     Rate and Rhythm: Normal rate.  Pulmonary:     Effort: Pulmonary effort is normal.     Breath sounds: Normal breath sounds.  Abdominal:     General: Abdomen is flat.     Palpations: Abdomen is soft.     Tenderness: There is abdominal tenderness. There is no  guarding.   Musculoskeletal:        General: No swelling. Normal range of motion.     Cervical back: Neck supple.   Skin:    General: Skin is warm and dry.   Neurological:     General: No focal deficit present.     Mental Status: She is alert.   Psychiatric:        Mood and Affect: Mood normal.     ED Results / Procedures / Treatments   EKG None  Procedures Procedures  Medications Ordered in the ED Medications  potassium chloride  SA (KLOR-CON  M) CR tablet 40 mEq (40 mEq Oral Patient Refused/Not Given 10/10/23 0601)  potassium chloride  10 mEq in 100 mL IVPB (10 mEq Intravenous New Bag/Given 10/10/23 0601)  lactated ringers  bolus 1,000 mL (0 mLs Intravenous Stopped 10/10/23 0601)  haloperidol  lactate (HALDOL ) injection 5 mg (5 mg Intravenous Given 10/10/23 0520)  0.9 %  sodium chloride  infusion ( Intravenous New Bag/Given 10/10/23 0602)    Initial Impression and Plan  Patient here for intractable vomiting. Similar presentation in the past. Will check labs and give IVF/Antiemetics for comfort. Anticipate admission for persistent symptoms now x 3-4 days.   ED Course   Clinical Course as of 10/10/23 0630  Mon Oct 10, 2023  0526 CBC with mild leukocytosis improved from recent ED visits.  [CS]  0546 CMP with mod hypokalemia, will replete orally and IV. Mag and lipase are normal. Given multiple ED visits in recent days for intractable symptoms, will discuss admission with Hospitalist.  [CS]  0630 Spoke with Dr. Shona, hospitalist, who will accept for admission.  [CS]    Clinical Course User Index [CS] Roselyn Carlin NOVAK, MD     MDM Rules/Calculators/A&P Medical Decision Making Problems Addressed: Intractable vomiting: chronic illness or injury with exacerbation, progression, or side effects of treatment  Amount and/or Complexity of Data Reviewed Labs: ordered. Decision-making details documented in ED Course.  Risk Prescription drug management. Decision regarding  hospitalization.     Final Clinical Impression(s) / ED Diagnoses Final diagnoses:  Intractable vomiting    Rx / DC Orders ED Discharge Orders     None        Roselyn Carlin NOVAK, MD 10/10/23 0630

## 2023-10-10 NOTE — H&P (Signed)
 History and Physical    Patient: Christine Trujillo FMW:[id number] DOB: [birth year] DOA: 10/10/2023 DOS: the patient was seen and examined on 10/10/2023 PCP: Frann Mabel Mt, DO  Patient coming from: Home  Chief Complaint:  Chief Complaint  Patient presents with   Emesis   Nausea   HPI: Christine Trujillo is a 28 y.o. female with medical history significant of anxiety, cannabinoid hyperemesis syndrome, depression, GERD, gestational diabetes, ovarian cysts, pelvic pain, tobacco use who presented to the emergency department for the third time in the last 3 days due to nausea and vomiting due to cannabis use.  She has also had several episodes of diarrhea.  No abdominal pain, constipation, melena or hematochezia.  She denied fever, chills, rhinorrhea, sore throat, wheezing or hemoptysis.  No chest pain, palpitations, diaphoresis, PND, orthopnea or pitting edema of the lower extremities. No flank pain, dysuria, frequency or hematuria.  No polyuria, polydipsia, polyphagia or blurred vision.   Lab work: Lab work: CBC showed white count 14.7, Mono 13.7 g/dL platelets 672. Magnesium  is 2.2 mg/dL and lipase 19 units/L.  CMP showed sodium 134, potassium 2.9, chloride 97 and CO2 20 mmol/L with an anion gap of 18.  Glucose 136 mg/dL.  Calcium, renal function and LFTs were normal.   ED course: Initial vital signs were temperature 98.3 F, pulse 64, respirations 16, BP 172/101 mmHg and O2 sat 100% on room air.  Review of Systems: As mentioned in the history of present illness. All other systems reviewed and are negative. Past Medical History:  Diagnosis Date   Anxiety    Cannabinoid hyperemesis syndrome    Depression    GERD (gastroesophageal reflux disease)    History of anemia    during pregnancy   History of gestational diabetes    Ovarian cyst    left   Pelvic pain    Upper respiratory symptom    symptoms started 02-16-2021 cough/ runny nose;  pcp visit 03-10-2021 with sore throat/  congestion/ sob/ body aches/ cough but no fever, negative covid/ flu/ strep resultsin epic  (03-13-2021  pt stated today only has nonproductive cough)   Past Surgical History:  Procedure Laterality Date   BIOPSY  10/22/2020   Procedure: BIOPSY;  Surgeon: San Sandor GAILS, DO;  Location: [facility];  Service: Gastroenterology;;   CHOLECYSTECTOMY N/A 12/02/2016   Procedure: LAPAROSCOPIC CHOLECYSTECTOMY WITH INTRAOPERATIVE CHOLANGIOGRAM;  Surgeon: Ethyl Lenis, MD;  Location: [facility];  Service: General;  Laterality: N/A;   COLONOSCOPY WITH PROPOFOL  N/A 10/22/2020   Procedure: COLONOSCOPY WITH PROPOFOL ;  Surgeon: San Sandor GAILS, DO;  Location: [facility];  Service: Gastroenterology;  Laterality: N/A;   ESOPHAGOGASTRODUODENOSCOPY (EGD) WITH PROPOFOL  N/A 10/22/2020   Procedure: ESOPHAGOGASTRODUODENOSCOPY (EGD) WITH PROPOFOL ;  Surgeon: San Sandor GAILS, DO;  Location: [facility];  Service: Gastroenterology;  Laterality: N/A;   ESOPHAGOGASTRODUODENOSCOPY (EGD) WITH PROPOFOL  N/A 10/22/2020   Procedure: ESOPHAGOGASTRODUODENOSCOPY (EGD) WITH PROPOFOL ;  Surgeon: San Sandor GAILS, DO;  Location: [facility];  Service: Gastroenterology;  Laterality: N/A;   LAPAROSCOPIC OVARIAN CYSTECTOMY Left 03/17/2021   Procedure: LAPAROSCOPIC Resection of Adnexal Mass;  Surgeon: Corene Coy, MD;  [facility];  Service: Gynecology;  Laterality: Left;   LAPAROSCOPIC SALPINGO OOPHERECTOMY Bilateral 03/17/2021   Procedure: LAPAROSCOPIC BIALTERAL SALPINGECTOMY;  Surgeon: Corene Coy, MD;  Location[facility];  Service: Gynecology;  Laterality: Bilateral;   TUBAL LIGATION     pt unsure of affected side (procedure on 03/17/21)   WISDOM TOOTH EXTRACTION     Social  History:  reports that she has been smoking cigarettes. She has a 2.5 pack-year smoking history. She has never used smokeless tobacco. She reports current drug use. Frequency: 3.00 times  per week. Drug: Marijuana. She reports that she does not drink alcohol.  No Known Allergies  Family History  Problem Relation Age of Onset   Diabetes Mother    Heart disease Mother        CHF   Stomach cancer Brother    Colon cancer Neg Hx    Pancreatic cancer Neg Hx    Esophageal cancer Neg Hx    Liver disease Neg Hx     Prior to Admission medications   Medication Sig Start Date End Date Taking? Authorizing Provider  capsaicin  (ZOSTRIX) 0.025 % cream Apply over abdomen twice daily as needed for nausea. 06/27/23   Frann Mabel Mt, DO  DULoxetine  (CYMBALTA ) 20 MG capsule Take 1 capsule (20 mg total) by mouth daily. 01/31/23   Frann Mabel Mt, DO  famotidine  (PEPCID ) 20 MG tablet Take 1 tablet (20 mg total) by mouth 2 (two) times daily. 07/11/23   Frann Mabel Mt, DO  levocetirizine (XYZAL ) 5 MG tablet Take 1 tablet (5 mg total) by mouth every evening. 07/04/23   Wendling, Mabel Mt, DO  loperamide  (IMODIUM  A-D) 2 MG tablet Take 1 tablet (2 mg total) by mouth 4 (four) times daily as needed for diarrhea or loose stools. 06/17/23   Roemhildt, Lorin T, PA-C  meloxicam  (MOBIC ) 15 MG tablet Take 1 tablet (15 mg total) by mouth daily. 04/11/23   Frann Mabel Mt, DO  metoCLOPramide  (REGLAN ) 10 MG tablet Take 1 tablet (10 mg total) by mouth every 6 (six) hours. 07/11/23   Frann Mabel Mt, DO  Multiple Vitamins-Minerals (MULTIVITAMIN WITH MINERALS) tablet Take 1 tablet by mouth daily.    [provider]  nicotine  (NICODERM CQ  - DOSED IN MG/24 HR) 7 mg/24hr patch Place 1 patch (7 mg total) onto the skin daily. 03/28/23   Frann Mabel Mt, DO  norgestimate -ethinyl estradiol  (SPRINTEC 28) 0.25-35 MG-MCG tablet Take 1 tab daily for 7 days in event of breakthrough bleeding. 09/23/23   Frann Mabel Mt, DO  ondansetron  (ZOFRAN -ODT) 4 MG disintegrating tablet Take 1 tablet (4 mg total) by mouth every 8 (eight) hours as needed for nausea or  vomiting. 10/08/23   Roselyn Carlin NOVAK, MD  pantoprazole  (PROTONIX ) 40 MG tablet Take 1 tablet (40 mg total) by mouth daily. 01/31/23   Frann Mabel Mt, DO  potassium chloride  (KLOR-CON  10) 10 MEQ tablet Take 2 tablets (20 mEq total) by mouth daily. 02/01/23   Frann Mabel Mt, DO  promethazine  (PHENERGAN ) 25 MG suppository Place 1 suppository (25 mg total) rectally every 6 (six) hours as needed for nausea or vomiting. 06/23/23   Kehrli, Kelsey F, PA-C  triamcinolone  cream (KENALOG ) 0.1 % Apply 1 Application topically 2 (two) times daily. 07/10/23   Frann Mabel Mt, DO  Varenicline  Tartrate, Starter, (CHANTIX  STARTING MONTH PAK) 0.5 MG X 11 & 1 MG X 42 TBPK Follow instructions on package. 07/11/23   Frann Mabel Mt, DO    Physical Exam: Vitals:   10/10/23 0511 10/10/23 0513 10/10/23 0742 10/10/23 1235  BP: (!) 172/101  (!) 143/81 (!) 163/94  Pulse: 64  80 61  Resp: 16  17 17   Temp: 98.3 F (36.8 C)   98.2 F (36.8 C)  SpO2: 100%  100% 95%  Weight:  113.4 kg    Height:  5' 3 (1.6  m)     Physical Exam Vitals reviewed.  Constitutional:      General: She is awake. She is not in acute distress.    Appearance: She is ill-appearing.  HENT:     Head: Normocephalic.     Nose: No rhinorrhea.     Mouth/Throat:     Mouth: Mucous membranes are dry.   Eyes:     General: No scleral icterus.    Pupils: Pupils are equal, round, and reactive to light.   Neck:     Vascular: No JVD.   Cardiovascular:     Rate and Rhythm: Normal rate and regular rhythm.     Heart sounds: S1 normal and S2 normal.  Pulmonary:     Effort: Pulmonary effort is normal.     Breath sounds: Normal breath sounds. No wheezing, rhonchi or rales.  Abdominal:     General: There is no distension.     Palpations: Abdomen is soft.     Tenderness: There is no abdominal tenderness. There is no right CVA tenderness or left CVA tenderness.   Musculoskeletal:     Cervical back: Neck supple.      Right lower leg: No edema.     Left lower leg: No edema.   Skin:    General: Skin is warm and dry.   Neurological:     General: No focal deficit present.     Mental Status: She is alert and oriented to person, place, and time.   Psychiatric:        Mood and Affect: Mood normal.        Behavior: Behavior normal. Behavior is cooperative.     Data Reviewed:  Results are pending, will review when available.  Assessment and Plan: Principal Problem:   Intractable nausea and vomiting In the setting of:   Cannabinoid hyperemesis syndrome  Observation/MedSurg. Continue IV fluids. Clear liquid diet. Advance as tolerated. Analgesics as needed. Antiemetics as needed. Pantoprazole  40 mg IVP daily. Follow CBC, CMP in AM.  Active Problems:   Depression, recurrent (HCC)   GAD (generalized anxiety disorder) Currently not taking any medications.    Gastroesophageal reflux disease On pantoprazole  IV.    Hypokalemia Replacing. Magnesium  was supplemented. Follow-up potassium level in the morning.    Tobacco abuse Tobacco cessation advised. Nicotine  replacement therapy ordered.     Advance Care Planning:   Code Status: Full Code   Consults:   Family Communication:   Severity of Illness: The appropriate patient status for this patient is OBSERVATION. Observation status is judged to be reasonable and necessary in order to provide the required intensity of service to ensure the patient's safety. The patient's presenting symptoms, physical exam findings, and initial radiographic and laboratory data in the context of their medical condition is felt to place them at decreased risk for further clinical deterioration. Furthermore, it is anticipated that the patient will be medically stable for discharge from the hospital within 2 midnights of admission.   Author: Alm Dorn Castor, MD 10/10/2023 1:34 PM  For on call review [email or web address].   This document was prepared using Dragon  voice recognition software and may contain some unintended transcription errors.

## 2023-10-10 NOTE — Plan of Care (Signed)

## 2023-10-11 ENCOUNTER — Other Ambulatory Visit (HOSPITAL_BASED_OUTPATIENT_CLINIC_OR_DEPARTMENT_OTHER): Payer: Self-pay

## 2023-10-11 DIAGNOSIS — R112 Nausea with vomiting, unspecified: Secondary | ICD-10-CM | POA: Diagnosis not present

## 2023-10-11 LAB — RAPID URINE DRUG SCREEN, HOSP PERFORMED
Amphetamines: NOT DETECTED
Barbiturates: NOT DETECTED
Benzodiazepines: NOT DETECTED
Cocaine: NOT DETECTED
Opiates: POSITIVE — AB
Tetrahydrocannabinol: POSITIVE — AB

## 2023-10-11 LAB — CBC
HCT: 42.1 % (ref 36.0–46.0)
Hemoglobin: 13.9 g/dL (ref 12.0–15.0)
MCH: 27.9 pg (ref 26.0–34.0)
MCHC: 33 g/dL (ref 30.0–36.0)
MCV: 84.4 fL (ref 80.0–100.0)
Platelets: 309 10*3/uL (ref 150–400)
RBC: 4.99 MIL/uL (ref 3.87–5.11)
RDW: 13 % (ref 11.5–15.5)
WBC: 13.5 10*3/uL — ABNORMAL HIGH (ref 4.0–10.5)
nRBC: 0 % (ref 0.0–0.2)

## 2023-10-11 LAB — COMPREHENSIVE METABOLIC PANEL WITH GFR
ALT: 33 U/L (ref 0–44)
AST: 25 U/L (ref 15–41)
Albumin: 4.2 g/dL (ref 3.5–5.0)
Alkaline Phosphatase: 69 U/L (ref 38–126)
Anion gap: 12 (ref 5–15)
BUN: 7 mg/dL (ref 6–20)
CO2: 20 mmol/L — ABNORMAL LOW (ref 22–32)
Calcium: 9.1 mg/dL (ref 8.9–10.3)
Chloride: 102 mmol/L (ref 98–111)
Creatinine, Ser: 0.78 mg/dL (ref 0.44–1.00)
GFR, Estimated: >60 mL/min (ref >60)
Glucose, Bld: 117 mg/dL — ABNORMAL HIGH (ref 70–99)
Potassium: 3 mmol/L — ABNORMAL LOW (ref 3.5–5.1)
Sodium: 134 mmol/L — ABNORMAL LOW (ref 135–145)
Total Bilirubin: 1.4 mg/dL — ABNORMAL HIGH (ref 0.0–1.2)
Total Protein: 7.2 g/dL (ref 6.5–8.1)

## 2023-10-11 LAB — MAGNESIUM: Magnesium: 2.6 mg/dL — ABNORMAL HIGH (ref 1.7–2.4)

## 2023-10-11 LAB — PHOSPHORUS: Phosphorus: 2 mg/dL — ABNORMAL LOW (ref 2.5–4.6)

## 2023-10-11 MED ORDER — K PHOS MONO-SOD PHOS DI & MONO 155-852-130 MG PO TABS
500.0000 mg | ORAL_TABLET | Freq: Once | ORAL | Status: DC
  Filled 2023-10-11: qty 2

## 2023-10-11 MED ORDER — HYDRALAZINE HCL 20 MG/ML IJ SOLN: 10.0000 mg | INTRAMUSCULAR | Status: DC | PRN

## 2023-10-11 MED ORDER — SENNOSIDES-DOCUSATE SODIUM 8.6-50 MG PO TABS: 1.0000 | ORAL_TABLET | Freq: Every evening | ORAL | Status: DC | PRN

## 2023-10-11 MED ORDER — FAMOTIDINE 20 MG PO TABS
20.0000 mg | ORAL_TABLET | Freq: Two times a day (BID) | ORAL | Status: DC
  Administered 2023-10-11 – 2023-10-12 (×3): 20 mg via ORAL
  Filled 2023-10-11 (×3): qty 1

## 2023-10-11 MED ORDER — POTASSIUM CHLORIDE 10 MEQ/100ML IV SOLN
10.0000 meq | INTRAVENOUS | Status: AC
  Administered 2023-10-11 (×6): 10 meq via INTRAVENOUS
  Filled 2023-10-11 (×6): qty 100

## 2023-10-11 MED ORDER — METOPROLOL TARTRATE 5 MG/5ML IV SOLN: 5.0000 mg | INTRAVENOUS | Status: DC | PRN

## 2023-10-11 MED ORDER — GUAIFENESIN 100 MG/5ML PO LIQD: 5.0000 mL | ORAL | Status: DC | PRN

## 2023-10-11 MED ORDER — IPRATROPIUM-ALBUTEROL 0.5-2.5 (3) MG/3ML IN SOLN: 3.0000 mL | RESPIRATORY_TRACT | Status: DC | PRN

## 2023-10-11 MED ORDER — MORPHINE SULFATE (PF) 2 MG/ML IV SOLN
1.0000 mg | Freq: Once | INTRAVENOUS | Status: AC
  Administered 2023-10-11: 1 mg via INTRAVENOUS
  Filled 2023-10-11 (×2): qty 1

## 2023-10-11 NOTE — Progress Notes (Signed)
 This nurse heard a noise down the hall and went to see who and what was happening. Upon entering doorway, found patient's finger down throat forcibly making herself vomit. This nurse made patient's nurse aware of situation. Choking, vomiting noises heard from nurse's station.

## 2023-10-11 NOTE — Progress Notes (Signed)
 PROGRESS NOTE    Christine Trujillo  FMW:[id number] DOB: 27-Jul-1995 DOA: 10/10/2023 PCP: Frann Mabel Mt, DO    Brief Narrative:  28 y.o. female with medical history significant of anxiety, cannabinoid hyperemesis syndrome, depression, GERD, gestational diabetes, ovarian cysts, pelvic pain, tobacco use who presented to the emergency department for the third time in the last 3 days due to nausea and vomiting due to cannabis use.    Assessment & Plan:  Principal Problem:   Intractable nausea and vomiting Active Problems:   Depression, recurrent (HCC)   GAD (generalized anxiety disorder)   Gastroesophageal reflux disease   Hypokalemia   Tobacco abuse   Cannabinoid hyperemesis syndrome   Intractable nausea and vomiting   Cannabinoid hyperemesis syndrome  Will check UDS.  Suspect cannabinoid hyperemesis syndrome.  Reglan  every 6 hours, advance diet as tolerated, IV fluids     Depression, recurrent (HCC)   GAD (generalized anxiety disorder) Not taking any medications at home       Hypokalemia As needed repletion.  Check magnesium  and phosphorus     Tobacco abuse Tobacco cessation advised. Nicotine  replacement therapy ordered.     DVT prophylaxis: enoxaparin  (LOVENOX ) injection 40 mg Start: 10/10/23 2200    Code Status: Full Code Family Communication:   Continue hospital stay until able to tolerate p.o.    Subjective:  Unable to tolerate oral this morning.  At first she was telling me she wanted to be discharged but thereafter agreeable to stay.  Examination:  General exam: Appears calm and comfortable  Respiratory system: Clear to auscultation. Respiratory effort normal. Cardiovascular system: S1 & S2 heard, RRR. No JVD, murmurs, rubs, gallops or clicks. No pedal edema. Gastrointestinal system: Abdomen is nondistended, soft and nontender. No organomegaly or masses felt. Normal bowel sounds heard. Central nervous system: Alert and oriented. No focal  neurological deficits. Extremities: Symmetric 5 x 5 power. Skin: No rashes, lesions or ulcers Psychiatry: Judgement and insight appear normal. Mood & affect appropriate.                Diet Orders (From admission, onward)     Start     Ordered   10/10/23 1644  Diet Heart Room service appropriate? Yes; Fluid consistency: Thin  Diet effective now       Question Answer Comment  Room service appropriate? Yes   Fluid consistency: Thin      10/10/23 1644            Objective: Vitals:   10/10/23 1815 10/10/23 2129 10/11/23 0108 10/11/23 0509  BP:  (!) 173/91 (!) 168/93 138/89  Pulse:  71 (!) 56 69  Resp:  18 18 16   Temp: 98.6 F (37 C) 98.6 F (37 C) 98.3 F (36.8 C) 98.3 F (36.8 C)  TempSrc: Oral Oral    SpO2:  97% 100% 98%  Weight:      Height:        Intake/Output Summary (Last 24 hours) at 10/11/2023 1219 Last data filed at 10/11/2023 0400 Gross per 24 hour  Intake 468.84 ml  Output --  Net 468.84 ml   Filed Weights   10/10/23 0513  Weight: 113.4 kg    Scheduled Meds:  enoxaparin  (LOVENOX ) injection  40 mg Subcutaneous Q24H   famotidine   20 mg Oral BID   metoCLOPramide  (REGLAN ) injection  10 mg Intravenous Q6H   phosphorus  500 mg Oral Once   potassium chloride   40 mEq Oral Once   Continuous Infusions:  potassium chloride  10  mEq (10/11/23 1211)    Nutritional status     Body mass index is 44.29 kg/m.  Data Reviewed:   CBC: Recent Labs  Lab 10/08/23 0011 10/09/23 0242 10/10/23 0519 10/11/23 0532  WBC 19.5* 17.8* 14.7* 13.5*  NEUTROABS  --  14.0* 10.0*  --   HGB 13.8 13.5 13.7 13.9  HCT 41.1 38.5 39.8 42.1  MCV 83.5 81.4 82.1 84.4  PLT 312 318 327 309   Basic Metabolic Panel: Recent Labs  Lab 10/08/23 0011 10/09/23 0242 10/10/23 0519 10/10/23 1353 10/11/23 0532  NA 135 135 134* 133* 134*  K 3.0* 3.2* 2.9* 3.1* 3.0*  CL 99 97* 97* 101 102  CO2 15* 21* 20* 22 20*  GLUCOSE 189* 126* 136* 129* 117*  BUN 8 9 14 11 7    CREATININE 0.86 0.74 0.98 0.66 0.78  CALCIUM 9.4 9.6 9.3 8.5* 9.1  MG  --  2.0 2.2  --  2.6*  PHOS  --   --   --   --  2.0*   GFR: Estimated Creatinine Clearance: 126.9 mL/min (by C-G formula based on SCr of 0.78 mg/dL). Liver Function Tests: Recent Labs  Lab 10/08/23 0011 10/09/23 0242 10/10/23 0519 10/11/23 0532  AST 20 32 33 25  ALT 15 23 31  33  ALKPHOS 98 90 84 69  BILITOT 0.8 1.2 1.2 1.4*  PROT 7.6 7.9 7.6 7.2  ALBUMIN 4.6 4.8 4.7 4.2   Recent Labs  Lab 10/08/23 0011 10/10/23 0519  LIPASE 13 19   No results for input(s): AMMONIA in the last 168 hours. Coagulation Profile: No results for input(s): INR, PROTIME in the last 168 hours. Cardiac Enzymes: No results for input(s): CKTOTAL, CKMB, CKMBINDEX, TROPONINI in the last 168 hours. BNP (last 3 results) No results for input(s): PROBNP in the last 8760 hours. HbA1C: No results for input(s): HGBA1C in the last 72 hours. CBG: No results for input(s): GLUCAP in the last 168 hours. Lipid Profile: No results for input(s): CHOL, HDL, LDLCALC, TRIG, CHOLHDL, LDLDIRECT in the last 72 hours. Thyroid  Function Tests: No results for input(s): TSH, T4TOTAL, FREET4, T3FREE, THYROIDAB in the last 72 hours. Anemia Panel: No results for input(s): VITAMINB12, FOLATE, FERRITIN, TIBC, IRON, RETICCTPCT in the last 72 hours. Sepsis Labs: No results for input(s): PROCALCITON, LATICACIDVEN in the last 168 hours.  No results found for this or any previous visit (from the past 240 hours).       Radiology Studies: No results found.         LOS: 0 days   Time spent= 35 mins    Burgess JAYSON Dare, MD [facility]  If 7PM-7AM, please contact night-coverage  10/11/2023, 12:19 PM

## 2023-10-11 NOTE — Hospital Course (Addendum)
 Brief Narrative:  28 y.o. female with medical history significant of anxiety, cannabinoid hyperemesis syndrome, depression, GERD, gestational diabetes, ovarian cysts, pelvic pain, tobacco use who presented to the emergency department for the third time in the last 3 days due to nausea and vomiting due to cannabis use.    Assessment & Plan:  Principal Problem:   Intractable nausea and vomiting Active Problems:   Depression, recurrent (HCC)   GAD (generalized anxiety disorder)   Gastroesophageal reflux disease   Hypokalemia   Tobacco abuse   Cannabinoid hyperemesis syndrome   Intractable nausea and vomiting   Cannabinoid hyperemesis syndrome  Will check UDS.  Suspect cannabinoid hyperemesis syndrome.  Reglan  every 6 hours, advance diet as tolerated, IV fluids     Depression, recurrent (HCC)   GAD (generalized anxiety disorder) Not taking any medications at home       Hypokalemia As needed repletion.  Check magnesium  and phosphorus     Tobacco abuse Tobacco cessation advised. Nicotine  replacement therapy ordered.     DVT prophylaxis: enoxaparin  (LOVENOX ) injection 40 mg Start: 10/10/23 2200    Code Status: Full Code Family Communication:   Continue hospital stay until able to tolerate p.o.    Subjective:  Unable to tolerate oral this morning.  At first she was telling me she wanted to be discharged but thereafter agreeable to stay.  Examination:  General exam: Appears calm and comfortable  Respiratory system: Clear to auscultation. Respiratory effort normal. Cardiovascular system: S1 & S2 heard, RRR. No JVD, murmurs, rubs, gallops or clicks. No pedal edema. Gastrointestinal system: Abdomen is nondistended, soft and nontender. No organomegaly or masses felt. Normal bowel sounds heard. Central nervous system: Alert and oriented. No focal neurological deficits. Extremities: Symmetric 5 x 5 power. Skin: No rashes, lesions or ulcers Psychiatry: Judgement and insight appear  normal. Mood & affect appropriate.

## 2023-10-11 NOTE — Progress Notes (Signed)
   10/11/23 0952  TOC Brief Assessment  Insurance and Status Reviewed  Patient has primary care physician Yes  Home environment has been reviewed home w/ family  Prior level of function: independent  Prior/Current Home Services No current home services  Social Drivers of Health Review SDOH reviewed no interventions necessary  Readmission risk has been reviewed Yes  Transition of care needs no transition of care needs at this time

## 2023-10-11 NOTE — Plan of Care (Signed)
  Problem: Education: Goal: Knowledge of General Education information will improve Description: Including pain rating scale, medication(s)/side effects and non-pharmacologic comfort measures Outcome: Progressing   Problem: Health Behavior/Discharge Planning: Goal: Ability to manage health-related needs will improve Outcome: Progressing   Problem: Clinical Measurements: Goal: Ability to maintain clinical measurements within normal limits will improve Outcome: Progressing Goal: Will remain free from infection Outcome: Progressing Goal: Diagnostic test results will improve Outcome: Progressing Goal: Respiratory complications will improve Outcome: Progressing   Problem: Activity: Goal: Risk for activity intolerance will decrease Outcome: Progressing   Problem: Elimination: Goal: Will not experience complications related to urinary retention Outcome: Progressing   Problem: Pain Managment: Goal: General experience of comfort will improve and/or be controlled Outcome: Progressing   Problem: Safety: Goal: Ability to remain free from injury will improve Outcome: Progressing   Problem: Skin Integrity: Goal: Risk for impaired skin integrity will decrease Outcome: Progressing

## 2023-10-12 DIAGNOSIS — R1110 Vomiting, unspecified: Secondary | ICD-10-CM | POA: Diagnosis not present

## 2023-10-12 DIAGNOSIS — R112 Nausea with vomiting, unspecified: Secondary | ICD-10-CM | POA: Diagnosis not present

## 2023-10-12 DIAGNOSIS — F1290 Cannabis use, unspecified, uncomplicated: Secondary | ICD-10-CM

## 2023-10-12 LAB — BASIC METABOLIC PANEL WITH GFR
Anion gap: 11 (ref 5–15)
BUN: 7 mg/dL (ref 6–20)
CO2: 22 mmol/L (ref 22–32)
Calcium: 9 mg/dL (ref 8.9–10.3)
Chloride: 102 mmol/L (ref 98–111)
Creatinine, Ser: 0.92 mg/dL (ref 0.44–1.00)
GFR, Estimated: >60 mL/min (ref >60)
Glucose, Bld: 111 mg/dL — ABNORMAL HIGH (ref 70–99)
Potassium: 3.3 mmol/L — ABNORMAL LOW (ref 3.5–5.1)
Sodium: 135 mmol/L (ref 135–145)

## 2023-10-12 LAB — CBC
HCT: 42.7 % (ref 36.0–46.0)
Hemoglobin: 14.3 g/dL (ref 12.0–15.0)
MCH: 28.1 pg (ref 26.0–34.0)
MCHC: 33.5 g/dL (ref 30.0–36.0)
MCV: 84.1 fL (ref 80.0–100.0)
Platelets: 289 10*3/uL (ref 150–400)
RBC: 5.08 MIL/uL (ref 3.87–5.11)
RDW: 13 % (ref 11.5–15.5)
WBC: 12.1 10*3/uL — ABNORMAL HIGH (ref 4.0–10.5)
nRBC: 0 % (ref 0.0–0.2)

## 2023-10-12 LAB — PHOSPHORUS: Phosphorus: 2.6 mg/dL (ref 2.5–4.6)

## 2023-10-12 LAB — MAGNESIUM: Magnesium: 2.3 mg/dL (ref 1.7–2.4)

## 2023-10-12 MED ORDER — METOCLOPRAMIDE HCL 10 MG PO TABS: 10.0000 mg | ORAL_TABLET | Freq: Four times a day (QID) | ORAL | 1 refills | Status: DC

## 2023-10-12 MED ORDER — PROCHLORPERAZINE MALEATE 5 MG PO TABS: 5.0000 mg | ORAL_TABLET | Freq: Four times a day (QID) | ORAL | 0 refills | Status: DC | PRN

## 2023-10-12 MED ORDER — POTASSIUM CHLORIDE CRYS ER 20 MEQ PO TBCR
40.0000 meq | EXTENDED_RELEASE_TABLET | Freq: Once | ORAL | Status: AC
  Administered 2023-10-12: 40 meq via ORAL
  Filled 2023-10-12: qty 2

## 2023-10-12 MED ORDER — PANTOPRAZOLE SODIUM 40 MG PO TBEC: 40.0000 mg | DELAYED_RELEASE_TABLET | Freq: Every day | ORAL | 1 refills | Status: DC

## 2023-10-12 NOTE — Discharge Summary (Signed)
 Physician Discharge Summary   Patient: Christine Trujillo MRN: [id number] DOB: 08-06-1995  Admit date:     10/10/2023  Discharge date: 10/12/23  Discharge Physician: Nydia Distance, MD    PCP: Frann Mabel Mt, DO   Recommendations at discharge:   Continue Reglan , Compazine  for intractable nausea and vomiting  Discharge Diagnoses:    Intractable nausea and vomiting   Cannabinoid hyperemesis syndrome   Depression, recurrent (HCC)   GAD (generalized anxiety disorder)   Gastroesophageal reflux disease   Hypokalemia   Tobacco abuse    Hospital Course: 28 y.o. female with medical history significant of anxiety, cannabinoid hyperemesis syndrome, depression, GERD, gestational diabetes, ovarian cysts, pelvic pain, tobacco use who presented to the emergency department for the third time in the last 3 days due to nausea and vomiting due to cannabis us     Intractable nausea and vomiting Cannabinoid hyperemesis syndrome  UDS positive for THC, suspect cannabinoid hyperemesis syndrome.  - Patient was placed on Reglan  every 6 hours, IV fluids, pain control and as needed antiemetics  - She is doing well, tolerating solid diet and wants to go home as it is her and her daughters birthday today.  Patient reports that her symptoms have been improved. -Counseled on THC cessation     Depression, recurrent (HCC)   GAD (generalized anxiety disorder) Not taking any medications at home       Hypokalemia K3.3, replaced p.o.     Tobacco abuse Tobacco cessation advised. Nicotine  replacement therapy ordered.      Pain control - [place]  Controlled Substance Reporting System database was reviewed. and patient was instructed, not to drive, operate heavy machinery, perform activities at heights, swimming or participation in water activities or provide baby-sitting services while on Pain, Sleep and Anxiety Medications; until their outpatient Physician has advised to do so again. Also  recommended to not to take more than prescribed Pain, Sleep and Anxiety Medications.  Consultants: None Procedures performed: None Disposition: Home Diet recommendation:  Discharge Diet Orders (From admission, onward)     Start     Ordered   10/12/23 0000  Diet - low sodium heart healthy        10/12/23 1041            DISCHARGE MEDICATION: Allergies as of 10/12/2023   No Known Allergies      Medication List     STOP taking these medications    meloxicam  15 MG tablet Commonly known as: MOBIC    potassium chloride  10 MEQ tablet Commonly known as: Klor-Con  10       TAKE these medications    capsaicin  0.025 % cream Commonly known as: ZOSTRIX Apply over abdomen twice daily as needed for nausea.   DULoxetine  20 MG capsule Commonly known as: Cymbalta  Take 1 capsule (20 mg total) by mouth daily.   famotidine  20 MG tablet Commonly known as: PEPCID  Take 1 tablet (20 mg total) by mouth 2 (two) times daily.   levocetirizine 5 MG tablet Commonly known as: XYZAL  Take 1 tablet (5 mg total) by mouth every evening.   loperamide  2 MG tablet Commonly known as: IMODIUM  A-D Take 1 tablet (2 mg total) by mouth 4 (four) times daily as needed for diarrhea or loose stools.   metoCLOPramide  10 MG tablet Commonly known as: REGLAN  Take 1 tablet (10 mg total) by mouth every 6 (six) hours.   multivitamin with minerals tablet Take 1 tablet by mouth daily.   NICODERM CQ  TD Place 1 patch onto  the skin daily as needed (for smoking cessation).   nicotine  7 mg/24hr patch Commonly known as: NICODERM CQ  - dosed in mg/24 hr Place 1 patch (7 mg total) onto the skin daily. Notes to patient: Duplicate medication   norgestimate -ethinyl estradiol  0.25-35 MG-MCG tablet Commonly known as: Sprintec 28 Take 1 tab daily for 7 days in event of breakthrough bleeding.   ondansetron  4 MG disintegrating tablet Commonly known as: ZOFRAN -ODT Take 1 tablet (4 mg total) by mouth every 8 (eight)  hours as needed for nausea or vomiting. What changed: reasons to take this   pantoprazole  40 MG tablet Commonly known as: PROTONIX  Take 1 tablet (40 mg total) by mouth daily.   prochlorperazine  5 MG tablet Commonly known as: COMPAZINE  Take 1 tablet (5 mg total) by mouth every 6 (six) hours as needed for nausea or vomiting.   promethazine  25 MG suppository Commonly known as: PHENERGAN  Place 1 suppository (25 mg total) rectally every 6 (six) hours as needed for nausea or vomiting.   triamcinolone  cream 0.1 % Commonly known as: KENALOG  Apply 1 Application topically 2 (two) times daily.   Varenicline  Tartrate (Starter) 0.5 MG X 11 & 1 MG X 42 Tbpk Commonly known as: Chantix  Starting Month Pak Follow instructions on package.        Follow-up Information     Frann Mabel Mt, DO. Schedule an appointment as soon as possible for a visit in 2 week(s).   Specialty: Family Medicine Why: for hospital follow-up Contact information: [address] [place] [address] [phone]                Discharge Exam: Christine Trujillo   10/10/23 0513  Weight: 113.4 kg   S: Feeling a lot better today, tolerating solid diet.  No active nausea vomiting or abdominal pain.  Wants to go home.  BP (!) 146/90 (BP Location: Left Arm)   Pulse 65   Temp 98.1 F (36.7 C)   Resp 16   Ht 5' 3 (1.6 m)   Wt 113.4 kg   SpO2 99%   BMI 44.29 kg/m   Physical Exam General: Alert and oriented x 3, NAD Cardiovascular: S1 S2 clear, RRR.  Respiratory: CTAB, no wheezing, rales or rhonchi Gastrointestinal: Soft, nontender, nondistended, NBS Ext: no pedal edema bilaterally Neuro: no new deficits Skin: No rashes Psych: Normal affect    Condition at discharge: fair  The results of significant diagnostics from this hospitalization (including imaging, microbiology, ancillary and laboratory) are listed below for reference.   Imaging Studies: No results  found.  Microbiology: Results for orders placed or performed during the hospital encounter of 12/22/22  Resp panel by RT-PCR (RSV, Flu A&B, Covid) Anterior Nasal Swab     Status: None   Collection Time: 12/22/22 10:59 AM   Specimen: Anterior Nasal Swab  Result Value Ref Range Status   SARS Coronavirus 2 by RT PCR NEGATIVE NEGATIVE Final    Comment: (NOTE) SARS-CoV-2 target nucleic acids are NOT DETECTED.  The SARS-CoV-2 RNA is generally detectable in upper respiratory specimens during the acute phase of infection. The lowest concentration of SARS-CoV-2 viral copies this assay can detect is 138 copies/mL. A negative result does not preclude SARS-Cov-2 infection and should not be used as the sole basis for treatment or other patient management decisions. A negative result may occur with  improper specimen collection/handling, submission of specimen other than nasopharyngeal swab, presence of viral mutation(s) within the areas targeted by this assay,  and inadequate number of viral copies(<138 copies/mL). A negative result must be combined with clinical observations, patient history, and epidemiological information. The expected result is Negative.  Fact Sheet for Patients:  BloggerCourse.com  Fact Sheet for Healthcare Providers:  SeriousBroker.it  This test is no t yet approved or cleared by the [place]  FDA and  has been authorized for detection and/or diagnosis of SARS-CoV-2 by FDA under an Emergency Use Authorization (EUA). This EUA will remain  in effect (meaning this test can be used) for the duration of the COVID-19 declaration under Section 564(b)(1) of the Act, 21 U.S.C.section 360bbb-3(b)(1), unless the authorization is terminated  or revoked sooner.       Influenza A by PCR NEGATIVE NEGATIVE Final   Influenza B by PCR NEGATIVE NEGATIVE Final    Comment: (NOTE) The Xpert Xpress SARS-CoV-2/FLU/RSV plus assay is  intended as an aid in the diagnosis of influenza from Nasopharyngeal swab specimens and should not be used as a sole basis for treatment. Nasal washings and aspirates are unacceptable for Xpert Xpress SARS-CoV-2/FLU/RSV testing.  Fact Sheet for Patients: BloggerCourse.com  Fact Sheet for Healthcare Providers: SeriousBroker.it  This test is not yet approved or cleared by the [place]  FDA and has been authorized for detection and/or diagnosis of SARS-CoV-2 by FDA under an Emergency Use Authorization (EUA). This EUA will remain in effect (meaning this test can be used) for the duration of the COVID-19 declaration under Section 564(b)(1) of the Act, 21 U.S.C. section 360bbb-3(b)(1), unless the authorization is terminated or revoked.     Resp Syncytial Virus by PCR NEGATIVE NEGATIVE Final    Comment: (NOTE) Fact Sheet for Patients: BloggerCourse.com  Fact Sheet for Healthcare Providers: SeriousBroker.it  This test is not yet approved or cleared by the [place]  FDA and has been authorized for detection and/or diagnosis of SARS-CoV-2 by FDA under an Emergency Use Authorization (EUA). This EUA will remain in effect (meaning this test can be used) for the duration of the COVID-19 declaration under Section 564(b)(1) of the Act, 21 U.S.C. section 360bbb-3(b)(1), unless the authorization is terminated or revoked.  Performed at [facility], [address] [place], [place] [address]     Labs: CBC: Recent Labs  Lab 10/08/23 0011 10/09/23 0242 10/10/23 0519 10/11/23 0532 10/12/23 0542  WBC 19.5* 17.8* 14.7* 13.5* 12.1*  NEUTROABS  --  14.0* 10.0*  --   --   HGB 13.8 13.5 13.7 13.9 14.3  HCT 41.1 38.5 39.8 42.1 42.7  MCV 83.5 81.4 82.1 84.4 84.1  PLT 312 318 327 309 289   Basic Metabolic Panel: Recent Labs  Lab 10/09/23 0242 10/10/23 0519 10/10/23 1353  10/11/23 0532 10/12/23 0542  NA 135 134* 133* 134* 135  K 3.2* 2.9* 3.1* 3.0* 3.3*  CL 97* 97* 101 102 102  CO2 21* 20* 22 20* 22  GLUCOSE 126* 136* 129* 117* 111*  BUN 9 14 11 7 7   CREATININE 0.74 0.98 0.66 0.78 0.92  CALCIUM 9.6 9.3 8.5* 9.1 9.0  MG 2.0 2.2  --  2.6* 2.3  PHOS  --   --   --  2.0* 2.6   Liver Function Tests: Recent Labs  Lab 10/08/23 0011 10/09/23 0242 10/10/23 0519 10/11/23 0532  AST 20 32 33 25  ALT 15 23 31  33  ALKPHOS 98 90 84 69  BILITOT 0.8 1.2 1.2 1.4*  PROT 7.6 7.9 7.6 7.2  ALBUMIN 4.6 4.8 4.7 4.2   CBG: No results  for input(s): GLUCAP in the last 168 hours.  Discharge time spent: greater than 30 minutes.  Signed: Nydia Distance, MD [facility] 10/12/2023

## 2023-10-12 NOTE — Plan of Care (Signed)

## 2023-10-13 ENCOUNTER — Telehealth: Payer: Self-pay

## 2023-10-13 NOTE — Transitions of Care (Post Inpatient/ED Visit) (Signed)
   10/13/2023  Name: Christine Trujillo MRN: [id number] DOB: [birth year]  Today's TOC FU Call Status: Today's TOC FU Call Status:: Unsuccessful Call (1st Attempt) Unsuccessful Call (1st Attempt) Date: 10/13/23  Attempted to reach the patient regarding the most recent Inpatient/ED visit.  Follow Up Plan: Additional outreach attempts will be made to reach the patient to complete the Transitions of Care (Post Inpatient/ED visit) call.   Signature Julian Lemmings, LPN [facility] Nurse Health Advisor Direct Dial [phone]

## 2023-10-17 NOTE — Transitions of Care (Post Inpatient/ED Visit) (Signed)
   10/17/2023  Name: Christine Trujillo MRN: [id number] DOB: [birth year]  Today's TOC FU Call Status: Today's TOC FU Call Status:: Successful TOC FU Call Completed Unsuccessful Call (1st Attempt) Date: 10/13/23 [facility] FU Call Complete Date: 10/17/23 Patient's Name and Date of Birth confirmed.  Transition Care Management Follow-up Telephone Call Date of Discharge: 10/12/23 Discharge Facility: [facility]) Type of Discharge: Inpatient Admission Primary Inpatient Discharge Diagnosis:: nausea and vomiting How have you been since you were released from the hospital?: Better Any questions or concerns?: No  Items Reviewed: Did you receive and understand the discharge instructions provided?: Yes Medications obtained,verified, and reconciled?: Yes (Medications Reviewed) Any new allergies since your discharge?: No Dietary orders reviewed?: Yes Do you have support at home?: Yes People in Home [RPT]: spouse  Medications Reviewed Today: Medications Reviewed Today   Medications were not reviewed in this encounter     Home Care and Equipment/Supplies: Were Home Health Services Ordered?: NA Any new equipment or medical supplies ordered?: NA  Functional Questionnaire: Do you need assistance with bathing/showering or dressing?: No Do you need assistance with meal preparation?: No Do you need assistance with eating?: No Do you have difficulty maintaining continence: No Do you need assistance with getting out of bed/getting out of a chair/moving?: No Do you have difficulty managing or taking your medications?: No  Follow up appointments reviewed: PCP Follow-up appointment confirmed?: Yes Date of PCP follow-up appointment?: 10/18/23 Follow-up Provider: [facility] Follow-up appointment confirmed?: NA Do you need transportation to your follow-up appointment?: No Do you understand care options if your condition(s) worsen?: Yes-patient verbalized understanding    SIGNATURE  Julian Lemmings, LPN [facility] Nurse Health Advisor Direct Dial [phone]

## 2023-10-18 ENCOUNTER — Inpatient Hospital Stay: Payer: MEDICAID | Admitting: Family Medicine

## 2023-10-20 ENCOUNTER — Other Ambulatory Visit (HOSPITAL_BASED_OUTPATIENT_CLINIC_OR_DEPARTMENT_OTHER): Payer: Self-pay

## 2024-01-03 ENCOUNTER — Emergency Department (HOSPITAL_BASED_OUTPATIENT_CLINIC_OR_DEPARTMENT_OTHER)
Admission: EM | Admit: 2024-01-03 | Discharge: 2024-01-03 | Discharge status: Against Medical Advice | Payer: MEDICAID | Source: Home / Self Care | Attending: Emergency Medicine | Admitting: Emergency Medicine

## 2024-01-03 ENCOUNTER — Encounter (HOSPITAL_BASED_OUTPATIENT_CLINIC_OR_DEPARTMENT_OTHER): Payer: Self-pay

## 2024-01-03 ENCOUNTER — Inpatient Hospital Stay (HOSPITAL_BASED_OUTPATIENT_CLINIC_OR_DEPARTMENT_OTHER)
Admission: EM | Admit: 2024-01-03 | Discharge: 2024-01-06 | DRG: 392 | Disposition: A | Discharge status: Home / Self Care | Payer: MEDICAID | Attending: Internal Medicine | Admitting: Internal Medicine

## 2024-01-03 ENCOUNTER — Encounter (HOSPITAL_BASED_OUTPATIENT_CLINIC_OR_DEPARTMENT_OTHER): Payer: Self-pay | Admitting: Urology

## 2024-01-03 ENCOUNTER — Other Ambulatory Visit: Payer: Self-pay

## 2024-01-03 DIAGNOSIS — E66813 Obesity, class 3: Secondary | ICD-10-CM | POA: Diagnosis present

## 2024-01-03 DIAGNOSIS — R1110 Vomiting, unspecified: Principal | ICD-10-CM

## 2024-01-03 DIAGNOSIS — R197 Diarrhea, unspecified: Secondary | ICD-10-CM | POA: Insufficient documentation

## 2024-01-03 DIAGNOSIS — F32A Depression, unspecified: Secondary | ICD-10-CM | POA: Diagnosis present

## 2024-01-03 DIAGNOSIS — Z6841 Body Mass Index (BMI) 40.0 and over, adult: Secondary | ICD-10-CM

## 2024-01-03 DIAGNOSIS — Z79899 Other long term (current) drug therapy: Secondary | ICD-10-CM | POA: Insufficient documentation

## 2024-01-03 DIAGNOSIS — D72829 Elevated white blood cell count, unspecified: Secondary | ICD-10-CM | POA: Diagnosis present

## 2024-01-03 DIAGNOSIS — F1210 Cannabis abuse, uncomplicated: Secondary | ICD-10-CM | POA: Diagnosis present

## 2024-01-03 DIAGNOSIS — Z716 Tobacco abuse counseling: Secondary | ICD-10-CM

## 2024-01-03 DIAGNOSIS — E872 Acidosis, unspecified: Secondary | ICD-10-CM | POA: Diagnosis present

## 2024-01-03 DIAGNOSIS — R1084 Generalized abdominal pain: Secondary | ICD-10-CM | POA: Insufficient documentation

## 2024-01-03 DIAGNOSIS — F419 Anxiety disorder, unspecified: Secondary | ICD-10-CM | POA: Diagnosis present

## 2024-01-03 DIAGNOSIS — E871 Hypo-osmolality and hyponatremia: Secondary | ICD-10-CM | POA: Diagnosis present

## 2024-01-03 DIAGNOSIS — Z5329 Procedure and treatment not carried out because of patient's decision for other reasons: Secondary | ICD-10-CM | POA: Diagnosis present

## 2024-01-03 DIAGNOSIS — K219 Gastro-esophageal reflux disease without esophagitis: Secondary | ICD-10-CM | POA: Diagnosis present

## 2024-01-03 DIAGNOSIS — F17210 Nicotine dependence, cigarettes, uncomplicated: Secondary | ICD-10-CM | POA: Diagnosis present

## 2024-01-03 DIAGNOSIS — R059 Cough, unspecified: Secondary | ICD-10-CM | POA: Insufficient documentation

## 2024-01-03 DIAGNOSIS — Z720 Tobacco use: Secondary | ICD-10-CM | POA: Diagnosis present

## 2024-01-03 DIAGNOSIS — E860 Dehydration: Secondary | ICD-10-CM | POA: Diagnosis present

## 2024-01-03 DIAGNOSIS — Z7151 Drug abuse counseling and surveillance of drug abuser: Secondary | ICD-10-CM

## 2024-01-03 DIAGNOSIS — Z8249 Family history of ischemic heart disease and other diseases of the circulatory system: Secondary | ICD-10-CM

## 2024-01-03 DIAGNOSIS — R112 Nausea with vomiting, unspecified: Principal | ICD-10-CM | POA: Diagnosis present

## 2024-01-03 DIAGNOSIS — F12188 Cannabis abuse with other cannabis-induced disorder: Secondary | ICD-10-CM | POA: Diagnosis not present

## 2024-01-03 DIAGNOSIS — R1115 Cyclical vomiting syndrome unrelated to migraine: Secondary | ICD-10-CM | POA: Diagnosis present

## 2024-01-03 DIAGNOSIS — Z9049 Acquired absence of other specified parts of digestive tract: Secondary | ICD-10-CM

## 2024-01-03 DIAGNOSIS — I10 Essential (primary) hypertension: Secondary | ICD-10-CM | POA: Diagnosis present

## 2024-01-03 DIAGNOSIS — Z1152 Encounter for screening for COVID-19: Secondary | ICD-10-CM

## 2024-01-03 DIAGNOSIS — Z800 Family history of malignant neoplasm of digestive organs: Secondary | ICD-10-CM

## 2024-01-03 DIAGNOSIS — Z833 Family history of diabetes mellitus: Secondary | ICD-10-CM

## 2024-01-03 DIAGNOSIS — Z8632 Personal history of gestational diabetes: Secondary | ICD-10-CM

## 2024-01-03 DIAGNOSIS — F1290 Cannabis use, unspecified, uncomplicated: Secondary | ICD-10-CM | POA: Diagnosis present

## 2024-01-03 LAB — COMPREHENSIVE METABOLIC PANEL WITH GFR
ALT: 9 U/L (ref 0–44)
AST: 12 U/L — ABNORMAL LOW (ref 15–41)
Albumin: 4.4 g/dL (ref 3.5–5.0)
Alkaline Phosphatase: 84 U/L (ref 38–126)
Anion gap: 19 — ABNORMAL HIGH (ref 5–15)
BUN: 8 mg/dL (ref 6–20)
CO2: 15 mmol/L — ABNORMAL LOW (ref 22–32)
Calcium: 9.5 mg/dL (ref 8.9–10.3)
Chloride: 105 mmol/L (ref 98–111)
Creatinine, Ser: 0.78 mg/dL (ref 0.44–1.00)
GFR, Estimated: >60 mL/min (ref >60)
Glucose, Bld: 139 mg/dL — ABNORMAL HIGH (ref 70–99)
Potassium: 3.7 mmol/L (ref 3.5–5.1)
Sodium: 138 mmol/L (ref 135–145)
Total Bilirubin: 0.6 mg/dL (ref 0.0–1.2)
Total Protein: 7.8 g/dL (ref 6.5–8.1)

## 2024-01-03 LAB — CBC WITH DIFFERENTIAL/PLATELET
Abs Immature Granulocytes: 0.05 K/uL (ref 0.00–0.07)
Basophils Absolute: 0 K/uL (ref 0.0–0.1)
Basophils Relative: 0 %
Eosinophils Absolute: 0 K/uL (ref 0.0–0.5)
Eosinophils Relative: 0 %
HCT: 41.3 % (ref 36.0–46.0)
Hemoglobin: 14 g/dL (ref 12.0–15.0)
Immature Granulocytes: 0 %
Lymphocytes Relative: 21 %
Lymphs Abs: 3.2 K/uL (ref 0.7–4.0)
MCH: 28.2 pg (ref 26.0–34.0)
MCHC: 33.9 g/dL (ref 30.0–36.0)
MCV: 83.1 fL (ref 80.0–100.0)
Monocytes Absolute: 0.9 K/uL (ref 0.1–1.0)
Monocytes Relative: 6 %
Neutro Abs: 10.8 K/uL — ABNORMAL HIGH (ref 1.7–7.7)
Neutrophils Relative %: 73 %
Platelets: 375 K/uL (ref 150–400)
RBC: 4.97 MIL/uL (ref 3.87–5.11)
RDW: 13.3 % (ref 11.5–15.5)
WBC: 15 K/uL — ABNORMAL HIGH (ref 4.0–10.5)
nRBC: 0 % (ref 0.0–0.2)

## 2024-01-03 LAB — MAGNESIUM: Magnesium: 1.8 mg/dL (ref 1.7–2.4)

## 2024-01-03 LAB — RESP PANEL BY RT-PCR (RSV, FLU A&B, COVID)  RVPGX2
Influenza A by PCR: NEGATIVE
Influenza B by PCR: NEGATIVE
Resp Syncytial Virus by PCR: NEGATIVE
SARS Coronavirus 2 by RT PCR: NEGATIVE

## 2024-01-03 LAB — HCG, SERUM, QUALITATIVE: Preg, Serum: NEGATIVE

## 2024-01-03 LAB — PHOSPHORUS: Phosphorus: 3 mg/dL (ref 2.5–4.6)

## 2024-01-03 LAB — LIPASE, BLOOD: Lipase: 16 U/L (ref 11–51)

## 2024-01-03 MED ORDER — SODIUM CHLORIDE 0.9 % IV BOLUS
1000.0000 mL | Freq: Once | INTRAVENOUS | Status: AC
  Administered 2024-01-03: 1000 mL via INTRAVENOUS

## 2024-01-03 MED ORDER — LACTATED RINGERS IV BOLUS
1000.0000 mL | Freq: Once | INTRAVENOUS | Status: AC
  Administered 2024-01-03: 1000 mL via INTRAVENOUS

## 2024-01-03 MED ORDER — LORAZEPAM 2 MG/ML IJ SOLN
1.0000 mg | INTRAMUSCULAR | Status: DC | PRN
  Administered 2024-01-04: 1 mg via INTRAVENOUS
  Filled 2024-01-03: qty 1

## 2024-01-03 MED ORDER — ONDANSETRON HCL 4 MG/2ML IJ SOLN
INTRAMUSCULAR | Status: AC
  Administered 2024-01-03: 4 mg
  Filled 2024-01-03: qty 2

## 2024-01-03 MED ORDER — HALOPERIDOL LACTATE 5 MG/ML IJ SOLN: 5.0000 mg | Freq: Once | INTRAMUSCULAR | Status: DC

## 2024-01-03 MED ORDER — FAMOTIDINE IN NACL 20-0.9 MG/50ML-% IV SOLN
20.0000 mg | Freq: Once | INTRAVENOUS | Status: AC
  Administered 2024-01-03: 20 mg via INTRAVENOUS
  Filled 2024-01-03: qty 50

## 2024-01-03 MED ORDER — HALOPERIDOL LACTATE 5 MG/ML IJ SOLN
5.0000 mg | Freq: Once | INTRAMUSCULAR | Status: AC
  Administered 2024-01-03: 5 mg via INTRAVENOUS
  Filled 2024-01-03: qty 1

## 2024-01-03 MED ORDER — HALOPERIDOL LACTATE 5 MG/ML IJ SOLN
5.0000 mg | Freq: Once | INTRAMUSCULAR | Status: AC
  Administered 2024-01-03: 5 mg via INTRAMUSCULAR
  Filled 2024-01-03: qty 1

## 2024-01-03 MED ORDER — ONDANSETRON HCL 4 MG/2ML IJ SOLN
4.0000 mg | Freq: Once | INTRAMUSCULAR | Status: DC | PRN
  Filled 2024-01-03: qty 2

## 2024-01-03 MED ORDER — LORAZEPAM 2 MG/ML IJ SOLN: 1.0000 mg | INTRAMUSCULAR | Status: DC | PRN

## 2024-01-03 NOTE — Progress Notes (Signed)
 Plan of Care Note for accepted transfer   Patient: Christine Trujillo MRN: [id number]   DOA: 01/03/2024  Facility requesting transfer: [facility]   Requesting Provider: Signe Arlington, PA   Reason for transfer: intractable N/V   Facility course: 28 yr old female with depression, anxiety, HTN, BMI 48, and chronic cannabis use who presents with one day of N/V/D.   She is afebrile and hypertensive in ED. WBC is 15. Serum bicarb 15 and AG 19. Renal function normal. LFTs and lipase normal. Pregnancy test negative.   She was treated with IVF, Haldol , and Pepcid  in the ED.   Plan of care: The patient is accepted for admission to Telemetry unit, at [facility].   Author: Evalene GORMAN Sprinkles, MD 01/03/2024  Check [email or web address] for on-call coverage.  Nursing staff, Please call [facility] Admits & Consults System-Wide number on Amion as soon as patient's arrival, so appropriate admitting provider can evaluate the pt.

## 2024-01-03 NOTE — ED Provider Notes (Signed)
 [facility] Provider Note   CSN: [id number] Arrival date & time: 01/03/24  1038     Patient presents with: Emesis   Christine Trujillo is a 28 y.o. female with history of cannabis hyperemesis, GERD presents with complaints of intractable nausea and vomiting that started this morning.  Reports that she last smoked marijuana 2 days ago.  She does endorse a cough and nasal congestion as well.  Has associated generalized abdominal pain.  Reports that she has had some diarrhea.  No urinary or vaginal symptoms.  Has a history of cholecystectomy and tubal ligation.  Had recent admission for intractable nausea and vomiting in the setting of cannabis hyperemesis  7/25.  Patient is requesting Haldol  and Compazine .    Emesis     Past Medical History:  Diagnosis Date   Anxiety    Cannabinoid hyperemesis syndrome    Depression    GERD (gastroesophageal reflux disease)    History of anemia    during pregnancy   History of gestational diabetes    Ovarian cyst    left   Pelvic pain    Upper respiratory symptom    symptoms started 02-16-2021 cough/ runny nose;  pcp visit 03-10-2021 with sore throat/ congestion/ sob/ body aches/ cough but no fever, negative covid/ flu/ strep resultsin epic  (03-13-2021  pt stated today only has nonproductive cough)   Past Surgical History:  Procedure Laterality Date   BIOPSY  10/22/2020   Procedure: BIOPSY;  Surgeon: San Sandor GAILS, DO;  Location: [facility];  Service: Gastroenterology;;   CHOLECYSTECTOMY N/A 12/02/2016   Procedure: LAPAROSCOPIC CHOLECYSTECTOMY WITH INTRAOPERATIVE CHOLANGIOGRAM;  Surgeon: Ethyl Lenis, MD;  Location: [facility];  Service: General;  Laterality: N/A;   COLONOSCOPY WITH PROPOFOL  N/A 10/22/2020   Procedure: COLONOSCOPY WITH PROPOFOL ;  Surgeon: San Sandor GAILS, DO;  Location: [facility];  Service: Gastroenterology;  Laterality: N/A;   ESOPHAGOGASTRODUODENOSCOPY (EGD) WITH  PROPOFOL  N/A 10/22/2020   Procedure: ESOPHAGOGASTRODUODENOSCOPY (EGD) WITH PROPOFOL ;  Surgeon: San Sandor GAILS, DO;  Location: [facility];  Service: Gastroenterology;  Laterality: N/A;   ESOPHAGOGASTRODUODENOSCOPY (EGD) WITH PROPOFOL  N/A 10/22/2020   Procedure: ESOPHAGOGASTRODUODENOSCOPY (EGD) WITH PROPOFOL ;  Surgeon: San Sandor GAILS, DO;  Location: [facility];  Service: Gastroenterology;  Laterality: N/A;   LAPAROSCOPIC OVARIAN CYSTECTOMY Left 03/17/2021   Procedure: LAPAROSCOPIC Resection of Adnexal Mass;  Surgeon: Corene Coy, MD;  Location: [facility];  Service: Gynecology;  Laterality: Left;   LAPAROSCOPIC SALPINGO OOPHERECTOMY Bilateral 03/17/2021   Procedure: LAPAROSCOPIC BIALTERAL SALPINGECTOMY;  Surgeon: Corene Coy, MD;  Location: [facility];  Service: Gynecology;  Laterality: Bilateral;   TUBAL LIGATION     pt unsure of affected side (procedure on 03/17/21)   WISDOM TOOTH EXTRACTION       Prior to Admission medications   Medication Sig Start Date End Date Taking? Authorizing Provider  capsaicin  (ZOSTRIX) 0.025 % cream Apply over abdomen twice daily as needed for nausea. 06/27/23   Frann Mabel Mt, DO  DULoxetine  (CYMBALTA ) 20 MG capsule Take 1 capsule (20 mg total) by mouth daily. 01/31/23   Frann Mabel Mt, DO  famotidine  (PEPCID ) 20 MG tablet Take 1 tablet (20 mg total) by mouth 2 (two) times daily. 07/11/23   Frann Mabel Mt, DO  levocetirizine (XYZAL ) 5 MG tablet Take 1 tablet (5 mg total) by mouth every evening. Patient not taking: Reported on 10/17/2023 07/04/23   Frann Mabel Mt, DO  loperamide  (IMODIUM  A-D) 2 MG tablet  Take 1 tablet (2 mg total) by mouth 4 (four) times daily as needed for diarrhea or loose stools. Patient not taking: Reported on 10/17/2023 06/17/23   Roemhildt, Lorin T, PA-C  metoCLOPramide  (REGLAN ) 10 MG tablet Take 1 tablet (10 mg total) by mouth every 6 (six) hours.  10/12/23   Rai, Nydia POUR, MD  Multiple Vitamins-Minerals (MULTIVITAMIN WITH MINERALS) tablet Take 1 tablet by mouth daily.    [provider]  nicotine  (NICODERM CQ  - DOSED IN MG/24 HR) 7 mg/24hr patch Place 1 patch (7 mg total) onto the skin daily. Patient not taking: Reported on 10/17/2023 03/28/23   Frann Mabel Mt, DO  Nicotine  (NICODERM CQ  TD) Place 1 patch onto the skin daily as needed (for smoking cessation).    [provider]  norgestimate -ethinyl estradiol  (SPRINTEC 28) 0.25-35 MG-MCG tablet Take 1 tab daily for 7 days in event of breakthrough bleeding. Patient not taking: Reported on 10/17/2023 09/23/23   Frann Mabel Mt, DO  ondansetron  (ZOFRAN -ODT) 4 MG disintegrating tablet Take 1 tablet (4 mg total) by mouth every 8 (eight) hours as needed for nausea or vomiting. Patient taking differently: Take 4 mg by mouth every 8 (eight) hours as needed for nausea or vomiting (dissolve orally). 10/08/23   Roselyn Carlin NOVAK, MD  pantoprazole  (PROTONIX ) 40 MG tablet Take 1 tablet (40 mg total) by mouth daily. 10/12/23   Rai, Nydia POUR, MD  prochlorperazine  (COMPAZINE ) 5 MG tablet Take 1 tablet (5 mg total) by mouth every 6 (six) hours as needed for nausea or vomiting. 10/12/23   Rai, Nydia POUR, MD  promethazine  (PHENERGAN ) 25 MG suppository Place 1 suppository (25 mg total) rectally every 6 (six) hours as needed for nausea or vomiting. 06/23/23   Kehrli, Kelsey F, PA-C  triamcinolone  cream (KENALOG ) 0.1 % Apply 1 Application topically 2 (two) times daily. Patient not taking: Reported on 10/17/2023 07/10/23   Frann Mabel Mt, DO  Varenicline  Tartrate, Starter, (CHANTIX  STARTING MONTH PAK) 0.5 MG X 11 & 1 MG X 42 TBPK Follow instructions on package. Patient not taking: Reported on 10/17/2023 07/11/23   Frann Mabel Mt, DO    Allergies: Patient has no known allergies.    Review of Systems  Gastrointestinal:  Positive for vomiting.    Updated Vital Signs BP  (!) 179/102 (BP Location: Left Arm)   Pulse 80   Temp 97.7 F (36.5 C) (Oral)   Resp (!) 23   Wt 122.5 kg   SpO2 100%   BMI 47.83 kg/m   Physical Exam Vitals and nursing note reviewed.  Constitutional:      General: She is not in acute distress.    Appearance: She is well-developed.     Comments: Frequent emesis during exam  HENT:     Head: Normocephalic and atraumatic.  Eyes:     Conjunctiva/sclera: Conjunctivae normal.  Cardiovascular:     Rate and Rhythm: Normal rate and regular rhythm.     Heart sounds: No murmur heard. Pulmonary:     Effort: Pulmonary effort is normal. No respiratory distress.     Breath sounds: Normal breath sounds.  Abdominal:     Palpations: Abdomen is soft.     Tenderness: There is abdominal tenderness.     Comments: Generalized mild tenderness, soft nondistended, no focal tenderness to McBurney's point  Musculoskeletal:        General: No swelling.     Cervical back: Neck supple.  Skin:    General: Skin is warm and dry.  Capillary Refill: Capillary refill takes less than 2 seconds.  Neurological:     Mental Status: She is alert.  Psychiatric:        Mood and Affect: Mood normal.     (all labs ordered are listed, but only abnormal results are displayed) Labs Reviewed  CBC WITH DIFFERENTIAL/PLATELET - Abnormal; Notable for the following components:      Result Value   WBC 15.0 (*)    Neutro Abs 10.8 (*)    All other components within normal limits  COMPREHENSIVE METABOLIC PANEL WITH GFR - Abnormal; Notable for the following components:   CO2 15 (*)    Glucose, Bld 139 (*)    AST 12 (*)    Anion gap 19 (*)    All other components within normal limits  RESP PANEL BY RT-PCR (RSV, FLU A&B, COVID)  RVPGX2  LIPASE, BLOOD  URINALYSIS, ROUTINE W REFLEX MICROSCOPIC  HCG, SERUM, QUALITATIVE  PHOSPHORUS  MAGNESIUM     EKG: EKG Interpretation Date/Time:  Tuesday January 03 2024 11:26:22 EDT Ventricular Rate:  60 PR  Interval:  134 QRS Duration:  94 QT Interval:  465 QTC Calculation: 465 R Axis:   70  Text Interpretation: Sinus rhythm no acute ST/T changes overall similar to Jun 2025 Confirmed by Freddi Hamilton [phone]) on 01/03/2024 11:45:13 AM  Radiology: No results found.   Procedures   Medications Ordered in the ED  ondansetron  (ZOFRAN ) 4 MG/2ML injection (4 mg  Given 01/03/24 1121)  haloperidol  lactate (HALDOL ) injection 5 mg (5 mg Intravenous Given 01/03/24 1208)  sodium chloride  0.9 % bolus 1,000 mL (1,000 mLs Intravenous New Bag/Given 01/03/24 1212)    Clinical Course as of 01/03/24 1259  Tue Jan 03, 2024  1107 Patient with history of cannabis hyperemesis syndrome evaluated for complaints of persistent nausea and vomiting that started this morning.  He has additionally been having some URI symptoms as well.  She is hypertensive.  Otherwise hemodynamically stable.  She has persistent emesis during exam and has mild generalized abdominal tenderness.  Will obtain routine labs, check QTC on EKG and administer antiemetics. [JT]  1128 CBC with Differential(!) Leukocytosis of 15, hemoglobin stable [JT]  1203 Resp panel by RT-PCR (RSV, Flu A&B, Covid) Anterior Nasal Swab Negative [JT]  1204 Lipase, blood Within normal limit [JT]  1204 Comprehensive metabolic panel(!) Anion gap of 19, bicarb of 15, glucose 139 [JT]  1213 Sinus rhythm, QTc 465 [JT]  1255 Patient reports that she has to leave to take care of her daughter.  She is still not tolerating p.o.  She says she will be coming back later.  She will be leaving AMA.  She is understanding of risks of doing so. [JT]    Clinical Course User Index [JT] Donnajean Lynwood DEL, PA-C                                 Medical Decision Making  This patient presents to the ED with chief complaint(s) of nausea vomiting.  The complaint involves an extensive differential diagnosis and also carries with it a high risk of complications and morbidity.    Pertinent past medical history as listed in HPI  The differential diagnosis includes  Considered acute intra-abdominal pathology such as appendicitis, however patient has no focal tenderness to McBurney's point and is afebrile.  Additional history obtained: Records reviewed Care Everywhere/External Records  Disposition:   Patient left AMA  Social Determinants  of Health:   none  This note was dictated with voice recognition software.  Despite best efforts at proofreading, errors may have occurred which can change the documentation meaning.       Final diagnoses:  Cannabis hyperemesis syndrome concurrent with and due to cannabis abuse    ED Discharge Orders     None          Dail Meece H, PA-C 01/03/24 1259    Freddi Hamilton, MD 01/11/24 [phone]

## 2024-01-03 NOTE — ED Notes (Signed)
 Gave pt. PO fluids unable to tolerate

## 2024-01-03 NOTE — ED Triage Notes (Signed)
 Pt started vomiting this morning. Yellow/green mucous. Diarrhea. Last smoked marijuana 2 days ago

## 2024-01-03 NOTE — ED Provider Notes (Signed)
 [facility] Provider Note   CSN: [id number] Arrival date & time: 01/03/24  1803     Patient presents with: Emesis   Christine Trujillo is a 28 y.o. female patient with history of cannabis hyperemesis, GERD who presents to the emergency department today for further evaluation of intractable nausea and vomiting started this morning.  Patient states that she smokes approximately 8 blunts per day.  She states that she has been hospitalized in the past for cannabis hyperemesis.  Most recent was 7/25.  She states that this feels similar to when she was admitted last time.  Patient was seen evaluated here in the emergency department early this morning but had to leave to go get her daughter.  Nothing has changed about her condition since she left. She did leave AMA. She returns now for again intractable nausea and vomiting.  Denies any fever or chills.  Does endorse some diarrhea.    Emesis      Prior to Admission medications   Medication Sig Start Date End Date Taking? Authorizing Provider  capsaicin  (ZOSTRIX) 0.025 % cream Apply over abdomen twice daily as needed for nausea. 06/27/23   Frann Mabel Mt, DO  DULoxetine  (CYMBALTA ) 20 MG capsule Take 1 capsule (20 mg total) by mouth daily. 01/31/23   Frann Mabel Mt, DO  famotidine  (PEPCID ) 20 MG tablet Take 1 tablet (20 mg total) by mouth 2 (two) times daily. 07/11/23   Frann Mabel Mt, DO  levocetirizine (XYZAL ) 5 MG tablet Take 1 tablet (5 mg total) by mouth every evening. Patient not taking: Reported on 10/17/2023 07/04/23   Frann Mabel Mt, DO  loperamide  (IMODIUM  A-D) 2 MG tablet Take 1 tablet (2 mg total) by mouth 4 (four) times daily as needed for diarrhea or loose stools. Patient not taking: Reported on 10/17/2023 06/17/23   Roemhildt, Lorin T, PA-C  metoCLOPramide  (REGLAN ) 10 MG tablet Take 1 tablet (10 mg total) by mouth every 6 (six) hours. 10/12/23   Rai, Nydia POUR,  MD  Multiple Vitamins-Minerals (MULTIVITAMIN WITH MINERALS) tablet Take 1 tablet by mouth daily.    [provider]  nicotine  (NICODERM CQ  - DOSED IN MG/24 HR) 7 mg/24hr patch Place 1 patch (7 mg total) onto the skin daily. Patient not taking: Reported on 10/17/2023 03/28/23   Frann Mabel Mt, DO  Nicotine  (NICODERM CQ  TD) Place 1 patch onto the skin daily as needed (for smoking cessation).    [provider]  norgestimate -ethinyl estradiol  (SPRINTEC 28) 0.25-35 MG-MCG tablet Take 1 tab daily for 7 days in event of breakthrough bleeding. Patient not taking: Reported on 10/17/2023 09/23/23   Frann Mabel Mt, DO  ondansetron  (ZOFRAN -ODT) 4 MG disintegrating tablet Take 1 tablet (4 mg total) by mouth every 8 (eight) hours as needed for nausea or vomiting. Patient taking differently: Take 4 mg by mouth every 8 (eight) hours as needed for nausea or vomiting (dissolve orally). 10/08/23   Roselyn Carlin NOVAK, MD  pantoprazole  (PROTONIX ) 40 MG tablet Take 1 tablet (40 mg total) by mouth daily. 10/12/23   Rai, Ripudeep POUR, MD  prochlorperazine  (COMPAZINE ) 5 MG tablet Take 1 tablet (5 mg total) by mouth every 6 (six) hours as needed for nausea or vomiting. 10/12/23   Rai, Nydia POUR, MD  promethazine  (PHENERGAN ) 25 MG suppository Place 1 suppository (25 mg total) rectally every 6 (six) hours as needed for nausea or vomiting. 06/23/23   Kehrli, Kelsey F, PA-C  triamcinolone  cream (KENALOG ) 0.1 %  Apply 1 Application topically 2 (two) times daily. Patient not taking: Reported on 10/17/2023 07/10/23   Frann Mabel Mt, DO  Varenicline  Tartrate, Starter, (CHANTIX  STARTING MONTH PAK) 0.5 MG X 11 & 1 MG X 42 TBPK Follow instructions on package. Patient not taking: Reported on 10/17/2023 07/11/23   Frann Mabel Mt, DO    Allergies: Patient has no known allergies.    Review of Systems  Gastrointestinal:  Positive for vomiting.  All other systems reviewed and are  negative.   Updated Vital Signs BP (!) 186/124   Pulse 81   Temp 97.9 F (36.6 C) (Oral)   Resp 20   Ht 5' 3 (1.6 m)   Wt 122.4 kg   SpO2 100%   BMI 47.80 kg/m   Physical Exam Vitals and nursing note reviewed.  Constitutional:      General: She is not in acute distress.    Appearance: Normal appearance.  HENT:     Head: Normocephalic and atraumatic.  Eyes:     General:        Right eye: No discharge.        Left eye: No discharge.  Cardiovascular:     Comments: Regular rate and rhythm.  S1/S2 are distinct without any evidence of murmur, rubs, or gallops.  Radial pulses are 2+ bilaterally.  Dorsalis pedis pulses are 2+ bilaterally.  No evidence of pedal edema. Pulmonary:     Comments: Clear to auscultation bilaterally.  Normal effort.  No respiratory distress.  No evidence of wheezes, rales, or rhonchi heard throughout. Abdominal:     General: Abdomen is flat. Bowel sounds are normal. There is no distension.     Tenderness: There is generalized abdominal tenderness. There is no guarding or rebound.  Musculoskeletal:        General: Normal range of motion.     Cervical back: Neck supple.  Skin:    General: Skin is warm and dry.     Findings: No rash.  Neurological:     General: No focal deficit present.     Mental Status: She is alert.  Psychiatric:        Mood and Affect: Mood normal.        Behavior: Behavior normal.     (all labs ordered are listed, but only abnormal results are displayed) Labs Reviewed - No data to display  EKG: EKG Interpretation Date/Time:  Tuesday January 03 2024 18:35:44 EDT Ventricular Rate:  57 PR Interval:  135 QRS Duration:  101 QT Interval:  476 QTC Calculation: 464 R Axis:   61  Text Interpretation: Sinus rhythm Since last tracing rate slower Otherwise no significant change Confirmed by Emil Share [phone]) on 01/03/2024 6:53:18 PM  Radiology: No results found.   Procedures   Medications Ordered in the ED  sodium  chloride 0.9 % bolus 1,000 mL (has no administration in time range)  famotidine  (PEPCID ) IVPB 20 mg premix (has no administration in time range)  haloperidol  lactate (HALDOL ) injection 5 mg (5 mg Intramuscular Given 01/03/24 1908)    Clinical Course as of 01/03/24 2024  Tue Jan 03, 2024  2003 Patient did not pass p.o. challenge. [CF]    Clinical Course User Index [CF] Theotis Cameron HERO, PA-C    Medical Decision Making Risk Prescription drug management. Decision regarding hospitalization.   Christine Trujillo Satcher is a 28 y.o. female patient who presents to the emergency department today for further evaluation of intractable nausea and vomiting.  Patient was just seen  evaluated here earlier this morning.  On chart review, no evidence of electrolyte derangements.  Lipase was normal.  Pregnancy negative.  Respiratory panel negative.  Evidence of leukocytosis but is likely reactive from vomiting.  Do not feel that imaging is warranted.  Most recent CT scan was back in March for similar symptoms.  This is likely related to cannabis use.  Will give her some Haldol  if we retain EKG and try to p.o. challenge her after some time.  If she fails she would likely be admitted for intractable nausea and vomiting secondary to cannabis hyperemesis syndrome.  Patient failed fluid challenge.  Still having some vomiting and diarrhea.  I do feel the patient would likely benefit from further evaluation inside the hospital.  I will work on getting admitted to the hospital service.  I spoke with Dr. Charlton with [facility] hospitalist who agrees to admit the patient.     Final diagnoses:  Intractable vomiting    ED Discharge Orders     None          Theotis Cameron HERO, [place] 01/03/24 2024    Emil Share, DO 01/03/24 2108

## 2024-01-03 NOTE — ED Triage Notes (Signed)
 Pt seen earlier today and left AMA  States continued vomiting since leaving to go get daughter  States pain in stomach and booty hole due to diarrhea

## 2024-01-03 NOTE — ED Notes (Signed)
 Patient stated she was leaving & needed to be discharged rapidly. Provider at bedside, provider explained risks of leaving without proper treatment. pt verbalized understanding said  I will be back later, I have to go take care of my child. Patient pulled IV out. This nurse applied dressing. Pt. Left AMA

## 2024-01-03 NOTE — ED Notes (Addendum)
 Unable to obtain EKG due to patient severe vomiting, will try again shortly

## 2024-01-04 DIAGNOSIS — Z720 Tobacco use: Secondary | ICD-10-CM

## 2024-01-04 DIAGNOSIS — F1290 Cannabis use, unspecified, uncomplicated: Secondary | ICD-10-CM | POA: Diagnosis not present

## 2024-01-04 DIAGNOSIS — E669 Obesity, unspecified: Secondary | ICD-10-CM

## 2024-01-04 DIAGNOSIS — R112 Nausea with vomiting, unspecified: Secondary | ICD-10-CM | POA: Diagnosis not present

## 2024-01-04 LAB — CBC
HCT: 41.8 % (ref 36.0–46.0)
Hemoglobin: 13.3 g/dL (ref 12.0–15.0)
MCH: 27.2 pg (ref 26.0–34.0)
MCHC: 31.8 g/dL (ref 30.0–36.0)
MCV: 85.5 fL (ref 80.0–100.0)
Platelets: 350 K/uL (ref 150–400)
RBC: 4.89 MIL/uL (ref 3.87–5.11)
RDW: 13.2 % (ref 11.5–15.5)
WBC: 16.9 K/uL — ABNORMAL HIGH (ref 4.0–10.5)
nRBC: 0 % (ref 0.0–0.2)

## 2024-01-04 LAB — BASIC METABOLIC PANEL WITH GFR
Anion gap: 16 — ABNORMAL HIGH (ref 5–15)
BUN: 8 mg/dL (ref 6–20)
CO2: 20 mmol/L — ABNORMAL LOW (ref 22–32)
Calcium: 9.5 mg/dL (ref 8.9–10.3)
Chloride: 97 mmol/L — ABNORMAL LOW (ref 98–111)
Creatinine, Ser: 0.78 mg/dL (ref 0.44–1.00)
GFR, Estimated: >60 mL/min (ref >60)
Glucose, Bld: 134 mg/dL — ABNORMAL HIGH (ref 70–99)
Potassium: 3.3 mmol/L — ABNORMAL LOW (ref 3.5–5.1)
Sodium: 132 mmol/L — ABNORMAL LOW (ref 135–145)

## 2024-01-04 LAB — MAGNESIUM: Magnesium: 2 mg/dL (ref 1.7–2.4)

## 2024-01-04 MED ORDER — CAPSAICIN 0.025 % EX CREA
TOPICAL_CREAM | Freq: Two times a day (BID) | CUTANEOUS | Status: DC
  Filled 2024-01-04: qty 60

## 2024-01-04 MED ORDER — ENOXAPARIN SODIUM 60 MG/0.6ML IJ SOSY
60.0000 mg | PREFILLED_SYRINGE | Freq: Every day | INTRAMUSCULAR | Status: DC
  Administered 2024-01-04 – 2024-01-05 (×2): 60 mg via SUBCUTANEOUS
  Filled 2024-01-04 (×2): qty 0.6

## 2024-01-04 MED ORDER — POTASSIUM CHLORIDE 2 MEQ/ML IV SOLN
Freq: Once | INTRAVENOUS | Status: AC
  Filled 2024-01-04: qty 1000

## 2024-01-04 MED ORDER — PANTOPRAZOLE SODIUM 40 MG PO TBEC
40.0000 mg | DELAYED_RELEASE_TABLET | Freq: Every day | ORAL | Status: DC
  Administered 2024-01-04 – 2024-01-06 (×2): 40 mg via ORAL
  Filled 2024-01-04 (×3): qty 1

## 2024-01-04 MED ORDER — FAMOTIDINE IN NACL 20-0.9 MG/50ML-% IV SOLN
20.0000 mg | Freq: Two times a day (BID) | INTRAVENOUS | Status: DC
  Administered 2024-01-04 – 2024-01-06 (×5): 20 mg via INTRAVENOUS
  Filled 2024-01-04 (×5): qty 50

## 2024-01-04 MED ORDER — HYDRALAZINE HCL 20 MG/ML IJ SOLN: 5.0000 mg | INTRAMUSCULAR | Status: DC | PRN

## 2024-01-04 MED ORDER — ALBUTEROL SULFATE (2.5 MG/3ML) 0.083% IN NEBU: 2.5000 mg | INHALATION_SOLUTION | RESPIRATORY_TRACT | Status: DC | PRN

## 2024-01-04 MED ORDER — HALOPERIDOL LACTATE 5 MG/ML IJ SOLN
1.0000 mg | Freq: Four times a day (QID) | INTRAMUSCULAR | Status: DC | PRN
  Administered 2024-01-04 – 2024-01-05 (×4): 1 mg via INTRAVENOUS
  Filled 2024-01-04 (×4): qty 1

## 2024-01-04 MED ORDER — PROCHLORPERAZINE EDISYLATE 10 MG/2ML IJ SOLN: 10.0000 mg | Freq: Four times a day (QID) | INTRAMUSCULAR | Status: DC | PRN

## 2024-01-04 MED ORDER — PROMETHAZINE HCL 25 MG RE SUPP
25.0000 mg | Freq: Four times a day (QID) | RECTAL | Status: DC | PRN
  Administered 2024-01-05 (×2): 25 mg via RECTAL
  Filled 2024-01-04 (×3): qty 1

## 2024-01-04 MED ORDER — ACETAMINOPHEN 325 MG PO TABS: 650.0000 mg | ORAL_TABLET | Freq: Four times a day (QID) | ORAL | Status: DC | PRN

## 2024-01-04 MED ORDER — LORAZEPAM 2 MG/ML IJ SOLN
1.0000 mg | INTRAMUSCULAR | Status: DC | PRN
Start: 2024-01-04 — End: 2024-01-04

## 2024-01-04 MED ORDER — NICOTINE 7 MG/24HR TD PT24
7.0000 mg | MEDICATED_PATCH | Freq: Every day | TRANSDERMAL | Status: DC
  Administered 2024-01-04 – 2024-01-06 (×3): 7 mg via TRANSDERMAL
  Filled 2024-01-04 (×3): qty 1

## 2024-01-04 MED ORDER — ONDANSETRON HCL 4 MG/2ML IJ SOLN
4.0000 mg | Freq: Four times a day (QID) | INTRAMUSCULAR | Status: DC | PRN
  Administered 2024-01-04 – 2024-01-06 (×4): 4 mg via INTRAVENOUS
  Filled 2024-01-04 (×4): qty 2

## 2024-01-04 NOTE — Progress Notes (Addendum)
 Progress Note   Patient: Christine Trujillo FMW:[id number] DOB: Aug 03, 1995 DOA: 01/03/2024     0 DOS: the patient was seen and examined on 01/04/2024   Brief hospital course: 28yo with h/o anxiety, cannabinoid hyperemesis syndrome, and tobacco use who presented on 9/23 with intractable n/v. Given IVF, diet, analgesics/antiemetics. No documented n/v/d.  Assessment and Plan:  Cannabinoid hyperemesis syndrome  Patient with prior similar admission for this condition Likely related to cannabinoid hyperemesis syndrome, as she is continuing to use daily; absolute cessation is needed Overall, her labs and imaging appear benign at this time Anticipate d/c to home tomorrow Encourage marijuana cessation and a good bowel regimen as well as behavioral health support Full liquid diet Analgesics as needed - Tylenol  ordered; she understands that opiates will not be prescribed Antiemetics as needed - Haldol  and Zofran , Phenergan  suppository for breakthrough n/v Pepcid  20mg  IV bid Continue pantoprazole  Previously on duloxetine ; mood medication may be useful Mild dehydration on labs today (hyponatremia, metabolic acidosis, anion gap) - will give 500 cc IVF infusion   Tobacco abuse  Smoking cessation counseling Nicotine  patch ordered   Morbid/class 3 obesity Body mass index is 47.8 kg/m.SABRA  Weight loss should be encouraged Outpatient PCP/bariatric medicine f/u encouraged Significantly low or high BMI is associated with higher medical risk including morbidity and mortality          Consultants: None   Procedures: None   Antibiotics: None        Subjective: Reports feeling terrible, does not think she can go home today.   Objective: Vitals:   01/04/24 1030 01/04/24 1400  BP: (!) 177/96 139/74  Pulse: (!) 57 91  Resp: 16 18  Temp: 98.2 F (36.8 C)   SpO2: 100% 100%    Intake/Output Summary (Last 24 hours) at 01/04/2024 1558 Last data filed at 01/04/2024 1500 Gross per 24 hour   Intake 639.13 ml  Output --  Net 639.13 ml   Filed Weights   01/03/24 1817  Weight: 122.4 kg    Exam:  General:  Appears calm and comfortable and is in NAD Eyes:  normal lids, iris ENT:  grossly normal hearing, lips & tongue, mmm; appropriate dentition Cardiovascular:  RRR. No LE edema.  Respiratory:   CTA bilaterally with no wheezes/rales/rhonchi.  Normal respiratory effort. Abdomen:  soft, NT, ND Skin:  no rash or induration seen on limited exam Musculoskeletal:  grossly normal tone BUE/BLE, good ROM, no bony abnormality Psychiatric:  blunted mood and affect, speech fluent and appropriate, AOx3 Neurologic:  CN 2-12 grossly intact, moves all extremities in coordinated fashion  Data Reviewed: I have reviewed the patient's lab results since admission.  Pertinent labs for today include:   Na++ 132 K+ 3.3 CO2 20, improved Glucose 134 Anion gap 16 WBC 16.9     Family Communication: None present      Code Status: Full Code   Disposition: Status is: Observation The patient remains OBS appropriate and will d/c before 2 midnights.     Time spent: 50 minutes  Unresulted Labs (From admission, onward)     Start     Ordered   01/05/24 0500  Basic metabolic panel  Tomorrow morning,   R        01/04/24 1547   01/05/24 0500  CBC  Tomorrow morning,   R        01/04/24 1547             Author: Delon Herald, MD 01/04/2024 3:58 PM  For  on call review [email or web address].

## 2024-01-04 NOTE — Plan of Care (Signed)
  Problem: Education: Goal: Knowledge of General Education information will improve Description: Including pain rating scale, medication(s)/side effects and non-pharmacologic comfort measures Outcome: Progressing   Problem: Health Behavior/Discharge Planning: Goal: Ability to manage health-related needs will improve Outcome: Progressing   Problem: Clinical Measurements: Goal: Ability to maintain clinical measurements within normal limits will improve Outcome: Progressing Goal: Diagnostic test results will improve Outcome: Progressing   Problem: Activity: Goal: Risk for activity intolerance will decrease Outcome: Progressing   Problem: Coping: Goal: Level of anxiety will decrease Outcome: Progressing   Problem: Pain Managment: Goal: General experience of comfort will improve and/or be controlled Outcome: Progressing

## 2024-01-04 NOTE — Progress Notes (Signed)
   01/04/24 1133  TOC Brief Assessment  Insurance and Status Reviewed  Patient has primary care physician Yes  Home environment has been reviewed home w/ spouse  Prior level of function: independent  Prior/Current Home Services No current home services  Social Drivers of Health Review SDOH reviewed no interventions necessary  Readmission risk has been reviewed Yes  Transition of care needs no transition of care needs at this time

## 2024-01-04 NOTE — Plan of Care (Signed)
  Problem: Health Behavior/Discharge Planning: Goal: Ability to manage health-related needs will improve Outcome: Progressing   Problem: Clinical Measurements: Goal: Diagnostic test results will improve Outcome: Progressing   Problem: Activity: Goal: Risk for activity intolerance will decrease Outcome: Progressing   

## 2024-01-04 NOTE — Hospital Course (Signed)
 28yo with h/o anxiety, cannabinoid hyperemesis syndrome, and tobacco use who presented on 9/23 with intractable n/v. Given IVF, diet, analgesics/antiemetics. Persistent n/v overnight, will change to CLD and continue to monitor.

## 2024-01-04 NOTE — H&P (Signed)
 History and Physical    Patient: Christine Trujillo FMW:[id number] DOB: December 11, 1995 DOA: 01/03/2024 DOS: the patient was seen and examined on 01/04/2024 PCP: Christine Mabel Mt, DO  Patient coming from: Home  Chief Complaint:  Chief Complaint  Patient presents with   Emesis   HPI: Christine Trujillo is a 28 y.o. female with medical history significant for anxiety, cannabinoid hyperemesis syndrome, depression, GERD, gestational diabetes, ovarian cysts, pelvic pain, and tobacco use presents with intractable nausea vomiting and diarrhea.  This feels like the last time she was admitted with cannabis hyperemesis syndrome 2 months ago.  The patient says the vomiting started this morning she has vomited multiple times. She denies seeing any blood.  She was seen early in the morning in the emergency department but had to leave to go get her daughter.  She presented back this evening. Her symptoms were unchanged. The diarrhea seems to be as bad as the vomiting.  The patient's floor nurse reports that he hasn'Trujillo seen either vomiting or diarrhea but she reports it.    Review of Systems: As mentioned in the history of present illness. All other systems reviewed and are negative. Past Medical History:  Diagnosis Date   Anxiety    Cannabinoid hyperemesis syndrome    Depression    GERD (gastroesophageal reflux disease)    History of anemia    during pregnancy   History of gestational diabetes    Ovarian cyst    left   Pelvic pain    Upper respiratory symptom    symptoms started 02-16-2021 cough/ runny nose;  pcp visit 03-10-2021 with sore throat/ congestion/ sob/ body aches/ cough but no fever, negative covid/ flu/ strep resultsin epic  (03-13-2021  pt stated today only has nonproductive cough)   Past Surgical History:  Procedure Laterality Date   BIOPSY  10/22/2020   Procedure: BIOPSY;  Surgeon: Christine Sandor GAILS, DO;  Location: [facility];  Service: Gastroenterology;;   CHOLECYSTECTOMY N/A  12/02/2016   Procedure: LAPAROSCOPIC CHOLECYSTECTOMY WITH INTRAOPERATIVE CHOLANGIOGRAM;  Surgeon: Christine Lenis, MD;  Location: [facility];  Service: General;  Laterality: N/A;   COLONOSCOPY WITH PROPOFOL  N/A 10/22/2020   Procedure: COLONOSCOPY WITH PROPOFOL ;  Surgeon: Christine Sandor GAILS, DO;  Location: [facility];  Service: Gastroenterology;  Laterality: N/A;   ESOPHAGOGASTRODUODENOSCOPY (EGD) WITH PROPOFOL  N/A 10/22/2020   Procedure: ESOPHAGOGASTRODUODENOSCOPY (EGD) WITH PROPOFOL ;  Surgeon: Christine Sandor GAILS, DO;  Location: [facility];  Service: Gastroenterology;  Laterality: N/A;   ESOPHAGOGASTRODUODENOSCOPY (EGD) WITH PROPOFOL  N/A 10/22/2020   Procedure: ESOPHAGOGASTRODUODENOSCOPY (EGD) WITH PROPOFOL ;  Surgeon: Christine Sandor GAILS, DO;  Location: [facility];  Service: Gastroenterology;  Laterality: N/A;   LAPAROSCOPIC OVARIAN CYSTECTOMY Left 03/17/2021   Procedure: LAPAROSCOPIC Resection of Adnexal Mass;  Surgeon: Christine Coy, MD;  Location: [facility];  Service: Gynecology;  Laterality: Left;   LAPAROSCOPIC SALPINGO OOPHERECTOMY Bilateral 03/17/2021   Procedure: LAPAROSCOPIC BIALTERAL SALPINGECTOMY;  Surgeon: Christine Coy, MD;  Location: [facility];  Service: Gynecology;  Laterality: Bilateral;   TUBAL LIGATION     pt unsure of affected side (procedure on 03/17/21)   WISDOM TOOTH EXTRACTION     Social History:  reports that she has been smoking cigarettes. She has a 2.5 pack-year smoking history. She has never used smokeless tobacco. She reports current drug use. Frequency: 3.00 times per week. Drug: Marijuana. She reports that she does not drink alcohol.  No Known Allergies  Family History  Problem Relation Age of Onset   Diabetes Mother  Heart disease Mother        CHF   Stomach cancer Brother    Colon cancer Neg Hx    Pancreatic cancer Neg Hx    Esophageal cancer Neg Hx    Liver disease Neg Hx     Prior to Admission  medications   Medication Sig Start Date End Date Taking? Authorizing Provider  capsaicin  (ZOSTRIX) 0.025 % cream Apply over abdomen twice daily as needed for nausea. 06/27/23   Christine Mabel Mt, DO  DULoxetine  (CYMBALTA ) 20 MG capsule Take 1 capsule (20 mg total) by mouth daily. 01/31/23   Christine Mabel Mt, DO  famotidine  (PEPCID ) 20 MG tablet Take 1 tablet (20 mg total) by mouth 2 (two) times daily. 07/11/23   Christine Mabel Mt, DO  levocetirizine (XYZAL ) 5 MG tablet Take 1 tablet (5 mg total) by mouth every evening. Patient not taking: Reported on 10/17/2023 07/04/23   Christine Mabel Mt, DO  loperamide  (IMODIUM  A-D) 2 MG tablet Take 1 tablet (2 mg total) by mouth 4 (four) times daily as needed for diarrhea or loose stools. Patient not taking: Reported on 10/17/2023 06/17/23   Roemhildt, Christine T, PA-C  metoCLOPramide  (REGLAN ) 10 MG tablet Take 1 tablet (10 mg total) by mouth every 6 (six) hours. 10/12/23   Rai, Christine POUR, MD  Multiple Vitamins-Minerals (MULTIVITAMIN WITH MINERALS) tablet Take 1 tablet by mouth daily.    [provider]  nicotine  (NICODERM CQ  - DOSED IN MG/24 HR) 7 mg/24hr patch Place 1 patch (7 mg total) onto the skin daily. Patient not taking: Reported on 10/17/2023 03/28/23   Christine Mabel Mt, DO  Nicotine  (NICODERM CQ  TD) Place 1 patch onto the skin daily as needed (for smoking cessation).    [provider]  norgestimate -ethinyl estradiol  (SPRINTEC 28) 0.25-35 MG-MCG tablet Take 1 tab daily for 7 days in event of breakthrough bleeding. Patient not taking: Reported on 10/17/2023 09/23/23   Christine Mabel Mt, DO  ondansetron  (ZOFRAN -ODT) 4 MG disintegrating tablet Take 1 tablet (4 mg total) by mouth every 8 (eight) hours as needed for nausea or vomiting. Patient taking differently: Take 4 mg by mouth every 8 (eight) hours as needed for nausea or vomiting (dissolve orally). 10/08/23   Christine Carlin NOVAK, MD  pantoprazole  (PROTONIX ) 40 MG  tablet Take 1 tablet (40 mg total) by mouth daily. 10/12/23   Rai, Christine POUR, MD  prochlorperazine  (COMPAZINE ) 5 MG tablet Take 1 tablet (5 mg total) by mouth every 6 (six) hours as needed for nausea or vomiting. 10/12/23   Rai, Christine POUR, MD  promethazine  (PHENERGAN ) 25 MG suppository Place 1 suppository (25 mg total) rectally every 6 (six) hours as needed for nausea or vomiting. 06/23/23   Kehrli, Kelsey F, PA-C  triamcinolone  cream (KENALOG ) 0.1 % Apply 1 Application topically 2 (two) times daily. Patient not taking: Reported on 10/17/2023 07/10/23   Christine Mabel Mt, DO  Varenicline  Tartrate, Starter, (CHANTIX  STARTING MONTH PAK) 0.5 MG X 11 & 1 MG X 42 TBPK Follow instructions on package. Patient not taking: Reported on 10/17/2023 07/11/23   Christine Mabel Mt, DO    Physical Exam: Vitals:   01/03/24 2049 01/03/24 2049 01/03/24 2109 01/03/24 2232  BP: (!) 176/105   (!) 184/78  Pulse: 62   (!) 51  Resp: (!) 29   18  Temp:  97.7 F (36.5 C)  97.8 F (36.6 C)  TempSrc:      SpO2: 100%  100% 99%  Weight:  Height:       Physical Exam:  General: No acute distress, well developed, well nourished HEENT: Normocephalic, atraumatic, PERRL Cardiovascular: Normal rate and rhythm. Distal pulses intact. Pulmonary: Normal pulmonary effort, normal breath sounds Gastrointestinal: Nondistended abdomen, soft, non-tender, hypoactive bowel sounds Musculoskeletal:Normal ROM, no lower ext edema Lymphadenopathy: No cervical LAD. Skin: Skin is warm and dry. Neuro: No focal deficits noted, AAOx3. Nl gait PSYCH: Attentive and cooperative  Data Reviewed:  Results for orders placed or performed during the hospital encounter of 01/03/24 (from the past 24 hours)  CBC with Differential     Status: Abnormal   Collection Time: 01/03/24 11:22 AM  Result Value Ref Range   WBC 15.0 (H) 4.0 - 10.5 K/uL   RBC 4.97 3.87 - 5.11 MIL/uL   Hemoglobin 14.0 12.0 - 15.0 g/dL   HCT 58.6 63.9 - 53.9 %   MCV  83.1 80.0 - 100.0 fL   MCH 28.2 26.0 - 34.0 pg   MCHC 33.9 30.0 - 36.0 g/dL   RDW 86.6 88.4 - 84.4 %   Platelets 375 150 - 400 K/uL   nRBC 0.0 0.0 - 0.2 %   Neutrophils Relative % 73 %   Neutro Abs 10.8 (H) 1.7 - 7.7 K/uL   Lymphocytes Relative 21 %   Lymphs Abs 3.2 0.7 - 4.0 K/uL   Monocytes Relative 6 %   Monocytes Absolute 0.9 0.1 - 1.0 K/uL   Eosinophils Relative 0 %   Eosinophils Absolute 0.0 0.0 - 0.5 K/uL   Basophils Relative 0 %   Basophils Absolute 0.0 0.0 - 0.1 K/uL   Immature Granulocytes 0 %   Abs Immature Granulocytes 0.05 0.00 - 0.07 K/uL  Comprehensive metabolic panel     Status: Abnormal   Collection Time: 01/03/24 11:22 AM  Result Value Ref Range   Sodium 138 135 - 145 mmol/L   Potassium 3.7 3.5 - 5.1 mmol/L   Chloride 105 98 - 111 mmol/L   CO2 15 (L) 22 - 32 mmol/L   Glucose, Bld 139 (H) 70 - 99 mg/dL   BUN 8 6 - 20 mg/dL   Creatinine, Ser 9.21 0.44 - 1.00 mg/dL   Calcium 9.5 8.9 - 89.6 mg/dL   Total Protein 7.8 6.5 - 8.1 g/dL   Albumin 4.4 3.5 - 5.0 g/dL   AST 12 (L) 15 - 41 U/L   ALT 9 0 - 44 U/L   Alkaline Phosphatase 84 38 - 126 U/L   Total Bilirubin 0.6 0.0 - 1.2 mg/dL   GFR, Estimated >39 >39 mL/min   Anion gap 19 (H) 5 - 15  Lipase, blood     Status: None   Collection Time: 01/03/24 11:22 AM  Result Value Ref Range   Lipase 16 11 - 51 U/L  Resp panel by RT-PCR (RSV, Flu A&B, Covid) Anterior Nasal Swab     Status: None   Collection Time: 01/03/24 11:22 AM   Specimen: Anterior Nasal Swab  Result Value Ref Range   SARS Coronavirus 2 by RT PCR NEGATIVE NEGATIVE   Influenza A by PCR NEGATIVE NEGATIVE   Influenza B by PCR NEGATIVE NEGATIVE   Resp Syncytial Virus by PCR NEGATIVE NEGATIVE  hCG, serum, qualitative     Status: None   Collection Time: 01/03/24 12:38 PM  Result Value Ref Range   Preg, Serum NEGATIVE NEGATIVE  Phosphorus     Status: None   Collection Time: 01/03/24 12:38 PM  Result Value Ref Range   Phosphorus  3.0 2.5 - 4.6 mg/dL   Magnesium      Status: None   Collection Time: 01/03/24 12:38 PM  Result Value Ref Range   Magnesium  1.8 1.7 - 2.4 mg/dL     Assessment and Plan: Cannabis hyperemesis syndrome - Continue IV fluids. Full liquid diet. Advance as tolerated. Analgesics as needed. Antiemetics as needed. She requests Haldol  and compazine . Pepcid  20mg  IV bid Follow CBC, CMP in AM. The patient gets diarrhea whenever she has this. Monitor.  Would not give anything strong for the diarrhea as I do not want to make the nausea worse.  2.  Tobacco abuse -smoking cessation counseling needed.   Advance Care Planning:   Code Status: Prior  CODE STATUS discussion deferred.  She will be full code by default.  Consults: None  Family Communication: None  Severity of Illness: The appropriate patient status for this patient is OBSERVATION. Observation status is judged to be reasonable and necessary in order to provide the required intensity of service to ensure the patient's safety. The patient's presenting symptoms, physical exam findings, and initial radiographic and laboratory data in the context of their medical condition is felt to place them at decreased risk for further clinical deterioration. Furthermore, it is anticipated that the patient will be medically stable for discharge from the hospital within 2 midnights of admission.   Author: ARTHEA CHILD, MD 01/04/2024 12:53 AM  For on call review [email or web address].

## 2024-01-05 DIAGNOSIS — Z9049 Acquired absence of other specified parts of digestive tract: Secondary | ICD-10-CM | POA: Diagnosis not present

## 2024-01-05 DIAGNOSIS — Z1152 Encounter for screening for COVID-19: Secondary | ICD-10-CM | POA: Diagnosis not present

## 2024-01-05 DIAGNOSIS — Z6841 Body Mass Index (BMI) 40.0 and over, adult: Secondary | ICD-10-CM | POA: Diagnosis not present

## 2024-01-05 DIAGNOSIS — F1290 Cannabis use, unspecified, uncomplicated: Secondary | ICD-10-CM | POA: Diagnosis not present

## 2024-01-05 DIAGNOSIS — R1110 Vomiting, unspecified: Secondary | ICD-10-CM | POA: Diagnosis present

## 2024-01-05 DIAGNOSIS — E871 Hypo-osmolality and hyponatremia: Secondary | ICD-10-CM | POA: Diagnosis present

## 2024-01-05 DIAGNOSIS — D72829 Elevated white blood cell count, unspecified: Secondary | ICD-10-CM | POA: Diagnosis present

## 2024-01-05 DIAGNOSIS — F1210 Cannabis abuse, uncomplicated: Secondary | ICD-10-CM | POA: Diagnosis present

## 2024-01-05 DIAGNOSIS — I10 Essential (primary) hypertension: Secondary | ICD-10-CM | POA: Diagnosis present

## 2024-01-05 DIAGNOSIS — Z5329 Procedure and treatment not carried out because of patient's decision for other reasons: Secondary | ICD-10-CM | POA: Diagnosis present

## 2024-01-05 DIAGNOSIS — R112 Nausea with vomiting, unspecified: Secondary | ICD-10-CM | POA: Diagnosis present

## 2024-01-05 DIAGNOSIS — F32A Depression, unspecified: Secondary | ICD-10-CM | POA: Diagnosis present

## 2024-01-05 DIAGNOSIS — R197 Diarrhea, unspecified: Secondary | ICD-10-CM | POA: Diagnosis present

## 2024-01-05 DIAGNOSIS — Z7151 Drug abuse counseling and surveillance of drug abuser: Secondary | ICD-10-CM | POA: Diagnosis not present

## 2024-01-05 DIAGNOSIS — E66813 Obesity, class 3: Secondary | ICD-10-CM | POA: Diagnosis present

## 2024-01-05 DIAGNOSIS — Z8249 Family history of ischemic heart disease and other diseases of the circulatory system: Secondary | ICD-10-CM | POA: Diagnosis not present

## 2024-01-05 DIAGNOSIS — F17210 Nicotine dependence, cigarettes, uncomplicated: Secondary | ICD-10-CM | POA: Diagnosis present

## 2024-01-05 DIAGNOSIS — E872 Acidosis, unspecified: Secondary | ICD-10-CM | POA: Diagnosis present

## 2024-01-05 DIAGNOSIS — Z8632 Personal history of gestational diabetes: Secondary | ICD-10-CM | POA: Diagnosis not present

## 2024-01-05 DIAGNOSIS — Z833 Family history of diabetes mellitus: Secondary | ICD-10-CM | POA: Diagnosis not present

## 2024-01-05 DIAGNOSIS — Z800 Family history of malignant neoplasm of digestive organs: Secondary | ICD-10-CM | POA: Diagnosis not present

## 2024-01-05 DIAGNOSIS — F419 Anxiety disorder, unspecified: Secondary | ICD-10-CM | POA: Diagnosis present

## 2024-01-05 DIAGNOSIS — K219 Gastro-esophageal reflux disease without esophagitis: Secondary | ICD-10-CM | POA: Diagnosis present

## 2024-01-05 DIAGNOSIS — Z79899 Other long term (current) drug therapy: Secondary | ICD-10-CM | POA: Diagnosis not present

## 2024-01-05 DIAGNOSIS — R1115 Cyclical vomiting syndrome unrelated to migraine: Secondary | ICD-10-CM | POA: Diagnosis present

## 2024-01-05 DIAGNOSIS — E860 Dehydration: Secondary | ICD-10-CM | POA: Diagnosis present

## 2024-01-05 DIAGNOSIS — Z716 Tobacco abuse counseling: Secondary | ICD-10-CM | POA: Diagnosis not present

## 2024-01-05 LAB — BASIC METABOLIC PANEL WITH GFR
Anion gap: 14 (ref 5–15)
BUN: 10 mg/dL (ref 6–20)
CO2: 21 mmol/L — ABNORMAL LOW (ref 22–32)
Calcium: 9.2 mg/dL (ref 8.9–10.3)
Chloride: 99 mmol/L (ref 98–111)
Creatinine, Ser: 0.73 mg/dL (ref 0.44–1.00)
GFR, Estimated: >60 mL/min (ref >60)
Glucose, Bld: 109 mg/dL — ABNORMAL HIGH (ref 70–99)
Potassium: 3.5 mmol/L (ref 3.5–5.1)
Sodium: 134 mmol/L — ABNORMAL LOW (ref 135–145)

## 2024-01-05 LAB — CBC
HCT: 41.2 % (ref 36.0–46.0)
Hemoglobin: 13 g/dL (ref 12.0–15.0)
MCH: 26.9 pg (ref 26.0–34.0)
MCHC: 31.6 g/dL (ref 30.0–36.0)
MCV: 85.1 fL (ref 80.0–100.0)
Platelets: 321 K/uL (ref 150–400)
RBC: 4.84 MIL/uL (ref 3.87–5.11)
RDW: 13.1 % (ref 11.5–15.5)
WBC: 14.2 K/uL — ABNORMAL HIGH (ref 4.0–10.5)
nRBC: 0 % (ref 0.0–0.2)

## 2024-01-05 MED ORDER — PROCHLORPERAZINE EDISYLATE 10 MG/2ML IJ SOLN
10.0000 mg | Freq: Once | INTRAMUSCULAR | Status: AC
  Administered 2024-01-05: 10 mg via INTRAVENOUS
  Filled 2024-01-05: qty 2

## 2024-01-05 MED ORDER — METOCLOPRAMIDE HCL 5 MG/ML IJ SOLN
5.0000 mg | Freq: Four times a day (QID) | INTRAMUSCULAR | Status: DC
  Administered 2024-01-05 – 2024-01-06 (×5): 5 mg via INTRAVENOUS
  Filled 2024-01-05 (×5): qty 2

## 2024-01-05 MED ORDER — HYDROXYZINE HCL 25 MG PO TABS: 25.0000 mg | ORAL_TABLET | Freq: Three times a day (TID) | ORAL | Status: DC | PRN

## 2024-01-05 NOTE — Plan of Care (Signed)

## 2024-01-05 NOTE — TOC Initial Note (Signed)
 Transition of Care [facility]) - Initial/Assessment Note    Patient Details  Name: Christine Trujillo MRN: [id number] Date of Birth: 13-Nov-1995  Transition of Care [facility]) CM/SW Contact:    Sonda Manuella Quill, RN Phone Number: 01/05/2024, 4:36 PM  Clinical Narrative:                 [facility] consult for SA counseling/education; spoke w/ pt in room; pt said she lives at home w/ her husband Fairy Post [phone]) she plans to return at d/c; he will provide transportation; pt verified insurance/PCP; she denied SDOH risks; pt does not have DME, HH servces, or home oxygen; she agreed to receive resource for SA; resources placed in d/c instructions; copy of resources also given to pt; she will make her own appt w/ agency of choice; following.  Expected Discharge Plan: Home/Self Care Barriers to Discharge: Continued Medical Work up   Patient Goals and CMS Choice Patient states their goals for this hospitalization and ongoing recovery are:: home          Expected Discharge Plan and Services   Discharge Planning Services: CM Consult   Living arrangements for the past 2 months: Single Family Home                 DME Arranged: N/A DME Agency: NA       HH Arranged: NA HH Agency: NA        Prior Living Arrangements/Services Living arrangements for the past 2 months: Single Family Home Lives with:: Spouse Patient language and need for interpreter reviewed:: Yes Do you feel safe going back to the place where you live?: Yes      Need for Family Participation in Patient Care: Yes (Comment) Care giver support system in place?: Yes (comment) Current home services:  (n/a) Criminal Activity/Legal Involvement Pertinent to Current Situation/Hospitalization: No - Comment as needed  Activities of Daily Living   ADL Screening (condition at time of admission) Independently performs ADLs?: Yes (appropriate for developmental age) Is the patient deaf or have difficulty hearing?: No Does the  patient have difficulty seeing, even when wearing glasses/contacts?: No Does the patient have difficulty concentrating, remembering, or making decisions?: No  Permission Sought/Granted Permission sought to share information with : Case Manager Permission granted to share information with : Yes, Verbal Permission Granted  Share Information with NAME: Case Manager     Permission granted to share info w Relationship: Fairy Post (spouse) [phone]     Emotional Assessment Appearance:: Appears stated age Attitude/Demeanor/Rapport: Gracious Affect (typically observed): Accepting Orientation: : Oriented to Self, Oriented to Place, Oriented to  Time, Oriented to Situation Alcohol / Substance Use: Illicit Drugs Psych Involvement: No (comment)  Admission diagnosis:  Intractable vomiting [R11.10] Intractable nausea and vomiting [R11.2] Cyclical vomiting syndrome [R11.15] Patient Active Problem List   Diagnosis Date Noted   Cyclical vomiting syndrome 01/05/2024   Cannabinoid hyperemesis syndrome 10/10/2023   Tobacco abuse 06/27/2023   Intractable nausea and vomiting 01/28/2023   Hypokalemia 01/28/2023   Pelvic pain in female 03/17/2021   Request for sterilization 03/17/2021   Gastroesophageal reflux disease 03/04/2021   Pain of upper abdomen    Non-intractable vomiting    Family history- stomach cancer    Diarrhea    Change in bowel habits    Depression, major, single episode, mild 01/23/2020   Capsulitis of right shoulder 10/05/2019   GAD (generalized anxiety disorder) 04/20/2019   Dermatitis 05/10/2018   Closed fracture of tooth 05/10/2018   Chronic midline  low back pain without sciatica 07/21/2016   Obesity, Class III, BMI 40-49.9 (morbid obesity) 04/22/2016   Depression, recurrent 04/22/2016   Adnexal cyst 03/25/2016   PCP:  Frann Mabel Mt, DO Pharmacy:   [facility] [phone] - [place], [place] - [address] [place] [address] Phone: [phone] Fax: [phone]  [facility] [place] [facility] [address] [place] [address] Phone: [phone] Fax: [phone]     Social Drivers of Health (SDOH) Social History: SDOH Screenings   Food Insecurity: No Food Insecurity (01/05/2024)  Housing: Low Risk  (01/05/2024)  Transportation Needs: No Transportation Needs (01/05/2024)  Utilities: Not At Risk (01/05/2024)  Depression (PHQ2-9): Low Risk  (11/22/2022)  Financial Resource Strain: Low Risk  (01/31/2023)  Physical Activity: Unknown (01/31/2023)  Recent Concern: Physical Activity - Inactive (01/31/2023)  Social Connections: Moderately Isolated (01/31/2023)  Stress: Stress Concern Present (01/31/2023)  Tobacco Use: High Risk (01/03/2024)   SDOH Interventions: Food Insecurity Interventions: Intervention Not Indicated, Inpatient TOC Housing Interventions: Intervention Not Indicated, Inpatient TOC Transportation Interventions: Intervention Not Indicated, Inpatient TOC Utilities Interventions: Intervention Not Indicated, Inpatient TOC   Readmission Risk Interventions    01/05/2024    4:34 PM  Readmission Risk Prevention Plan  Transportation Screening Complete  PCP or Specialist Appt within 5-7 Days Complete  Home Care Screening Complete  Medication Review (RN CM) Complete

## 2024-01-05 NOTE — Progress Notes (Signed)
 Progress Note   Patient: Christine Trujillo FMW:[id number] DOB: Nov 02, 1995 DOA: 01/03/2024     0 DOS: the patient was seen and examined on 01/05/2024   Brief hospital course: 28yo with h/o anxiety, cannabinoid hyperemesis syndrome, and tobacco use who presented on 9/23 with intractable n/v. Given IVF, diet, analgesics/antiemetics. Persistent n/v overnight, will change to CLD and continue to monitor.  Assessment and Plan:  Cannabinoid hyperemesis syndrome  Patient with prior similar admission for this condition Likely related to cannabinoid hyperemesis syndrome, as she is continuing to use daily; absolute cessation is needed Overall, her labs and imaging appear benign at this time Holding dc because she is continuing to vomit Encourage marijuana cessation and a good bowel regimen as well as behavioral health support Full liquid diet changed to CLD and now will make NPO Analgesics as needed - Tylenol  ordered; she understands that opiates will not be prescribed Antiemetics as needed - Haldol  and Zofran , Phenergan  suppository for breakthrough n/v Pepcid  20mg  IV bid Added Reglan  5 mg IV q6h today Continue pantoprazole  Previously on duloxetine ; mood medication may be useful - will add prn PO hydroxyzine  Also added Zostrix cream  Leukocytosis Likely reactive in nature Will recheck in AM   Tobacco abuse  Smoking cessation counseling Nicotine  patch ordered   Morbid/class 3 obesity Body mass index is 47.8 kg/m.SABRA  Weight loss should be encouraged Outpatient PCP/bariatric medicine f/u encouraged Significantly low or high BMI is associated with higher medical risk including morbidity and mortality            Consultants: None   Procedures: None   Antibiotics: None         Subjective: Miserable with ongoing n/v.   Objective: Vitals:   01/05/24 0441 01/05/24 1202  BP: (!) 122/58 (!) 144/85  Pulse: (!) 58 69  Resp: 20 18  Temp: 98.3 F (36.8 C) 98.6 F (37 C)  SpO2:  99% 99%    Intake/Output Summary (Last 24 hours) at 01/05/2024 1346 Last data filed at 01/04/2024 1858 Gross per 24 hour  Intake 108.63 ml  Output --  Net 108.63 ml   Filed Weights   01/03/24 1817  Weight: 122.4 kg    Exam:  General:  Appears uncomfortable due to n/v Eyes:  normal lids, iris ENT:  grossly normal hearing, lips & tongue, mmm Cardiovascular:  RRR. No LE edema.  Respiratory:   CTA bilaterally with no wheezes/rales/rhonchi.  Normal respiratory effort. Abdomen:  soft, NT, ND Skin:  no rash or induration seen on limited exam Musculoskeletal:  grossly normal tone BUE/BLE, good ROM, no bony abnormality Psychiatric:  blunted mood and affect, speech fluent and appropriate, AOx3 Neurologic:  CN 2-12 grossly intact, moves all extremities in coordinated fashion  Data Reviewed: I have reviewed the patient's lab results since admission.  Pertinent labs for today include:   Na++ 134, not clinically significant CO2 21, improving Glucose 109 WBC 14.2, improved     Family Communication: None present      Code Status: Full Code  Barriers to discharge:  persistent n/v  Disposition: Status is: Inpatient Admit - It is my clinical opinion that admission to INPATIENT is reasonable and necessary because of the expectation that this patient will require hospital care that crosses at least 2 midnights to treat this condition based on the medical complexity of the problems presented.  Given the aforementioned information, the predictability of an adverse outcome is felt to be significant.      Time spent: 50 minutes  Unresulted Labs (From admission, onward)     Start     Ordered   Unscheduled  CBC with Differential/Platelet  Tomorrow morning,   R        01/05/24 1346   Unscheduled  Basic metabolic panel with GFR  Tomorrow morning,   R        01/05/24 1346             Author: Delon Herald, MD 01/05/2024 1:46 PM  For on call review [email or web address].

## 2024-01-06 ENCOUNTER — Other Ambulatory Visit (HOSPITAL_COMMUNITY): Payer: Self-pay

## 2024-01-06 ENCOUNTER — Other Ambulatory Visit (HOSPITAL_BASED_OUTPATIENT_CLINIC_OR_DEPARTMENT_OTHER): Payer: Self-pay

## 2024-01-06 DIAGNOSIS — R112 Nausea with vomiting, unspecified: Secondary | ICD-10-CM | POA: Diagnosis not present

## 2024-01-06 DIAGNOSIS — F1290 Cannabis use, unspecified, uncomplicated: Secondary | ICD-10-CM | POA: Diagnosis not present

## 2024-01-06 LAB — CBC WITH DIFFERENTIAL/PLATELET
Abs Immature Granulocytes: 0.06 K/uL (ref 0.00–0.07)
Basophils Absolute: 0 K/uL (ref 0.0–0.1)
Basophils Relative: 0 %
Eosinophils Absolute: 0 K/uL (ref 0.0–0.5)
Eosinophils Relative: 0 %
HCT: 41.8 % (ref 36.0–46.0)
Hemoglobin: 14 g/dL (ref 12.0–15.0)
Immature Granulocytes: 0 %
Lymphocytes Relative: 15 %
Lymphs Abs: 2.3 K/uL (ref 0.7–4.0)
MCH: 28.3 pg (ref 26.0–34.0)
MCHC: 33.5 g/dL (ref 30.0–36.0)
MCV: 84.6 fL (ref 80.0–100.0)
Monocytes Absolute: 1.6 K/uL — ABNORMAL HIGH (ref 0.1–1.0)
Monocytes Relative: 10 %
Neutro Abs: 11.2 K/uL — ABNORMAL HIGH (ref 1.7–7.7)
Neutrophils Relative %: 75 %
Platelets: 346 K/uL (ref 150–400)
RBC: 4.94 MIL/uL (ref 3.87–5.11)
RDW: 13 % (ref 11.5–15.5)
WBC: 15.1 K/uL — ABNORMAL HIGH (ref 4.0–10.5)
nRBC: 0 % (ref 0.0–0.2)

## 2024-01-06 LAB — BASIC METABOLIC PANEL WITH GFR
Anion gap: 15 (ref 5–15)
BUN: 10 mg/dL (ref 6–20)
CO2: 21 mmol/L — ABNORMAL LOW (ref 22–32)
Calcium: 9.5 mg/dL (ref 8.9–10.3)
Chloride: 100 mmol/L (ref 98–111)
Creatinine, Ser: 0.87 mg/dL (ref 0.44–1.00)
GFR, Estimated: >60 mL/min (ref >60)
Glucose, Bld: 100 mg/dL — ABNORMAL HIGH (ref 70–99)
Potassium: 3.2 mmol/L — ABNORMAL LOW (ref 3.5–5.1)
Sodium: 135 mmol/L (ref 135–145)

## 2024-01-06 MED ORDER — CAPSAICIN 0.025 % EX CREA
TOPICAL_CREAM | Freq: Two times a day (BID) | CUTANEOUS | 0 refills | Status: DC
  Filled 2024-01-06: qty 60, fill #0

## 2024-01-06 MED ORDER — POTASSIUM CHLORIDE CRYS ER 20 MEQ PO TBCR: 40.0000 meq | EXTENDED_RELEASE_TABLET | Freq: Once | ORAL | Status: DC

## 2024-01-06 MED ORDER — AMLODIPINE BESYLATE 5 MG PO TABS
5.0000 mg | ORAL_TABLET | Freq: Every day | ORAL | Status: DC
  Administered 2024-01-06: 5 mg via ORAL
  Filled 2024-01-06: qty 1

## 2024-01-06 MED ORDER — POTASSIUM CHLORIDE CRYS ER 20 MEQ PO TBCR
40.0000 meq | EXTENDED_RELEASE_TABLET | Freq: Once | ORAL | Status: AC
  Administered 2024-01-06: 40 meq via ORAL
  Filled 2024-01-06: qty 2

## 2024-01-06 MED ORDER — HYDROXYZINE HCL 25 MG PO TABS
25.0000 mg | ORAL_TABLET | Freq: Three times a day (TID) | ORAL | 0 refills | Status: DC | PRN
  Filled 2024-01-06: qty 30, 10d supply, fill #0

## 2024-01-06 MED ORDER — METOCLOPRAMIDE HCL 10 MG PO TABS
5.0000 mg | ORAL_TABLET | Freq: Three times a day (TID) | ORAL | 1 refills | Status: AC
  Filled 2024-01-06: qty 45, 30d supply, fill #0

## 2024-01-06 MED ORDER — PROMETHAZINE HCL 25 MG RE SUPP
25.0000 mg | Freq: Four times a day (QID) | RECTAL | 0 refills | Status: DC | PRN
  Filled 2024-01-06: qty 12, 3d supply, fill #0

## 2024-01-06 MED ORDER — AMLODIPINE BESYLATE 5 MG PO TABS
5.0000 mg | ORAL_TABLET | Freq: Every day | ORAL | 0 refills | Status: DC
  Filled 2024-01-06: qty 30, 30d supply, fill #0

## 2024-01-06 NOTE — Discharge Summary (Signed)
 Physician Discharge Summary   Patient: Christine Trujillo MRN: [id number] DOB: 11-Aug-1995  Admit date:     01/03/2024  Discharge date: 01/06/24  Discharge Physician: Delon Herald   PCP: Frann Mabel Mt, DO   Recommendations at discharge:   Do NOT use marijuana! Stop smoking - nicotine  patch provided Medications for nausea are provided Take amlodipine  (Norvasc ) for high blood pressure Follow up with Dr. Frann in 1-2 weeks  Discharge Diagnoses: Principal Problem:   Cannabinoid hyperemesis syndrome Active Problems:   Obesity, Class III, BMI 40-49.9 (morbid obesity)   Intractable nausea and vomiting   Tobacco abuse   Cyclical vomiting syndrome    Hospital Course: 28yo with h/o anxiety, cannabinoid hyperemesis syndrome, and tobacco use who presented on 9/23 with intractable n/v. Given IVF, diet, analgesics/antiemetics. N/V has resolved, she has advanced diet, and she is requesting discharge.  Assessment and Plan:  Cannabinoid hyperemesis syndrome  Patient with prior similar admission for this condition Likely related to cannabinoid hyperemesis syndrome, as she is continuing to use daily; absolute cessation is needed Overall, her labs and imaging appear benign at this time Encourage marijuana cessation and a good bowel regimen as well as behavioral health support Analgesics as needed - Tylenol  ordered; she understands that opiates will not be prescribed Antiemetics as needed - Zofran , Phenergan  suppository for breakthrough n/v Added Reglan  5 mg IV q6h, will continue PO reglan  Continue pantoprazole  and famotidine  Previously on duloxetine ; mood medication may be useful - continue prn PO hydroxyzine  for now Also added Zostrix cream   Leukocytosis Likely reactive in nature Persistent but not worsening  HTN New diagnosis Started on amlodipine    Tobacco abuse  Smoking cessation counseling Nicotine  patch ordered   Morbid/class 3 obesity Body mass index is 47.8  kg/m.SABRA  Weight loss should be encouraged Outpatient PCP/bariatric medicine f/u encouraged Significantly low or high BMI is associated with higher medical risk including morbidity and mortality            Consultants: None   Procedures: None   Antibiotics: None    Pain control - [place]  Controlled Substance Reporting System database was reviewed. and patient was instructed, not to drive, operate heavy machinery, perform activities at heights, swimming or participation in water activities or provide baby-sitting services while on Pain, Sleep and Anxiety Medications; until their outpatient Physician has advised to do so again. Also recommended to not to take more than prescribed Pain, Sleep and Anxiety Medications.   Disposition: Home Diet recommendation:  Regular diet DISCHARGE MEDICATION: Allergies as of 01/06/2024   No Known Allergies      Medication List     STOP taking these medications    nicotine  7 mg/24hr patch Commonly known as: NICODERM CQ  - dosed in mg/24 hr       TAKE these medications    amLODipine  5 MG tablet Commonly known as: NORVASC  Take 1 tablet (5 mg total) by mouth daily. Start taking on: January 07, 2024   capsaicin  0.025 % cream Commonly known as: ZOSTRIX Apply topically 2 (two) times daily.   famotidine  20 MG tablet Commonly known as: PEPCID  Take 1 tablet (20 mg total) by mouth 2 (two) times daily.   hydrOXYzine  25 MG tablet Commonly known as: ATARAX  Take 1 tablet (25 mg total) by mouth 3 (three) times daily as needed for anxiety (refractory n/v).   metoCLOPramide  5 MG tablet Commonly known as: Reglan  Take 1 tablet (5 mg total) by mouth 3 (three) times daily.   multivitamin with minerals  tablet Take 1 tablet by mouth daily.   ondansetron  4 MG disintegrating tablet Commonly known as: ZOFRAN -ODT Take 1 tablet (4 mg total) by mouth every 8 (eight) hours as needed for nausea or vomiting. What changed: reasons to take  this   pantoprazole  40 MG tablet Commonly known as: PROTONIX  Take 1 tablet (40 mg total) by mouth daily.   promethazine  25 MG suppository Commonly known as: PHENERGAN  Place 1 suppository (25 mg total) rectally every 6 (six) hours as needed for refractory nausea / vomiting.        Discharge Exam:    Subjective: Reports feeling much better, wants a regular diet and to go home.   Objective: Vitals:   01/06/24 1336 01/06/24 1417  BP: (!) 168/110 (!) 152/105  Pulse:  71  Resp:  19  Temp:  98.4 F (36.9 C)  SpO2:  100%    Intake/Output Summary (Last 24 hours) at 01/06/2024 1444 Last data filed at 01/06/2024 0200 Gross per 24 hour  Intake 421.47 ml  Output --  Net 421.47 ml   Filed Weights   01/03/24 1817  Weight: 122.4 kg    Exam:  General:  Appears more comfortable today, asking to advance to regular diet Eyes:  normal lids, iris ENT:  grossly normal hearing, lips & tongue, mmm Cardiovascular:  RRR. No LE edema.  Respiratory:   CTA bilaterally with no wheezes/rales/rhonchi.  Normal respiratory effort. Abdomen:  soft, NT, ND Skin:  no rash or induration seen on limited exam Musculoskeletal:  grossly normal tone BUE/BLE, good ROM, no bony abnormality Psychiatric:  blunted mood and affect, speech fluent and appropriate, AOx3 Neurologic:  CN 2-12 grossly intact, moves all extremities in coordinated fashion  Data Reviewed: I have reviewed the patient's lab results since admission.  Pertinent labs for today include:   K+ 3.2 CO2 21, stable WBC 15.1, stable    Condition at discharge: improving  The results of significant diagnostics from this hospitalization (including imaging, microbiology, ancillary and laboratory) are listed below for reference.   Imaging Studies: No results found.  Microbiology: Results for orders placed or performed during the hospital encounter of 01/03/24  Resp panel by RT-PCR (RSV, Flu A&B, Covid) Anterior Nasal Swab     Status:  None   Collection Time: 01/03/24 11:22 AM   Specimen: Anterior Nasal Swab  Result Value Ref Range Status   SARS Coronavirus 2 by RT PCR NEGATIVE NEGATIVE Final    Comment: (NOTE) SARS-CoV-2 target nucleic acids are NOT DETECTED.  The SARS-CoV-2 RNA is generally detectable in upper respiratory specimens during the acute phase of infection. The lowest concentration of SARS-CoV-2 viral copies this assay can detect is 138 copies/mL. A negative result does not preclude SARS-Cov-2 infection and should not be used as the sole basis for treatment or other patient management decisions. A negative result may occur with  improper specimen collection/handling, submission of specimen other than nasopharyngeal swab, presence of viral mutation(s) within the areas targeted by this assay, and inadequate number of viral copies(<138 copies/mL). A negative result must be combined with clinical observations, patient history, and epidemiological information. The expected result is Negative.  Fact Sheet for Patients:  BloggerCourse.com  Fact Sheet for Healthcare Providers:  SeriousBroker.it  This test is no t yet approved or cleared by the [place]  FDA and  has been authorized for detection and/or diagnosis of SARS-CoV-2 by FDA under an Emergency Use Authorization (EUA). This EUA will remain  in effect (meaning this test can be  used) for the duration of the COVID-19 declaration under Section 564(b)(1) of the Act, 21 U.S.C.section 360bbb-3(b)(1), unless the authorization is terminated  or revoked sooner.       Influenza A by PCR NEGATIVE NEGATIVE Final   Influenza B by PCR NEGATIVE NEGATIVE Final    Comment: (NOTE) The Xpert Xpress SARS-CoV-2/FLU/RSV plus assay is intended as an aid in the diagnosis of influenza from Nasopharyngeal swab specimens and should not be used as a sole basis for treatment. Nasal washings and aspirates are unacceptable  for Xpert Xpress SARS-CoV-2/FLU/RSV testing.  Fact Sheet for Patients: BloggerCourse.com  Fact Sheet for Healthcare Providers: SeriousBroker.it  This test is not yet approved or cleared by the [place]  FDA and has been authorized for detection and/or diagnosis of SARS-CoV-2 by FDA under an Emergency Use Authorization (EUA). This EUA will remain in effect (meaning this test can be used) for the duration of the COVID-19 declaration under Section 564(b)(1) of the Act, 21 U.S.C. section 360bbb-3(b)(1), unless the authorization is terminated or revoked.     Resp Syncytial Virus by PCR NEGATIVE NEGATIVE Final    Comment: (NOTE) Fact Sheet for Patients: BloggerCourse.com  Fact Sheet for Healthcare Providers: SeriousBroker.it  This test is not yet approved or cleared by the [place]  FDA and has been authorized for detection and/or diagnosis of SARS-CoV-2 by FDA under an Emergency Use Authorization (EUA). This EUA will remain in effect (meaning this test can be used) for the duration of the COVID-19 declaration under Section 564(b)(1) of the Act, 21 U.S.C. section 360bbb-3(b)(1), unless the authorization is terminated or revoked.  Performed at [facility], [address] [place], [place] [address]     Labs: CBC: Recent Labs  Lab 01/03/24 1122 01/04/24 0852 01/05/24 0530 01/06/24 0508  WBC 15.0* 16.9* 14.2* 15.1*  NEUTROABS 10.8*  --   --  11.2*  HGB 14.0 13.3 13.0 14.0  HCT 41.3 41.8 41.2 41.8  MCV 83.1 85.5 85.1 84.6  PLT 375 350 321 346   Basic Metabolic Panel: Recent Labs  Lab 01/03/24 1122 01/03/24 1238 01/04/24 0531 01/04/24 0852 01/05/24 0530 01/06/24 0508  NA 138  --   --  132* 134* 135  K 3.7  --   --  3.3* 3.5 3.2*  CL 105  --   --  97* 99 100  CO2 15*  --   --  20* 21* 21*  GLUCOSE 139*  --   --  134* 109* 100*  BUN 8  --   --  8  10 10   CREATININE 0.78  --   --  0.78 0.73 0.87  CALCIUM 9.5  --   --  9.5 9.2 9.5  MG  --  1.8 2.0  --   --   --   PHOS  --  3.0  --   --   --   --    Liver Function Tests: Recent Labs  Lab 01/03/24 1122  AST 12*  ALT 9  ALKPHOS 84  BILITOT 0.6  PROT 7.8  ALBUMIN 4.4   CBG: No results for input(s): GLUCAP in the last 168 hours.  Discharge time spent: greater than 30 minutes.  Signed: Delon Herald, MD [facility] 01/06/2024

## 2024-01-06 NOTE — Plan of Care (Signed)
  Problem: Clinical Measurements: Goal: Ability to maintain clinical measurements within normal limits will improve Outcome: Progressing Goal: Diagnostic test results will improve Outcome: Progressing   Problem: Nutrition: Goal: Adequate nutrition will be maintained Outcome: Progressing   Problem: Elimination: Goal: Will not experience complications related to urinary retention Outcome: Progressing

## 2024-01-06 NOTE — Plan of Care (Signed)

## 2024-01-06 NOTE — TOC Transition Note (Signed)
 Transition of Care [facility]) - Discharge Note   Patient Details  Name: Christine Trujillo MRN: [id number] Date of Birth: Nov 24, 1995  [facility]) CM/SW Contact:  Sonda Manuella Quill, RN Phone Number: 01/06/2024, 3:08 PM   Clinical Narrative:    D/C orders received; no TOC needs.   Final next level of care: Home/Self Care Barriers to Discharge: No Barriers Identified   Patient Goals and CMS Choice Patient states their goals for this hospitalization and ongoing recovery are:: home          Discharge Placement                       Discharge Plan and Services Additional resources added to the After Visit Summary for     Discharge Planning Services: CM Consult            DME Arranged: N/A DME Agency: NA       HH Arranged: NA HH Agency: NA        Social Drivers of Health (SDOH) Interventions SDOH Screenings   Food Insecurity: No Food Insecurity (01/05/2024)  Housing: Low Risk  (01/05/2024)  Transportation Needs: No Transportation Needs (01/05/2024)  Utilities: Not At Risk (01/05/2024)  Depression (PHQ2-9): Low Risk  (11/22/2022)  Financial Resource Strain: Low Risk  (01/31/2023)  Physical Activity: Unknown (01/31/2023)  Recent Concern: Physical Activity - Inactive (01/31/2023)  Social Connections: Moderately Isolated (01/31/2023)  Stress: Stress Concern Present (01/31/2023)  Tobacco Use: High Risk (01/03/2024)     Readmission Risk Interventions    01/05/2024    4:34 PM  Readmission Risk Prevention Plan  Transportation Screening Complete  PCP or Specialist Appt within 5-7 Days Complete  Home Care Screening Complete  Medication Review (RN CM) Complete

## 2024-01-07 ENCOUNTER — Other Ambulatory Visit (HOSPITAL_COMMUNITY): Payer: Self-pay

## 2024-01-09 ENCOUNTER — Telehealth: Payer: Self-pay

## 2024-01-09 NOTE — Transitions of Care (Post Inpatient/ED Visit) (Signed)
   01/09/2024  Name: Christine Trujillo MRN: [id number] DOB: December 17, 1995  Today's TOC FU Call Status: Today's TOC FU Call Status:: Successful TOC FU Call Completed TOC FU Call Complete Date: 01/09/24  Attempted to reach the patient regarding the most recent Inpatient/ED visit.  Follow Up Plan: Additional outreach attempts will be made to reach the patient to complete the Transitions of Care (Post Inpatient/ED visit) call.   Signature Julian Lemmings, LPN [facility] Nurse Health Advisor Direct Dial [phone]

## 2024-01-11 ENCOUNTER — Inpatient Hospital Stay: Payer: MEDICAID | Admitting: Family Medicine

## 2024-01-12 ENCOUNTER — Telehealth: Payer: Self-pay

## 2024-01-12 NOTE — Telephone Encounter (Signed)
 OK to sched for either the TST or Quant gold in lab. Thx.

## 2024-01-12 NOTE — Telephone Encounter (Signed)
 Hello is it ok to schedule pt?  Copied from CRM #[id number]. Topic: Clinical - Request for Lab/Test Order >> Jan 12, 2024 11:11 AM Christine Trujillo wrote: Reason for CRM: Patient called in to have a TB order placed.

## 2024-01-13 ENCOUNTER — Other Ambulatory Visit: Payer: Self-pay

## 2024-01-13 DIAGNOSIS — Z111 Encounter for screening for respiratory tuberculosis: Secondary | ICD-10-CM

## 2024-01-13 NOTE — Telephone Encounter (Signed)
 Called pt Lvm to call the office back to setup the lab appt for TB test. Lab orders placed.

## 2024-01-17 ENCOUNTER — Other Ambulatory Visit (HOSPITAL_BASED_OUTPATIENT_CLINIC_OR_DEPARTMENT_OTHER): Payer: Self-pay

## 2024-01-23 ENCOUNTER — Encounter: Payer: Self-pay | Admitting: Family Medicine

## 2024-01-23 ENCOUNTER — Other Ambulatory Visit (INDEPENDENT_AMBULATORY_CARE_PROVIDER_SITE_OTHER): Admitting: Family Medicine

## 2024-01-23 ENCOUNTER — Other Ambulatory Visit (HOSPITAL_BASED_OUTPATIENT_CLINIC_OR_DEPARTMENT_OTHER): Payer: Self-pay

## 2024-01-23 ENCOUNTER — Ambulatory Visit (INDEPENDENT_AMBULATORY_CARE_PROVIDER_SITE_OTHER): Admitting: Family Medicine

## 2024-01-23 VITALS — BP 128/84 | HR 69 | Temp 98.0°F | Resp 16 | Ht 63.0 in | Wt 254.0 lb

## 2024-01-23 DIAGNOSIS — M25572 Pain in left ankle and joints of left foot: Secondary | ICD-10-CM | POA: Diagnosis not present

## 2024-01-23 DIAGNOSIS — R0683 Snoring: Secondary | ICD-10-CM | POA: Diagnosis not present

## 2024-01-23 DIAGNOSIS — R5383 Other fatigue: Secondary | ICD-10-CM

## 2024-01-23 DIAGNOSIS — R109 Unspecified abdominal pain: Secondary | ICD-10-CM | POA: Diagnosis not present

## 2024-01-23 DIAGNOSIS — F411 Generalized anxiety disorder: Secondary | ICD-10-CM

## 2024-01-23 DIAGNOSIS — G8929 Other chronic pain: Secondary | ICD-10-CM

## 2024-01-23 MED ORDER — HYDROXYZINE HCL 25 MG PO TABS
25.0000 mg | ORAL_TABLET | Freq: Three times a day (TID) | ORAL | 1 refills | Status: AC | PRN
  Filled 2024-01-23: qty 90, 30d supply, fill #0

## 2024-01-23 MED ORDER — AMITRIPTYLINE HCL 10 MG PO TABS
10.0000 mg | ORAL_TABLET | Freq: Every day | ORAL | 1 refills | Status: DC
  Filled 2024-01-23: qty 30, 30d supply, fill #0

## 2024-01-23 NOTE — Addendum Note (Signed)
 Addended by: DORLENE CHIQUITA RAMAN on: 01/23/2024 03:34 PM   Modules accepted: Orders

## 2024-01-23 NOTE — Patient Instructions (Signed)
 If you do not hear anything about your referral in the next 1-2 weeks, call our office and ask for an update.  Give us  2-3 business days to get the results of your labs back.   Try to get 7-9 hrs of sleep nightly.   Let us  know if you need anything.

## 2024-01-23 NOTE — Progress Notes (Signed)
 Chief Complaint  Patient presents with   Follow-up    Follow Up ER     Subjective: Patient is a 28 y.o. female here for fatigue.  This has been getting worse over the past week.  Diet could be better, has difficulty eating.  She will smoke marijuana and sometimes have bouts of nausea where she goes to the hospital is diagnosed with cannabinoid hyperemesis syndrome.  She has not quit.  She sleeps around 5 hours per night.  She has chronic left ankle pain which she has not followed up with her Ortho for in a while.  This has limited her exercising ability.  She does snore at night.  She has never had a sleep study.  Mood could be better.  No bleeding, diarrhea, nausea, or vomiting currently.  She also has a history of chronic epigastric pain.  She is requesting to see a gastroenterologist.  Denies nighttime awakenings or unintentional weight loss.  Past Medical History:  Diagnosis Date   Anxiety    Cannabinoid hyperemesis syndrome    Depression    GERD (gastroesophageal reflux disease)    History of anemia    during pregnancy   History of gestational diabetes    Ovarian cyst    left   Pelvic pain    Upper respiratory symptom    symptoms started 02-16-2021 cough/ runny nose;  pcp visit 03-10-2021 with sore throat/ congestion/ sob/ body aches/ cough but no fever, negative covid/ flu/ strep resultsin epic  (03-13-2021  pt stated today only has nonproductive cough)    Objective: BP 128/84 (BP Location: Left Arm, Patient Position: Sitting)   Pulse 69   Temp 98 F (36.7 C) (Oral)   Resp 16   Ht 5' 3 (1.6 m)   Wt 254 lb (115.2 kg)   SpO2 98%   BMI 44.99 kg/m  General: Awake, appears stated age Heart: RRR, no LE edema Lungs: CTAB, no rales, wheezes or rhonchi. No accessory muscle use Psych: Age appropriate judgment and insight, normal affect and mood  Assessment and Plan: GAD (generalized anxiety disorder)  Snoring  Chronic abdominal pain  Fatigue, unspecified type - Plan:  VITAMIN D 25 Hydroxy (Vit-D Deficiency, Fractures), TSH, Comprehensive metabolic panel with GFR, CBC  Chronic, not controlled.  Start Elavil 10 mg nightly.  Follow-up in 1 month not in with the GI team yet. Refer to the sleep team. Referred to gastroenterology. Try to get around 7 hours of sleep nightly or more.  Check above labs.  Treat #1 could probably help.  Untreated sleep apnea is also a possible cause. The patient voiced understanding and agreement to the plan.  Mabel Mt Luther, DO 01/23/24  5:04 PM

## 2024-01-24 ENCOUNTER — Other Ambulatory Visit: Payer: Self-pay

## 2024-01-24 ENCOUNTER — Ambulatory Visit: Payer: Self-pay | Admitting: Family Medicine

## 2024-01-24 DIAGNOSIS — E559 Vitamin D deficiency, unspecified: Secondary | ICD-10-CM

## 2024-01-24 LAB — COMPREHENSIVE METABOLIC PANEL WITH GFR
ALT: 8 U/L (ref 0–35)
AST: 10 U/L (ref 0–37)
Albumin: 4.1 g/dL (ref 3.5–5.2)
Alkaline Phosphatase: 63 U/L (ref 39–117)
BUN: 9 mg/dL (ref 6–23)
CO2: 25 meq/L (ref 19–32)
Calcium: 8.8 mg/dL (ref 8.4–10.5)
Chloride: 104 meq/L (ref 96–112)
Creatinine, Ser: 0.85 mg/dL (ref 0.40–1.20)
GFR: 92.67 mL/min (ref >60.00)
Glucose, Bld: 76 mg/dL (ref 70–99)
Potassium: 4.4 meq/L (ref 3.5–5.1)
Sodium: 138 meq/L (ref 135–145)
Total Bilirubin: 0.4 mg/dL (ref 0.2–1.2)
Total Protein: 6.5 g/dL (ref 6.0–8.3)

## 2024-01-24 LAB — CBC
HCT: 38.2 % (ref 36.0–46.0)
Hemoglobin: 12.4 g/dL (ref 12.0–15.0)
MCHC: 32.5 g/dL (ref 30.0–36.0)
MCV: 86.5 fl (ref 78.0–100.0)
Platelets: 369 K/uL (ref 150.0–400.0)
RBC: 4.41 Mil/uL (ref 3.87–5.11)
RDW: 13.7 % (ref 11.5–15.5)
WBC: 10.6 K/uL — ABNORMAL HIGH (ref 4.0–10.5)

## 2024-01-24 LAB — TSH: TSH: 0.91 u[IU]/mL (ref 0.35–5.50)

## 2024-01-24 LAB — VITAMIN D 25 HYDROXY (VIT D DEFICIENCY, FRACTURES): VITD: 15.95 ng/mL — ABNORMAL LOW (ref 30.00–100.00)

## 2024-01-24 MED ORDER — VITAMIN D (ERGOCALCIFEROL) 1.25 MG (50000 UNIT) PO CAPS: 50000.0000 [IU] | ORAL_CAPSULE | ORAL | 0 refills | Status: DC

## 2024-01-26 ENCOUNTER — Ambulatory Visit: Payer: MEDICAID | Admitting: Family Medicine

## 2024-02-06 ENCOUNTER — Other Ambulatory Visit (HOSPITAL_BASED_OUTPATIENT_CLINIC_OR_DEPARTMENT_OTHER): Payer: Self-pay

## 2024-02-06 ENCOUNTER — Other Ambulatory Visit: Payer: Self-pay

## 2024-02-06 ENCOUNTER — Emergency Department (HOSPITAL_BASED_OUTPATIENT_CLINIC_OR_DEPARTMENT_OTHER)
Admission: EM | Admit: 2024-02-06 | Discharge: 2024-02-06 | Disposition: A | Discharge status: Home / Self Care | Attending: Emergency Medicine | Admitting: Emergency Medicine

## 2024-02-06 ENCOUNTER — Encounter (HOSPITAL_BASED_OUTPATIENT_CLINIC_OR_DEPARTMENT_OTHER): Payer: Self-pay | Admitting: *Deleted

## 2024-02-06 DIAGNOSIS — K029 Dental caries, unspecified: Secondary | ICD-10-CM | POA: Insufficient documentation

## 2024-02-06 DIAGNOSIS — K0889 Other specified disorders of teeth and supporting structures: Secondary | ICD-10-CM

## 2024-02-06 MED ORDER — OXYCODONE HCL 5 MG PO TABS: 5.0000 mg | ORAL_TABLET | Freq: Four times a day (QID) | ORAL | 0 refills | Status: DC | PRN

## 2024-02-06 MED ORDER — AMOXICILLIN-POT CLAVULANATE 875-125 MG PO TABS
1.0000 | ORAL_TABLET | Freq: Once | ORAL | Status: AC
Start: 2024-02-06 — End: 2024-02-06
  Administered 2024-02-06: 1 via ORAL
  Filled 2024-02-06: qty 1

## 2024-02-06 MED ORDER — AMOXICILLIN-POT CLAVULANATE 875-125 MG PO TABS: 1.0000 | ORAL_TABLET | Freq: Two times a day (BID) | ORAL | 0 refills | Status: DC

## 2024-02-06 NOTE — ED Provider Notes (Signed)
 [facility] Provider Note   CSN: [id number] Arrival date & time: 02/06/24  1953     Patient presents with: Dental Pain   Christine Trujillo is a 28 y.o. female.   Left lower dental pain that started today.  Tylenol  ibuprofen  have not helped.  Patient drove here.  She denies any trismus drooling.  Nothing makes it worse or better.  History of hyperemesis.  No difficulty opening her mouth.  No facial swelling.  The history is provided by the patient.       Prior to Admission medications   Medication Sig Start Date End Date Taking? Authorizing Provider  amoxicillin -clavulanate (AUGMENTIN) 875-125 MG tablet Take 1 tablet by mouth every 12 (twelve) hours. 02/06/24  Yes Tykiera Raven, DO  oxyCODONE  (ROXICODONE ) 5 MG immediate release tablet Take 1 tablet (5 mg total) by mouth every 6 (six) hours as needed for up to 5 doses for breakthrough pain. 02/06/24  Yes Harmoney Sienkiewicz, DO  amitriptyline (ELAVIL) 10 MG tablet Take 1 tablet (10 mg total) by mouth at bedtime. 01/23/24   Frann Mabel Mt, DO  amLODipine  (NORVASC ) 5 MG tablet Take 1 tablet (5 mg total) by mouth daily. 01/07/24   Barbarann Nest, MD  capsaicin  (ZOSTRIX) 0.025 % cream Apply topically 2 (two) times daily. 01/06/24   Barbarann Nest, MD  famotidine  (PEPCID ) 20 MG tablet Take 1 tablet (20 mg total) by mouth 2 (two) times daily. 07/11/23   Frann Mabel Mt, DO  hydrOXYzine  (ATARAX ) 25 MG tablet Take 1 tablet (25 mg total) by mouth 3 (three) times daily as needed for anxiety (refractory n/v). 01/23/24   Frann Mabel Mt, DO  metoCLOPramide  (REGLAN ) 10 MG tablet Take 0.5 tablets (5 mg total) by mouth 3 (three) times daily. 01/06/24 01/05/25  Barbarann Nest, MD  Multiple Vitamins-Minerals (MULTIVITAMIN WITH MINERALS) tablet Take 1 tablet by mouth daily.    [provider]  ondansetron  (ZOFRAN -ODT) 4 MG disintegrating tablet Take 1 tablet (4 mg total) by mouth  every 8 (eight) hours as needed for nausea or vomiting. Patient taking differently: Take 4 mg by mouth every 8 (eight) hours as needed for nausea or vomiting (dissolve orally). 10/08/23   Roselyn Carlin NOVAK, MD  pantoprazole  (PROTONIX ) 40 MG tablet Take 1 tablet (40 mg total) by mouth daily. 10/12/23   Rai, Nydia POUR, MD  promethazine  (PHENERGAN ) 25 MG suppository Place 1 suppository (25 mg total) rectally every 6 (six) hours as needed for refractory nausea / vomiting. 01/06/24   Barbarann Nest, MD  Vitamin D, Ergocalciferol, (DRISDOL) 1.25 MG (50000 UNIT) CAPS capsule Take 1 capsule (50,000 Units total) by mouth every 7 (seven) days. 01/24/24   Frann Mabel Mt, DO    Allergies: Patient has no known allergies.    Review of Systems  Updated Vital Signs BP 138/88 (BP Location: Right Arm)   Pulse 63   Temp 97.9 F (36.6 C)   Resp 20   SpO2 100%   Physical Exam Constitutional:      General: She is not in acute distress.    Appearance: She is not ill-appearing.  HENT:     Head: Normocephalic and atraumatic.     Comments: Area with dental caries in the left lower mouth but there is no trismus drooling submandibular swelling or facial swelling    Nose: Nose normal.     Mouth/Throat:     Mouth: Mucous membranes are moist.  Eyes:     Extraocular Movements: Extraocular  movements intact.     Pupils: Pupils are equal, round, and reactive to light.  Neurological:     Mental Status: She is alert.     (all labs ordered are listed, but only abnormal results are displayed) Labs Reviewed - No data to display  EKG: None  Radiology: No results found.   Procedures   Medications Ordered in the ED  amoxicillin -clavulanate (AUGMENTIN) 875-125 MG per tablet 1 tablet (has no administration in time range)                                    Medical Decision Making Risk Prescription drug management.   Christine Trujillo is here with dental pain.  She has got chronic dental carry  in the left lower mouth.  There is no focal swelling.  No submandibular swelling no facial swelling.  No trismus.  I think that this is likely nerve root related pain.  Will put her on antibiotics.  She is taking Tylenol  and ibuprofen  with no relief of pain.  Will prescribe a small course of oxycodone  for breakthrough pain.  Continue to use over-the-counter medications as well.  She has a dentist that she states she can follow-up with.  Overall discharge in good condition.  Understands return precautions.  This chart was dictated using voice recognition software.  Despite best efforts to proofread,  errors can occur which can change the documentation meaning.      Final diagnoses:  Pain, dental    ED Discharge Orders          Ordered    oxyCODONE  (ROXICODONE ) 5 MG immediate release tablet  Every 6 hours PRN        02/06/24 2005    amoxicillin -clavulanate (AUGMENTIN) 875-125 MG tablet  Every 12 hours        02/06/24 2005               Christine Cornet, DO 02/06/24 2007

## 2024-02-06 NOTE — Discharge Instructions (Signed)
 Continue antibiotics prescribed.  Recommend 800 mg ibuprofen  every 8 hours as needed for pain.  Recommend 1000 mg of Tylenol  every 6 hours as needed for pain.  Have written you for narcotic pain medicine called oxycodone  for breakthrough pain.  Follow-up with your dentist as we discussed.

## 2024-02-06 NOTE — ED Triage Notes (Signed)
 Pt is here for dental pain which began at 5pm today. Left lower

## 2024-02-09 ENCOUNTER — Emergency Department (HOSPITAL_BASED_OUTPATIENT_CLINIC_OR_DEPARTMENT_OTHER)
Admission: EM | Admit: 2024-02-09 | Discharge: 2024-02-09 | Disposition: A | Discharge status: Home / Self Care | Attending: Emergency Medicine | Admitting: Emergency Medicine

## 2024-02-09 ENCOUNTER — Other Ambulatory Visit: Payer: Self-pay

## 2024-02-09 ENCOUNTER — Encounter (HOSPITAL_BASED_OUTPATIENT_CLINIC_OR_DEPARTMENT_OTHER): Payer: Self-pay

## 2024-02-09 ENCOUNTER — Other Ambulatory Visit (HOSPITAL_BASED_OUTPATIENT_CLINIC_OR_DEPARTMENT_OTHER): Payer: Self-pay

## 2024-02-09 DIAGNOSIS — K029 Dental caries, unspecified: Secondary | ICD-10-CM | POA: Insufficient documentation

## 2024-02-09 DIAGNOSIS — K0889 Other specified disorders of teeth and supporting structures: Secondary | ICD-10-CM

## 2024-02-09 DIAGNOSIS — K0381 Cracked tooth: Secondary | ICD-10-CM | POA: Insufficient documentation

## 2024-02-09 MED ORDER — OXYCODONE-ACETAMINOPHEN 5-325 MG PO TABS
1.0000 | ORAL_TABLET | Freq: Once | ORAL | Status: AC
  Administered 2024-02-09: 1 via ORAL
  Filled 2024-02-09: qty 1

## 2024-02-09 MED ORDER — KETOROLAC TROMETHAMINE 15 MG/ML IJ SOLN
30.0000 mg | Freq: Once | INTRAMUSCULAR | Status: AC
  Administered 2024-02-09: 30 mg via INTRAMUSCULAR
  Filled 2024-02-09: qty 2

## 2024-02-09 MED ORDER — AMOXICILLIN-POT CLAVULANATE 875-125 MG PO TABS
1.0000 | ORAL_TABLET | Freq: Two times a day (BID) | ORAL | 0 refills | Status: AC
  Filled 2024-02-09 – 2024-02-10 (×2): qty 10, 5d supply, fill #0

## 2024-02-09 MED ORDER — AMOXICILLIN-POT CLAVULANATE 875-125 MG PO TABS
1.0000 | ORAL_TABLET | Freq: Once | ORAL | Status: AC
  Administered 2024-02-09: 1 via ORAL
  Filled 2024-02-09: qty 1

## 2024-02-09 NOTE — ED Triage Notes (Signed)
 Patient arrived POV with complaint of tooth broken on left upper side yesterday.  Reports pain

## 2024-02-09 NOTE — ED Provider Notes (Signed)
 [place] [facility] Provider Note   CSN: [id number] Arrival date & time: 02/09/24  0033     History Chief Complaint  Patient presents with   Dental Pain    HPI Christine Trujillo is a 28 y.o. female presenting for ongoing dental pain.  She has very advanced dental caries.  Seen 3 days ago for similar secondary to pulpitis of a fractured lower tooth.  Tonight while she was eating candy, a left upper tooth fractured.  Denies fevers chills nausea vomiting syncope shortness of breath.  Otherwise ambulatory tolerating p.o. intake.  Did not pick up any medications prescribed.  Asked him to be moved to the pharmacy on campus here.   Patient's recorded medical, surgical, social, medication list and allergies were reviewed in the Snapshot window as part of the initial history.   Review of Systems   Review of Systems  Constitutional:  Negative for chills and fever.  HENT:  Positive for dental problem. Negative for ear pain and sore throat.   Eyes:  Negative for pain and visual disturbance.  Respiratory:  Negative for cough and shortness of breath.   Cardiovascular:  Negative for chest pain and palpitations.  Gastrointestinal:  Negative for abdominal pain and vomiting.  Genitourinary:  Negative for dysuria and hematuria.  Musculoskeletal:  Negative for arthralgias and back pain.  Skin:  Negative for color change and rash.  Neurological:  Negative for seizures and syncope.  All other systems reviewed and are negative.   Physical Exam Updated Vital Signs BP (!) 155/106 (BP Location: Right Arm)   Pulse 66   Temp 97.6 F (36.4 C) (Oral)   Resp 18   Ht 5' 3 (1.6 m)   Wt 115.2 kg   SpO2 100%   BMI 44.99 kg/m  Physical Exam Vitals and nursing note reviewed.  Constitutional:      General: She is not in acute distress.    Appearance: She is well-developed.  HENT:     Head: Normocephalic and atraumatic.     Mouth/Throat:     Comments: Area  advanced dental disease.  Multiple fractured teeth, visible caries and erosions throughout all residual teeth. Eyes:     Conjunctiva/sclera: Conjunctivae normal.  Cardiovascular:     Rate and Rhythm: Normal rate and regular rhythm.     Heart sounds: No murmur heard. Pulmonary:     Effort: Pulmonary effort is normal. No respiratory distress.     Breath sounds: Normal breath sounds.  Abdominal:     General: There is no distension.     Palpations: Abdomen is soft.     Tenderness: There is no abdominal tenderness. There is no right CVA tenderness or left CVA tenderness.  Musculoskeletal:        General: No swelling or tenderness. Normal range of motion.     Cervical back: Neck supple.  Skin:    General: Skin is warm and dry.  Neurological:     General: No focal deficit present.     Mental Status: She is alert and oriented to person, place, and time. Mental status is at baseline.     Cranial Nerves: No cranial nerve deficit.      ED Course/ Medical Decision Making/ A&P    Procedures Procedures   Medications Ordered in ED Medications  oxyCODONE -acetaminophen  (PERCOCET/ROXICET) 5-325 MG per tablet 1 tablet (has no administration in time range)  ketorolac  (TORADOL ) 15 MG/ML injection 30 mg (has no administration in time range)  amoxicillin -clavulanate (  AUGMENTIN) 875-125 MG per tablet 1 tablet (has no administration in time range)  Medical Decision Making:   Vonita Calloway is a 28 y.o. female who presented to the ED today with dental pain detailed above.    Complete initial physical exam performed, notably the patient was HDS in no acute distress. No obvious intraoral lesions or swelling. Poor dentition diffusely    Reviewed and confirmed nursing documentation for past medical history, family history, social history.    Initial Assessment:   With the patient's presentation of dental pain, most likely diagnosis is reversible vs irreversible pulpitis. Other diagnoses were  considered including (but not limited to) ludwig's angina, osteitis, dental abscess, PTA, RPA. These are considered less likely due to history of present illness and physical exam findings.   This is most consistent with an acute complicated illness  Initial Plan:  Symptomatic management with NSAIDS/Tylenol .  Patient stated no chance that she was pregnant. Due to clinical overlap with potentially reversible pulpitis, antibiotics prescribed Patient will need definitive management with dentistry. Provided low cost/local dental resource chart provided by social work.  Disposition:  I have considered need for hospitalization, however, considering all of the above, I believe this patient is stable for discharge at this time.  Patient/family educated about specific return precautions for given chief complaint and symptoms.  Patient/family educated about follow-up with PCP and dentistry.    Patient/family expressed understanding of return precautions and need for follow-up. Patient spoken to regarding all imaging and laboratory results and appropriate follow up for these results. All education provided in verbal form with additional information in written form. Time was allowed for answering of patient questions. Patient discharged.      Clinical Impression:  1. Pain, dental      Discharge   Final Clinical Impression(s) / ED Diagnoses Final diagnoses:  Pain, dental    Rx / DC Orders ED Discharge Orders          Ordered    amoxicillin -clavulanate (AUGMENTIN) 875-125 MG tablet  Every 12 hours        02/09/24 0057              Jerral Meth, MD 02/09/24 [phone]

## 2024-02-10 ENCOUNTER — Other Ambulatory Visit: Payer: Self-pay

## 2024-02-10 ENCOUNTER — Other Ambulatory Visit (HOSPITAL_BASED_OUTPATIENT_CLINIC_OR_DEPARTMENT_OTHER): Payer: Self-pay

## 2024-02-10 ENCOUNTER — Encounter (HOSPITAL_BASED_OUTPATIENT_CLINIC_OR_DEPARTMENT_OTHER): Payer: Self-pay

## 2024-02-10 ENCOUNTER — Emergency Department (HOSPITAL_BASED_OUTPATIENT_CLINIC_OR_DEPARTMENT_OTHER)
Admission: EM | Admit: 2024-02-10 | Discharge: 2024-02-10 | Disposition: A | Discharge status: Home / Self Care | Attending: Emergency Medicine | Admitting: Emergency Medicine

## 2024-02-10 DIAGNOSIS — K029 Dental caries, unspecified: Secondary | ICD-10-CM | POA: Insufficient documentation

## 2024-02-10 DIAGNOSIS — X58XXXA Exposure to other specified factors, initial encounter: Secondary | ICD-10-CM | POA: Diagnosis not present

## 2024-02-10 DIAGNOSIS — T391X1A Poisoning by 4-Aminophenol derivatives, accidental (unintentional), initial encounter: Secondary | ICD-10-CM | POA: Insufficient documentation

## 2024-02-10 DIAGNOSIS — R799 Abnormal finding of blood chemistry, unspecified: Secondary | ICD-10-CM | POA: Diagnosis not present

## 2024-02-10 DIAGNOSIS — T50901A Poisoning by unspecified drugs, medicaments and biological substances, accidental (unintentional), initial encounter: Secondary | ICD-10-CM | POA: Diagnosis present

## 2024-02-10 LAB — CBC WITH DIFFERENTIAL/PLATELET
Abs Immature Granulocytes: 0.03 K/uL (ref 0.00–0.07)
Basophils Absolute: 0 K/uL (ref 0.0–0.1)
Basophils Relative: 0 %
Eosinophils Absolute: 0.1 K/uL (ref 0.0–0.5)
Eosinophils Relative: 1 %
HCT: 36.7 % (ref 36.0–46.0)
Hemoglobin: 12 g/dL (ref 12.0–15.0)
Immature Granulocytes: 0 %
Lymphocytes Relative: 34 %
Lymphs Abs: 3.8 K/uL (ref 0.7–4.0)
MCH: 28.2 pg (ref 26.0–34.0)
MCHC: 32.7 g/dL (ref 30.0–36.0)
MCV: 86.2 fL (ref 80.0–100.0)
Monocytes Absolute: 0.9 K/uL (ref 0.1–1.0)
Monocytes Relative: 8 %
Neutro Abs: 6.4 K/uL (ref 1.7–7.7)
Neutrophils Relative %: 57 %
Platelets: 318 K/uL (ref 150–400)
RBC: 4.26 MIL/uL (ref 3.87–5.11)
RDW: 14 % (ref 11.5–15.5)
WBC: 11.2 K/uL — ABNORMAL HIGH (ref 4.0–10.5)
nRBC: 0 % (ref 0.0–0.2)

## 2024-02-10 LAB — COMPREHENSIVE METABOLIC PANEL WITH GFR
ALT: 7 U/L (ref 0–44)
AST: 11 U/L — ABNORMAL LOW (ref 15–41)
Albumin: 4.1 g/dL (ref 3.5–5.0)
Alkaline Phosphatase: 64 U/L (ref 38–126)
Anion gap: 11 (ref 5–15)
BUN: 10 mg/dL (ref 6–20)
CO2: 23 mmol/L (ref 22–32)
Calcium: 8.8 mg/dL — ABNORMAL LOW (ref 8.9–10.3)
Chloride: 106 mmol/L (ref 98–111)
Creatinine, Ser: 0.84 mg/dL (ref 0.44–1.00)
GFR, Estimated: >60 mL/min (ref >60)
Glucose, Bld: 85 mg/dL (ref 70–99)
Potassium: 3.6 mmol/L (ref 3.5–5.1)
Sodium: 139 mmol/L (ref 135–145)
Total Bilirubin: 0.3 mg/dL (ref 0.0–1.2)
Total Protein: 6.7 g/dL (ref 6.5–8.1)

## 2024-02-10 LAB — CBG MONITORING, ED: Glucose-Capillary: 91 mg/dL (ref 70–99)

## 2024-02-10 LAB — SALICYLATE LEVEL: Salicylate Lvl: <7 mg/dL — ABNORMAL LOW (ref 7.0–30.0)

## 2024-02-10 LAB — ACETAMINOPHEN LEVEL: Acetaminophen (Tylenol), Serum: <10 ug/mL — ABNORMAL LOW (ref 10–30)

## 2024-02-10 LAB — MAGNESIUM: Magnesium: 2.1 mg/dL (ref 1.7–2.4)

## 2024-02-10 LAB — ETHANOL: Alcohol, Ethyl (B): <15 mg/dL (ref <15)

## 2024-02-10 MED ORDER — BUPIVACAINE-EPINEPHRINE (PF) 0.5% -1:200000 IJ SOLN: 10.0000 mL | Freq: Once | INTRAMUSCULAR | Status: DC

## 2024-02-10 MED ORDER — LIDOCAINE VISCOUS HCL 2 % MT SOLN
15.0000 mL | Freq: Once | OROMUCOSAL | Status: AC
  Administered 2024-02-10: 15 mL via OROMUCOSAL
  Filled 2024-02-10: qty 15

## 2024-02-10 MED ORDER — FENTANYL CITRATE (PF) 50 MCG/ML IJ SOSY
50.0000 ug | PREFILLED_SYRINGE | Freq: Once | INTRAMUSCULAR | Status: AC
  Administered 2024-02-10: 50 ug via INTRAVENOUS
  Filled 2024-02-10: qty 1

## 2024-02-10 MED ORDER — LIDOCAINE-EPINEPHRINE (PF) 2 %-1:200000 IJ SOLN
INTRAMUSCULAR | Status: AC
  Filled 2024-02-10: qty 20

## 2024-02-10 MED ORDER — SODIUM CHLORIDE 0.9 % IV BOLUS
1000.0000 mL | Freq: Once | INTRAVENOUS | Status: AC
  Administered 2024-02-10: 1000 mL via INTRAVENOUS

## 2024-02-10 NOTE — Discharge Instructions (Signed)
 You were seen for your dental pain in the emergency department.   At home, please take Tylenol  and ibuprofen  for your pain - DO NOT EXCEED RECOMMENDED DOSES.   Take the augmentin to treat any infection.  Follow-up with a dentist in 2-3 days regarding your visit.    Return immediately to the emergency department if you experience any of the following: Worsening pain, fever, difficulty swallowing, difficulty breathing, or any other concerning symptoms.    Thank you for visiting our Emergency Department. It was a pleasure taking care of you today.

## 2024-02-10 NOTE — ED Notes (Signed)
Pt declined to provide urine sample.

## 2024-02-10 NOTE — ED Triage Notes (Signed)
 Pt to ED from home with c/o dental pain and possible tylenol  OD. Pt endorses taking between 30-50/500mg  tylenol  in the past 5 hours with no relief of dental pain. Pt is now concerned she has taken a toxic amount of tylenol . Arrives A+O, VSS.

## 2024-02-10 NOTE — ED Provider Notes (Signed)
 [place] [facility] Provider Note   CSN: [id number] Arrival date & time: 02/10/24  2032     Patient presents with: Drug Overdose and Dental Pain   Christine Trujillo is a 28 y.o. female.   28 year old female with a history of anxiety, and depression who presents emergency department toothache.  Patient reports that for the past several days she has had a toothache on an upper and lower molar that are cracked.  Has been seen twice in the emergency department.  Started on Augmentin and was given some pain medication.  Says that she ran out and has been taking lots of Tylenol  and ibuprofen  today.  Suspects that she has taken twenty 500 mg tablets of Tylenol  since 6 AM this morning.  Told triage that she may have taken 30-50 Tylenol  in the last 5 hours.  Says it was not a suicide attempt and she does not want to end her life she just wants the pain stopped.  No HI or AVH.  Also has taken approximately 10 ibuprofen  during that time as well.  No other drug use.  No alcohol use  No fevers or chills.  No swelling of her mouth.  No difficulty swallowing.  Says the pain is mostly in her jaw and moving up her face towards her eye.        Prior to Admission medications   Medication Sig Start Date End Date Taking? Authorizing Provider  amitriptyline (ELAVIL) 10 MG tablet Take 1 tablet (10 mg total) by mouth at bedtime. 01/23/24   Frann Mabel Mt, DO  amLODipine  (NORVASC ) 5 MG tablet Take 1 tablet (5 mg total) by mouth daily. 01/07/24   Barbarann Nest, MD  amoxicillin -clavulanate (AUGMENTIN) 875-125 MG tablet Take 1 tablet by mouth every 12 (twelve) hours for 5 days. 02/09/24 02/14/24  Jerral Meth, MD  capsaicin  (ZOSTRIX) 0.025 % cream Apply topically 2 (two) times daily. 01/06/24   Barbarann Nest, MD  famotidine  (PEPCID ) 20 MG tablet Take 1 tablet (20 mg total) by mouth 2 (two) times daily. 07/11/23   Frann Mabel Mt, DO  hydrOXYzine  (ATARAX )  25 MG tablet Take 1 tablet (25 mg total) by mouth 3 (three) times daily as needed for anxiety (refractory n/v). 01/23/24   Frann Mabel Mt, DO  metoCLOPramide  (REGLAN ) 10 MG tablet Take 0.5 tablets (5 mg total) by mouth 3 (three) times daily. 01/06/24 01/05/25  Barbarann Nest, MD  Multiple Vitamins-Minerals (MULTIVITAMIN WITH MINERALS) tablet Take 1 tablet by mouth daily.    [provider]  ondansetron  (ZOFRAN -ODT) 4 MG disintegrating tablet Take 1 tablet (4 mg total) by mouth every 8 (eight) hours as needed for nausea or vomiting. Patient taking differently: Take 4 mg by mouth every 8 (eight) hours as needed for nausea or vomiting (dissolve orally). 10/08/23   Roselyn Carlin NOVAK, MD  pantoprazole  (PROTONIX ) 40 MG tablet Take 1 tablet (40 mg total) by mouth daily. 10/12/23   Rai, Nydia POUR, MD  promethazine  (PHENERGAN ) 25 MG suppository Place 1 suppository (25 mg total) rectally every 6 (six) hours as needed for refractory nausea / vomiting. 01/06/24   Barbarann Nest, MD  Vitamin D, Ergocalciferol, (DRISDOL) 1.25 MG (50000 UNIT) CAPS capsule Take 1 capsule (50,000 Units total) by mouth every 7 (seven) days. 01/24/24   Frann Mabel Mt, DO    Allergies: Patient has no known allergies.    Review of Systems  Updated Vital Signs BP (!) 142/85   Pulse 75   Temp  98 F (36.7 C) (Oral)   Resp (!) 23   Ht 5' 3 (1.6 m)   Wt 115.2 kg   LMP 02/09/2024 (Exact Date)   SpO2 99%   BMI 44.99 kg/m   Physical Exam Constitutional:      Appearance: Normal appearance.  HENT:     Head: Normocephalic and atraumatic.     Right Ear: External ear normal.     Left Ear: External ear normal.     Nose: Nose normal.     Mouth/Throat:      Comments: Uvula midline.  No brawny edema under the tongue.  No other submandibular swelling.  No periapical abscess palpated. Eyes:     Extraocular Movements: Extraocular movements intact.     Conjunctiva/sclera: Conjunctivae normal.     Pupils:  Pupils are equal, round, and reactive to light.  Cardiovascular:     Rate and Rhythm: Normal rate and regular rhythm.     Pulses: Normal pulses.     Heart sounds: Normal heart sounds.  Pulmonary:     Effort: Pulmonary effort is normal.     Breath sounds: Normal breath sounds.  Neurological:     Mental Status: She is alert and oriented to person, place, and time. Mental status is at baseline.     (all labs ordered are listed, but only abnormal results are displayed) Labs Reviewed  COMPREHENSIVE METABOLIC PANEL WITH GFR - Abnormal; Notable for the following components:      Result Value   Calcium 8.8 (*)    AST 11 (*)    All other components within normal limits  SALICYLATE LEVEL - Abnormal; Notable for the following components:   Salicylate Lvl <7.0 (*)    All other components within normal limits  ACETAMINOPHEN  LEVEL - Abnormal; Notable for the following components:   Acetaminophen  (Tylenol ), Serum <10 (*)    All other components within normal limits  CBC WITH DIFFERENTIAL/PLATELET - Abnormal; Notable for the following components:   WBC 11.2 (*)    All other components within normal limits  ETHANOL  MAGNESIUM   URINE DRUG SCREEN  PREGNANCY, URINE  CBG MONITORING, ED    EKG: EKG Interpretation Date/Time:  Friday February 10 2024 21:35:37 EDT Ventricular Rate:  67 PR Interval:  137 QRS Duration:  91 QT Interval:  412 QTC Calculation: 435 R Axis:   57  Text Interpretation: Sinus rhythm Confirmed by Yolande Charleston [phone]) on 02/10/2024 9:45:21 PM  Radiology: No results found.   Arturo Block  Date/Time: 02/10/2024 10:45 PM  Performed by: Yolande Charleston BROCKS, MD Authorized by: Yolande Charleston BROCKS, MD   Consent:    Consent obtained:  Verbal   Consent given by:  Patient   Risks discussed:  Allergic reaction, infection, nerve damage, swelling, intravenous injection, pain, unsuccessful block and bleeding Universal protocol:    Patient identity confirmed:  Verbally  with patient Indications:    Indications:  Pain relief Location:    Body area:  Head   Head nerve blocked: Inferior alveolar.   Laterality:  Left Skin anesthesia:    Skin anesthesia method:  Topical application   Topical anesthetic:  Lidocaine  gel Procedure details:    Block needle gauge:  25 G   Anesthetic injected:  Lidocaine  1% w/o epi   Steroid injected:  None   Additive injected:  None   Paresthesia:  None Post-procedure details:    Outcome:  Pain relieved   Procedure completion:  Tolerated well, no immediate complications Comments:  Inferior valvular nerve block for dental pain relief performed.  2 mL of 1% lidocaine  without epinephrine  injected. Arturo Block  Date/Time: 02/10/2024 10:46 PM  Performed by: Yolande Lamar BROCKS, MD Authorized by: Yolande Lamar BROCKS, MD   Consent:    Consent obtained:  Verbal   Consent given by:  Patient   Risks discussed:  Allergic reaction, infection, nerve damage, swelling, unsuccessful block, pain, intravenous injection and bleeding   Alternatives discussed:  No treatment Indications:    Indications:  Pain relief Location:    Body area:  Head   Head nerve blocked: Superior posterior alveolar.   Laterality:  Left Skin anesthesia:    Skin anesthesia method:  Topical application   Topical anesthetic:  Lidocaine  gel Procedure details:    Block needle gauge:  25 G   Anesthetic injected:  Lidocaine  1% w/o epi   Steroid injected:  None   Additive injected:  None   Injection procedure:  Negative aspiration for blood   Paresthesia:  None Post-procedure details:    Outcome:  Pain relieved   Procedure completion:  Tolerated well, no immediate complications Comments:     Superior posterior alveolar nerve block performed.  2 mL of 1% lidocaine  without epinephrine  injected    Medications Ordered in the ED  lidocaine  (XYLOCAINE ) 2 % viscous mouth solution 15 mL (15 mLs Mouth/Throat Given 02/10/24 2116)  lidocaine -EPINEPHrine   (XYLOCAINE  W/EPI) 2 %-1:200000 (PF) injection (  Given by Other 02/10/24 2117)  fentaNYL  (SUBLIMAZE ) injection 50 mcg (50 mcg Intravenous Given 02/10/24 2143)  sodium chloride  0.9 % bolus 1,000 mL (1,000 mLs Intravenous New Bag/Given 02/10/24 2233)    Clinical Course as of 02/10/24 2336  Fri Feb 10, 2024  2145 Poison control contacted. [RP]  2310 Poison control contacted. Feels that dose is likely subtoxic even with the highest estimate that she gave of 50 pills of 250 mg of tylenol . They are closing the case at this point in time.  [RP]    Clinical Course User Index [RP] Yolande Lamar BROCKS, MD                                 Medical Decision Making Amount and/or Complexity of Data Reviewed Labs: ordered.  Risk Prescription drug management.   Deatrice Spanbauer is a 28 y.o. female with comorbidities that complicate the patient evaluation including dental caries, anxiety, and depression who presents to the emergency department with dental pain  Initial Ddx:  Dental carie, periapical abscess, deep space infection, Ludwig's angina  MDM/course:  Feel that patient likely has a dental carie or periapical abscess that is causing their symptoms.  Has already been seen twice for this.  Was given Augmentin and oxycodone  but is having persistent pain and decided to take a very large amount of tylenol  and ibuprofen .  Given their exam feel that deep space infection is less likely so we will hold off on CT imaging.  No signs of Ludwig's angina or submandibular swelling at this time.  Performed inferior alveolar nerve block and superior posterior alveolar nerve block with successful relief of her pain.  The amount of Tylenol  she is reporting ingesting is somewhat concerning.  She is given varying accounts but it sounds like she believes she took approximately 20 dual action acetaminophen  ibuprofen  tablets from 7 AM to 7 PM.  She had a workup for overdose and Poison control was contacted.  Her Tylenol   level was undetectably  low as it was her salicylate level.  Suspect she might have not taken as much of these medications that she was reporting.  Discussed with significant other Mr Danney since the blood work and her story are not adding up. Says pt has been taking tylenol  and the oxycodone  she was prescribed. Has been trying to get a dentist appointment but has had issues with dentists that will accept her. Says she has not been depressed or suicidal. No AVH. At this time low concern for this being a suicide attempt.   Patient was observed for several hours in the emergency department did not develop any concerning symptoms.  Her LFTs were also normal.  Discussed again with poison control who has cleared her and is closing the case.  Patient counseled on the importance of not taking these medications in excess and the fact that it could lead to irreversible liver damage and death.  Counseled to continue her Augmentin and pain medication as prescribed and follow-up with dentistry soon as possible.  This patient presents to the ED for concern of complaints listed in HPI, this involves an extensive number of treatment options, and is a complaint that carries with it a high risk of complications and morbidity. Disposition including potential need for admission considered.   Dispo: DC Home. Return precautions discussed including, but not limited to, those listed in the AVS. Allowed pt time to ask questions which were answered fully prior to dc.  Additional history obtained from spouse Records reviewed Outpatient Clinic Notes The following labs were independently interpreted: Chemistry and show no acute abnormality I personally reviewed and interpreted cardiac monitoring: normal sinus rhythm  I personally reviewed and interpreted the pt's EKG: see above for interpretation  I have reviewed the patients home medications and made adjustments as needed Consults: Poison control   Final diagnoses:  Pain due  to dental caries  Tylenol  overdose, accidental or unintentional, initial encounter    ED Discharge Orders     None          Yolande Lamar BROCKS, MD 02/10/24 2340

## 2024-02-10 NOTE — ED Notes (Addendum)
 Per poison control- repeat tylenol  level at 11p.  No further action needed at this time. EDP Jakie made aware.

## 2024-02-10 NOTE — ED Notes (Signed)
 Notified poison control of pts ingestion- Spoke with Patty- recommends IV fluids, check magnesium  level.   Waiting for lab results.  RN to update when results of labwork and EKG are available.

## 2024-02-10 NOTE — ED Notes (Signed)
 Repeat tylenol  level has been sent to the lab at this time.

## 2024-02-13 ENCOUNTER — Encounter: Admitting: Obstetrics & Gynecology

## 2024-02-17 ENCOUNTER — Encounter (HOSPITAL_BASED_OUTPATIENT_CLINIC_OR_DEPARTMENT_OTHER): Payer: Self-pay | Admitting: Emergency Medicine

## 2024-02-17 ENCOUNTER — Emergency Department (HOSPITAL_BASED_OUTPATIENT_CLINIC_OR_DEPARTMENT_OTHER)
Admission: EM | Admit: 2024-02-17 | Discharge: 2024-02-17 | Disposition: A | Discharge status: Home / Self Care | Attending: Emergency Medicine | Admitting: Emergency Medicine

## 2024-02-17 ENCOUNTER — Other Ambulatory Visit (HOSPITAL_BASED_OUTPATIENT_CLINIC_OR_DEPARTMENT_OTHER): Payer: Self-pay

## 2024-02-17 ENCOUNTER — Other Ambulatory Visit: Payer: Self-pay

## 2024-02-17 ENCOUNTER — Encounter (HOSPITAL_BASED_OUTPATIENT_CLINIC_OR_DEPARTMENT_OTHER): Payer: Self-pay | Admitting: *Deleted

## 2024-02-17 DIAGNOSIS — I10 Essential (primary) hypertension: Secondary | ICD-10-CM | POA: Diagnosis not present

## 2024-02-17 DIAGNOSIS — K0401 Reversible pulpitis: Secondary | ICD-10-CM | POA: Insufficient documentation

## 2024-02-17 DIAGNOSIS — R1110 Vomiting, unspecified: Secondary | ICD-10-CM

## 2024-02-17 DIAGNOSIS — K0889 Other specified disorders of teeth and supporting structures: Secondary | ICD-10-CM | POA: Diagnosis present

## 2024-02-17 DIAGNOSIS — R197 Diarrhea, unspecified: Secondary | ICD-10-CM | POA: Insufficient documentation

## 2024-02-17 DIAGNOSIS — R112 Nausea with vomiting, unspecified: Secondary | ICD-10-CM | POA: Insufficient documentation

## 2024-02-17 DIAGNOSIS — D72829 Elevated white blood cell count, unspecified: Secondary | ICD-10-CM | POA: Diagnosis not present

## 2024-02-17 LAB — CBC WITH DIFFERENTIAL/PLATELET
Abs Immature Granulocytes: 0.06 K/uL (ref 0.00–0.07)
Basophils Absolute: 0.1 K/uL (ref 0.0–0.1)
Basophils Relative: 0 %
Eosinophils Absolute: 0.2 K/uL (ref 0.0–0.5)
Eosinophils Relative: 1 %
HCT: 40.6 % (ref 36.0–46.0)
Hemoglobin: 13.5 g/dL (ref 12.0–15.0)
Immature Granulocytes: 0 %
Lymphocytes Relative: 21 %
Lymphs Abs: 3.6 K/uL (ref 0.7–4.0)
MCH: 28 pg (ref 26.0–34.0)
MCHC: 33.3 g/dL (ref 30.0–36.0)
MCV: 84.2 fL (ref 80.0–100.0)
Monocytes Absolute: 1.4 K/uL — ABNORMAL HIGH (ref 0.1–1.0)
Monocytes Relative: 8 %
Neutro Abs: 11.5 K/uL — ABNORMAL HIGH (ref 1.7–7.7)
Neutrophils Relative %: 70 %
Platelets: 385 K/uL (ref 150–400)
RBC: 4.82 MIL/uL (ref 3.87–5.11)
RDW: 13.8 % (ref 11.5–15.5)
WBC: 16.7 K/uL — ABNORMAL HIGH (ref 4.0–10.5)
nRBC: 0 % (ref 0.0–0.2)

## 2024-02-17 LAB — LIPASE, BLOOD: Lipase: 15 U/L (ref 11–51)

## 2024-02-17 LAB — URINALYSIS, MICROSCOPIC (REFLEX)

## 2024-02-17 LAB — URINALYSIS, ROUTINE W REFLEX MICROSCOPIC
Bilirubin Urine: NEGATIVE
Glucose, UA: NEGATIVE mg/dL
Ketones, ur: 40 mg/dL — AB
Leukocytes,Ua: NEGATIVE
Nitrite: NEGATIVE
Protein, ur: NEGATIVE mg/dL
Specific Gravity, Urine: 1.025 (ref 1.005–1.030)
pH: 7 (ref 5.0–8.0)

## 2024-02-17 LAB — COMPREHENSIVE METABOLIC PANEL WITH GFR
ALT: 10 U/L (ref 0–44)
AST: 15 U/L (ref 15–41)
Albumin: 4.7 g/dL (ref 3.5–5.0)
Alkaline Phosphatase: 76 U/L (ref 38–126)
Anion gap: 16 — ABNORMAL HIGH (ref 5–15)
BUN: 7 mg/dL (ref 6–20)
CO2: 18 mmol/L — ABNORMAL LOW (ref 22–32)
Calcium: 9.4 mg/dL (ref 8.9–10.3)
Chloride: 105 mmol/L (ref 98–111)
Creatinine, Ser: 0.68 mg/dL (ref 0.44–1.00)
GFR, Estimated: >60 mL/min (ref >60)
Glucose, Bld: 124 mg/dL — ABNORMAL HIGH (ref 70–99)
Potassium: 3.7 mmol/L (ref 3.5–5.1)
Sodium: 139 mmol/L (ref 135–145)
Total Bilirubin: 0.5 mg/dL (ref 0.0–1.2)
Total Protein: 7.6 g/dL (ref 6.5–8.1)

## 2024-02-17 MED ORDER — OXYCODONE HCL 5 MG PO TABS
10.0000 mg | ORAL_TABLET | Freq: Once | ORAL | Status: AC
  Administered 2024-02-17: 10 mg via ORAL
  Filled 2024-02-17: qty 2

## 2024-02-17 MED ORDER — PENICILLIN V POTASSIUM 500 MG PO TABS
500.0000 mg | ORAL_TABLET | Freq: Four times a day (QID) | ORAL | 0 refills | Status: AC
  Filled 2024-02-17: qty 20, 5d supply, fill #0

## 2024-02-17 MED ORDER — ONDANSETRON HCL 4 MG/2ML IJ SOLN
4.0000 mg | Freq: Once | INTRAMUSCULAR | Status: AC
  Administered 2024-02-17: 4 mg via INTRAVENOUS
  Filled 2024-02-17: qty 2

## 2024-02-17 MED ORDER — CELECOXIB 200 MG PO CAPS
200.0000 mg | ORAL_CAPSULE | Freq: Two times a day (BID) | ORAL | 0 refills | Status: DC
  Filled 2024-02-17: qty 20, 10d supply, fill #0

## 2024-02-17 MED ORDER — ACETAMINOPHEN 500 MG PO TABS
1000.0000 mg | ORAL_TABLET | Freq: Once | ORAL | Status: AC
  Administered 2024-02-17: 1000 mg via ORAL
  Filled 2024-02-17: qty 2

## 2024-02-17 MED ORDER — PENICILLIN V POTASSIUM 250 MG PO TABS
500.0000 mg | ORAL_TABLET | Freq: Once | ORAL | Status: AC
  Administered 2024-02-17: 500 mg via ORAL
  Filled 2024-02-17: qty 2

## 2024-02-17 MED ORDER — KETOROLAC TROMETHAMINE 15 MG/ML IJ SOLN
30.0000 mg | Freq: Once | INTRAMUSCULAR | Status: AC
  Administered 2024-02-17: 30 mg via INTRAMUSCULAR
  Filled 2024-02-17: qty 2

## 2024-02-17 MED ORDER — ONDANSETRON HCL 4 MG PO TABS: 4.0000 mg | ORAL_TABLET | Freq: Four times a day (QID) | ORAL | 0 refills | Status: AC

## 2024-02-17 MED ORDER — SODIUM CHLORIDE 0.9 % IV BOLUS
1000.0000 mL | Freq: Once | INTRAVENOUS | Status: AC
  Administered 2024-02-17: 1000 mL via INTRAVENOUS

## 2024-02-17 MED ORDER — HALOPERIDOL LACTATE 5 MG/ML IJ SOLN
5.0000 mg | Freq: Once | INTRAMUSCULAR | Status: AC
Start: 2024-02-17 — End: 2024-02-17
  Administered 2024-02-17: 5 mg via INTRAVENOUS
  Filled 2024-02-17: qty 1

## 2024-02-17 MED ORDER — OXYCODONE HCL 5 MG PO TABS: 5.0000 mg | ORAL_TABLET | Freq: Once | ORAL | Status: DC

## 2024-02-17 NOTE — ED Notes (Signed)
 Pt states she is unable to obtain urine sample for UA

## 2024-02-17 NOTE — Discharge Instructions (Addendum)
 Please stop using marijuana as it is negatively impacting your health

## 2024-02-17 NOTE — ED Notes (Signed)
 Pt continuing to refuse vital monitoring. EDP notified

## 2024-02-17 NOTE — ED Triage Notes (Addendum)
 Pt is here for evaluation of nausea and vomiting which began this am.  Pt is vomiting clear liquid (about 300cc) during triage.  Pt is having abdominal pain. Pt with hx of Cannaboid hyperemesis, last THC use was yesterday.

## 2024-02-17 NOTE — ED Triage Notes (Signed)
 PT reports broken tooth and no dental appt until next week.  Had one today she states that got canceled.  Pt tearful at time of triage.

## 2024-02-17 NOTE — ED Provider Notes (Addendum)
 [place] [facility] Provider Note   CSN: [id number] Arrival date & time: 02/17/24  0028     History Chief Complaint  Patient presents with   Dental Pain    HPI Christine Trujillo is a 28 y.o. female presenting for chief complaint of dental pain. Has been taking tylenol  and advil . S/P oophrectomy Fractured left tooth.  Patient's recorded medical, surgical, social, medication list and allergies were reviewed in the Snapshot window as part of the initial history.   Review of Systems   Review of Systems  Constitutional:  Negative for chills and fever.  HENT:  Positive for dental problem. Negative for ear pain and sore throat.   Eyes:  Negative for pain and visual disturbance.  Respiratory:  Negative for cough and shortness of breath.   Cardiovascular:  Negative for chest pain and palpitations.  Gastrointestinal:  Negative for abdominal pain and vomiting.  Genitourinary:  Negative for dysuria and hematuria.  Musculoskeletal:  Negative for arthralgias and back pain.  Skin:  Negative for color change and rash.  Neurological:  Negative for seizures and syncope.  All other systems reviewed and are negative.   Physical Exam Updated Vital Signs BP (!) 188/100 (BP Location: Right Arm)   Pulse 80   Temp 98 F (36.7 C) (Oral)   Resp 18   LMP 02/09/2024 (Exact Date)   SpO2 100%  Physical Exam Constitutional:      General: She is not in acute distress.    Appearance: She is not ill-appearing or toxic-appearing.  HENT:     Head: Normocephalic and atraumatic.     Comments: Diffusely poor dentition.  Multiple fractured posterior molars.  Advanced dental caries diffusely. Eyes:     Extraocular Movements: Extraocular movements intact.     Pupils: Pupils are equal, round, and reactive to light.  Cardiovascular:     Rate and Rhythm: Normal rate.  Pulmonary:     Effort: No respiratory distress.  Abdominal:     General: Abdomen is flat.   Musculoskeletal:        General: No swelling, deformity or signs of injury.     Cervical back: Normal range of motion. No rigidity.  Skin:    General: Skin is warm and dry.  Neurological:     General: No focal deficit present.     Mental Status: She is alert and oriented to person, place, and time.  Psychiatric:        Mood and Affect: Mood normal.      ED Course/ Medical Decision Making/ A&P    Procedures Procedures   Medications Ordered in ED Medications  acetaminophen  (TYLENOL ) tablet 1,000 mg (1,000 mg Oral Given 02/17/24 0100)  ketorolac  (TORADOL ) 15 MG/ML injection 30 mg (30 mg Intramuscular Given 02/17/24 0100)  penicillin v potassium (VEETID) tablet 500 mg (500 mg Oral Given 02/17/24 0100)  oxyCODONE  (Oxy IR/ROXICODONE ) immediate release tablet 10 mg (10 mg Oral Given 02/17/24 0100)    Medical Decision Making:   Christine Trujillo is a 28 y.o. female who presented to the ED today with dental pain detailed above.    Complete initial physical exam performed, notably the patient was HDS in no acute distress. No obvious intraoral lesions or swelling. Poor dentition diffusely    Reviewed and confirmed nursing documentation for past medical history, family history, social history.    Initial Assessment:   With the patient's presentation of dental pain, most likely diagnosis is reversible vs irreversible pulpitis. Other diagnoses  were considered including (but not limited to) ludwig's angina, osteitis, dental abscess, PTA, RPA. These are considered less likely due to history of present illness and physical exam findings.   This is most consistent with an acute complicated illness.  Initial Plan:  Symptomatic management with NSAIDS/Tylenol  Due to clinical overlap with potentially reversible pulpitis, antibiotics prescribed Patient will need definitive management with dentistry. Provided low cost/local dental resource chart provided by social work.  Disposition:  I have  considered need for hospitalization, however, considering all of the above, I believe this patient is stable for discharge at this time.  Patient/family educated about specific return precautions for given chief complaint and symptoms.  Patient/family educated about follow-up with PCP and dentistry.    Patient/family expressed understanding of return precautions and need for follow-up. Patient spoken to regarding all imaging and laboratory results and appropriate follow up for these results. All education provided in verbal form with additional information in written form. Time was allowed for answering of patient questions. Patient discharged.      Clinical Impression:  1. Pulpitis      Discharge   Final Clinical Impression(s) / ED Diagnoses Final diagnoses:  Pulpitis    Rx / DC Orders ED Discharge Orders          Ordered    celecoxib (CELEBREX) 200 MG capsule  2 times daily        02/17/24 0049    penicillin v potassium (VEETID) 500 MG tablet  4 times daily        02/17/24 0049              Jerral Meth, MD 02/17/24 0105    Jerral Meth, MD 02/17/24 0106

## 2024-02-17 NOTE — ED Provider Notes (Signed)
 [place] [facility] Provider Note   CSN: [id number] Arrival date & time: 02/17/24  1111     Patient presents with: Emesis   Christine Trujillo is a 28 y.o. female.   The history is provided by the patient and medical records. No language interpreter was used.  Emesis     28 year old female with significant history of cannabinoid hyperemesis syndrome, obesity, GERD, anxiety, ovarian cyst presenting with complaints of nausea vomiting.  Patient endorsed persistent nausea and vomiting since this morning.  She also having some loose stools as well.  She cannot stop vomiting.  She feels dehydrated.  She does have abdominal cramping.  No fever or chills.  She was seen last night for dental infection and was prescribed antibiotics.  She also admits to marijuana use last use was this morning.  She has history of CHS.   Prior to Admission medications   Medication Sig Start Date End Date Taking? Authorizing Provider  amitriptyline (ELAVIL) 10 MG tablet Take 1 tablet (10 mg total) by mouth at bedtime. 01/23/24   Frann Mabel Mt, DO  amLODipine  (NORVASC ) 5 MG tablet Take 1 tablet (5 mg total) by mouth daily. 01/07/24   Barbarann Nest, MD  capsaicin  (ZOSTRIX) 0.025 % cream Apply topically 2 (two) times daily. 01/06/24   Barbarann Nest, MD  celecoxib (CELEBREX) 200 MG capsule Take 1 capsule (200 mg total) by mouth 2 (two) times daily. 02/17/24   Jerral Meth, MD  famotidine  (PEPCID ) 20 MG tablet Take 1 tablet (20 mg total) by mouth 2 (two) times daily. 07/11/23   Frann Mabel Mt, DO  hydrOXYzine  (ATARAX ) 25 MG tablet Take 1 tablet (25 mg total) by mouth 3 (three) times daily as needed for anxiety (refractory n/v). 01/23/24   Frann Mabel Mt, DO  metoCLOPramide  (REGLAN ) 10 MG tablet Take 0.5 tablets (5 mg total) by mouth 3 (three) times daily. 01/06/24 01/05/25  Barbarann Nest, MD  Multiple Vitamins-Minerals (MULTIVITAMIN WITH MINERALS)  tablet Take 1 tablet by mouth daily.    [provider]  ondansetron  (ZOFRAN -ODT) 4 MG disintegrating tablet Take 1 tablet (4 mg total) by mouth every 8 (eight) hours as needed for nausea or vomiting. Patient taking differently: Take 4 mg by mouth every 8 (eight) hours as needed for nausea or vomiting (dissolve orally). 10/08/23   Roselyn Carlin NOVAK, MD  pantoprazole  (PROTONIX ) 40 MG tablet Take 1 tablet (40 mg total) by mouth daily. 10/12/23   Rai, Nydia POUR, MD  penicillin v potassium (VEETID) 500 MG tablet Take 1 tablet (500 mg total) by mouth 4 (four) times daily for 5 days. 02/17/24 02/22/24  Jerral Meth, MD  promethazine  (PHENERGAN ) 25 MG suppository Place 1 suppository (25 mg total) rectally every 6 (six) hours as needed for refractory nausea / vomiting. 01/06/24   Barbarann Nest, MD  Vitamin D, Ergocalciferol, (DRISDOL) 1.25 MG (50000 UNIT) CAPS capsule Take 1 capsule (50,000 Units total) by mouth every 7 (seven) days. 01/24/24   Frann Mabel Mt, DO    Allergies: Patient has no known allergies.    Review of Systems  Gastrointestinal:  Positive for vomiting.  All other systems reviewed and are negative.   Updated Vital Signs BP (!) 173/96   Pulse 74   Temp 97.7 F (36.5 C) (Oral)   Resp 20   LMP 02/09/2024 (Exact Date)   SpO2 95%   Physical Exam Vitals and nursing note reviewed.  Constitutional:      General: She is not  in acute distress.    Appearance: She is well-developed. She is obese.     Comments: Appears uncomfortable, actively vomiting  HENT:     Head: Atraumatic.  Eyes:     Conjunctiva/sclera: Conjunctivae normal.  Cardiovascular:     Rate and Rhythm: Normal rate and regular rhythm.     Pulses: Normal pulses.     Heart sounds: Normal heart sounds.  Pulmonary:     Effort: Pulmonary effort is normal.  Abdominal:     Palpations: Abdomen is soft.     Tenderness: There is no abdominal tenderness.  Musculoskeletal:     Cervical back: Neck  supple.  Skin:    Findings: No rash.  Neurological:     Mental Status: She is alert.  Psychiatric:        Mood and Affect: Mood normal.     (all labs ordered are listed, but only abnormal results are displayed) Labs Reviewed  CBC WITH DIFFERENTIAL/PLATELET - Abnormal; Notable for the following components:      Result Value   WBC 16.7 (*)    Neutro Abs 11.5 (*)    Monocytes Absolute 1.4 (*)    All other components within normal limits  COMPREHENSIVE METABOLIC PANEL WITH GFR - Abnormal; Notable for the following components:   CO2 18 (*)    Glucose, Bld 124 (*)    Anion gap 16 (*)    All other components within normal limits  URINALYSIS, ROUTINE W REFLEX MICROSCOPIC - Abnormal; Notable for the following components:   Hgb urine dipstick LARGE (*)    Ketones, ur 40 (*)    All other components within normal limits  URINALYSIS, MICROSCOPIC (REFLEX) - Abnormal; Notable for the following components:   Bacteria, UA FEW (*)    All other components within normal limits  LIPASE, BLOOD    EKG: None  Date: 02/17/2024  Rate: 56  Rhythm: normal sinus rhythm  QRS Axis: normal  Intervals: normal  ST/T Wave abnormalities: normal  Conduction Disutrbances: none  Narrative Interpretation:   Old EKG Reviewed: No significant changes noted    Radiology: No results found.   Procedures   Medications Ordered in the ED  ondansetron  (ZOFRAN ) injection 4 mg (4 mg Intravenous Given 02/17/24 1134)  sodium chloride  0.9 % bolus 1,000 mL (0 mLs Intravenous Stopped 02/17/24 1329)  haloperidol  lactate (HALDOL ) injection 5 mg (5 mg Intravenous Given 02/17/24 1224)                                    Medical Decision Making Amount and/or Complexity of Data Reviewed Labs: ordered. ECG/medicine tests: ordered.  Risk Prescription drug management.   BP (!) 173/96   Pulse 74   Temp 97.7 F (36.5 C) (Oral)   Resp 20   LMP 02/09/2024 (Exact Date)   SpO2 95%   102:7 AM 28 year old female  with significant history of cannabinoid hyperemesis syndrome, obesity, GERD, anxiety, ovarian cyst presenting with complaints of nausea vomiting.  Patient endorsed persistent nausea and vomiting since this morning.  She also having some loose stools as well.  She cannot stop vomiting.  She feels dehydrated.  She does have abdominal cramping.  No fever or chills.  She was seen last night for dental infection and was prescribed antibiotics.  She also admits to marijuana use last use was this morning.  She has history of CHS.  On exam patient is laying in  the prone position, holding emesis bag, actively vomiting, and appears uncomfortable.  She has a soft and benign abdominal exam.  Lungs are clear.  Symptom has highly suggestive of CHS.  Will provide supportive care.  -Labs ordered, independently viewed and interpreted by me.  Labs remarkable for WBC 16.7 likely stress demargination. Anion gap 16 likely dehydration -The patient was maintained on a cardiac monitor.  I personally viewed and interpreted the cardiac monitored which showed an underlying rhythm of: nsr -Imaging including abdominal pelvis CT scan considered but not performed as patient symptoms suggestive of CHS and she has a fairly benign abdominal exam. -This patient presents to the ED for concern of nausea vomiting, this involves an extensive number of treatment options, and is a complaint that carries with it a high risk of complications and morbidity.  The differential diagnosis includes CHS, gastroenteritis, colitis, diverticulitis, gastritis, cholecystitis, appendicitis, pancreatitis -Co morbidities that complicate the patient evaluation includes marijuana abuse, GERD -Treatment includes IV fluid, Zofran , Haldol  -Reevaluation of the patient after these medicines showed that the patient improved -PCP office notes or outside notes reviewed -Escalation to admission/observation considered: patients feels much better, is comfortable with  discharge, and will follow up with PCP -Prescription medication considered, patient comfortable with Zofran  as needed for nausea.  Recommend marijuana cessation -Social Determinant of Health considered which includes marijuana use, tobacco use      Final diagnoses:  Uncontrollable vomiting    ED Discharge Orders          Ordered    ondansetron  (ZOFRAN ) 4 MG tablet  Every 6 hours        02/17/24 1440               Nivia Colon, PA-C 02/17/24 1441    Tegeler, Lonni PARAS, MD 02/17/24 [phone]

## 2024-02-17 NOTE — ED Notes (Signed)
 Pt assisted to [facility]

## 2024-02-17 NOTE — ED Notes (Signed)
 Pt refused to keep vital monitoring on. Pt expressed understanding of risks of inability to monitor vitals

## 2024-02-17 NOTE — ED Notes (Signed)
 ED Provider at bedside.

## 2024-02-19 ENCOUNTER — Encounter (HOSPITAL_BASED_OUTPATIENT_CLINIC_OR_DEPARTMENT_OTHER): Payer: Self-pay

## 2024-02-19 ENCOUNTER — Other Ambulatory Visit: Payer: Self-pay

## 2024-02-19 ENCOUNTER — Encounter (HOSPITAL_BASED_OUTPATIENT_CLINIC_OR_DEPARTMENT_OTHER): Payer: Self-pay | Admitting: Emergency Medicine

## 2024-02-19 ENCOUNTER — Emergency Department (HOSPITAL_BASED_OUTPATIENT_CLINIC_OR_DEPARTMENT_OTHER)
Admission: EM | Admit: 2024-02-19 | Discharge: 2024-02-19 | Disposition: A | Discharge status: Home / Self Care | Attending: Emergency Medicine | Admitting: Emergency Medicine

## 2024-02-19 DIAGNOSIS — R7309 Other abnormal glucose: Secondary | ICD-10-CM | POA: Insufficient documentation

## 2024-02-19 DIAGNOSIS — K0889 Other specified disorders of teeth and supporting structures: Secondary | ICD-10-CM | POA: Insufficient documentation

## 2024-02-19 DIAGNOSIS — R112 Nausea with vomiting, unspecified: Secondary | ICD-10-CM | POA: Insufficient documentation

## 2024-02-19 DIAGNOSIS — E876 Hypokalemia: Secondary | ICD-10-CM | POA: Diagnosis not present

## 2024-02-19 DIAGNOSIS — R739 Hyperglycemia, unspecified: Secondary | ICD-10-CM

## 2024-02-19 LAB — CBC WITH DIFFERENTIAL/PLATELET
Abs Immature Granulocytes: 0.03 K/uL (ref 0.00–0.07)
Basophils Absolute: 0 K/uL (ref 0.0–0.1)
Basophils Relative: 0 %
Eosinophils Absolute: 0.2 K/uL (ref 0.0–0.5)
Eosinophils Relative: 2 %
HCT: 38.7 % (ref 36.0–46.0)
Hemoglobin: 13 g/dL (ref 12.0–15.0)
Immature Granulocytes: 0 %
Lymphocytes Relative: 32 %
Lymphs Abs: 3.6 K/uL (ref 0.7–4.0)
MCH: 28.1 pg (ref 26.0–34.0)
MCHC: 33.6 g/dL (ref 30.0–36.0)
MCV: 83.8 fL (ref 80.0–100.0)
Monocytes Absolute: 1.1 K/uL — ABNORMAL HIGH (ref 0.1–1.0)
Monocytes Relative: 10 %
Neutro Abs: 6.3 K/uL (ref 1.7–7.7)
Neutrophils Relative %: 56 %
Platelets: 364 K/uL (ref 150–400)
RBC: 4.62 MIL/uL (ref 3.87–5.11)
RDW: 13.8 % (ref 11.5–15.5)
WBC: 11.4 K/uL — ABNORMAL HIGH (ref 4.0–10.5)
nRBC: 0 % (ref 0.0–0.2)

## 2024-02-19 LAB — HCG, SERUM, QUALITATIVE: Preg, Serum: NEGATIVE

## 2024-02-19 LAB — COMPREHENSIVE METABOLIC PANEL WITH GFR
ALT: 9 U/L (ref 0–44)
AST: 13 U/L — ABNORMAL LOW (ref 15–41)
Albumin: 4.4 g/dL (ref 3.5–5.0)
Alkaline Phosphatase: 74 U/L (ref 38–126)
Anion gap: 17 — ABNORMAL HIGH (ref 5–15)
BUN: 10 mg/dL (ref 6–20)
CO2: 19 mmol/L — ABNORMAL LOW (ref 22–32)
Calcium: 9.2 mg/dL (ref 8.9–10.3)
Chloride: 103 mmol/L (ref 98–111)
Creatinine, Ser: 0.85 mg/dL (ref 0.44–1.00)
GFR, Estimated: >60 mL/min (ref >60)
Glucose, Bld: 115 mg/dL — ABNORMAL HIGH (ref 70–99)
Potassium: 3.2 mmol/L — ABNORMAL LOW (ref 3.5–5.1)
Sodium: 139 mmol/L (ref 135–145)
Total Bilirubin: 0.8 mg/dL (ref 0.0–1.2)
Total Protein: 7.3 g/dL (ref 6.5–8.1)

## 2024-02-19 LAB — LIPASE, BLOOD: Lipase: 24 U/L (ref 11–51)

## 2024-02-19 MED ORDER — SODIUM CHLORIDE 0.9 % IV BOLUS
1000.0000 mL | Freq: Once | INTRAVENOUS | Status: AC
  Administered 2024-02-19: 1000 mL via INTRAVENOUS

## 2024-02-19 MED ORDER — PROCHLORPERAZINE MALEATE 10 MG PO TABS: 10.0000 mg | ORAL_TABLET | Freq: Two times a day (BID) | ORAL | 0 refills | Status: AC | PRN

## 2024-02-19 MED ORDER — ONDANSETRON HCL 4 MG/2ML IJ SOLN
4.0000 mg | Freq: Once | INTRAMUSCULAR | Status: AC
  Administered 2024-02-19: 4 mg via INTRAVENOUS
  Filled 2024-02-19: qty 2

## 2024-02-19 MED ORDER — HALOPERIDOL LACTATE 5 MG/ML IJ SOLN
5.0000 mg | Freq: Once | INTRAMUSCULAR | Status: AC
  Administered 2024-02-19: 5 mg via INTRAVENOUS
  Filled 2024-02-19: qty 1

## 2024-02-19 NOTE — Discharge Instructions (Addendum)
 Do not use any marijuana products, including edibles.

## 2024-02-19 NOTE — ED Triage Notes (Signed)
 Pt reports L upper dental pain x 2 weeks. Pt states that that tooth is broken off and that she has a dentist appt at 8 tomorrow am.

## 2024-02-19 NOTE — ED Notes (Signed)
 Reviewed discharge paperwork and meds. Pt ambulated at discharge+

## 2024-02-19 NOTE — ED Notes (Signed)
 Feels better wants to be discharged.

## 2024-02-19 NOTE — ED Provider Notes (Signed)
 [facility] Provider Note   CSN: [id number] Arrival date & time: 02/19/24  0542     Patient presents with: Emesis   Christine Trujillo is a 28 y.o. female.   The history is provided by the patient.  Emesis  She has history of cannabinoid hyperemesis syndrome, cyclic vomiting syndrome and comes in with recurrent vomiting.  She was seen in the emergency department 2 days ago for vomiting, and felt much better on discharge and yesterday but resumed vomiting today.  She denies fever or chills.  She does admit to marijuana use yesterday.    Prior to Admission medications   Medication Sig Start Date End Date Taking? Authorizing Provider  amitriptyline (ELAVIL) 10 MG tablet Take 1 tablet (10 mg total) by mouth at bedtime. 01/23/24   Frann Mabel Mt, DO  amLODipine  (NORVASC ) 5 MG tablet Take 1 tablet (5 mg total) by mouth daily. 01/07/24   Barbarann Nest, MD  capsaicin  (ZOSTRIX) 0.025 % cream Apply topically 2 (two) times daily. 01/06/24   Barbarann Nest, MD  celecoxib (CELEBREX) 200 MG capsule Take 1 capsule (200 mg total) by mouth 2 (two) times daily. 02/17/24   Jerral Meth, MD  famotidine  (PEPCID ) 20 MG tablet Take 1 tablet (20 mg total) by mouth 2 (two) times daily. 07/11/23   Frann Mabel Mt, DO  hydrOXYzine  (ATARAX ) 25 MG tablet Take 1 tablet (25 mg total) by mouth 3 (three) times daily as needed for anxiety (refractory n/v). 01/23/24   Frann Mabel Mt, DO  metoCLOPramide  (REGLAN ) 10 MG tablet Take 0.5 tablets (5 mg total) by mouth 3 (three) times daily. 01/06/24 01/05/25  Barbarann Nest, MD  Multiple Vitamins-Minerals (MULTIVITAMIN WITH MINERALS) tablet Take 1 tablet by mouth daily.    [provider]  ondansetron  (ZOFRAN ) 4 MG tablet Take 1 tablet (4 mg total) by mouth every 6 (six) hours. 02/17/24   Nivia Colon, PA-C  ondansetron  (ZOFRAN -ODT) 4 MG disintegrating tablet Take 1 tablet (4 mg total) by mouth  every 8 (eight) hours as needed for nausea or vomiting. Patient taking differently: Take 4 mg by mouth every 8 (eight) hours as needed for nausea or vomiting (dissolve orally). 10/08/23   Roselyn Carlin NOVAK, MD  pantoprazole  (PROTONIX ) 40 MG tablet Take 1 tablet (40 mg total) by mouth daily. 10/12/23   Rai, Nydia POUR, MD  penicillin v potassium (VEETID) 500 MG tablet Take 1 tablet (500 mg total) by mouth 4 (four) times daily for 5 days. 02/17/24 02/22/24  Jerral Meth, MD  promethazine  (PHENERGAN ) 25 MG suppository Place 1 suppository (25 mg total) rectally every 6 (six) hours as needed for refractory nausea / vomiting. 01/06/24   Barbarann Nest, MD  Vitamin D, Ergocalciferol, (DRISDOL) 1.25 MG (50000 UNIT) CAPS capsule Take 1 capsule (50,000 Units total) by mouth every 7 (seven) days. 01/24/24   Frann Mabel Mt, DO    Allergies: Patient has no known allergies.    Review of Systems  Gastrointestinal:  Positive for vomiting.  All other systems reviewed and are negative.   Updated Vital Signs BP (!) 179/100 (BP Location: Right Arm)   Pulse 70   Temp 98.1 F (36.7 C) (Oral)   Resp 18   Ht 5' 3 (1.6 m)   Wt 117.9 kg   LMP 02/09/2024 (Exact Date)   SpO2 100%   BMI 46.06 kg/m   Physical Exam Vitals and nursing note reviewed.   28 year old female, actively retching, but is in  no acute distress. Vital signs are significant for elevated blood pressure. Oxygen saturation is 100%, which is normal. Head is normocephalic and atraumatic. PERRLA, EOMI.  Lungs are clear without rales, wheezes, or rhonchi. Chest is nontender. Heart has regular rate and rhythm without murmur. Abdomen is soft, flat, nontender. Skin is warm and dry without rash. Neurologic: Mental status is normal, cranial nerves are intact, moves all extremities equally.  (all labs ordered are listed, but only abnormal results are displayed) Labs Reviewed  COMPREHENSIVE METABOLIC PANEL WITH GFR - Abnormal; Notable  for the following components:      Result Value   Potassium 3.2 (*)    CO2 19 (*)    Glucose, Bld 115 (*)    AST 13 (*)    Anion gap 17 (*)    All other components within normal limits  CBC WITH DIFFERENTIAL/PLATELET - Abnormal; Notable for the following components:   WBC 11.4 (*)    Monocytes Absolute 1.1 (*)    All other components within normal limits  LIPASE, BLOOD  HCG, SERUM, QUALITATIVE     Procedures   Medications Ordered in the ED  sodium chloride  0.9 % bolus 1,000 mL (1,000 mLs Intravenous New Bag/Given 02/19/24 0617)  haloperidol  lactate (HALDOL ) injection 5 mg (5 mg Intravenous Given 02/19/24 0618)  ondansetron  (ZOFRAN ) injection 4 mg (4 mg Intravenous Given 02/19/24 0617)    Clinical Course as of 02/19/24 0707  Sun Feb 19, 2024  0659 Signout; likely cannabinoid hyperemesis; re-eval [TY]    Clinical Course User Index [TY] Neysa Caron PARAS, DO                                 Medical Decision Making Amount and/or Complexity of Data Reviewed Labs: ordered.  Risk Prescription drug management.   Nausea and vomiting in patient with history of cyclic vomiting syndrome and cannabinoid hyperemesis syndrome.  I suspect that this is from cannabis use.  I have reviewed her past records, and noticed numerous ED visits and hospitalizations for intractable vomiting.  Most recent ED visit was on 11/7, most recent hospitalization was on 9/23.  I have ordered screening labs, IV fluids, haloperidol , ondansetron .  I have reviewed her laboratory tests, and my interpretation is elevated random glucose level, low potassium likely secondary to vomiting, normal lipase, mild leukocytosis which is nonspecific, not pregnant.  She feels much better following above-noted treatment.  She is safe for discharge.    Final diagnoses:  Nausea and vomiting, unspecified vomiting type  Hypokalemia due to excessive gastrointestinal loss of potassium  Elevated random blood glucose level    ED  Discharge Orders     None          Raford Lenis, MD 02/19/24 0710

## 2024-02-19 NOTE — ED Triage Notes (Signed)
 Pt vomiting, seen for same 11/7, states sx returned this morning. Pt heaving but no vomiting during triage.

## 2024-02-20 ENCOUNTER — Emergency Department (HOSPITAL_BASED_OUTPATIENT_CLINIC_OR_DEPARTMENT_OTHER)
Admission: EM | Admit: 2024-02-20 | Discharge: 2024-02-20 | Disposition: A | Attending: Emergency Medicine | Admitting: Emergency Medicine

## 2024-02-20 DIAGNOSIS — K0889 Other specified disorders of teeth and supporting structures: Secondary | ICD-10-CM

## 2024-02-20 MED ORDER — OXYCODONE-ACETAMINOPHEN 5-325 MG PO TABS
2.0000 | ORAL_TABLET | Freq: Once | ORAL | Status: AC
  Administered 2024-02-20: 2 via ORAL
  Filled 2024-02-20: qty 2

## 2024-02-20 MED ORDER — OXYCODONE-ACETAMINOPHEN 5-325 MG PO TABS: 1.0000 | ORAL_TABLET | Freq: Four times a day (QID) | ORAL | 0 refills | Status: DC | PRN

## 2024-02-20 NOTE — ED Provider Notes (Signed)
 [facility] Provider Note   CSN: [id number] Arrival date & time: 02/19/24  2348     Patient presents with: Dental Pain   Christine Trujillo is a 28 y.o. female.   Patient is a 28 year old female with past medical history of anxiety, depression, hyperemesis.  Patient presenting today with complaints of dental pain.  She describes severe pain to her left rear upper molar.  It is cracked off at the gumline and been causing her significant discomfort.  She has been seen here on multiple occasions with similar complaints.  She is currently taking penicillin along with ibuprofen  and Goody powders, but this does not seem to be helping at all.       Prior to Admission medications   Medication Sig Start Date End Date Taking? Authorizing Provider  amitriptyline (ELAVIL) 10 MG tablet Take 1 tablet (10 mg total) by mouth at bedtime. 01/23/24   Frann Mabel Mt, DO  amLODipine  (NORVASC ) 5 MG tablet Take 1 tablet (5 mg total) by mouth daily. 01/07/24   Barbarann Nest, MD  capsaicin  (ZOSTRIX) 0.025 % cream Apply topically 2 (two) times daily. 01/06/24   Barbarann Nest, MD  celecoxib (CELEBREX) 200 MG capsule Take 1 capsule (200 mg total) by mouth 2 (two) times daily. 02/17/24   Jerral Meth, MD  famotidine  (PEPCID ) 20 MG tablet Take 1 tablet (20 mg total) by mouth 2 (two) times daily. 07/11/23   Frann Mabel Mt, DO  hydrOXYzine  (ATARAX ) 25 MG tablet Take 1 tablet (25 mg total) by mouth 3 (three) times daily as needed for anxiety (refractory n/v). 01/23/24   Frann Mabel Mt, DO  metoCLOPramide  (REGLAN ) 10 MG tablet Take 0.5 tablets (5 mg total) by mouth 3 (three) times daily. 01/06/24 01/05/25  Barbarann Nest, MD  Multiple Vitamins-Minerals (MULTIVITAMIN WITH MINERALS) tablet Take 1 tablet by mouth daily.    [provider]  ondansetron  (ZOFRAN ) 4 MG tablet Take 1 tablet (4 mg total) by mouth every 6 (six) hours. 02/17/24    Nivia Colon, PA-C  ondansetron  (ZOFRAN -ODT) 4 MG disintegrating tablet Take 1 tablet (4 mg total) by mouth every 8 (eight) hours as needed for nausea or vomiting. Patient taking differently: Take 4 mg by mouth every 8 (eight) hours as needed for nausea or vomiting (dissolve orally). 10/08/23   Roselyn Carlin NOVAK, MD  pantoprazole  (PROTONIX ) 40 MG tablet Take 1 tablet (40 mg total) by mouth daily. 10/12/23   Rai, Nydia POUR, MD  penicillin v potassium (VEETID) 500 MG tablet Take 1 tablet (500 mg total) by mouth 4 (four) times daily for 5 days. 02/17/24 02/22/24  Jerral Meth, MD  prochlorperazine  (COMPAZINE ) 10 MG tablet Take 1 tablet (10 mg total) by mouth 2 (two) times daily as needed for nausea or vomiting. 02/19/24   Neysa Caron PARAS, DO  promethazine  (PHENERGAN ) 25 MG suppository Place 1 suppository (25 mg total) rectally every 6 (six) hours as needed for refractory nausea / vomiting. 01/06/24   Barbarann Nest, MD  Vitamin D, Ergocalciferol, (DRISDOL) 1.25 MG (50000 UNIT) CAPS capsule Take 1 capsule (50,000 Units total) by mouth every 7 (seven) days. 01/24/24   Frann Mabel Mt, DO    Allergies: Patient has no known allergies.    Review of Systems  All other systems reviewed and are negative.   Updated Vital Signs BP (!) 172/106   Pulse 69   Temp 97.8 F (36.6 C)   Resp 17   Ht 5' 3 (1.6 m)  Wt 117.9 kg   LMP 02/09/2024 (Exact Date)   SpO2 96%   BMI 46.06 kg/m   Physical Exam Vitals and nursing note reviewed.  Constitutional:      Appearance: Normal appearance.  HENT:     Mouth/Throat:     Comments: To the left upper rear molar, there is a fractured remnant of a tooth with some surrounding gingival inflammation, but no obvious abscess. Pulmonary:     Effort: Pulmonary effort is normal.  Musculoskeletal:     Cervical back: Neck supple. No rigidity or tenderness.  Skin:    General: Skin is warm and dry.  Neurological:     Mental Status: She is alert and oriented  to person, place, and time.     (all labs ordered are listed, but only abnormal results are displayed) Labs Reviewed - No data to display  EKG: None  Radiology: No results found.   Procedures   Medications Ordered in the ED  oxyCODONE -acetaminophen  (PERCOCET/ROXICET) 5-325 MG per tablet 2 tablet (has no administration in time range)                                    Medical Decision Making Risk Prescription drug management.   Patient presenting with dental pain as described in the HPI.  I will prescribe a small quantity of pain medication until she sees her dentist tomorrow morning.  This is scheduled for 8 AM     Final diagnoses:  None    ED Discharge Orders     None          Geroldine Berg, MD 02/20/24 0028

## 2024-02-20 NOTE — Discharge Instructions (Addendum)
 Begin taking Percocet as prescribed.  Continue taking penicillin as previously prescribed.  See your dentist this morning as previously scheduled.

## 2024-02-21 ENCOUNTER — Telehealth: Payer: Self-pay

## 2024-02-21 DIAGNOSIS — Z111 Encounter for screening for respiratory tuberculosis: Secondary | ICD-10-CM

## 2024-02-21 NOTE — Telephone Encounter (Signed)
 OK to offer TST. If she is requesting something else, I am not aware of it and would question whether our office provides it. Thx.

## 2024-02-21 NOTE — Telephone Encounter (Signed)
 Copied from CRM [id number]. Topic: Clinical - Request for Lab/Test Order >> Feb 21, 2024  3:22 PM Christine Trujillo ORN wrote: Reason for CRM: pt requesting tb shot . Please contact pt to confirm

## 2024-02-22 ENCOUNTER — Other Ambulatory Visit

## 2024-02-22 NOTE — Telephone Encounter (Signed)
 Called pt and sent message to schedule lab appt for TB blood test.

## 2024-02-22 NOTE — Telephone Encounter (Signed)
 Pt scheduled for tb gold    Copied from CRM #[id number]. Topic: Clinical - Request for Lab/Test Order >> Feb 22, 2024 12:13 PM Ashley R wrote: Reason for CRM: TB test order needed for scheduling. Missed call earlier and unable to take call until 230. Will call back to schedule.    ----------------------------------------------------------------------- From previous Reason for Contact - Call Back - No Documentation: Reason for CRM:

## 2024-02-23 ENCOUNTER — Other Ambulatory Visit

## 2024-02-24 ENCOUNTER — Ambulatory Visit: Payer: Self-pay | Admitting: *Deleted

## 2024-02-24 ENCOUNTER — Emergency Department (HOSPITAL_BASED_OUTPATIENT_CLINIC_OR_DEPARTMENT_OTHER): Admission: EM | Admit: 2024-02-24 | Discharge: 2024-02-24 | Disposition: A | Discharge status: Home / Self Care

## 2024-02-24 ENCOUNTER — Other Ambulatory Visit (INDEPENDENT_AMBULATORY_CARE_PROVIDER_SITE_OTHER)

## 2024-02-24 ENCOUNTER — Other Ambulatory Visit (HOSPITAL_BASED_OUTPATIENT_CLINIC_OR_DEPARTMENT_OTHER): Payer: Self-pay

## 2024-02-24 ENCOUNTER — Other Ambulatory Visit: Payer: Self-pay

## 2024-02-24 ENCOUNTER — Encounter (HOSPITAL_BASED_OUTPATIENT_CLINIC_OR_DEPARTMENT_OTHER): Payer: Self-pay | Admitting: Emergency Medicine

## 2024-02-24 ENCOUNTER — Ambulatory Visit: Payer: Self-pay | Admitting: Family Medicine

## 2024-02-24 DIAGNOSIS — K029 Dental caries, unspecified: Secondary | ICD-10-CM | POA: Insufficient documentation

## 2024-02-24 DIAGNOSIS — K0889 Other specified disorders of teeth and supporting structures: Secondary | ICD-10-CM | POA: Diagnosis not present

## 2024-02-24 DIAGNOSIS — E559 Vitamin D deficiency, unspecified: Secondary | ICD-10-CM

## 2024-02-24 DIAGNOSIS — Z111 Encounter for screening for respiratory tuberculosis: Secondary | ICD-10-CM

## 2024-02-24 LAB — VITAMIN D 25 HYDROXY (VIT D DEFICIENCY, FRACTURES): VITD: 17.47 ng/mL — ABNORMAL LOW (ref 30.00–100.00)

## 2024-02-24 MED ORDER — OXYCODONE HCL 5 MG PO TABS
10.0000 mg | ORAL_TABLET | Freq: Once | ORAL | Status: AC
  Administered 2024-02-24: 10 mg via ORAL
  Filled 2024-02-24: qty 2

## 2024-02-24 MED ORDER — KETOROLAC TROMETHAMINE 15 MG/ML IJ SOLN
15.0000 mg | Freq: Once | INTRAMUSCULAR | Status: AC
  Administered 2024-02-24: 15 mg via INTRAMUSCULAR
  Filled 2024-02-24: qty 1

## 2024-02-24 MED ORDER — IBUPROFEN 800 MG PO TABS: 800.0000 mg | ORAL_TABLET | Freq: Three times a day (TID) | ORAL | 0 refills | Status: DC

## 2024-02-24 NOTE — Telephone Encounter (Signed)
 Recommended UC /ED no available appt with PCP next week for hospital f/u that patient could make. Please advise.  CAL aware patient requesting appt today    FYI Only or Action Required?: FYI only for provider: ED advised and or UC.  Patient was last seen in primary care on 01/23/2024 by Frann Mabel Mt, DO.  Called Nurse Triage reporting Dental Pain.  Symptoms began a week ago.  Interventions attempted: Prescription medications: antibiotics and taking OTC dual action Tylenol  and Rest, hydration, or home remedies.  Symptoms are: rapidly worsening.  Triage Disposition: See HCP Within 4 Hours (Or PCP Triage)  Patient/caregiver understands and will follow disposition?: No, wishes to speak with PCP     Copied from CRM #[id number]. Topic: Clinical - Red Word Triage >> Feb 24, 2024  9:15 AM Christine Trujillo wrote: Red Word that prompted transfer to Nurse Triage: Severe pain to her left rear upper molar. Pain behind her eye and ear. Reason for Disposition  [1] SEVERE pain (e.g., excruciating, unable to eat, unable to do any normal activities) AND [2] not improved 2 hours after pain medicine  Answer Assessment - Initial Assessment Questions Recommended UC or ED for evaluation of severe pain. No available appt with PCP for hospital f/u until 02/28/24. Only time available patient unable to make has to be at 0900. Patient reports she finished antibiotics given in ED on 02/21/24 and pain not decreased using dual action Tylenol . No swelling in face but severe pain left ear and eye. Patient unable to brush teeth in left upper area due to sensitivity and pain. Can open mouth. Please advise unsure patient will go back to ED.      1. LOCATION: Which tooth is hurting?  (e.g., right-side/left-side, upper/lower, front/back)     Left upper rear molar  2. ONSET: When did the toothache start?  (e.g., hours, days)      1 week  3. SEVERITY: How bad is the toothache?  (Scale 1-10; mild, moderate or  severe)     severe 4. SWELLING: Is there any visible swelling of your face?     No swelling  5. OTHER SYMPTOMS: Do you have any other symptoms? (e.g., fever)     Pain left upper rear molar, behind ear and eye  6. PREGNANCY: Is there any chance you are pregnant? When was your last menstrual period?     na  Protocols used: Toothache-A-AH

## 2024-02-24 NOTE — ED Notes (Signed)
 Pt alert and oriented X 4 at the time of discharge. RR even and unlabored. No acute distress noted. Pt verbalized understanding of discharge instructions as discussed. Pt ambulatory to lobby at time of discharge.

## 2024-02-24 NOTE — Telephone Encounter (Signed)
 Called pt and she stated not able to come until 430 after work and she stated she would go to ER.

## 2024-02-24 NOTE — Discharge Instructions (Addendum)
 Please read and follow all provided instructions.  Your diagnoses today include:  1. Pain due to dental caries     The exam and treatment you received today has been provided on an emergency basis only. This is not a substitute for complete medical or dental care.  Tests performed today include: Vital signs. See below for your results today.   Medications prescribed:  Ibuprofen  (Motrin , Advil ) - anti-inflammatory pain medication Do not exceed 800mg  ibuprofen  every 8 hours, take with food  You have been prescribed an anti-inflammatory medication or NSAID. Take with food. Take smallest effective dose for the shortest duration needed for your pain. Stop taking if you experience stomach pain or vomiting.   Take any prescribed medications only as directed.  Home care instructions:  Follow any educational materials contained in this packet.  Follow-up instructions: Please follow-up with your dentist for further evaluation of your symptoms.   Dental Assistance: See attached dental referral and/or resource guide.   Return instructions:  Please return to the Emergency Department if you experience worsening symptoms. Please return if you develop a fever, you develop more swelling in your face or neck, you have trouble breathing or swallowing food. Please return if you have any other emergent concerns.  Additional Information:  Your vital signs today were: BP (!) 156/122   Pulse 75   Temp 98.2 F (36.8 C) (Oral)   Resp 17   Ht 5' 3 (1.6 m)   Wt 117.9 kg   LMP 02/09/2024 (Exact Date)   SpO2 100%   BMI 46.06 kg/m  If your blood pressure (BP) was elevated above 135/85 this visit, please have this repeated by your doctor within one month. --------------

## 2024-02-24 NOTE — ED Triage Notes (Signed)
 Pt reports she was seen here for dental pain Monday, had dental appt today, when she arrived to dentist system was down.  C/o ongoing dental pain  OTC meds ineffective. Denies fever. L facial swelling noted.

## 2024-02-24 NOTE — Telephone Encounter (Signed)
 OK to sched her at 4 today. She may have a slight wait.

## 2024-02-24 NOTE — ED Provider Notes (Signed)
 [place] [facility] Provider Note   CSN: [id number] Arrival date & time: 02/24/24  1156     Patient presents with: Dental Pain   Christine Trujillo is a 28 y.o. female.   Patient with history of cannabinoid hyperemesis syndrome --presents to the emergency department for ongoing dental pain.  Patient reports broken teeth, left lower jaw.  Pain radiates to the ear.  No difficulty breathing or swallowing.  Patient was seen in the emergency department on 11/10 most recently.  She was prescribed # 12 tablets of oxycodone /acetaminophen .  She is also currently on amoxicillin  per her report.  States that she has tried to go to her dentist, at each visit system was crashed and she could not be seen.  States that they rescheduled her for 03/03/2024.       Prior to Admission medications   Medication Sig Start Date End Date Taking? Authorizing Provider  ibuprofen  (ADVIL ) 800 MG tablet Take 1 tablet (800 mg total) by mouth 3 (three) times daily. 02/24/24  Yes Desiderio Chew, PA-C  amitriptyline (ELAVIL) 10 MG tablet Take 1 tablet (10 mg total) by mouth at bedtime. 01/23/24   Frann Mabel Mt, DO  amLODipine  (NORVASC ) 5 MG tablet Take 1 tablet (5 mg total) by mouth daily. 01/07/24   Barbarann Nest, MD  capsaicin  (ZOSTRIX) 0.025 % cream Apply topically 2 (two) times daily. 01/06/24   Barbarann Nest, MD  famotidine  (PEPCID ) 20 MG tablet Take 1 tablet (20 mg total) by mouth 2 (two) times daily. 07/11/23   Frann Mabel Mt, DO  hydrOXYzine  (ATARAX ) 25 MG tablet Take 1 tablet (25 mg total) by mouth 3 (three) times daily as needed for anxiety (refractory n/v). 01/23/24   Frann Mabel Mt, DO  metoCLOPramide  (REGLAN ) 10 MG tablet Take 0.5 tablets (5 mg total) by mouth 3 (three) times daily. 01/06/24 01/05/25  Barbarann Nest, MD  Multiple Vitamins-Minerals (MULTIVITAMIN WITH MINERALS) tablet Take 1 tablet by mouth daily.    [provider]   ondansetron  (ZOFRAN ) 4 MG tablet Take 1 tablet (4 mg total) by mouth every 6 (six) hours. 02/17/24   Nivia Colon, PA-C  ondansetron  (ZOFRAN -ODT) 4 MG disintegrating tablet Take 1 tablet (4 mg total) by mouth every 8 (eight) hours as needed for nausea or vomiting. Patient taking differently: Take 4 mg by mouth every 8 (eight) hours as needed for nausea or vomiting (dissolve orally). 10/08/23   Roselyn Carlin NOVAK, MD  oxyCODONE -acetaminophen  (PERCOCET) 5-325 MG tablet Take 1-2 tablets by mouth every 6 (six) hours as needed. 02/20/24   Geroldine Berg, MD  pantoprazole  (PROTONIX ) 40 MG tablet Take 1 tablet (40 mg total) by mouth daily. 10/12/23   Rai, Nydia POUR, MD  prochlorperazine  (COMPAZINE ) 10 MG tablet Take 1 tablet (10 mg total) by mouth 2 (two) times daily as needed for nausea or vomiting. 02/19/24   Neysa Caron PARAS, DO  promethazine  (PHENERGAN ) 25 MG suppository Place 1 suppository (25 mg total) rectally every 6 (six) hours as needed for refractory nausea / vomiting. 01/06/24   Barbarann Nest, MD  Vitamin D, Ergocalciferol, (DRISDOL) 1.25 MG (50000 UNIT) CAPS capsule Take 1 capsule (50,000 Units total) by mouth every 7 (seven) days. 01/24/24   Frann Mabel Mt, DO    Allergies: Patient has no known allergies.    Review of Systems  Updated Vital Signs BP (!) 156/122   Pulse 75   Temp 98.2 F (36.8 C) (Oral)   Resp 17   Ht 5'  3 (1.6 m)   Wt 117.9 kg   LMP 02/09/2024 (Exact Date)   SpO2 100%   BMI 46.06 kg/m   Physical Exam Vitals and nursing note reviewed.  Constitutional:      Appearance: She is well-developed.  HENT:     Head: Normocephalic and atraumatic.     Jaw: No trismus.     Right Ear: Tympanic membrane, ear canal and external ear normal.     Left Ear: Tympanic membrane, ear canal and external ear normal.     Nose: Nose normal.     Mouth/Throat:     Dentition: Abnormal dentition. Dental caries present. No dental abscesses.     Pharynx: Uvula midline. No uvula  swelling.     Tonsils: No tonsillar abscesses.     Comments: Patient with two broken teeth, in area of tooth #18/19.  Some gingival swelling noted.  No gross abscess.  No facial swelling. Eyes:     Conjunctiva/sclera: Conjunctivae normal.  Neck:     Comments: No neck swelling or Ludwig's angina Musculoskeletal:     Cervical back: Normal range of motion and neck supple.  Lymphadenopathy:     Cervical: No cervical adenopathy.  Skin:    General: Skin is warm and dry.  Neurological:     Mental Status: She is alert.     (all labs ordered are listed, but only abnormal results are displayed) Labs Reviewed - No data to display  EKG: None  Radiology: No results found.   Procedures   Medications Ordered in the ED  ketorolac  (TORADOL ) 15 MG/ML injection 15 mg (has no administration in time range)  oxyCODONE  (Oxy IR/ROXICODONE ) immediate release tablet 10 mg (has no administration in time range)   ED Course  Patient seen and examined. History obtained directly from patient.  Reviewed previous ED visits.  Labs/EKG: None ordered.  Imaging: None ordered.  Medications/Fluids: Ordered: IM Toradol , p.o. oxycodone  10mg   Most recent vital signs reviewed and are as follows: BP (!) 156/122   Pulse 75   Temp 98.2 F (36.8 C) (Oral)   Resp 17   Ht 5' 3 (1.6 m)   Wt 117.9 kg   LMP 02/09/2024 (Exact Date)   SpO2 100%   BMI 46.06 kg/m   Initial impression: dental pain/dental infection  Plan: Discharge to home.  Discussed with patient that I could not provide additional prescription for opioid pain medication.  She request ibuprofen  800 mg which I will prescribe.  Prescriptions written for: Ibuprofen  800 mg, take with food  Other home care instructions discussed: Avoidance of chewing or other activities that makes the symptoms worse. Eat soft foods if needed and maintain good hydration.   ED return instructions discussed: Encouraged patient to return with worsening facial or  neck swelling, difficulty breathing or swallowing, fever.   Follow-up instructions discussed: Patient encouraged to follow-up with provided dental referral, resources -- or their own dentist if able.                                   Medical Decision Making Risk Prescription drug management.   Patient presents for dental pain, uncomplicated. They do not have a fever and do not appear septic. Exam unconcerning for Ludwig's angina or other deep tissue infection in neck and I do not feel that advanced imaging is indicated at this time. Low suspicion for PTA, RPA, epiglottis based on exam.   Patient  will be treated for dental pain. Encouraged tylenol /NSAIDs as prescribed or as directed on the packaging for pain. Encouraged follow-up with a dentist for definitive and long-term management.       Final diagnoses:  Pain due to dental caries    ED Discharge Orders          Ordered    ibuprofen  (ADVIL ) 800 MG tablet  3 times daily        02/24/24 1227               Desiderio Chew, PA-C 02/24/24 1232    Kammerer, Megan L, DO 02/28/24 [phone]

## 2024-02-27 ENCOUNTER — Institutional Professional Consult (permissible substitution): Payer: Self-pay | Admitting: Neurology

## 2024-02-27 ENCOUNTER — Other Ambulatory Visit (HOSPITAL_BASED_OUTPATIENT_CLINIC_OR_DEPARTMENT_OTHER): Payer: Self-pay

## 2024-02-27 LAB — QUANTIFERON-TB GOLD PLUS
Mitogen-NIL: >10 [IU]/mL
NIL: 0.02 [IU]/mL
QuantiFERON-TB Gold Plus: NEGATIVE
TB1-NIL: 0 [IU]/mL
TB2-NIL: 0 [IU]/mL

## 2024-03-02 ENCOUNTER — Emergency Department (HOSPITAL_COMMUNITY)
Admission: EM | Admit: 2024-03-02 | Discharge: 2024-03-03 | Disposition: A | Discharge status: Home / Self Care | Attending: Emergency Medicine | Admitting: Emergency Medicine

## 2024-03-02 ENCOUNTER — Encounter (HOSPITAL_BASED_OUTPATIENT_CLINIC_OR_DEPARTMENT_OTHER): Payer: Self-pay | Admitting: Emergency Medicine

## 2024-03-02 ENCOUNTER — Emergency Department (HOSPITAL_BASED_OUTPATIENT_CLINIC_OR_DEPARTMENT_OTHER)
Admission: EM | Admit: 2024-03-02 | Discharge: 2024-03-02 | Disposition: A | Discharge status: Home / Self Care | Attending: Emergency Medicine | Admitting: Emergency Medicine

## 2024-03-02 ENCOUNTER — Other Ambulatory Visit: Payer: Self-pay

## 2024-03-02 ENCOUNTER — Encounter (HOSPITAL_COMMUNITY): Payer: Self-pay | Admitting: Emergency Medicine

## 2024-03-02 ENCOUNTER — Emergency Department (HOSPITAL_BASED_OUTPATIENT_CLINIC_OR_DEPARTMENT_OTHER)
Admission: EM | Admit: 2024-03-02 | Discharge: 2024-03-02 | Disposition: A | Discharge status: Home / Self Care | Source: Home / Self Care | Attending: Emergency Medicine | Admitting: Emergency Medicine

## 2024-03-02 DIAGNOSIS — E119 Type 2 diabetes mellitus without complications: Secondary | ICD-10-CM | POA: Insufficient documentation

## 2024-03-02 DIAGNOSIS — R1084 Generalized abdominal pain: Secondary | ICD-10-CM | POA: Insufficient documentation

## 2024-03-02 DIAGNOSIS — F17210 Nicotine dependence, cigarettes, uncomplicated: Secondary | ICD-10-CM | POA: Insufficient documentation

## 2024-03-02 DIAGNOSIS — R112 Nausea with vomiting, unspecified: Secondary | ICD-10-CM | POA: Insufficient documentation

## 2024-03-02 DIAGNOSIS — F1210 Cannabis abuse, uncomplicated: Secondary | ICD-10-CM | POA: Insufficient documentation

## 2024-03-02 DIAGNOSIS — F12188 Cannabis abuse with other cannabis-induced disorder: Secondary | ICD-10-CM | POA: Diagnosis not present

## 2024-03-02 DIAGNOSIS — R197 Diarrhea, unspecified: Secondary | ICD-10-CM | POA: Diagnosis not present

## 2024-03-02 DIAGNOSIS — R1116 Cannabis hyperemesis syndrome: Secondary | ICD-10-CM | POA: Insufficient documentation

## 2024-03-02 DIAGNOSIS — E876 Hypokalemia: Secondary | ICD-10-CM | POA: Insufficient documentation

## 2024-03-02 DIAGNOSIS — R110 Nausea: Secondary | ICD-10-CM | POA: Diagnosis not present

## 2024-03-02 DIAGNOSIS — R202 Paresthesia of skin: Secondary | ICD-10-CM | POA: Insufficient documentation

## 2024-03-02 DIAGNOSIS — R1115 Cyclical vomiting syndrome unrelated to migraine: Secondary | ICD-10-CM | POA: Diagnosis not present

## 2024-03-02 DIAGNOSIS — R1110 Vomiting, unspecified: Secondary | ICD-10-CM

## 2024-03-02 DIAGNOSIS — I509 Heart failure, unspecified: Secondary | ICD-10-CM | POA: Diagnosis not present

## 2024-03-02 DIAGNOSIS — I10 Essential (primary) hypertension: Secondary | ICD-10-CM | POA: Diagnosis not present

## 2024-03-02 LAB — COMPREHENSIVE METABOLIC PANEL WITH GFR
ALT: 13 U/L (ref 0–44)
ALT: 12 U/L (ref 0–44)
AST: 24 U/L (ref 15–41)
AST: 19 U/L (ref 15–41)
Albumin: 4.8 g/dL (ref 3.5–5.0)
Albumin: 4.9 g/dL (ref 3.5–5.0)
Alkaline Phosphatase: 76 U/L (ref 38–126)
Alkaline Phosphatase: 77 U/L (ref 38–126)
Anion gap: 17 — ABNORMAL HIGH (ref 5–15)
Anion gap: 16 — ABNORMAL HIGH (ref 5–15)
BUN: 9 mg/dL (ref 6–20)
BUN: 6 mg/dL (ref 6–20)
CO2: 18 mmol/L — ABNORMAL LOW (ref 22–32)
CO2: 20 mmol/L — ABNORMAL LOW (ref 22–32)
Calcium: 9.4 mg/dL (ref 8.9–10.3)
Calcium: 9.2 mg/dL (ref 8.9–10.3)
Chloride: 103 mmol/L (ref 98–111)
Chloride: 100 mmol/L (ref 98–111)
Creatinine, Ser: 0.77 mg/dL (ref 0.44–1.00)
Creatinine, Ser: 0.64 mg/dL (ref 0.44–1.00)
GFR, Estimated: >60 mL/min (ref >60)
GFR, Estimated: >60 mL/min (ref >60)
Glucose, Bld: 129 mg/dL — ABNORMAL HIGH (ref 70–99)
Glucose, Bld: 120 mg/dL — ABNORMAL HIGH (ref 70–99)
Potassium: 3.9 mmol/L (ref 3.5–5.1)
Potassium: 3.1 mmol/L — ABNORMAL LOW (ref 3.5–5.1)
Sodium: 138 mmol/L (ref 135–145)
Sodium: 136 mmol/L (ref 135–145)
Total Bilirubin: 0.8 mg/dL (ref 0.0–1.2)
Total Bilirubin: 0.9 mg/dL (ref 0.0–1.2)
Total Protein: 7.6 g/dL (ref 6.5–8.1)
Total Protein: 7.6 g/dL (ref 6.5–8.1)

## 2024-03-02 LAB — PREGNANCY, URINE: Preg Test, Ur: NEGATIVE

## 2024-03-02 LAB — URINALYSIS, ROUTINE W REFLEX MICROSCOPIC
Bilirubin Urine: NEGATIVE
Glucose, UA: NEGATIVE mg/dL
Ketones, ur: 80 mg/dL — AB
Leukocytes,Ua: NEGATIVE
Nitrite: NEGATIVE
Protein, ur: NEGATIVE mg/dL
Specific Gravity, Urine: 1.025 (ref 1.005–1.030)
pH: 7 (ref 5.0–8.0)

## 2024-03-02 LAB — URINE DRUG SCREEN
Amphetamines: NEGATIVE
Barbiturates: NEGATIVE
Benzodiazepines: NEGATIVE
Cocaine: NEGATIVE
Fentanyl: NEGATIVE
Methadone Scn, Ur: NEGATIVE
Opiates: NEGATIVE
Tetrahydrocannabinol: POSITIVE — AB

## 2024-03-02 LAB — CBG MONITORING, ED
Glucose-Capillary: 142 mg/dL — ABNORMAL HIGH (ref 70–99)
Glucose-Capillary: 147 mg/dL — ABNORMAL HIGH (ref 70–99)

## 2024-03-02 LAB — CBC WITH DIFFERENTIAL/PLATELET
Abs Immature Granulocytes: 0.05 K/uL (ref 0.00–0.07)
Abs Immature Granulocytes: 0.04 K/uL (ref 0.00–0.07)
Basophils Absolute: 0 K/uL (ref 0.0–0.1)
Basophils Absolute: 0 K/uL (ref 0.0–0.1)
Basophils Relative: 0 %
Basophils Relative: 0 %
Eosinophils Absolute: 0 K/uL (ref 0.0–0.5)
Eosinophils Absolute: 0 K/uL (ref 0.0–0.5)
Eosinophils Relative: 0 %
Eosinophils Relative: 0 %
HCT: 38.5 % (ref 36.0–46.0)
HCT: 38 % (ref 36.0–46.0)
Hemoglobin: 12.8 g/dL (ref 12.0–15.0)
Hemoglobin: 12.7 g/dL (ref 12.0–15.0)
Immature Granulocytes: 0 %
Immature Granulocytes: 0 %
Lymphocytes Relative: 9 %
Lymphocytes Relative: 8 %
Lymphs Abs: 1.3 K/uL (ref 0.7–4.0)
Lymphs Abs: 1.1 K/uL (ref 0.7–4.0)
MCH: 28.3 pg (ref 26.0–34.0)
MCH: 28.2 pg (ref 26.0–34.0)
MCHC: 33.2 g/dL (ref 30.0–36.0)
MCHC: 33.4 g/dL (ref 30.0–36.0)
MCV: 85 fL (ref 80.0–100.0)
MCV: 84.4 fL (ref 80.0–100.0)
Monocytes Absolute: 0.9 K/uL (ref 0.1–1.0)
Monocytes Absolute: 0.6 K/uL (ref 0.1–1.0)
Monocytes Relative: 7 %
Monocytes Relative: 4 %
Neutro Abs: 11.5 K/uL — ABNORMAL HIGH (ref 1.7–7.7)
Neutro Abs: 12.1 K/uL — ABNORMAL HIGH (ref 1.7–7.7)
Neutrophils Relative %: 84 %
Neutrophils Relative %: 88 %
Platelets: 331 K/uL (ref 150–400)
Platelets: 297 K/uL (ref 150–400)
RBC: 4.53 MIL/uL (ref 3.87–5.11)
RBC: 4.5 MIL/uL (ref 3.87–5.11)
RDW: 14.2 % (ref 11.5–15.5)
RDW: 14 % (ref 11.5–15.5)
WBC: 13.8 K/uL — ABNORMAL HIGH (ref 4.0–10.5)
WBC: 13.8 K/uL — ABNORMAL HIGH (ref 4.0–10.5)
nRBC: 0 % (ref 0.0–0.2)
nRBC: 0 % (ref 0.0–0.2)

## 2024-03-02 LAB — URINALYSIS, MICROSCOPIC (REFLEX)

## 2024-03-02 LAB — MAGNESIUM: Magnesium: 1.7 mg/dL (ref 1.7–2.4)

## 2024-03-02 LAB — LIPASE, BLOOD: Lipase: 19 U/L (ref 11–51)

## 2024-03-02 MED ORDER — PROMETHAZINE HCL 25 MG RE SUPP: 25.0000 mg | Freq: Four times a day (QID) | RECTAL | 0 refills | Status: DC | PRN

## 2024-03-02 MED ORDER — SODIUM CHLORIDE 0.9 % IV BOLUS
1000.0000 mL | Freq: Once | INTRAVENOUS | Status: AC
  Administered 2024-03-02: 1000 mL via INTRAVENOUS

## 2024-03-02 MED ORDER — POTASSIUM CHLORIDE CRYS ER 20 MEQ PO TBCR
40.0000 meq | EXTENDED_RELEASE_TABLET | Freq: Once | ORAL | Status: AC
  Administered 2024-03-02: 40 meq via ORAL
  Filled 2024-03-02: qty 2

## 2024-03-02 MED ORDER — ONDANSETRON 4 MG PO TBDP
4.0000 mg | ORAL_TABLET | Freq: Once | ORAL | Status: AC | PRN
  Administered 2024-03-02: 4 mg via ORAL
  Filled 2024-03-02: qty 1

## 2024-03-02 MED ORDER — DIPHENHYDRAMINE HCL 50 MG/ML IJ SOLN
25.0000 mg | Freq: Once | INTRAMUSCULAR | Status: AC
  Administered 2024-03-02: 25 mg via INTRAVENOUS
  Filled 2024-03-02: qty 1

## 2024-03-02 MED ORDER — ONDANSETRON 4 MG PO TBDP
4.0000 mg | ORAL_TABLET | Freq: Once | ORAL | Status: AC
  Administered 2024-03-02: 4 mg via ORAL
  Filled 2024-03-02: qty 1

## 2024-03-02 MED ORDER — PROCHLORPERAZINE EDISYLATE 10 MG/2ML IJ SOLN
5.0000 mg | Freq: Once | INTRAMUSCULAR | Status: AC
  Administered 2024-03-02: 5 mg via INTRAVENOUS
  Filled 2024-03-02: qty 2

## 2024-03-02 MED ORDER — ALUM & MAG HYDROXIDE-SIMETH 200-200-20 MG/5ML PO SUSP
30.0000 mL | Freq: Once | ORAL | Status: AC
  Administered 2024-03-02: 30 mL via ORAL
  Filled 2024-03-02: qty 30

## 2024-03-02 MED ORDER — LORAZEPAM 1 MG PO TABS
2.0000 mg | ORAL_TABLET | Freq: Once | ORAL | Status: AC
  Administered 2024-03-02: 2 mg via ORAL
  Filled 2024-03-02: qty 2

## 2024-03-02 MED ORDER — DIPHENHYDRAMINE HCL 25 MG PO CAPS
25.0000 mg | ORAL_CAPSULE | Freq: Once | ORAL | Status: AC
  Administered 2024-03-02: 25 mg via ORAL
  Filled 2024-03-02: qty 1

## 2024-03-02 MED ORDER — ZIKS ARTHRITIS PAIN RELIEF 0.025-1-12 % EX CREA: 1.0000 | TOPICAL_CREAM | Freq: Three times a day (TID) | CUTANEOUS | 0 refills | Status: DC | PRN

## 2024-03-02 MED ORDER — DROPERIDOL 2.5 MG/ML IJ SOLN
2.5000 mg | Freq: Once | INTRAMUSCULAR | Status: AC
  Administered 2024-03-02: 2.5 mg via INTRAVENOUS
  Filled 2024-03-02: qty 2

## 2024-03-02 NOTE — Discharge Instructions (Signed)
 Use Phenergan  suppositories as previously prescribed. Follow-up with your primary care provider for recheck.

## 2024-03-02 NOTE — ED Provider Notes (Signed)
 28 year old female with complaint of n/v, tingling in the fingers and cramping in the hands.  Seen here earlier today, given droperidol , went home meds wore off and symptoms returned.  Patient seen by prior provider, provided with medications and fluids.  Mild hypokalemia.  Plan is to replace orally and reassess. Physical Exam  BP (!) 163/105   Pulse 68   Temp 97.9 F (36.6 C) (Oral)   Resp (!) 24   Ht 5' 3 (1.6 m)   Wt 53.5 kg   LMP 02/09/2024 (Exact Date)   SpO2 99%   BMI 20.89 kg/m   Physical Exam  Procedures  Procedures  ED Course / MDM   Clinical Course as of 03/02/24 1746  Fri Mar 02, 2024  1516 Glucose-Capillary(!): 142 [HN]    Clinical Course User Index [HN] Franklyn Sid SAILOR, MD   Medical Decision Making Amount and/or Complexity of Data Reviewed Labs:  Decision-making details documented in ED Course.  Risk Prescription drug management.   Patient tolerating p.o. fluids and oral potassium.  Plan is to discharge.       Beverley Leita LABOR, PA-C 03/02/24 1746    Dreama Longs, MD 03/03/24 1140

## 2024-03-02 NOTE — ED Triage Notes (Addendum)
 Patient c/o emesis x6 tonight. Patient was seen at [facility] tonight with same complain. Patient states she's not been better after discharge. Patient denies Chest pain and SOB.

## 2024-03-02 NOTE — ED Provider Notes (Signed)
 I was called to triage to assess patient  Patient was seen earlier today, nausea, vomiting and abdominal pain.  Workup during earlier evaluation was stable patient was discharged in stable condition after resolution of her symptoms.  Patient reports that she took a nap when she got home woke up and her symptoms had returned.  Felt she was having some tingling and cramping in her right arm.  Her neurologic exam is nonfocal, NIH stroke scale 0.  VAN negative. No acute neurologic deficits on exam. Patient is diaphoretic, appears uncomfortable.  Question dystonic reaction from droperidol  earlier in the day.  Screening labs ordered, medications ordered.  I have low suspicion for acute intracranial pathology. Pt became upset during preliminary evaluation and walked out of treatment area.    Elnor Jayson LABOR, DO 03/02/24 1605

## 2024-03-02 NOTE — ED Triage Notes (Addendum)
 Pt states that she feel like she is having a stroke. Pt states that she woke up about 30 minutes ago and noticed it. Pt states that her right hand is drawn up and she can't open her fist. EDP called to triage. Denies vision changes. Pt is noted to diaphoretic in triage.   Pt walked out of triage room and did not finish answering questions

## 2024-03-02 NOTE — Discharge Instructions (Addendum)
 It was a pleasure caring for you today in the emergency department.  Please refrain from cannabis use in the future, this includes smoking/vaping/edibles/CBG oils/ etc. Avoid tobacco, alcohol, NSAID's such as motrin /goody powder.   Be sure to follow a bland diet, drink plenty of clear liquids, get plenty of rest  Follow up in the office with your PCP  Please return to the emergency department for any worsening or worrisome symptoms.        RESOURCE GUIDE  Chronic Pain Problems: Contact [facility]  [phone] Patients need to be referred by their primary care doctor.  Insufficient Money for Medicine: Contact [employer]:  call [phone]  No Primary Care Doctor: Call [facility]  [phone] - can help you locate a primary care doctor that  accepts your insurance, provides certain services, etc. Physician Referral Service- [phone]  Agencies that provide inexpensive medical care: [facility]  [phone] [facility]  [phone] [facility]  [phone] [facility]  [phone] [facility]  [phone] [facility]  [phone]  Medicaid-accepting [facility] Providers: [facility]- [address], [address]  [phone], Mon-Fri 9am-7pm, Sat 9am-1pm [facility]- [address] [place], [address] [place]  [phone] [facility]- [address], [address] [place]  [phone] [facility]- [address]  [phone] [facility]- [address] [place], [address], [phone]  Only accepts [place] patients after they have their name  applied to their card  Self Pay (no insurance) in [facility]: Sickle Cell Patients - [facility]  [address] [place], [phone] [facility]- [address] [place]  [phone]       [place] [facility] [place]- [address]       -      [facility]- see information above (Speak to [employer] if you do not have insurance)       -  [facility]- [address],  [phone]       -  [facility]- [address] [phone]       -  [facility]-  [address], [place], [phone]       -  [facility] - [address] [phone]       -  [facility]- [address], [phone], also [address] [phone]       -     [facility]- [address] [place], [phone], 1st & 3rd Saturday         every month, 10am-1pm  -     [facility]   [address] [place], [place].   Phone:  [phone], Fax:  [phone]. Hours of Operation:  9 am - 6 pm, M-F.  -     [facility]   [address],    Phone: [phone], Fax: [phone]. Hours of Operation:  8:30 am - 5:30 pm, M-F.    Dental Assistance If unable to pay or uninsured, contact:  [facility]. to become qualified for the adult dental clinic.  Patients with Medicaid: [facility] [id number] [address], [phone] [address] [phone]  If unable to pay, or uninsured,  contact [facility] Department [phone] in [place], [phone] in [facility]) to become qualified for the adult dental clinic  [facility] [address] [place], [place] [address] [phone] [email or web address]  Other Proofreader Services: [facility]- [address] [place], [place], [place], [address], [phone], Ext. [phone], 2nd and 4th Thursday of the month at 6:30am.  10 clients each day by appointment, can sometimes see walk-in patients if someone does not show for an appointment. [facility]- [address] [place], [place], [address], [phone] [facility] [address], [place], [place], [address], [phone] [facility]  Department- [phone] [facility] Department- [phone] [facility] [phone]

## 2024-03-02 NOTE — ED Provider Notes (Addendum)
 [place] [facility]GENC[facility] AT [facility] Provider Note  CSN: [id number] Arrival date & time: 03/02/24 9342  Chief Complaint(s) Emesis  HPI Christine Trujillo is a 28 y.o. female with past medical history as below, significant for CHF, DM, GERD, cyclical vomiting who presents to the ED with complaint of nausea vomiting abdominal pain  Patient reports that she began having nausea and vomiting diarrhea last night early this morning.  Recently had a dental extraction and was started antibiotics yesterday. Pt reports generalized abd cramping/pain. No fever or chills, no cp or dib. Pt reports still using THC products, denies illicit drug use. No recent diet changes   Past Medical History Past Medical History:  Diagnosis Date   Anemia    Anxiety    Cannabinoid hyperemesis syndrome    Depression    Diabetes mellitus without complication (HCC)    GERD (gastroesophageal reflux disease)    History of anemia    during pregnancy   History of gestational diabetes    Ovarian cyst    left   Pelvic pain    Upper respiratory symptom    symptoms started 02-16-2021 cough/ runny nose;  pcp visit 03-10-2021 with sore throat/ congestion/ sob/ body aches/ cough but no fever, negative covid/ flu/ strep resultsin epic  (03-13-2021  pt stated today only has nonproductive cough)   Patient Active Problem List   Diagnosis Date Noted   Cyclical vomiting syndrome 01/05/2024   Cannabinoid hyperemesis syndrome 10/10/2023   Tobacco abuse 06/27/2023   Intractable nausea and vomiting 01/28/2023   Hypokalemia 01/28/2023   Pelvic pain in female 03/17/2021   Request for sterilization 03/17/2021   Gastroesophageal reflux disease 03/04/2021   Pain of upper abdomen    Non-intractable vomiting    Family history- stomach cancer    Diarrhea    Change in bowel habits    Depression, major, single episode, 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 Authorizing Provider  Capsaicin -Menthol -Methyl Sal (CAPSAICIN -METHYL SAL-MENTHOL ) 0.025-1-12 % CREA Apply 1 Application topically 3 (three) times daily as needed. 03/02/24  Yes Elnor Savant A, DO  amitriptyline  (ELAVIL ) 10 MG tablet Take 1 tablet (10 mg total) by mouth at bedtime. 01/23/24   Frann Mabel Mt, DO  amLODipine  (NORVASC ) 5 MG tablet Take 1 tablet (5 mg total) by mouth daily. 01/07/24   Barbarann Nest, MD  capsaicin  (ZOSTRIX) 0.025 % cream Apply topically 2 (two) times daily. 01/06/24   Barbarann Nest, MD  famotidine  (PEPCID ) 20 MG tablet Take 1 tablet (20 mg total) by mouth 2 (two) times daily. 07/11/23   Frann Mabel Mt, DO  hydrOXYzine  (ATARAX ) 25 MG tablet Take 1 tablet (25 mg total) by mouth 3 (three) times daily as needed for anxiety (refractory n/v). 01/23/24   Frann Mabel Mt, DO  ibuprofen  (ADVIL ) 800 MG tablet Take 1 tablet (800 mg total) by mouth 3 (three) times daily. 02/24/24   Geiple, Joshua, PA-C  metoCLOPramide  (REGLAN ) 10 MG tablet Take 0.5 tablets (5 mg total) by mouth 3 (three) times daily. 01/06/24 01/05/25  Barbarann Nest, MD  Multiple Vitamins-Minerals (MULTIVITAMIN WITH MINERALS) tablet  Take 1 tablet by mouth daily.    [provider]  ondansetron  (ZOFRAN ) 4 MG tablet Take 1 tablet (4 mg total) by mouth every 6 (six) hours. 02/17/24   Nivia Colon, PA-C  ondansetron  (ZOFRAN -ODT) 4 MG disintegrating tablet Take 1 tablet (4 mg total) by mouth every 8 (eight) hours as needed for nausea or vomiting. Patient taking differently: Take 4 mg by mouth every 8 (eight) hours as needed for  nausea or vomiting (dissolve orally). 10/08/23   Roselyn Carlin NOVAK, MD  oxyCODONE -acetaminophen  (PERCOCET) 5-325 MG tablet Take 1-2 tablets by mouth every 6 (six) hours as needed. 02/20/24   Geroldine Berg, MD  pantoprazole  (PROTONIX ) 40 MG tablet Take 1 tablet (40 mg total) by mouth daily. 10/12/23   Rai, Nydia POUR, MD  prochlorperazine  (COMPAZINE ) 10 MG tablet Take 1 tablet (10 mg total) by mouth 2 (two) times daily as needed for nausea or vomiting. 02/19/24   Neysa Caron PARAS, DO  promethazine  (PHENERGAN ) 25 MG suppository Place 1 suppository (25 mg total) rectally every 6 (six) hours as needed for refractory nausea / vomiting. 03/02/24   Elnor Jayson LABOR, DO  Vitamin D , Ergocalciferol , (DRISDOL ) 1.25 MG (50000 UNIT) CAPS capsule Take 1 capsule (50,000 Units total) by mouth every 7 (seven) days. 01/24/24   Frann Mabel Mt, DO                                                                                                                                    Past Surgical History Past Surgical History:  Procedure Laterality Date   BIOPSY  10/22/2020   Procedure: BIOPSY;  Surgeon: San Sandor GAILS, DO;  Location: [facility];  Service: Gastroenterology;;   CHOLECYSTECTOMY N/A 12/02/2016   Procedure: LAPAROSCOPIC CHOLECYSTECTOMY WITH INTRAOPERATIVE CHOLANGIOGRAM;  Surgeon: Ethyl Lenis, MD;  Location: [facility];  Service: General;  Laterality: N/A;   COLONOSCOPY WITH PROPOFOL  N/A 10/22/2020   Procedure: COLONOSCOPY WITH PROPOFOL ;  Surgeon: San Sandor GAILS, DO;  Location: [facility];  Service: Gastroenterology;  Laterality: N/A;   ESOPHAGOGASTRODUODENOSCOPY (EGD) WITH PROPOFOL  N/A 10/22/2020   Procedure: ESOPHAGOGASTRODUODENOSCOPY (EGD) WITH PROPOFOL ;  Surgeon: San Sandor GAILS, DO;  Location: [facility];  Service: Gastroenterology;  Laterality: N/A;   ESOPHAGOGASTRODUODENOSCOPY (EGD) WITH PROPOFOL  N/A 10/22/2020   Procedure: ESOPHAGOGASTRODUODENOSCOPY (EGD) WITH PROPOFOL ;  Surgeon:  San Sandor GAILS, DO;  Location: [facility];  Service: Gastroenterology;  Laterality: N/A;   LAPAROSCOPIC OVARIAN CYSTECTOMY Left 03/17/2021   Procedure: LAPAROSCOPIC Resection of Adnexal Mass;  Surgeon: Corene Coy, MD;  Location: [facility];  Service: Gynecology;  Laterality: Left;   LAPAROSCOPIC SALPINGO OOPHERECTOMY Bilateral 03/17/2021   Procedure: LAPAROSCOPIC BIALTERAL SALPINGECTOMY;  Surgeon: Corene Coy, MD;  Location: [facility];  Service: Gynecology;  Laterality: Bilateral;   TUBAL LIGATION     pt unsure of affected side (procedure on 03/17/21)   WISDOM TOOTH EXTRACTION     Family History Family History  Problem Relation Age of Onset   Diabetes Mother    Heart disease Mother        CHF   Stomach cancer Brother    Colon cancer Neg Hx    Pancreatic cancer Neg Hx    Esophageal cancer Neg Hx    Liver disease Neg Hx     Social History Social History   Tobacco Use   Smoking status: Every Day    Current packs/day: 0.50    Average packs/day: 0.5 packs/day for 5.0 years (2.5 ttl pk-yrs)    Types: Cigarettes   Smokeless tobacco: Never  Vaping Use   Vaping status: Never Used  Substance Use Topics   Alcohol use: Never   Drug use: Yes    Types: Marijuana    Comment: 10-12 blunts per day   Allergies Patient has no known allergies.  Review of Systems A thorough review of systems was obtained and all systems are negative except as noted in the HPI and PMH.   Physical Exam Vital Signs  I have reviewed the triage vital signs BP (!) 155/75   Pulse 72   Temp 97.7 F (36.5 C) (Oral)   Resp 20   Wt 117.9 kg   LMP 02/09/2024 (Exact Date)   SpO2 100%   BMI 46.06 kg/m  Physical Exam Vitals and nursing note reviewed.  Constitutional:      General: She is not in acute distress.    Appearance: Normal appearance. She is well-developed. She is obese. She is not ill-appearing.  HENT:     Head: Normocephalic and  atraumatic.     Right Ear: External ear normal.     Left Ear: External ear normal.     Nose: Nose normal.     Mouth/Throat:     Mouth: Mucous membranes are moist.  Eyes:     General: No scleral icterus.       Right eye: No discharge.        Left eye: No discharge.  Cardiovascular:     Rate and Rhythm: Normal rate.  Pulmonary:     Effort: Pulmonary effort is normal. No respiratory distress.     Breath sounds: No stridor.  Abdominal:     General: Abdomen is flat. There is no distension.     Palpations: Abdomen is soft.     Tenderness: There is no guarding.  Musculoskeletal:        General: No deformity.     Cervical back: No rigidity.  Skin:    General: Skin is warm and dry.     Coloration: Skin is not cyanotic, jaundiced or pale.  Neurological:     Mental Status: She is alert.  Psychiatric:        Speech: Speech normal.        Behavior: Behavior normal. Behavior is cooperative.     ED Results and Treatments Labs (all labs ordered are listed, but only abnormal results are displayed) Labs Reviewed  COMPREHENSIVE METABOLIC PANEL WITH GFR - Abnormal; Notable for the following components:      Result Value   CO2 18 (*)    Glucose, Bld 129 (*)    Anion gap 17 (*)    All other components within normal limits  URINALYSIS, ROUTINE W REFLEX MICROSCOPIC - Abnormal; Notable for the following components:   Hgb urine dipstick TRACE (*)    Ketones, ur 80 (*)    All other components within normal limits  URINE DRUG SCREEN - Abnormal; Notable for  the following components:   Tetrahydrocannabinol POSITIVE (*)    All other components within normal limits  CBC WITH DIFFERENTIAL/PLATELET - Abnormal; Notable for the following components:   WBC 13.8 (*)    Neutro Abs 11.5 (*)    All other components within normal limits  URINALYSIS, MICROSCOPIC (REFLEX) - Abnormal; Notable for the following components:   Bacteria, UA FEW (*)    All other components within normal limits  LIPASE, BLOOD   PREGNANCY, URINE                                                                                                                          Radiology No results found.  Pertinent labs & imaging results that were available during my care of the patient were reviewed by me and considered in my medical decision making (see MDM for details).  Medications Ordered in ED Medications  ondansetron  (ZOFRAN -ODT) disintegrating tablet 4 mg (4 mg Oral Given 03/02/24 0710)  droperidol  (INAPSINE ) 2.5 MG/ML injection 2.5 mg (2.5 mg Intravenous Given 03/02/24 0825)  diphenhydrAMINE  (BENADRYL ) injection 25 mg (25 mg Intravenous Given 03/02/24 0827)  sodium chloride  0.9 % bolus 1,000 mL (0 mLs Intravenous Stopped 03/02/24 1008)  alum & mag hydroxide-simeth (MAALOX/MYLANTA) 200-200-20 MG/5ML suspension 30 mL (30 mLs Oral Given 03/02/24 0948)  prochlorperazine  (COMPAZINE ) injection 5 mg (5 mg Intravenous Given 03/02/24 0948)                                                                                                                                     Procedures Procedures  (including critical care time)  Medical Decision Making / ED Course    Medical Decision Making:    Puanani Gene is a 28 y.o. female  with past medical history as below, significant for CHF, DM, GERD, cyclical vomiting who presents to the ED with complaint of nausea vomiting abdominal pain. The complaint involves an extensive differential diagnosis and also carries with it a high risk of complications and morbidity.  Serious etiology was considered. Ddx includes but is not limited to: Differential diagnosis includes but is not exclusive to acute cholecystitis, intrathoracic causes for epigastric abdominal pain, gastritis, duodenitis, pancreatitis, small bowel or large bowel obstruction, abdominal aortic aneurysm, hernia, gastritis, etc.   Complete initial physical exam performed, notably the patient was in NAD.    Reviewed and  confirmed nursing documentation for past medical  history, family history, social history.  Vital signs reviewed.    Nausea/vomiting Cannabinoid hyperemesis syndrome > - pt w/ known hx CHS, cyclical vomiting, continues to use THC products, well known to this facility here w/ n/v abd cramping - pt wretching on arrival, non-productive  - labs similar to prior , concern dehydration - do not feel emergent imaging is needed at this time given resolution of symptoms, stable labs - on recheck feeling much better, tolerating PO, emesis has subsided.  - encouraged THC cessation - bland diet, rehydration also encouraged - likely CHS. Viral syndrome also consideration, gastroenteritis.    Clinical Course as of 03/02/24 1054  Fri Mar 02, 2024  9153 Symptoms resolved [SG]  1017 Tetrahydrocannabinol(!): POSITIVE [SG]  1053 WBC(!): 13.8 Likely de margination from vomiting  [SG]    Clinical Course User Index [SG] Elnor Jayson LABOR, DO     10:54 AM:  I have discussed the diagnosis/risks/treatment options with the patient.  Evaluation and diagnostic testing in the emergency department does not suggest an emergent condition requiring admission or immediate intervention beyond what has been performed at this time.  They will follow up with pcp. We also discussed returning to the ED immediately if new or worsening sx occur. We discussed the sx which are most concerning (e.g., sudden worsening pain, fever, inability to tolerate by mouth) that necessitate immediate return.    The patient appears reasonably screened and/or stabilized for discharge and I doubt any other medical condition or other [facility] requiring further screening, evaluation, or treatment in the ED at this time prior to discharge.                 Additional history obtained: -Additional history obtained from na -External records from outside source obtained and reviewed including: Chart review including previous notes, labs,  imaging, consultation notes including  Prior er eval Home meds Prior labs   Lab Tests: -I ordered, reviewed, and interpreted labs.   The pertinent results include:   Labs Reviewed  COMPREHENSIVE METABOLIC PANEL WITH GFR - Abnormal; Notable for the following components:      Result Value   CO2 18 (*)    Glucose, Bld 129 (*)    Anion gap 17 (*)    All other components within normal limits  URINALYSIS, ROUTINE W REFLEX MICROSCOPIC - Abnormal; Notable for the following components:   Hgb urine dipstick TRACE (*)    Ketones, ur 80 (*)    All other components within normal limits  URINE DRUG SCREEN - Abnormal; Notable for the following components:   Tetrahydrocannabinol POSITIVE (*)    All other components within normal limits  CBC WITH DIFFERENTIAL/PLATELET - Abnormal; Notable for the following components:   WBC 13.8 (*)    Neutro Abs 11.5 (*)    All other components within normal limits  URINALYSIS, MICROSCOPIC (REFLEX) - Abnormal; Notable for the following components:   Bacteria, UA FEW (*)    All other components within normal limits  LIPASE, BLOOD  PREGNANCY, URINE    Notable for dehydration  EKG   EKG Interpretation Date/Time:  Friday March 02 2024 08:18:41 EST Ventricular Rate:  59 PR Interval:  137 QRS Duration:  96 QT Interval:  459 QTC Calculation: 455 R Axis:   70  Text Interpretation: Sinus rhythm Confirmed by Elnor Jayson ([id number]) on 03/02/2024 8:46:37 AM         Imaging Studies ordered: na   Medicines ordered and prescription drug management: Meds ordered this encounter  Medications   ondansetron  (ZOFRAN -ODT) disintegrating tablet 4 mg   droperidol  (INAPSINE ) 2.5 MG/ML injection 2.5 mg   diphenhydrAMINE  (BENADRYL ) injection 25 mg   sodium chloride  0.9 % bolus 1,000 mL   Capsaicin -Menthol -Methyl Sal (CAPSAICIN -METHYL SAL-MENTHOL ) 0.025-1-12 % CREA    Sig: Apply 1 Application topically 3 (three) times daily as needed.    Dispense:  56.6 g     Refill:  0   promethazine  (PHENERGAN ) 25 MG suppository    Sig: Place 1 suppository (25 mg total) rectally every 6 (six) hours as needed for refractory nausea / vomiting.    Dispense:  6 each    Refill:  0   alum & mag hydroxide-simeth (MAALOX/MYLANTA) 200-200-20 MG/5ML suspension 30 mL   prochlorperazine  (COMPAZINE ) injection 5 mg    -I have reviewed the patients home medicines and have made adjustments as needed   Consultations Obtained: na   Cardiac Monitoring: Continuous pulse oximetry interpreted by myself, 100% on RA.    Social Determinants of Health:  Diagnosis or treatment significantly limited by social determinants of health: current smoker and obesity, thc use Counseled patient for approximately 3 minutes regarding smoking/thc cessation. Discussed risks of smoking and how they applied and affected their visit here today. Patient not ready to quit at this time, however will follow up with their primary doctor when they are.   CPT code: 00593: intermediate counseling for smoking cessation     Reevaluation: After the interventions noted above, I reevaluated the patient and found that they have improved  Co morbidities that complicate the patient evaluation  Past Medical History:  Diagnosis Date   Anemia    Anxiety    Cannabinoid hyperemesis syndrome    Depression    Diabetes mellitus without complication (HCC)    GERD (gastroesophageal reflux disease)    History of anemia    during pregnancy   History of gestational diabetes    Ovarian cyst    left   Pelvic pain    Upper respiratory symptom    symptoms started 02-16-2021 cough/ runny nose;  pcp visit 03-10-2021 with sore throat/ congestion/ sob/ body aches/ cough but no fever, negative covid/ flu/ strep resultsin epic  (03-13-2021  pt stated today only has nonproductive cough)      Dispostion: Disposition decision including need for hospitalization was considered, and patient discharged from emergency  department.    Final Clinical Impression(s) / ED Diagnoses Final diagnoses:  Cannabinoid hyperemesis syndrome  Nausea vomiting and diarrhea        Elnor Jayson LABOR, DO 03/02/24 1053    Elnor Jayson A, DO 03/02/24 1054

## 2024-03-02 NOTE — ED Provider Notes (Signed)
 [place] [facility] AT [facility] Provider Note   CSN: [id number] Arrival date & time: 03/02/24  1333     History {Add pertinent medical, surgical, social history, OB history to HPI:1} Chief Complaint  Patient presents with  . Numbness    Christine Trujillo is a 28 y.o. female with PMH as listed below who presents with nausea/vomiting, all-over abdominal pain, and bilateral finger numbness/cramping. Patient was seen earlier this AM for N/V/D and all-over abdominal pain c/w CHS. She received droperidol  and sxs improved. She went home and her sxs returned. She also developed numbness/tingling and cramping in her bilateral fingers. She is diaphoretic and uncomfortable appearing on my evaluation. She states the sxs are the same as earlier and came back when the droperidol  wore off. She requests haldol . Denies asymmetric N/T or weakness, visual changes, urinary sxs, f/c, CP.    Past Medical History:  Diagnosis Date  . Anemia   . Anxiety   . Cannabinoid hyperemesis syndrome   . Depression   . Diabetes mellitus without complication (HCC)   . GERD (gastroesophageal reflux disease)   . History of anemia    during pregnancy  . History of gestational diabetes   . Ovarian cyst    left  . Pelvic pain   . Upper respiratory symptom    symptoms started 02-16-2021 cough/ runny nose;  pcp visit 03-10-2021 with sore throat/ congestion/ sob/ body aches/ cough but no fever, negative covid/ flu/ strep resultsin epic  (03-13-2021  pt stated today only has nonproductive cough)       Home Medications Prior to Admission medications   Medication Sig Start Date End Date Taking? Authorizing Provider  amitriptyline  (ELAVIL ) 10 MG tablet Take 1 tablet (10 mg total) by mouth at bedtime. 01/23/24   Frann Mabel Mt, DO  amLODipine  (NORVASC ) 5 MG tablet Take 1 tablet (5 mg total) by mouth daily. 01/07/24   Barbarann Nest, MD  capsaicin  (ZOSTRIX) 0.025 % cream Apply  topically 2 (two) times daily. 01/06/24   Barbarann Nest, MD  Capsaicin -Menthol -Methyl Sal (CAPSAICIN -METHYL SAL-MENTHOL ) 0.025-1-12 % CREA Apply 1 Application topically 3 (three) times daily as needed. 03/02/24   Elnor Jayson LABOR, DO  famotidine  (PEPCID ) 20 MG tablet Take 1 tablet (20 mg total) by mouth 2 (two) times daily. 07/11/23   Frann Mabel Mt, DO  hydrOXYzine  (ATARAX ) 25 MG tablet Take 1 tablet (25 mg total) by mouth 3 (three) times daily as needed for anxiety (refractory n/v). 01/23/24   Frann Mabel Mt, DO  ibuprofen  (ADVIL ) 800 MG tablet Take 1 tablet (800 mg total) by mouth 3 (three) times daily. 02/24/24   Desiderio Chew, PA-C  metoCLOPramide  (REGLAN ) 10 MG tablet Take 0.5 tablets (5 mg total) by mouth 3 (three) times daily. 01/06/24 01/05/25  Barbarann Nest, MD  Multiple Vitamins-Minerals (MULTIVITAMIN WITH MINERALS) tablet Take 1 tablet by mouth daily.    [provider]  ondansetron  (ZOFRAN ) 4 MG tablet Take 1 tablet (4 mg total) by mouth every 6 (six) hours. 02/17/24   Nivia Colon, PA-C  ondansetron  (ZOFRAN -ODT) 4 MG disintegrating tablet Take 1 tablet (4 mg total) by mouth every 8 (eight) hours as needed for nausea or vomiting. Patient taking differently: Take 4 mg by mouth every 8 (eight) hours as needed for nausea or vomiting (dissolve orally). 10/08/23   Roselyn Carlin NOVAK, MD  oxyCODONE -acetaminophen  (PERCOCET) 5-325 MG tablet Take 1-2 tablets by mouth every 6 (six) hours as needed. 02/20/24   Geroldine Berg, MD  pantoprazole  (  PROTONIX ) 40 MG tablet Take 1 tablet (40 mg total) by mouth daily. 10/12/23   Rai, Nydia POUR, MD  prochlorperazine  (COMPAZINE ) 10 MG tablet Take 1 tablet (10 mg total) by mouth 2 (two) times daily as needed for nausea or vomiting. 02/19/24   Neysa Caron PARAS, DO  promethazine  (PHENERGAN ) 25 MG suppository Place 1 suppository (25 mg total) rectally every 6 (six) hours as needed for refractory nausea / vomiting. 03/02/24   Elnor Savant A, DO   Vitamin D , Ergocalciferol , (DRISDOL ) 1.25 MG (50000 UNIT) CAPS capsule Take 1 capsule (50,000 Units total) by mouth every 7 (seven) days. 01/24/24   Frann Mabel Mt, DO      Allergies    Patient has no known allergies.    Review of Systems   Review of Systems A 10 point review of systems was performed and is negative unless otherwise reported in HPI.  Physical Exam Updated Vital Signs BP (!) 163/105   Pulse 68   Temp 97.9 F (36.6 C) (Oral)   Resp (!) 24   Ht 5' 3 (1.6 m)   Wt 53.5 kg   LMP 02/09/2024 (Exact Date)   SpO2 99%   BMI 20.89 kg/m  Physical Exam General: Uncomfortable appearing female, lying in bed.  HEENT: PERRLA, Sclera anicteric, MMM, trachea midline.  Cardiology: RRR, no murmurs/rubs/gallops. BL radial and DP pulses equal bilaterally.  Resp: Normal respiratory rate and effort. CTAB, no wheezes, rhonchi, crackles.  Abd: Soft, non-tender, non-distended. No rebound tenderness or guarding.  GU: Deferred. MSK: No peripheral edema or signs of trauma. Extremities without deformity or TTP. No cyanosis or clubbing. Skin: warm, dry. No rashes or lesions. Back: No CVA tenderness Neuro: A&Ox4, CNs II-XII grossly intact. MAEs. Sensation grossly intact.  Psych: Normal mood and affect.   ED Results / Procedures / Treatments   Labs (all labs ordered are listed, but only abnormal results are displayed) Labs Reviewed  CBG MONITORING, ED - Abnormal; Notable for the following components:      Result Value   Glucose-Capillary 142 (*)    All other components within normal limits  CBC WITH DIFFERENTIAL/PLATELET  COMPREHENSIVE METABOLIC PANEL WITH GFR  MAGNESIUM   CBG MONITORING, ED    EKG None  Radiology No results found.  Procedures Procedures  {Document cardiac monitor, telemetry assessment procedure when appropriate:1}  Medications Ordered in ED Medications  LORazepam  (ATIVAN ) tablet 2 mg (has no administration in time range)  sodium chloride  0.9 %  bolus 1,000 mL (has no administration in time range)  ondansetron  (ZOFRAN -ODT) disintegrating tablet 4 mg (has no administration in time range)  diphenhydrAMINE  (BENADRYL ) capsule 25 mg (has no administration in time range)    ED Course/ Medical Decision Making/ A&P                          Medical Decision Making   This patient presents to the ED for concern of ***, this involves an extensive number of treatment options, and is a complaint that carries with it a high risk of complications and morbidity.  I considered the following differential and admission for this acute, potentially life threatening condition.   MDM:    Patient with recurrent sxs from thsi AM thought to be due to [facility]. She has ***no significant electrolyte derangements/renal injury. She appears uncomfortable and is vomiting. ***  Clinical Course as of 03/02/24 1517  Fri Mar 02, 2024  1516 Glucose-Capillary(!): 142 [HN]    Clinical Course  User Index [HN] Franklyn Sid SAILOR, MD    Labs: I Ordered, and personally interpreted labs.  The pertinent results include:  those listed above  Imaging Studies ordered: I ordered imaging studies including *** I independently visualized and interpreted imaging. I agree with the radiologist interpretation  Additional history obtained from those lsited above  Reevaluation: After the interventions noted above, I reevaluated the patient and found that they have :{resolved/improved/worsened:23923::improved}  Social Determinants of Health: .Lives independently  Disposition:  ***  Co morbidities that complicate the patient evaluation . Past Medical History:  Diagnosis Date  . Anemia   . Anxiety   . Cannabinoid hyperemesis syndrome   . Depression   . Diabetes mellitus without complication (HCC)   . GERD (gastroesophageal reflux disease)   . History of anemia    during pregnancy  . History of gestational diabetes   . Ovarian cyst    left  . Pelvic pain   . Upper  respiratory symptom    symptoms started 02-16-2021 cough/ runny nose;  pcp visit 03-10-2021 with sore throat/ congestion/ sob/ body aches/ cough but no fever, negative covid/ flu/ strep resultsin epic  (03-13-2021  pt stated today only has nonproductive cough)     Medicines Meds ordered this encounter  Medications  . LORazepam  (ATIVAN ) tablet 2 mg  . sodium chloride  0.9 % bolus 1,000 mL  . ondansetron  (ZOFRAN -ODT) disintegrating tablet 4 mg  . diphenhydrAMINE  (BENADRYL ) capsule 25 mg    I have reviewed the patients home medicines and have made adjustments as needed  Problem List / ED Course: Problem List Items Addressed This Visit   None        {Document critical care time when appropriate:1} {Document review of labs and clinical decision tools ie heart score, Chads2Vasc2 etc:1}  {Document your independent review of radiology images, and any outside records:1} {Document your discussion with family members, caretakers, and with consultants:1} {Document social determinants of health affecting pt's care:1} {Document your decision making why or why not admission, treatments were needed:1}  This note was created using dictation software, which may contain spelling or grammatical errors.

## 2024-03-02 NOTE — ED Triage Notes (Signed)
 Pt in ambulatory with reported emesis and generalized abdominal pain onset last night. Pt reports at least 10 episodes, accompanied with diarrhea as well

## 2024-03-03 ENCOUNTER — Encounter (HOSPITAL_BASED_OUTPATIENT_CLINIC_OR_DEPARTMENT_OTHER): Payer: Self-pay

## 2024-03-03 ENCOUNTER — Observation Stay (HOSPITAL_BASED_OUTPATIENT_CLINIC_OR_DEPARTMENT_OTHER)
Admission: EM | Admit: 2024-03-03 | Discharge: 2024-03-04 | Disposition: A | Discharge status: Home / Self Care | Attending: Emergency Medicine | Admitting: Emergency Medicine

## 2024-03-03 ENCOUNTER — Emergency Department (HOSPITAL_BASED_OUTPATIENT_CLINIC_OR_DEPARTMENT_OTHER)
Admission: EM | Admit: 2024-03-03 | Discharge: 2024-03-03 | Disposition: A | Discharge status: Home / Self Care | Source: Home / Self Care | Attending: Emergency Medicine | Admitting: Emergency Medicine

## 2024-03-03 ENCOUNTER — Encounter (HOSPITAL_BASED_OUTPATIENT_CLINIC_OR_DEPARTMENT_OTHER): Payer: Self-pay | Admitting: Emergency Medicine

## 2024-03-03 ENCOUNTER — Other Ambulatory Visit: Payer: Self-pay

## 2024-03-03 DIAGNOSIS — I10 Essential (primary) hypertension: Secondary | ICD-10-CM | POA: Diagnosis not present

## 2024-03-03 DIAGNOSIS — R1084 Generalized abdominal pain: Secondary | ICD-10-CM | POA: Insufficient documentation

## 2024-03-03 DIAGNOSIS — R197 Diarrhea, unspecified: Secondary | ICD-10-CM | POA: Insufficient documentation

## 2024-03-03 DIAGNOSIS — K219 Gastro-esophageal reflux disease without esophagitis: Secondary | ICD-10-CM | POA: Insufficient documentation

## 2024-03-03 DIAGNOSIS — R112 Nausea with vomiting, unspecified: Secondary | ICD-10-CM | POA: Diagnosis not present

## 2024-03-03 DIAGNOSIS — F12929 Cannabis use, unspecified with intoxication, unspecified: Secondary | ICD-10-CM | POA: Insufficient documentation

## 2024-03-03 DIAGNOSIS — Z720 Tobacco use: Secondary | ICD-10-CM | POA: Diagnosis present

## 2024-03-03 DIAGNOSIS — E119 Type 2 diabetes mellitus without complications: Secondary | ICD-10-CM | POA: Diagnosis not present

## 2024-03-03 DIAGNOSIS — Z6841 Body Mass Index (BMI) 40.0 and over, adult: Secondary | ICD-10-CM | POA: Insufficient documentation

## 2024-03-03 DIAGNOSIS — E66813 Obesity, class 3: Secondary | ICD-10-CM | POA: Diagnosis present

## 2024-03-03 DIAGNOSIS — R1116 Cannabis hyperemesis syndrome: Secondary | ICD-10-CM | POA: Diagnosis present

## 2024-03-03 DIAGNOSIS — F17210 Nicotine dependence, cigarettes, uncomplicated: Secondary | ICD-10-CM | POA: Insufficient documentation

## 2024-03-03 DIAGNOSIS — D72829 Elevated white blood cell count, unspecified: Secondary | ICD-10-CM | POA: Insufficient documentation

## 2024-03-03 DIAGNOSIS — E876 Hypokalemia: Secondary | ICD-10-CM | POA: Diagnosis not present

## 2024-03-03 DIAGNOSIS — R1115 Cyclical vomiting syndrome unrelated to migraine: Principal | ICD-10-CM | POA: Diagnosis present

## 2024-03-03 DIAGNOSIS — F12188 Cannabis abuse with other cannabis-induced disorder: Secondary | ICD-10-CM | POA: Diagnosis not present

## 2024-03-03 DIAGNOSIS — F1290 Cannabis use, unspecified, uncomplicated: Secondary | ICD-10-CM | POA: Insufficient documentation

## 2024-03-03 DIAGNOSIS — R110 Nausea: Secondary | ICD-10-CM | POA: Diagnosis not present

## 2024-03-03 LAB — COMPREHENSIVE METABOLIC PANEL WITH GFR
ALT: 16 U/L (ref 0–44)
ALT: 19 U/L (ref 0–44)
AST: 23 U/L (ref 15–41)
AST: 27 U/L (ref 15–41)
Albumin: 4.5 g/dL (ref 3.5–5.0)
Albumin: 4.6 g/dL (ref 3.5–5.0)
Alkaline Phosphatase: 66 U/L (ref 38–126)
Alkaline Phosphatase: 68 U/L (ref 38–126)
Anion gap: 17 — ABNORMAL HIGH (ref 5–15)
Anion gap: 16 — ABNORMAL HIGH (ref 5–15)
BUN: 9 mg/dL (ref 6–20)
BUN: 8 mg/dL (ref 6–20)
CO2: 21 mmol/L — ABNORMAL LOW (ref 22–32)
CO2: 20 mmol/L — ABNORMAL LOW (ref 22–32)
Calcium: 9.4 mg/dL (ref 8.9–10.3)
Calcium: 9.1 mg/dL (ref 8.9–10.3)
Chloride: 96 mmol/L — ABNORMAL LOW (ref 98–111)
Chloride: 98 mmol/L (ref 98–111)
Creatinine, Ser: 0.77 mg/dL (ref 0.44–1.00)
Creatinine, Ser: 0.69 mg/dL (ref 0.44–1.00)
GFR, Estimated: >60 mL/min (ref >60)
GFR, Estimated: >60 mL/min (ref >60)
Glucose, Bld: 103 mg/dL — ABNORMAL HIGH (ref 70–99)
Glucose, Bld: 120 mg/dL — ABNORMAL HIGH (ref 70–99)
Potassium: 2.9 mmol/L — ABNORMAL LOW (ref 3.5–5.1)
Potassium: 3.2 mmol/L — ABNORMAL LOW (ref 3.5–5.1)
Sodium: 133 mmol/L — ABNORMAL LOW (ref 135–145)
Sodium: 134 mmol/L — ABNORMAL LOW (ref 135–145)
Total Bilirubin: 1 mg/dL (ref 0.0–1.2)
Total Bilirubin: 1.1 mg/dL (ref 0.0–1.2)
Total Protein: 7.3 g/dL (ref 6.5–8.1)
Total Protein: 7 g/dL (ref 6.5–8.1)

## 2024-03-03 LAB — CBC WITH DIFFERENTIAL/PLATELET
Abs Immature Granulocytes: 0.06 K/uL (ref 0.00–0.07)
Abs Immature Granulocytes: 0.06 K/uL (ref 0.00–0.07)
Basophils Absolute: 0 K/uL (ref 0.0–0.1)
Basophils Absolute: 0 K/uL (ref 0.0–0.1)
Basophils Relative: 0 %
Basophils Relative: 0 %
Eosinophils Absolute: 0 K/uL (ref 0.0–0.5)
Eosinophils Absolute: 0 K/uL (ref 0.0–0.5)
Eosinophils Relative: 0 %
Eosinophils Relative: 0 %
HCT: 36.1 % (ref 36.0–46.0)
HCT: 35.7 % — ABNORMAL LOW (ref 36.0–46.0)
Hemoglobin: 12.4 g/dL (ref 12.0–15.0)
Hemoglobin: 12.3 g/dL (ref 12.0–15.0)
Immature Granulocytes: 0 %
Immature Granulocytes: 0 %
Lymphocytes Relative: 10 %
Lymphocytes Relative: 12 %
Lymphs Abs: 1.8 K/uL (ref 0.7–4.0)
Lymphs Abs: 1.8 K/uL (ref 0.7–4.0)
MCH: 28.5 pg (ref 26.0–34.0)
MCH: 28.5 pg (ref 26.0–34.0)
MCHC: 34.3 g/dL (ref 30.0–36.0)
MCHC: 34.5 g/dL (ref 30.0–36.0)
MCV: 83 fL (ref 80.0–100.0)
MCV: 82.8 fL (ref 80.0–100.0)
Monocytes Absolute: 1.8 K/uL — ABNORMAL HIGH (ref 0.1–1.0)
Monocytes Absolute: 1.7 K/uL — ABNORMAL HIGH (ref 0.1–1.0)
Monocytes Relative: 10 %
Monocytes Relative: 12 %
Neutro Abs: 14 K/uL — ABNORMAL HIGH (ref 1.7–7.7)
Neutro Abs: 11.1 K/uL — ABNORMAL HIGH (ref 1.7–7.7)
Neutrophils Relative %: 80 %
Neutrophils Relative %: 76 %
Platelets: 316 K/uL (ref 150–400)
Platelets: 300 K/uL (ref 150–400)
RBC: 4.35 MIL/uL (ref 3.87–5.11)
RBC: 4.31 MIL/uL (ref 3.87–5.11)
RDW: 13.4 % (ref 11.5–15.5)
RDW: 13.5 % (ref 11.5–15.5)
WBC: 17.6 K/uL — ABNORMAL HIGH (ref 4.0–10.5)
WBC: 14.8 K/uL — ABNORMAL HIGH (ref 4.0–10.5)
nRBC: 0 % (ref 0.0–0.2)
nRBC: 0 % (ref 0.0–0.2)

## 2024-03-03 LAB — I-STAT CHEM 8, ED
BUN: 6 mg/dL (ref 6–20)
Calcium, Ion: 1.08 mmol/L — ABNORMAL LOW (ref 1.15–1.40)
Chloride: 102 mmol/L (ref 98–111)
Creatinine, Ser: 0.6 mg/dL (ref 0.44–1.00)
Glucose, Bld: 116 mg/dL — ABNORMAL HIGH (ref 70–99)
HCT: 41 % (ref 36.0–46.0)
Hemoglobin: 13.9 g/dL (ref 12.0–15.0)
Potassium: 3.4 mmol/L — ABNORMAL LOW (ref 3.5–5.1)
Sodium: 134 mmol/L — ABNORMAL LOW (ref 135–145)
TCO2: 20 mmol/L — ABNORMAL LOW (ref 22–32)

## 2024-03-03 LAB — MAGNESIUM
Magnesium: 1.9 mg/dL (ref 1.7–2.4)
Magnesium: 1.9 mg/dL (ref 1.7–2.4)

## 2024-03-03 LAB — HCG, SERUM, QUALITATIVE: Preg, Serum: NEGATIVE

## 2024-03-03 LAB — LIPASE, BLOOD: Lipase: 19 U/L (ref 11–51)

## 2024-03-03 MED ORDER — ONDANSETRON HCL 4 MG/2ML IJ SOLN
4.0000 mg | Freq: Once | INTRAMUSCULAR | Status: AC
  Administered 2024-03-03: 4 mg via INTRAVENOUS
  Filled 2024-03-03: qty 2

## 2024-03-03 MED ORDER — SODIUM CHLORIDE 0.9 % IV BOLUS
1000.0000 mL | Freq: Once | INTRAVENOUS | Status: AC
  Administered 2024-03-03: 1000 mL via INTRAVENOUS

## 2024-03-03 MED ORDER — HALOPERIDOL LACTATE 5 MG/ML IJ SOLN
2.0000 mg | Freq: Once | INTRAMUSCULAR | Status: AC
  Administered 2024-03-03: 2 mg via INTRAVENOUS
  Filled 2024-03-03: qty 1

## 2024-03-03 MED ORDER — HALOPERIDOL LACTATE 5 MG/ML IJ SOLN
2.0000 mg | Freq: Once | INTRAMUSCULAR | Status: AC
  Administered 2024-03-03: 2 mg via INTRAMUSCULAR
  Filled 2024-03-03: qty 1

## 2024-03-03 MED ORDER — POTASSIUM CHLORIDE 10 MEQ/100ML IV SOLN
10.0000 meq | Freq: Once | INTRAVENOUS | Status: AC
  Administered 2024-03-03: 10 meq via INTRAVENOUS
  Filled 2024-03-03: qty 100

## 2024-03-03 MED ORDER — SODIUM CHLORIDE 0.9 % IV SOLN: INTRAVENOUS | Status: DC | PRN

## 2024-03-03 MED ORDER — ONDANSETRON HCL 4 MG/2ML IJ SOLN
4.0000 mg | Freq: Four times a day (QID) | INTRAMUSCULAR | Status: DC | PRN
Start: 2024-03-03 — End: 2024-03-04
  Administered 2024-03-04 (×2): 4 mg via INTRAVENOUS
  Filled 2024-03-03 (×3): qty 2

## 2024-03-03 MED ORDER — DIPHENHYDRAMINE HCL 50 MG/ML IJ SOLN
50.0000 mg | Freq: Once | INTRAMUSCULAR | Status: AC
  Administered 2024-03-03: 50 mg via INTRAVENOUS
  Filled 2024-03-03: qty 1

## 2024-03-03 MED ORDER — KETOROLAC TROMETHAMINE 30 MG/ML IJ SOLN
30.0000 mg | Freq: Once | INTRAMUSCULAR | Status: AC
  Administered 2024-03-03: 30 mg via INTRAVENOUS
  Filled 2024-03-03: qty 1

## 2024-03-03 MED ORDER — PROCHLORPERAZINE EDISYLATE 10 MG/2ML IJ SOLN
10.0000 mg | Freq: Once | INTRAMUSCULAR | Status: AC
  Administered 2024-03-03: 10 mg via INTRAVENOUS
  Filled 2024-03-03: qty 2

## 2024-03-03 MED ORDER — POTASSIUM CHLORIDE CRYS ER 10 MEQ PO TBCR: 20.0000 meq | EXTENDED_RELEASE_TABLET | Freq: Every day | ORAL | 0 refills | Status: DC

## 2024-03-03 MED ORDER — LORAZEPAM 2 MG/ML IJ SOLN
1.0000 mg | Freq: Once | INTRAMUSCULAR | Status: AC
  Administered 2024-03-03: 1 mg via INTRAVENOUS
  Filled 2024-03-03: qty 1

## 2024-03-03 MED ORDER — SODIUM CHLORIDE 0.9 % IV SOLN: Freq: Once | INTRAVENOUS | Status: AC

## 2024-03-03 MED ORDER — POTASSIUM CHLORIDE 10 MEQ/100ML IV SOLN
10.0000 meq | INTRAVENOUS | Status: AC
  Administered 2024-03-03 (×2): 10 meq via INTRAVENOUS
  Filled 2024-03-03 (×2): qty 100

## 2024-03-03 MED ORDER — SODIUM CHLORIDE 0.9 % IV SOLN
25.0000 mg | Freq: Four times a day (QID) | INTRAVENOUS | Status: DC | PRN
  Administered 2024-03-04: 25 mg via INTRAVENOUS
  Filled 2024-03-03 (×2): qty 1

## 2024-03-03 MED ORDER — PROMETHAZINE HCL 25 MG RE SUPP: 25.0000 mg | Freq: Four times a day (QID) | RECTAL | Status: DC | PRN

## 2024-03-03 NOTE — ED Notes (Signed)
 Pt refusing to wear vital monitoring equipment. RN explained to pt this is used to monitor vitals. Pt stated understanding and continued refusal

## 2024-03-03 NOTE — ED Triage Notes (Signed)
 Seen at [facility] ED this morning. Generalized abd pain, NVD for 3 days.

## 2024-03-03 NOTE — ED Provider Notes (Signed)
 [place] EMERGENCY DEPARTMENT AT [facility] Provider Note   CSN: [id number] Arrival date & time: 03/02/24  2219     Patient presents with: Emesis   Christine Trujillo is a 28 y.o. female with known cannabis hyperemesis syndrome who presents for her third ED visit today with nausea vomiting and overall abdominal pain.  She was treated previously with Haldol  with improvement of her symptoms, and also treated with droperidol  with improvement in her symptoms.  Has multiple antiemetic medications at home, unclear compliance.  Does continue to use marijuana daily to help me eat because I do not have much of an appetite.  No fevers or chills.  States symptoms were unchanged from her prior ED visit, her symptoms just recurred after medications administered at [facility] and wore off.  In addition to the above listed history she has history of generalized anxiety disorder, anemia.  She is not anticoagulated.   HPI     Prior to Admission medications   Medication Sig Start Date End Date Taking? Authorizing Provider  amitriptyline  (ELAVIL ) 10 MG tablet Take 1 tablet (10 mg total) by mouth at bedtime. 01/23/24   Frann Mabel Mt, DO  amLODipine  (NORVASC ) 5 MG tablet Take 1 tablet (5 mg total) by mouth daily. 01/07/24   Barbarann Nest, MD  capsaicin  (ZOSTRIX) 0.025 % cream Apply topically 2 (two) times daily. 01/06/24   Barbarann Nest, MD  Capsaicin -Menthol -Methyl Sal (CAPSAICIN -METHYL SAL-MENTHOL ) 0.025-1-12 % CREA Apply 1 Application topically 3 (three) times daily as needed. 03/02/24   Elnor Jayson LABOR, DO  famotidine  (PEPCID ) 20 MG tablet Take 1 tablet (20 mg total) by mouth 2 (two) times daily. 07/11/23   Frann Mabel Mt, DO  hydrOXYzine  (ATARAX ) 25 MG tablet Take 1 tablet (25 mg total) by mouth 3 (three) times daily as needed for anxiety (refractory n/v). 01/23/24   Frann Mabel Mt, DO  ibuprofen  (ADVIL ) 800 MG tablet Take 1 tablet (800 mg total) by  mouth 3 (three) times daily. 02/24/24   Desiderio Chew, PA-C  metoCLOPramide  (REGLAN ) 10 MG tablet Take 0.5 tablets (5 mg total) by mouth 3 (three) times daily. 01/06/24 01/05/25  Barbarann Nest, MD  Multiple Vitamins-Minerals (MULTIVITAMIN WITH MINERALS) tablet Take 1 tablet by mouth daily.    [provider]  ondansetron  (ZOFRAN ) 4 MG tablet Take 1 tablet (4 mg total) by mouth every 6 (six) hours. 02/17/24   Nivia Colon, PA-C  ondansetron  (ZOFRAN -ODT) 4 MG disintegrating tablet Take 1 tablet (4 mg total) by mouth every 8 (eight) hours as needed for nausea or vomiting. Patient taking differently: Take 4 mg by mouth every 8 (eight) hours as needed for nausea or vomiting (dissolve orally). 10/08/23   Roselyn Carlin NOVAK, MD  oxyCODONE -acetaminophen  (PERCOCET) 5-325 MG tablet Take 1-2 tablets by mouth every 6 (six) hours as needed. 02/20/24   Geroldine Berg, MD  pantoprazole  (PROTONIX ) 40 MG tablet Take 1 tablet (40 mg total) by mouth daily. 10/12/23   Rai, Nydia POUR, MD  prochlorperazine  (COMPAZINE ) 10 MG tablet Take 1 tablet (10 mg total) by mouth 2 (two) times daily as needed for nausea or vomiting. 02/19/24   Neysa Caron PARAS, DO  promethazine  (PHENERGAN ) 25 MG suppository Place 1 suppository (25 mg total) rectally every 6 (six) hours as needed for refractory nausea / vomiting. 03/02/24   Elnor Jayson LABOR, DO  Vitamin D , Ergocalciferol , (DRISDOL ) 1.25 MG (50000 UNIT) CAPS capsule Take 1 capsule (50,000 Units total) by mouth every 7 (seven) days. 01/24/24  Frann Mabel Mt, DO    Allergies: Patient has no known allergies.    Review of Systems  Gastrointestinal:  Positive for abdominal pain, nausea and vomiting.    Updated Vital Signs BP (!) 141/94 (BP Location: Left Arm)   Pulse 71   Temp 98.4 F (36.9 C) (Oral)   Resp 17   Ht 5' 3 (1.6 m)   Wt 53.5 kg   LMP 02/09/2024 (Exact Date)   SpO2 100%   BMI 20.89 kg/m   Physical Exam Vitals and nursing note reviewed.   Constitutional:      Appearance: She is obese. She is not ill-appearing or toxic-appearing.  HENT:     Head: Normocephalic and atraumatic.     Mouth/Throat:     Mouth: Mucous membranes are moist.     Pharynx: No oropharyngeal exudate or posterior oropharyngeal erythema.  Eyes:     General:        Right eye: No discharge.        Left eye: No discharge.     Conjunctiva/sclera: Conjunctivae normal.  Cardiovascular:     Rate and Rhythm: Normal rate and regular rhythm.     Pulses: Normal pulses.     Heart sounds: Normal heart sounds. No murmur heard. Pulmonary:     Effort: Pulmonary effort is normal. No respiratory distress.     Breath sounds: Normal breath sounds. No wheezing or rales.  Abdominal:     General: Bowel sounds are normal. There is no distension.     Palpations: Abdomen is soft.     Tenderness: There is no abdominal tenderness. There is no guarding or rebound.     Comments: Patient dry heaving into emesis bag to my bedside without any emesis.  Musculoskeletal:        General: No deformity.     Cervical back: Neck supple.  Skin:    General: Skin is warm and dry.     Capillary Refill: Capillary refill takes less than 2 seconds.  Neurological:     General: No focal deficit present.     Mental Status: She is alert and oriented to person, place, and time. Mental status is at baseline.  Psychiatric:        Mood and Affect: Mood normal.     (all labs ordered are listed, but only abnormal results are displayed) Labs Reviewed  I-STAT CHEM 8, ED - Abnormal; Notable for the following components:      Result Value   Sodium 134 (*)    Potassium 3.4 (*)    Glucose, Bld 116 (*)    Calcium, Ion 1.08 (*)    TCO2 20 (*)    All other components within normal limits    EKG: None  Radiology: No results found.   Procedures   Medications Ordered in the ED  haloperidol  lactate (HALDOL ) injection 2 mg (2 mg Intramuscular Given 03/03/24 0318)                                     Medical Decision Making 28 year old female with well-documented history of cannabinoid hyperemesis syndrome presents with same.  Hypertensive on intake but vital signs otherwise normal.  Cardiopulmonary and abdominal exams are benign.  Patient overall well-appearing though intermittently dry heaving.  Amount and/or Complexity of Data Reviewed Labs:     Details:  Labs earlier in the day with CBC with mild leukocytosis, CMP with hypokalemia  3.1, with elevated gap to 16 this afternoon mag was normal.  This evening Chem-8 with potassium 3.4, no evidence of AKI.    Risk Prescription drug management.   Patient administered dose of Haldol  with improvement in her symptoms, tolerating p.o.  Patient is been given multiple prescriptions for capsaicin  cream and Phenergan  suppositories earlier in the day at her preceding ED visits.  Encouraged her to pick these up from the pharmacy, extensive discussion regarding the role of marijuana cessation and treatment of her cannabis hyperemesis syndrome.  Will also recommend close outpatient follow-up with gastroenterology.  Clinical concern for emergent underlying condition that would warrant further ED workup or inpatient management is exceedingly low.  Arli  voiced understanding of her medical evaluation and treatment plan. Each of their questions answered to their expressed satisfaction.  Return precautions were given.  Patient is well-appearing, stable, and was discharged in good condition.  This chart was dictated using voice recognition software, Dragon. Despite the best efforts of this provider to proofread and correct errors, errors may still occur which can change documentation meaning.      Final diagnoses:  Cannabinoid hyperemesis syndrome    ED Discharge Orders     None          Bobette Pleasant JONELLE DEVONNA 03/03/24 0724    Griselda Norris, MD 03/03/24 2333

## 2024-03-03 NOTE — ED Triage Notes (Signed)
 Pt returns for continued NV

## 2024-03-03 NOTE — ED Notes (Signed)
 Patient d/c with home care instructions.

## 2024-03-03 NOTE — ED Notes (Signed)
 Pt in bed sleeping at this time. Respirations equal and unlabored.

## 2024-03-03 NOTE — Discharge Instructions (Signed)
 You are seen in the ER today for your hyperemesis syndrome.  This was treated with Haldol  with improvement in your symptoms.  It is strongly encouraged that you work towards cessation from your marijuana use as this will exacerbate your condition.  You may use the prescribed medications for your hyperemesis that were previously sent to your pharmacy at your first ED visit yesterday.  This was sent to the [facility] on [address] in [place].  Return to the ER with any severe symptoms.

## 2024-03-03 NOTE — ED Notes (Signed)
 Rate of NS increased to 150 ml/hr due to vein irritation from potassium. EDP verbal order

## 2024-03-03 NOTE — Plan of Care (Addendum)
 [facility] to [facility] or [facility] telemetry bed transfer:   28 year old female history of cyclical vomiting syndrome and cannabis hyperemesis syndrome presented to emergency department with complaining of generalized abdominal pain with associated nausea vomiting diarrhea for last 3 days.  Patient has been seen emergency department almost 3 times in last 24 hours.  Patient was advised to be admitted however she left the emergency department.  At presentation to ED patient is hemodynamically stable except tachypneic 38. Lab work, CBC showing leukocytosis 14 otherwise unremarkable.  CMP showing low potassium 3.2, low sodium 133, low bicarb 21.  Pregnancy test negative.  Normal lipase and mag level.  EKG showed normal sinus rhythm heart rate 58.  In the ED patient has been given Compazine , Benadryl , Haldol , Toradol , Zofran  and IV potassium 10 mEq.   Hospitalist consulted for further evaluation management of intractable nausea vomiting in the setting of cyclical vomiting syndrome from marijuana use and hypokalemia.   [facility] will assume care on arrival to accepting facility. Until arrival, care as per EDP. However, [facility] available 24/7 for questions and assistance. Check [email or web address] for on-call coverage. Nursing staff, please call [facility] Admits & Consults System-Wide number under Amion on patient's arrival so appropriate admitting provider can evaluate the pt.   Author: Olamide Lahaie, MD  [facility]

## 2024-03-03 NOTE — ED Provider Notes (Signed)
 [place] [facility] Provider Note   CSN: [id number] Arrival date & time: 03/03/24  2018     Patient presents with: Emesis   Leonard Hendler is a 28 y.o. female hx of cannabis hyperemesis, here presenting with vomiting. Patient has been using marijuana. Patient was seen in the ED 3 times yesterday for vomiting.  Patient also came back this morning for vomiting.  Patient apparently did not want to get admitted at that time.  She states that the medicine keeps on wearing off and she keeps on vomiting when it wears off.  She has tried Phenergan  suppository with no relief.  Patient states that now she is agreeable to get admitted.  She states that she has not used marijuana since yesterday.  Her UDS was positive for marijuana yesterday.   The history is provided by the patient.       Prior to Admission medications   Medication Sig Start Date End Date Taking? Authorizing Provider  amitriptyline  (ELAVIL ) 10 MG tablet Take 1 tablet (10 mg total) by mouth at bedtime. 01/23/24   Frann Mabel Mt, DO  amLODipine  (NORVASC ) 5 MG tablet Take 1 tablet (5 mg total) by mouth daily. 01/07/24   Barbarann Nest, MD  capsaicin  (ZOSTRIX) 0.025 % cream Apply topically 2 (two) times daily. 01/06/24   Barbarann Nest, MD  Capsaicin -Menthol -Methyl Sal (CAPSAICIN -METHYL SAL-MENTHOL ) 0.025-1-12 % CREA Apply 1 Application topically 3 (three) times daily as needed. 03/02/24   Elnor Jayson LABOR, DO  famotidine  (PEPCID ) 20 MG tablet Take 1 tablet (20 mg total) by mouth 2 (two) times daily. 07/11/23   Frann Mabel Mt, DO  hydrOXYzine  (ATARAX ) 25 MG tablet Take 1 tablet (25 mg total) by mouth 3 (three) times daily as needed for anxiety (refractory n/v). 01/23/24   Frann Mabel Mt, DO  ibuprofen  (ADVIL ) 800 MG tablet Take 1 tablet (800 mg total) by mouth 3 (three) times daily. 02/24/24   Desiderio Chew, PA-C  metoCLOPramide  (REGLAN ) 10 MG tablet Take 0.5 tablets (5  mg total) by mouth 3 (three) times daily. 01/06/24 01/05/25  Barbarann Nest, MD  Multiple Vitamins-Minerals (MULTIVITAMIN WITH MINERALS) tablet Take 1 tablet by mouth daily.    [provider]  ondansetron  (ZOFRAN ) 4 MG tablet Take 1 tablet (4 mg total) by mouth every 6 (six) hours. 02/17/24   Nivia Colon, PA-C  ondansetron  (ZOFRAN -ODT) 4 MG disintegrating tablet Take 1 tablet (4 mg total) by mouth every 8 (eight) hours as needed for nausea or vomiting. Patient taking differently: Take 4 mg by mouth every 8 (eight) hours as needed for nausea or vomiting (dissolve orally). 10/08/23   Roselyn Carlin NOVAK, MD  oxyCODONE -acetaminophen  (PERCOCET) 5-325 MG tablet Take 1-2 tablets by mouth every 6 (six) hours as needed. 02/20/24   Geroldine Berg, MD  pantoprazole  (PROTONIX ) 40 MG tablet Take 1 tablet (40 mg total) by mouth daily. 10/12/23   Rai, Nydia POUR, MD  potassium chloride  (KLOR-CON  M) 10 MEQ tablet Take 2 tablets (20 mEq total) by mouth daily. 03/03/24   Horton, Roxie HERO, DO  prochlorperazine  (COMPAZINE ) 10 MG tablet Take 1 tablet (10 mg total) by mouth 2 (two) times daily as needed for nausea or vomiting. 02/19/24   Neysa Caron PARAS, DO  promethazine  (PHENERGAN ) 25 MG suppository Place 1 suppository (25 mg total) rectally every 6 (six) hours as needed for refractory nausea / vomiting. 03/02/24   Elnor Jayson LABOR, DO  Vitamin D , Ergocalciferol , (DRISDOL ) 1.25 MG (50000 UNIT) CAPS capsule  Take 1 capsule (50,000 Units total) by mouth every 7 (seven) days. 01/24/24   Frann Mabel Mt, DO    Allergies: Patient has no known allergies.    Review of Systems  Gastrointestinal:  Positive for vomiting.  All other systems reviewed and are negative.   Updated Vital Signs BP (!) 143/80 (BP Location: Right Arm)   Pulse 76   Temp 99.2 F (37.3 C) (Oral)   Resp (!) 38   Ht 5' 3 (1.6 m)   Wt 53.5 kg   LMP 02/09/2024 (Exact Date)   SpO2 100%   BMI 20.89 kg/m   Physical Exam Vitals and nursing  note reviewed.  Constitutional:      Comments: Dehydrated  HENT:     Head: Normocephalic.     Nose: Nose normal.     Mouth/Throat:     Mouth: Mucous membranes are dry.  Eyes:     Extraocular Movements: Extraocular movements intact.     Pupils: Pupils are equal, round, and reactive to light.  Cardiovascular:     Rate and Rhythm: Normal rate and regular rhythm.     Pulses: Normal pulses.  Pulmonary:     Effort: Pulmonary effort is normal.     Breath sounds: Normal breath sounds.  Abdominal:     General: Abdomen is flat.     Palpations: Abdomen is soft.  Musculoskeletal:        General: Normal range of motion.     Cervical back: Normal range of motion and neck supple.  Skin:    General: Skin is warm.     Capillary Refill: Capillary refill takes less than 2 seconds.  Neurological:     General: No focal deficit present.     Mental Status: She is alert and oriented to person, place, and time.  Psychiatric:        Mood and Affect: Mood normal.        Behavior: Behavior normal.     (all labs ordered are listed, but only abnormal results are displayed) Labs Reviewed  CBC WITH DIFFERENTIAL/PLATELET - Abnormal; Notable for the following components:      Result Value   WBC 14.8 (*)    HCT 35.7 (*)    Neutro Abs 11.1 (*)    Monocytes Absolute 1.7 (*)    All other components within normal limits  COMPREHENSIVE METABOLIC PANEL WITH GFR  MAGNESIUM     EKG: None  Radiology: No results found.   Procedures   Angiocath insertion Performed by: Alm VEAR Cave  Consent: Verbal consent obtained. Risks and benefits: risks, benefits and alternatives were discussed Time out: Immediately prior to procedure a time out was called to verify the correct patient, procedure, equipment, support staff and site/side marked as required.  Preparation: Patient was prepped and draped in the usual sterile fashion.  Vein Location: R antecube  Ultrasound Guided  Gauge: 20 long   Normal  blood return and flush without difficulty Patient tolerance: Patient tolerated the procedure well with no immediate complications.    Medications Ordered in the ED  sodium chloride  0.9 % bolus 1,000 mL (1,000 mLs Intravenous New Bag/Given 03/03/24 2112)  prochlorperazine  (COMPAZINE ) injection 10 mg (10 mg Intravenous Given 03/03/24 2120)  diphenhydrAMINE  (BENADRYL ) injection 50 mg (50 mg Intravenous Given 03/03/24 2118)  ketorolac  (TORADOL ) 30 MG/ML injection 30 mg (30 mg Intravenous Given 03/03/24 2118)  haloperidol  lactate (HALDOL ) injection 2 mg (2 mg Intravenous Given 03/03/24 2126)  ondansetron  (ZOFRAN ) injection 4 mg (4 mg  Intravenous Given 03/03/24 2117)                                    Medical Decision Making Kimbree Casanas is a 28 y.o. female here presenting with vomiting.  Patient likely has cannabis hyperemesis.  Plan to get CBC CMP and magnesium  level.  Will hydrate patient and likely need admission for intractable vomiting.   10:20 PM Potassium is 3.2.  Given IV potassium and multiple rounds of nausea medicine.  Patient will be admitted for intractable vomiting.  Problems Addressed: Cannabis hyperemesis syndrome: acute illness or injury Nausea and vomiting, unspecified vomiting type: acute illness or injury  Amount and/or Complexity of Data Reviewed Labs: ordered. Decision-making details documented in ED Course. ECG/medicine tests: ordered and independent interpretation performed. Decision-making details documented in ED Course.  Risk Prescription drug management.     Final diagnoses:  None    ED Discharge Orders     None          Patt Alm Macho, MD 03/03/24 2221

## 2024-03-03 NOTE — ED Provider Notes (Signed)
 [place] [facility] Provider Note   CSN: [id number] Arrival date & time: 03/03/24  9061     Patient presents with: Abdominal Pain   Christine Trujillo is a 28 y.o. female.   HPI   28 year old female with past medical history of cyclic vomiting, cannabis hyperemesis presents emergency department ongoing generalized abdominal pain, nausea/vomiting/diarrhea.  This has been ongoing for the past 3 days.  Was seen at our outside emergency rooms 3 times since yesterday.  Has been prescribed capsaicin  cream and suppositories which she has not filled/used.  Patient states she has needed to be admitted with the symptoms before.  It is thought to be secondary to daily cannabis use.  Patient continues with daily cannabis use.  Denies any other acute symptoms like fever, chest pain/difficulty breathing, blood in the emesis.  Prior to Admission medications   Medication Sig Start Date End Date Taking? Authorizing Provider  amitriptyline  (ELAVIL ) 10 MG tablet Take 1 tablet (10 mg total) by mouth at bedtime. 01/23/24   Frann Mabel Mt, DO  amLODipine  (NORVASC ) 5 MG tablet Take 1 tablet (5 mg total) by mouth daily. 01/07/24   Barbarann Nest, MD  capsaicin  (ZOSTRIX) 0.025 % cream Apply topically 2 (two) times daily. 01/06/24   Barbarann Nest, MD  Capsaicin -Menthol -Methyl Sal (CAPSAICIN -METHYL SAL-MENTHOL ) 0.025-1-12 % CREA Apply 1 Application topically 3 (three) times daily as needed. 03/02/24   Elnor Jayson LABOR, DO  famotidine  (PEPCID ) 20 MG tablet Take 1 tablet (20 mg total) by mouth 2 (two) times daily. 07/11/23   Frann Mabel Mt, DO  hydrOXYzine  (ATARAX ) 25 MG tablet Take 1 tablet (25 mg total) by mouth 3 (three) times daily as needed for anxiety (refractory n/v). 01/23/24   Frann Mabel Mt, DO  ibuprofen  (ADVIL ) 800 MG tablet Take 1 tablet (800 mg total) by mouth 3 (three) times daily. 02/24/24   Desiderio Chew, PA-C  metoCLOPramide  (REGLAN ) 10  MG tablet Take 0.5 tablets (5 mg total) by mouth 3 (three) times daily. 01/06/24 01/05/25  Barbarann Nest, MD  Multiple Vitamins-Minerals (MULTIVITAMIN WITH MINERALS) tablet Take 1 tablet by mouth daily.    [provider]  ondansetron  (ZOFRAN ) 4 MG tablet Take 1 tablet (4 mg total) by mouth every 6 (six) hours. 02/17/24   Nivia Colon, PA-C  ondansetron  (ZOFRAN -ODT) 4 MG disintegrating tablet Take 1 tablet (4 mg total) by mouth every 8 (eight) hours as needed for nausea or vomiting. Patient taking differently: Take 4 mg by mouth every 8 (eight) hours as needed for nausea or vomiting (dissolve orally). 10/08/23   Roselyn Carlin NOVAK, MD  oxyCODONE -acetaminophen  (PERCOCET) 5-325 MG tablet Take 1-2 tablets by mouth every 6 (six) hours as needed. 02/20/24   Geroldine Berg, MD  pantoprazole  (PROTONIX ) 40 MG tablet Take 1 tablet (40 mg total) by mouth daily. 10/12/23   Rai, Nydia POUR, MD  prochlorperazine  (COMPAZINE ) 10 MG tablet Take 1 tablet (10 mg total) by mouth 2 (two) times daily as needed for nausea or vomiting. 02/19/24   Neysa Caron PARAS, DO  promethazine  (PHENERGAN ) 25 MG suppository Place 1 suppository (25 mg total) rectally every 6 (six) hours as needed for refractory nausea / vomiting. 03/02/24   Elnor Jayson LABOR, DO  Vitamin D , Ergocalciferol , (DRISDOL ) 1.25 MG (50000 UNIT) CAPS capsule Take 1 capsule (50,000 Units total) by mouth every 7 (seven) days. 01/24/24   Frann Mabel Mt, DO    Allergies: Patient has no known allergies.    Review of Systems  Constitutional:  Positive for appetite change and chills. Negative for fever.  Respiratory:  Negative for shortness of breath.   Cardiovascular:  Negative for chest pain.  Gastrointestinal:  Positive for abdominal pain, nausea and vomiting. Negative for blood in stool and diarrhea.  Skin:  Negative for rash.  Neurological:  Negative for headaches.    Updated Vital Signs BP (!) 152/78   Pulse 75   Temp 98 F (36.7 C) (Oral)    Resp 18   LMP 02/09/2024 (Exact Date)   SpO2 100%   Physical Exam Vitals and nursing note reviewed.  Constitutional:      General: She is not in acute distress.    Appearance: Normal appearance.     Comments: Actively dry heaving/vomiting into an emesis bag  HENT:     Head: Normocephalic.     Mouth/Throat:     Mouth: Mucous membranes are moist.  Cardiovascular:     Rate and Rhythm: Normal rate.  Pulmonary:     Effort: Pulmonary effort is normal. No respiratory distress.  Abdominal:     General: Bowel sounds are normal. There is no distension.     Palpations: Abdomen is soft.     Tenderness: There is generalized abdominal tenderness. There is no guarding or rebound.  Skin:    General: Skin is warm.  Neurological:     Mental Status: She is alert and oriented to person, place, and time. Mental status is at baseline.  Psychiatric:        Mood and Affect: Mood normal.     (all labs ordered are listed, but only abnormal results are displayed) Labs Reviewed  CBC WITH DIFFERENTIAL/PLATELET - Abnormal; Notable for the following components:      Result Value   WBC 17.6 (*)    Neutro Abs 14.0 (*)    Monocytes Absolute 1.8 (*)    All other components within normal limits  COMPREHENSIVE METABOLIC PANEL WITH GFR  HCG, SERUM, QUALITATIVE  LIPASE, BLOOD  MAGNESIUM     EKG: None  Radiology: No results found.   Procedures   Medications Ordered in the ED  promethazine  (PHENERGAN ) suppository 25 mg (has no administration in time range)  haloperidol  lactate (HALDOL ) injection 2 mg (2 mg Intravenous Given 03/03/24 1031)  sodium chloride  0.9 % bolus 1,000 mL (1,000 mLs Intravenous New Bag/Given 03/03/24 1030)                                    Medical Decision Making Amount and/or Complexity of Data Reviewed Labs: ordered.  Risk Prescription drug management.   28 year old female with medical history concerning for cannabis hyperemesis syndrome presents emergency  department after return of symptoms.  She did not fill her prescription for capsaicin  cream or suppositories.  She [place] presents with ongoing nausea/vomiting.  Was seen in the ER earlier, given IV therapy with improvement.  Patient is afebrile, vitals are normal.  On my initial evaluation patient is vomiting into an emesis bag.  Blood work shows a leukocytosis, slightly up trended from yesterday.  Otherwise abdominal labs are normal, there is mild hypokalemia at 2.9 with a normal magnesium .  This was replaced IV.  Lipase is normal, pregnancy test is negative.  After IV medicine patient feels significantly improved.  Abdominal exam is benign.  I do not feel she warrants emergent imaging at this time.  Patient was monitored, able to tolerate p.o., requesting discharge home.  Encouraged her to fill her prescription medications.  She states that she has abstained from marijuana for the past 2 days, encouraged to this as well in regards to concern for cannabis hyperemesis.  She understands and will follow-up patient.  Patient at this time appears safe and stable for discharge and close outpatient follow up. Discharge plan and strict return to ED precautions discussed, patient verbalizes understanding and agreement.     Final diagnoses:  None    ED Discharge Orders     None          Bari Roxie HERO, DO 03/03/24 1426

## 2024-03-03 NOTE — Discharge Instructions (Signed)
 You have been seen and discharged from the emergency department.  Your blood work showed low potassium that was replaced through the IV.  You were given medicine through the IV.  It is important to fill your capsaicin  cream and use suppositories as directed.  You may also try the Bentyl  medication for relief.  Follow-up with gastroenterology.  Continue to abstain from marijuana as daily use is most likely the source of your symptoms.  Follow-up with your primary provider for further evaluation and further care. Take home medications as prescribed. If you have any worsening symptoms or further concerns for your health please return to an emergency department for further evaluation.

## 2024-03-04 ENCOUNTER — Other Ambulatory Visit (HOSPITAL_COMMUNITY): Payer: Self-pay

## 2024-03-04 DIAGNOSIS — I10 Essential (primary) hypertension: Secondary | ICD-10-CM | POA: Diagnosis present

## 2024-03-04 DIAGNOSIS — R1115 Cyclical vomiting syndrome unrelated to migraine: Principal | ICD-10-CM

## 2024-03-04 DIAGNOSIS — R1116 Cannabis hyperemesis syndrome: Secondary | ICD-10-CM | POA: Diagnosis present

## 2024-03-04 LAB — HIV ANTIBODY (ROUTINE TESTING W REFLEX): HIV Screen 4th Generation wRfx: NONREACTIVE

## 2024-03-04 MED ORDER — HYDROXYZINE HCL 25 MG PO TABS
25.0000 mg | ORAL_TABLET | Freq: Three times a day (TID) | ORAL | Status: DC | PRN
  Administered 2024-03-04: 25 mg via ORAL
  Filled 2024-03-04: qty 1

## 2024-03-04 MED ORDER — CAPSAICIN 0.075 % EX CREA
TOPICAL_CREAM | Freq: Two times a day (BID) | CUTANEOUS | Status: DC
  Filled 2024-03-04: qty 57

## 2024-03-04 MED ORDER — GUAIFENESIN-DM 100-10 MG/5ML PO SYRP
5.0000 mL | ORAL_SOLUTION | ORAL | Status: DC | PRN
  Filled 2024-03-04: qty 5

## 2024-03-04 MED ORDER — POTASSIUM CHLORIDE CRYS ER 20 MEQ PO TBCR
20.0000 meq | EXTENDED_RELEASE_TABLET | Freq: Every day | ORAL | Status: DC
  Administered 2024-03-04: 20 meq via ORAL
  Filled 2024-03-04: qty 1

## 2024-03-04 MED ORDER — PANTOPRAZOLE SODIUM 40 MG PO TBEC
40.0000 mg | DELAYED_RELEASE_TABLET | Freq: Every day | ORAL | Status: DC
  Administered 2024-03-04: 40 mg via ORAL

## 2024-03-04 MED ORDER — AMLODIPINE BESYLATE 5 MG PO TABS
5.0000 mg | ORAL_TABLET | Freq: Every day | ORAL | Status: DC
  Administered 2024-03-04: 5 mg via ORAL

## 2024-03-04 MED ORDER — LORATADINE 10 MG PO TABS
10.0000 mg | ORAL_TABLET | Freq: Every day | ORAL | Status: DC
  Administered 2024-03-04: 10 mg via ORAL
  Filled 2024-03-04: qty 1

## 2024-03-04 MED ORDER — CAPSAICIN 0.075 % EX CREA
TOPICAL_CREAM | Freq: Two times a day (BID) | CUTANEOUS | 0 refills | Status: AC
  Filled 2024-03-04: qty 28.3, fill #0

## 2024-03-04 MED ORDER — FAMOTIDINE 20 MG PO TABS: 20.0000 mg | ORAL_TABLET | Freq: Two times a day (BID) | ORAL | 0 refills | Status: AC

## 2024-03-04 MED ORDER — PROMETHAZINE HCL 12.5 MG RE SUPP: 12.5000 mg | Freq: Four times a day (QID) | RECTAL | Status: DC | PRN

## 2024-03-04 MED ORDER — PANTOPRAZOLE SODIUM 40 MG PO TBEC: 40.0000 mg | DELAYED_RELEASE_TABLET | Freq: Every day | ORAL | 0 refills | Status: AC

## 2024-03-04 MED ORDER — ACETAMINOPHEN 325 MG PO TABS: 650.0000 mg | ORAL_TABLET | Freq: Four times a day (QID) | ORAL | Status: AC | PRN

## 2024-03-04 MED ORDER — POTASSIUM CHLORIDE 10 MEQ/100ML IV SOLN
10.0000 meq | INTRAVENOUS | Status: AC
  Administered 2024-03-04 (×5): 10 meq via INTRAVENOUS
  Filled 2024-03-04 (×4): qty 100

## 2024-03-04 MED ORDER — AMITRIPTYLINE HCL 25 MG PO TABS: 25.0000 mg | ORAL_TABLET | Freq: Every day | ORAL | Status: DC

## 2024-03-04 MED ORDER — AMLODIPINE BESYLATE 5 MG PO TABS: 5.0000 mg | ORAL_TABLET | Freq: Every day | ORAL | 0 refills | Status: AC

## 2024-03-04 MED ORDER — ACETAMINOPHEN 325 MG PO TABS: 650.0000 mg | ORAL_TABLET | Freq: Four times a day (QID) | ORAL | Status: DC | PRN

## 2024-03-04 MED ORDER — ENOXAPARIN SODIUM 60 MG/0.6ML IJ SOSY: 55.0000 mg | PREFILLED_SYRINGE | INTRAMUSCULAR | Status: DC

## 2024-03-04 MED ORDER — ACETAMINOPHEN 650 MG RE SUPP: 650.0000 mg | Freq: Four times a day (QID) | RECTAL | Status: DC | PRN

## 2024-03-04 NOTE — Assessment & Plan Note (Addendum)
 Patient with prior similar admissions and multiple recent ER visits for this condition Likely related to cannabinoid hyperemesis syndrome, as she is continuing to use; absolute cessation is needed Overall, her labs and imaging appear benign at this time Encourage marijuana cessation and a good bowel regimen as well as behavioral health support Analgesics as needed - Tylenol  ordered; she understands that opiates will not be prescribed Antiemetics as needed - Zofran , PO compazine  for breakthrough n/v Resume PO reglan  Continue/resume pantoprazole  and famotidine  Previously on duloxetine , amitriptyline ; mood medication may be useful - continue prn PO hydroxyzine  for now, patient is not taking  Also added Zostrix cream

## 2024-03-04 NOTE — Assessment & Plan Note (Addendum)
 New diagnosis during 12/2023 hospitalization Started on amlodipine  at that time but she is not taking it Will prescribe amlodipine  once again

## 2024-03-04 NOTE — Hospital Course (Signed)
 28yo with h/o anxiety, cannabinoid hyperemesis syndrome, and tobacco use who presented on 11/22 with intractable n/v. Given IVF, diet, analgesics/antiemetics.

## 2024-03-04 NOTE — Discharge Summary (Signed)
 Physician Discharge Summary   Patient: Christine Trujillo MRN: [id number] DOB: [birth year]  Admit date:     03/03/2024  Discharge date: 03/04/24  Discharge Physician: Delon Herald   PCP: Frann Mabel Mt, DO   Recommendations at discharge:   Do NOT use marijuana! Stop smoking - nicotine  patch provided Medications for nausea are provided Take amlodipine  (Norvasc ) for high blood pressure Follow up with Dr. Frann in 1-2 weeks  Discharge Diagnoses: Principal Problem:   Cyclical vomiting syndrome Active Problems:   Obesity, Class III, BMI 40-49.9 (morbid obesity) (HCC)   Tobacco abuse   Cannabis hyperemesis syndrome   Essential hypertension   Hospital Course: 28yo with h/o anxiety, cannabinoid hyperemesis syndrome, and tobacco use who presented on 11/22 with intractable n/v. Given IVF, diet, analgesics/antiemetics.   Assessment and Plan:  Assessment & Plan Cyclical vomiting syndrome Cannabis hyperemesis syndrome Patient with prior similar admissions and multiple recent ER visits for this condition Likely related to cannabinoid hyperemesis syndrome, as she is continuing to use; absolute cessation is needed Overall, her labs and imaging appear benign at this time Encourage marijuana cessation and a good bowel regimen as well as behavioral health support Analgesics as needed - Tylenol  ordered; she understands that opiates will not be prescribed Antiemetics as needed - Zofran , PO compazine  for breakthrough n/v Resume PO reglan  Continue/resume pantoprazole  and famotidine  Previously on duloxetine , amitriptyline ; mood medication may be useful - continue prn PO hydroxyzine  for now, patient is not taking  Also added Zostrix cream Tobacco abuse Smoking cessation counseling Nicotine  patch ordered Essential hypertension New diagnosis during 12/2023 hospitalization Started on amlodipine  at that time but she is not taking it Will prescribe amlodipine  once again Obesity,  Class III, BMI 40-49.9 (morbid obesity) (HCC) Body mass index is 42.53 kg/m.SABRA  Weight loss should be encouraged Outpatient PCP/bariatric medicine f/u encouraged Significantly low or high BMI is associated with higher medical risk including morbidity and mortality         Consultants: None   Procedures: None   Antibiotics: None   Pain control - [place]  Controlled Substance Reporting System database was reviewed. and patient was instructed, not to drive, operate heavy machinery, perform activities at heights, swimming or participation in water activities or provide baby-sitting services while on Pain, Sleep and Anxiety Medications; until their outpatient Physician has advised to do so again. Also recommended to not to take more than prescribed Pain, Sleep and Anxiety Medications.   Disposition: Home Diet recommendation:  Regular diet DISCHARGE MEDICATION: Allergies as of 03/04/2024   No Known Allergies      Medication List     STOP taking these medications    amitriptyline  10 MG tablet Commonly known as: ELAVIL    capsaicin  0.025 % cream Commonly known as: ZOSTRIX Replaced by: capsicum 0.075 % topical cream   capsaicin -methyl sal-menthol  0.025-1-12 % topical cream Generic drug: Capsaicin -Menthol -Methyl Sal   ibuprofen  800 MG tablet Commonly known as: ADVIL    multivitamin with minerals tablet   ondansetron  4 MG disintegrating tablet Commonly known as: ZOFRAN -ODT   oxyCODONE -acetaminophen  5-325 MG tablet Commonly known as: Percocet   promethazine  25 MG suppository Commonly known as: PHENERGAN    Vitamin D  (Ergocalciferol ) 1.25 MG (50000 UNIT) Caps capsule Commonly known as: DRISDOL        TAKE these medications    acetaminophen  325 MG tablet Commonly known as: TYLENOL  Take 2 tablets (650 mg total) by mouth every 6 (six) hours as needed for mild pain (pain score 1-3) or fever (or Fever >/= 101).  amLODipine  5 MG tablet Commonly known as:  NORVASC  Take 1 tablet (5 mg total) by mouth daily.   capsicum 0.075 % topical cream Commonly known as: ZOSTRIX Apply topically 2 (two) times daily. Replaces: capsaicin  0.025 % cream   famotidine  20 MG tablet Commonly known as: PEPCID  Take 1 tablet (20 mg total) by mouth 2 (two) times daily.   hydrOXYzine  25 MG tablet Commonly known as: ATARAX  Take 1 tablet (25 mg total) by mouth 3 (three) times daily as needed for anxiety (refractory n/v).   metoCLOPramide  10 MG tablet Commonly known as: Reglan  Take 0.5 tablets (5 mg total) by mouth 3 (three) times daily.   ondansetron  4 MG tablet Commonly known as: ZOFRAN  Take 1 tablet (4 mg total) by mouth every 6 (six) hours.   pantoprazole  40 MG tablet Commonly known as: PROTONIX  Take 1 tablet (40 mg total) by mouth daily.   potassium chloride  10 MEQ tablet Commonly known as: KLOR-CON  M Take 2 tablets (20 mEq total) by mouth daily.   prochlorperazine  10 MG tablet Commonly known as: COMPAZINE  Take 1 tablet (10 mg total) by mouth 2 (two) times daily as needed for nausea or vomiting.        Follow-up Information     Frann Mabel Mt, DO Follow up in 1 week(s).   Specialty: Family Medicine Contact information: [address] [place] [address] [phone]                Discharge Exam:   Subjective: Last used marijuana 3 days ago.  Has taken 4 showers, per nursing staff. Tolerating some liquids, regular diet try ordered.  Agrees with plan for dc.   Objective: Vitals:   03/04/24 0432 03/04/24 1017  BP: (!) 175/103 (!) 148/90  Pulse: (!) 55 71  Resp: 20 20  Temp: 97.9 F (36.6 C) 98 F (36.7 C)  SpO2:      Intake/Output Summary (Last 24 hours) at 03/04/2024 1116 Last data filed at 03/04/2024 9187 Gross per 24 hour  Intake 580 ml  Output --  Net 580 ml   Filed Weights   03/03/24 2028 03/03/24 2327 03/04/24 0050  Weight: 53.5 kg 117.9 kg 108.9 kg    Exam:  General:  Appears  calm and comfortable and is in NAD Eyes:  normal lids, iris ENT:  grossly normal hearing, lips & tongue, mmm Cardiovascular:  RRR, no m/r/g. No LE edema.  Respiratory:   CTA bilaterally with no wheezes/rales/rhonchi.  Normal respiratory effort. Abdomen:  soft, NT, ND Skin:  no rash or induration seen on limited exam Musculoskeletal:  grossly normal tone BUE/BLE, good ROM, no bony abnormality Psychiatric:  grossly normal mood and affect, speech fluent and appropriate, AOx3 Neurologic:  CN 2-12 grossly intact, moves all extremities in coordinated fashion  Data Reviewed: I have reviewed the patient's lab results since admission.  Pertinent labs for today include:   K+ 3.2 CO2 20, stable Glucose 120 WBC 14.8, down from 17.6    Condition at discharge: stable  The results of significant diagnostics from this hospitalization (including imaging, microbiology, ancillary and laboratory) are listed below for reference.   Imaging Studies: No results found.  Microbiology: Results for orders placed or performed during the hospital encounter of 01/03/24  Resp panel by RT-PCR (RSV, Flu A&B, Covid) Anterior Nasal Swab     Status: None   Collection Time: 01/03/24 11:22 AM   Specimen: Anterior Nasal Swab  Result Value Ref Range Status  SARS Coronavirus 2 by RT PCR NEGATIVE NEGATIVE Final    Comment: (NOTE) SARS-CoV-2 target nucleic acids are NOT DETECTED.  The SARS-CoV-2 RNA is generally detectable in upper respiratory specimens during the acute phase of infection. The lowest concentration of SARS-CoV-2 viral copies this assay can detect is 138 copies/mL. A negative result does not preclude SARS-Cov-2 infection and should not be used as the sole basis for treatment or other patient management decisions. A negative result may occur with  improper specimen collection/handling, submission of specimen other than nasopharyngeal swab, presence of viral mutation(s) within the areas targeted by  this assay, and inadequate number of viral copies(<138 copies/mL). A negative result must be combined with clinical observations, patient history, and epidemiological information. The expected result is Negative.  Fact Sheet for Patients:  bloggercourse.com  Fact Sheet for Healthcare Providers:  seriousbroker.it  This test is no t yet approved or cleared by the [place]  FDA and  has been authorized for detection and/or diagnosis of SARS-CoV-2 by FDA under an Emergency Use Authorization (EUA). This EUA will remain  in effect (meaning this test can be used) for the duration of the COVID-19 declaration under Section 564(b)(1) of the Act, 21 U.S.C.section 360bbb-3(b)(1), unless the authorization is terminated  or revoked sooner.       Influenza A by PCR NEGATIVE NEGATIVE Final   Influenza B by PCR NEGATIVE NEGATIVE Final    Comment: (NOTE) The Xpert Xpress SARS-CoV-2/FLU/RSV plus assay is intended as an aid in the diagnosis of influenza from Nasopharyngeal swab specimens and should not be used as a sole basis for treatment. Nasal washings and aspirates are unacceptable for Xpert Xpress SARS-CoV-2/FLU/RSV testing.  Fact Sheet for Patients: bloggercourse.com  Fact Sheet for Healthcare Providers: seriousbroker.it  This test is not yet approved or cleared by the [place]  FDA and has been authorized for detection and/or diagnosis of SARS-CoV-2 by FDA under an Emergency Use Authorization (EUA). This EUA will remain in effect (meaning this test can be used) for the duration of the COVID-19 declaration under Section 564(b)(1) of the Act, 21 U.S.C. section 360bbb-3(b)(1), unless the authorization is terminated or revoked.     Resp Syncytial Virus by PCR NEGATIVE NEGATIVE Final    Comment: (NOTE) Fact Sheet for Patients: bloggercourse.com  Fact Sheet  for Healthcare Providers: seriousbroker.it  This test is not yet approved or cleared by the [place]  FDA and has been authorized for detection and/or diagnosis of SARS-CoV-2 by FDA under an Emergency Use Authorization (EUA). This EUA will remain in effect (meaning this test can be used) for the duration of the COVID-19 declaration under Section 564(b)(1) of the Act, 21 U.S.C. section 360bbb-3(b)(1), unless the authorization is terminated or revoked.  Performed at [facility], [address] [place], [place] [address]     Labs: CBC: Recent Labs  Lab 03/02/24 [phone] 03/02/24 1531 03/03/24 0032 03/03/24 0959 03/03/24 2106  WBC 13.8* 13.8*  --  17.6* 14.8*  NEUTROABS 11.5* 12.1*  --  14.0* 11.1*  HGB 12.8 12.7 13.9 12.4 12.3  HCT 38.5 38.0 41.0 36.1 35.7*  MCV 85.0 84.4  --  83.0 82.8  PLT 331 297  --  316 300   Basic Metabolic Panel: Recent Labs  Lab 03/02/24 0726 03/02/24 1531 03/03/24 0032 03/03/24 0959 03/03/24 2106  NA 138 136 134* 133* 134*  K 3.9 3.1* 3.4* 2.9* 3.2*  CL 103 100 102 96* 98  CO2 18* 20*  --  21* 20*  GLUCOSE 129* 120* 116* 103* 120*  BUN 9 6 6 9 8   CREATININE 0.77 0.64 0.60 0.77 0.69  CALCIUM 9.4 9.2  --  9.4 9.1  MG  --  1.7  --  1.9 1.9   Liver Function Tests: Recent Labs  Lab 03/02/24 0726 03/02/24 1531 03/03/24 0959 03/03/24 2106  AST 24 19 23 27   ALT 13 12 16 19   ALKPHOS 76 77 66 68  BILITOT 0.8 0.9 1.0 1.1  PROT 7.6 7.6 7.3 7.0  ALBUMIN 4.8 4.9 4.5 4.6   CBG: Recent Labs  Lab 03/02/24 1342 03/02/24 1539  GLUCAP 142* 147*    Discharge time spent: greater than 30 minutes.  Signed: Delon Herald, MD [facility] 03/04/2024

## 2024-03-04 NOTE — Assessment & Plan Note (Deleted)
 Body mass index is 42.53 kg/m.Christine Trujillo  Weight loss should be encouraged Outpatient PCP/bariatric medicine f/u encouraged Significantly low or high BMI is associated with higher medical risk including morbidity and mortality

## 2024-03-04 NOTE — Plan of Care (Signed)

## 2024-03-04 NOTE — Assessment & Plan Note (Deleted)
 Smoking cessation counseling Nicotine  patch ordered

## 2024-03-04 NOTE — Assessment & Plan Note (Addendum)
 Body mass index is 42.53 kg/m.Christine Trujillo  Weight loss should be encouraged Outpatient PCP/bariatric medicine f/u encouraged Significantly low or high BMI is associated with higher medical risk including morbidity and mortality

## 2024-03-04 NOTE — Assessment & Plan Note (Deleted)
 Patient with prior similar admission for this condition Likely related to cannabinoid hyperemesis syndrome, as she is continuing to use daily; absolute cessation is needed Overall, her labs and imaging appear benign at this time Encourage marijuana cessation and a good bowel regimen as well as behavioral health support Analgesics as needed - Tylenol  ordered; she understands that opiates will not be prescribed Antiemetics as needed - Zofran , Phenergan  suppository for breakthrough n/v Added Reglan  5 mg IV q6h, will continue PO reglan  Continue pantoprazole  and famotidine  Previously on duloxetine ; mood medication may be useful - continue prn PO hydroxyzine  for now Also added Zostrix cream

## 2024-03-04 NOTE — H&P (Signed)
 History and Physical    Christine Trujillo FMW:[id number] DOB: 11-25-1995 DOA: 03/03/2024  PCP: Christine Mabel Mt, DO   Chief Complaint: Nausea/vomiting  HPI: Christine Trujillo is a 28 y.o. female with medical history significant of cannabis hyperemesis syndrome, GERD, hypertension who presented to the department due to 2 days of persistent vomiting.  Patient's had previous issues with cannabis hyperemesis and takes Compazine  at home.  She states that she is unable to keep oral intake so she presented to outside emergency department.  On arrival she was afebrile and hemodynamically stable.  Labs were obtained on presentation which showed WBC 17.6, hemoglobin 12.4, sodium 133, potassium 2.9, magnesium  1.9.  She underwent CT abdomen pelvis which showed no acute findings.   Review of Systems: Review of Systems  Constitutional:  Negative for chills and fever.  HENT: Negative.    Eyes: Negative.   Respiratory: Negative.    Cardiovascular: Negative.   Gastrointestinal:  Positive for abdominal pain, nausea and vomiting.  Genitourinary: Negative.   Musculoskeletal:  Positive for myalgias.  Skin: Negative.   Neurological: Negative.   Endo/Heme/Allergies: Negative.   Psychiatric/Behavioral: Negative.    All other systems reviewed and are negative.    As per HPI otherwise 10 point review of systems negative.   No Known Allergies  Past Medical History:  Diagnosis Date   Anemia    Anxiety    Cannabinoid hyperemesis syndrome    Depression    Diabetes mellitus without complication (HCC)    GERD (gastroesophageal reflux disease)    History of anemia    during pregnancy   History of gestational diabetes    Ovarian cyst    left   Pelvic pain    Upper respiratory symptom    symptoms started 02-16-2021 cough/ runny nose;  pcp visit 03-10-2021 with sore throat/ congestion/ sob/ body aches/ cough but no fever, negative covid/ flu/ strep resultsin epic  (03-13-2021  pt stated today  only has nonproductive cough)    Past Surgical History:  Procedure Laterality Date   BIOPSY  10/22/2020   Procedure: BIOPSY;  Surgeon: Christine Sandor GAILS, DO;  Location: [facility];  Service: Gastroenterology;;   CHOLECYSTECTOMY N/A 12/02/2016   Procedure: LAPAROSCOPIC CHOLECYSTECTOMY WITH INTRAOPERATIVE CHOLANGIOGRAM;  Surgeon: Christine Lenis, MD;  Location: [facility];  Service: General;  Laterality: N/A;   COLONOSCOPY WITH PROPOFOL  N/A 10/22/2020   Procedure: COLONOSCOPY WITH PROPOFOL ;  Surgeon: Christine Sandor GAILS, DO;  Location: [facility];  Service: Gastroenterology;  Laterality: N/A;   ESOPHAGOGASTRODUODENOSCOPY (EGD) WITH PROPOFOL  N/A 10/22/2020   Procedure: ESOPHAGOGASTRODUODENOSCOPY (EGD) WITH PROPOFOL ;  Surgeon: Christine Sandor GAILS, DO;  Location: [facility];  Service: Gastroenterology;  Laterality: N/A;   ESOPHAGOGASTRODUODENOSCOPY (EGD) WITH PROPOFOL  N/A 10/22/2020   Procedure: ESOPHAGOGASTRODUODENOSCOPY (EGD) WITH PROPOFOL ;  Surgeon: Christine Sandor GAILS, DO;  Location: [facility];  Service: Gastroenterology;  Laterality: N/A;   LAPAROSCOPIC OVARIAN CYSTECTOMY Left 03/17/2021   Procedure: LAPAROSCOPIC Resection of Adnexal Mass;  Surgeon: Christine Coy, MD;  Location[facility];  Service: Gynecology;  Laterality: Left;   LAPAROSCOPIC SALPINGO OOPHERECTOMY Bilateral 03/17/2021   Procedure: LAPAROSCOPIC BIALTERAL SALPINGECTOMY;  Surgeon: Christine Coy, MD;  Location: [facility];  Service: Gynecology;  Laterality: Bilateral;   TUBAL LIGATION     pt unsure of affected side (procedure on 03/17/21)   WISDOM TOOTH EXTRACTION       reports that she has been smoking cigarettes. She has a 2.5 pack-year smoking history. She has never used smokeless tobacco. She reports current  drug use. Drug: Marijuana. She reports that she does not drink alcohol.  Family History  Problem Relation Age of Onset   Diabetes Mother    Heart disease Mother         CHF   Stomach cancer Brother    [place] cancer Neg Hx    Pancreatic cancer Neg Hx    Esophageal cancer Neg Hx    Liver disease Neg Hx     Prior to Admission medications   Medication Sig Start Date End Date Taking? Authorizing Provider  amitriptyline  (ELAVIL ) 10 MG tablet Take 1 tablet (10 mg total) by mouth at bedtime. 01/23/24   Christine Mabel Mt, DO  amLODipine  (NORVASC ) 5 MG tablet Take 1 tablet (5 mg total) by mouth daily. 01/07/24   Christine Nest, MD  capsaicin  (ZOSTRIX) 0.025 % cream Apply topically 2 (two) times daily. 01/06/24   Christine Nest, MD  Capsaicin -Menthol -Methyl Sal (CAPSAICIN -METHYL SAL-MENTHOL ) 0.025-1-12 % CREA Apply 1 Application topically 3 (three) times daily as needed. 03/02/24   Christine Jayson LABOR, DO  famotidine  (PEPCID ) 20 MG tablet Take 1 tablet (20 mg total) by mouth 2 (two) times daily. 07/11/23   Christine Mabel Mt, DO  hydrOXYzine  (ATARAX ) 25 MG tablet Take 1 tablet (25 mg total) by mouth 3 (three) times daily as needed for anxiety (refractory n/v). 01/23/24   Christine Mabel Mt, DO  ibuprofen  (ADVIL ) 800 MG tablet Take 1 tablet (800 mg total) by mouth 3 (three) times daily. 02/24/24   Christine Chew, PA-C  metoCLOPramide  (REGLAN ) 10 MG tablet Take 0.5 tablets (5 mg total) by mouth 3 (three) times daily. 01/06/24 01/05/25  Christine Nest, MD  Multiple Vitamins-Minerals (MULTIVITAMIN WITH MINERALS) tablet Take 1 tablet by mouth daily.    [provider]  ondansetron  (ZOFRAN ) 4 MG tablet Take 1 tablet (4 mg total) by mouth every 6 (six) hours. 02/17/24   Christine Colon, PA-C  ondansetron  (ZOFRAN -ODT) 4 MG disintegrating tablet Take 1 tablet (4 mg total) by mouth every 8 (eight) hours as needed for nausea or vomiting. Patient taking differently: Take 4 mg by mouth every 8 (eight) hours as needed for nausea or vomiting (dissolve orally). 10/08/23   Christine Carlin NOVAK, MD  oxyCODONE -acetaminophen  (PERCOCET) 5-325 MG tablet Take 1-2 tablets by  mouth every 6 (six) hours as needed. 02/20/24   Christine Berg, MD  pantoprazole  (PROTONIX ) 40 MG tablet Take 1 tablet (40 mg total) by mouth daily. 10/12/23   Christine Trujillo POUR, MD  potassium chloride  (KLOR-CON  M) 10 MEQ tablet Take 2 tablets (20 mEq total) by mouth daily. 03/03/24   Horton, Roxie HERO, DO  prochlorperazine  (COMPAZINE ) 10 MG tablet Take 1 tablet (10 mg total) by mouth 2 (two) times daily as needed for nausea or vomiting. 02/19/24   Neysa Caron PARAS, DO  promethazine  (PHENERGAN ) 25 MG suppository Place 1 suppository (25 mg total) rectally every 6 (six) hours as needed for refractory nausea / vomiting. 03/02/24   Christine Jayson LABOR, DO  Vitamin D , Ergocalciferol , (DRISDOL ) 1.25 MG (50000 UNIT) CAPS capsule Take 1 capsule (50,000 Units total) by mouth every 7 (seven) days. 01/24/24   Christine Mabel Mt, DO    Physical Exam: Vitals:   03/03/24 2330 03/03/24 2345 03/04/24 0048 03/04/24 0050  BP: (!) 165/89 (!) 154/92 (!) 185/107   Pulse: 61 78 61   Resp: (!) 22 20 20    Temp:   98.5 F (36.9 C)   TempSrc:   Oral   SpO2: 100% 100% 100%  Weight:    108.9 kg  Height:    5' 3 (1.6 m)   Physical Exam Vitals reviewed.  Constitutional:      Appearance: She is obese.  HENT:     Head: Normocephalic.     Nose: Nose normal.     Mouth/Throat:     Mouth: Mucous membranes are moist.     Pharynx: Oropharynx is clear.  Eyes:     Conjunctiva/sclera: Conjunctivae normal.     Pupils: Pupils are equal, round, and reactive to light.  Cardiovascular:     Rate and Rhythm: Normal rate and regular rhythm.     Pulses: Normal pulses.     Heart sounds: Normal heart sounds.  Pulmonary:     Effort: Pulmonary effort is normal.     Breath sounds: Normal breath sounds.  Abdominal:     General: Abdomen is flat. Bowel sounds are normal.  Musculoskeletal:        General: Normal range of motion.     Cervical back: Normal range of motion.  Skin:    General: Skin is warm.     Capillary Refill:  Capillary refill takes less than 2 seconds.  Neurological:     Mental Status: She is alert. Mental status is at baseline.  Psychiatric:        Mood and Affect: Mood normal.       Labs on Admission: I have personally reviewed the patients's labs and imaging studies.  Assessment/Plan Principal Problem:   Cyclical vomiting syndrome Active Problems:   Cannabis hyperemesis syndrome   # Cannabis hyperemesis syndrome - Patient only takes Compazine  at home - Endorsing frequent shower usage - Previously prescribed Elavil  however patient not taking  Plan: Start Elavil  25 mg nightly Encouraged marijuana cessation next  # GERD-continue Protonix   # Hypokalemia-replete potassium   Admission status: Inpatient Med-Surg  Certification: The appropriate patient status for this patient is INPATIENT. Inpatient status is judged to be reasonable and necessary in order to provide the required intensity of service to ensure the patient's safety. The patient's presenting symptoms, physical exam findings, and initial radiographic and laboratory data in the context of their chronic comorbidities is felt to place them at high risk for further clinical deterioration. Furthermore, it is not anticipated that the patient will be medically stable for discharge from the hospital within 2 midnights of admission.   * I certify that at the point of admission it is my clinical judgment that the patient will require inpatient hospital care spanning beyond 2 midnights from the point of admission due to high intensity of service, high risk for further deterioration and high frequency of surveillance required.DEWAINE Lamar Dess MD [facility] If 7PM-7AM, please contact night-coverage [email or web address]  03/04/2024, 4:12 AM

## 2024-03-04 NOTE — Assessment & Plan Note (Signed)
 Smoking cessation counseling Nicotine  patch ordered

## 2024-03-04 NOTE — Assessment & Plan Note (Deleted)
 New diagnosis during 12/2023 hospitalization Started on amlodipine 

## 2024-03-04 NOTE — ED Notes (Signed)
 Pt refuses to keep the cardiac monitor on, she keeps pulling off her leads and stating I'm not keeping it on. Explained to patient why it was ordered and the importance but patient refused, states she can't sleep with all the cords and pulls it off anyway. Pt has been pulling her cords off, taking the blood pressure cuff off, and pulse ox all shift. I keep re-educating patient but she still takes it off.

## 2024-03-05 ENCOUNTER — Telehealth: Payer: Self-pay

## 2024-03-05 ENCOUNTER — Other Ambulatory Visit (HOSPITAL_COMMUNITY): Payer: Self-pay

## 2024-03-05 NOTE — Transitions of Care (Post Inpatient/ED Visit) (Signed)
   03/05/2024  Name: [name] MRN: [id number] DOB: 1995-05-17  Today's TOC FU Call Status: Today's TOC FU Call Status:: Unsuccessful Call (1st Attempt) Unsuccessful Call (1st Attempt) Date: 03/05/24  Attempted to reach the patient regarding the most recent Inpatient/ED visit.  Follow Up Plan: No further outreach attempts will be made at this time. We have been unable to contact the patient.  Signature  Charmaine Bloodgood, LPN [facility] Advisor [place] [facility] You Are. We Are. [facility] Direct Dial [phone]

## 2024-03-05 NOTE — Telephone Encounter (Signed)
 Pt seen at ED

## 2024-03-05 NOTE — Telephone Encounter (Signed)
 Initial Comment Caller states that they would like to speak with a nurse because his wife is vomiting uncontrollably. The pt. was in the ER and being released her and the medication that the pt. was taken off the medication. The caller is worried about her going home to home because she is still vomiting uncontrollably. The pt. is also having diarrhea. GOTO Facility Not Listed [facility] ED Translation No Nurse Assessment Nurse: Arthor, RN, Schuyler Date/Time (Eastern Time): 03/02/2024 9:38:40 PM Confirm and document reason for call. If symptomatic, describe symptoms. ---Caller states Fiance was just released from the ED for uncontrollable vomiting, and abdominal pain, and severe diarrhea; symptoms started yesterday. Unable to keep any food or drinks down. Was given nausea medication but she is vomiting that too. Currently rates pain 9/10. Does the patient have any new or worsening symptoms? ---Yes Will a triage be completed? ---Yes Related visit to physician within the last 2 weeks? ---Yes Does the PT have any chronic conditions? (i.e. diabetes, asthma, this includes High risk factors for pregnancy, etc.) ---Yes List chronic conditions. ---acid reflux gallbladder surgery Is the patient pregnant or possibly pregnant? (Ask all females between the ages of 16-55) ---No Is this a behavioral health or substance abuse call? ---No PLEASE NOTE: All timestamps contained within this report are represented as Eastern Standard Time. CONFIDENTIALTY NOTICE: This fax transmission is intended only for the addressee. It contains information that is legally privileged, confidential or otherwise protected from use or disclosure. If you are not the intended recipient, you are strictly prohibited from reviewing, disclosing, copying using or disseminating any of this information or taking any action in reliance on or regarding this information. If you have received this fax in error, please notify us   immediately by telephone so that we can arrange for its return to us . Phone: [phone], Toll-Free: [phone], Fax: [phone] [email or web address] 14-Apr-1995 Page: 1 of2 CallId: [id number] Guidelines Guideline Title Affirmed Question Affirmed Notes Nurse Date/Time Titus Time) Vomiting [1] Vomiting AND [2] contains red blood or black (coffee ground) material (Exception: Few red streaks in vomit that only happened once.) Belkofer, RN, [facility] 03/02/2024 9:45:29 PM Disp. Time Titus Time) Disposition Final User 03/02/2024 9:49:45 PM Go to ED Now Yes Arthor, RN, Schuyler Final Disposition 03/02/2024 9:49:45 PM Go to ED Now Yes Belkofer, RN, Schuyler Caller Disagree/Comply Comply Caller Understands Yes PreDisposition Go to ED Care Advice Given Per Guideline GO TO ED NOW: * You need to be seen in the Emergency Department. ANOTHER ADULT SHOULD DRIVE: * It is better and safer if another adult drives instead of you. Too weak to stand or feel like she might faint - call 911. CARE ADVICE per Vomiting (Adult) guideline. Referrals GO TO FACILITY OTHER - SPECIFY

## 2024-03-06 ENCOUNTER — Other Ambulatory Visit: Payer: Self-pay

## 2024-03-06 ENCOUNTER — Ambulatory Visit (INDEPENDENT_AMBULATORY_CARE_PROVIDER_SITE_OTHER)

## 2024-03-06 ENCOUNTER — Ambulatory Visit: Payer: Self-pay | Admitting: Family Medicine

## 2024-03-06 ENCOUNTER — Telehealth: Admitting: Family Medicine

## 2024-03-06 VITALS — Resp 16 | Ht 63.0 in

## 2024-03-06 DIAGNOSIS — I10 Essential (primary) hypertension: Secondary | ICD-10-CM

## 2024-03-06 LAB — COMPREHENSIVE METABOLIC PANEL WITH GFR
ALT: 21 U/L (ref 0–35)
AST: 12 U/L (ref 0–37)
Albumin: 4.5 g/dL (ref 3.5–5.2)
Alkaline Phosphatase: 64 U/L (ref 39–117)
BUN: 12 mg/dL (ref 6–23)
CO2: 28 meq/L (ref 19–32)
Calcium: 9.3 mg/dL (ref 8.4–10.5)
Chloride: 102 meq/L (ref 96–112)
Creatinine, Ser: 0.91 mg/dL (ref 0.40–1.20)
GFR: 85.32 mL/min (ref >60.00)
Glucose, Bld: 72 mg/dL (ref 70–99)
Potassium: 3.2 meq/L — ABNORMAL LOW (ref 3.5–5.1)
Sodium: 139 meq/L (ref 135–145)
Total Bilirubin: 0.5 mg/dL (ref 0.2–1.2)
Total Protein: 6.7 g/dL (ref 6.0–8.3)

## 2024-03-06 LAB — CBC WITH DIFFERENTIAL/PLATELET
Basophils Absolute: 0 K/uL (ref 0.0–0.1)
Basophils Relative: 0.2 % (ref 0.0–3.0)
Eosinophils Absolute: 0.2 K/uL (ref 0.0–0.7)
Eosinophils Relative: 2.6 % (ref 0.0–5.0)
HCT: 40 % (ref 36.0–46.0)
Hemoglobin: 13.3 g/dL (ref 12.0–15.0)
Lymphocytes Relative: 28.8 % (ref 12.0–46.0)
Lymphs Abs: 2.7 K/uL (ref 0.7–4.0)
MCHC: 33.3 g/dL (ref 30.0–36.0)
MCV: 86.4 fl (ref 78.0–100.0)
Monocytes Absolute: 0.7 K/uL (ref 0.1–1.0)
Monocytes Relative: 7.8 % (ref 3.0–12.0)
Neutro Abs: 5.7 K/uL (ref 1.4–7.7)
Neutrophils Relative %: 60.6 % (ref 43.0–77.0)
Platelets: 302 K/uL (ref 150.0–400.0)
RBC: 4.63 Mil/uL (ref 3.87–5.11)
RDW: 14.1 % (ref 11.5–15.5)
WBC: 9.3 K/uL (ref 4.0–10.5)

## 2024-03-06 LAB — MAGNESIUM: Magnesium: 2.1 mg/dL (ref 1.5–2.5)

## 2024-03-06 MED ORDER — POTASSIUM CHLORIDE CRYS ER 10 MEQ PO TBCR: 20.0000 meq | EXTENDED_RELEASE_TABLET | Freq: Every day | ORAL | 0 refills | Status: AC

## 2024-03-07 NOTE — Progress Notes (Signed)
 I was late and the patient was unable to wait for me. I did see her in the waiting room and apologized profusely. Will r/s at her convenience.

## 2024-03-10 ENCOUNTER — Encounter (HOSPITAL_BASED_OUTPATIENT_CLINIC_OR_DEPARTMENT_OTHER): Payer: Self-pay | Admitting: Emergency Medicine

## 2024-03-10 ENCOUNTER — Other Ambulatory Visit: Payer: Self-pay

## 2024-03-10 ENCOUNTER — Emergency Department (HOSPITAL_BASED_OUTPATIENT_CLINIC_OR_DEPARTMENT_OTHER)
Admission: EM | Admit: 2024-03-10 | Discharge: 2024-03-11 | Disposition: A | Discharge status: Home / Self Care | Attending: Emergency Medicine | Admitting: Emergency Medicine

## 2024-03-10 DIAGNOSIS — M25572 Pain in left ankle and joints of left foot: Secondary | ICD-10-CM | POA: Diagnosis not present

## 2024-03-10 DIAGNOSIS — X58XXXA Exposure to other specified factors, initial encounter: Secondary | ICD-10-CM | POA: Insufficient documentation

## 2024-03-10 NOTE — ED Triage Notes (Addendum)
 Pt reports being on her feet a lot for Thanksgiving and has had ankle pain since. Hx of broken ankle with hardware. Pt reports now new injury, but states she thinks she aggravated an old injury. Ambulatory on arrival without assistance. Pt reports taking a muscle relaxer ~45 min ago for her pain.

## 2024-03-11 ENCOUNTER — Emergency Department (HOSPITAL_BASED_OUTPATIENT_CLINIC_OR_DEPARTMENT_OTHER)

## 2024-03-11 NOTE — ED Provider Notes (Signed)
 [place] EMERGENCY DEPARTMENT AT [facility] [facility] Provider Note   CSN: [id number] Arrival date & time: 03/10/24  2331     Patient presents with: Ankle Pain   Christine Trujillo is a 28 y.o. female.   Patient is a 28 year old female presenting with left ankle pain.  She reports being on her feet a lot during the holiday weekend.  She denies any specific injury or trauma, but reports pain and swelling to the left ankle.  She reports a prior injury that required surgery and apparently has some hardware in her ankle.       Prior to Admission medications   Medication Sig Start Date End Date Taking? Authorizing Provider  acetaminophen  (TYLENOL ) 325 MG tablet Take 2 tablets (650 mg total) by mouth every 6 (six) hours as needed for mild pain (pain score 1-3) or fever (or Fever >/= 101). 03/04/24   Barbarann Nest, MD  amLODipine  (NORVASC ) 5 MG tablet Take 1 tablet (5 mg total) by mouth daily. 03/04/24   Barbarann Nest, MD  capsicum (ZOSTRIX) 0.075 % topical cream Apply topically 2 (two) times daily. 03/04/24   Barbarann Nest, MD  famotidine  (PEPCID ) 20 MG tablet Take 1 tablet (20 mg total) by mouth 2 (two) times daily. 03/04/24   Barbarann Nest, MD  hydrOXYzine  (ATARAX ) 25 MG tablet Take 1 tablet (25 mg total) by mouth 3 (three) times daily as needed for anxiety (refractory n/v). 01/23/24   Frann Mabel Mt, DO  metoCLOPramide  (REGLAN ) 10 MG tablet Take 0.5 tablets (5 mg total) by mouth 3 (three) times daily. 01/06/24 01/05/25  Barbarann Nest, MD  ondansetron  (ZOFRAN ) 4 MG tablet Take 1 tablet (4 mg total) by mouth every 6 (six) hours. 02/17/24   Nivia Colon, PA-C  pantoprazole  (PROTONIX ) 40 MG tablet Take 1 tablet (40 mg total) by mouth daily. 03/04/24   Barbarann Nest, MD  potassium chloride  (KLOR-CON  M) 10 MEQ tablet Take 2 tablets (20 mEq total) by mouth daily for 5 days. 03/06/24 03/11/24  Frann Mabel Mt, DO  prochlorperazine  (COMPAZINE ) 10 MG tablet Take 1  tablet (10 mg total) by mouth 2 (two) times daily as needed for nausea or vomiting. 02/19/24   Neysa Caron PARAS, DO    Allergies: Patient has no known allergies.    Review of Systems  All other systems reviewed and are negative.   Updated Vital Signs BP (!) 146/104   Pulse 70   Temp 98.5 F (36.9 C)   Resp 20   Ht 5' 3 (1.6 m)   Wt 111.1 kg   LMP 02/09/2024 (Exact Date)   SpO2 100%   BMI 43.40 kg/m   Physical Exam Vitals and nursing note reviewed.  Constitutional:      Appearance: Normal appearance.  HENT:     Head: Normocephalic.  Pulmonary:     Effort: Pulmonary effort is normal.  Musculoskeletal:     Comments: The left ankle has prior surgical scar, but no significant swelling or deformity.  There is tenderness over the medial aspect of the ankle, but no lateral malleoli or tenderness.  DP pulses are palpable and motor and sensation are intact throughout the entire foot.  Skin:    General: Skin is warm and dry.  Neurological:     Mental Status: She is alert and oriented to person, place, and time.     (all labs ordered are listed, but only abnormal results are displayed) Labs Reviewed - No data to display  EKG: None  Radiology: No  results found.   Procedures   Medications Ordered in the ED - No data to display                                  Medical Decision Making Amount and/or Complexity of Data Reviewed Radiology: ordered.   X-rays are negative for fracture or hardware complication.  Will recommend an Ace bandage, rest, and follow-up as needed.     Final diagnoses:  None    ED Discharge Orders     None          Geroldine Berg, MD 03/11/24 [phone]

## 2024-03-11 NOTE — ED Notes (Signed)
 Pt ambulates well to and from bathroom with no apparent distress, bearing weight well.

## 2024-03-11 NOTE — Discharge Instructions (Signed)
 Wear the Ace bandage for comfort and support.  Take ibuprofen  600 mg every 6 hours as needed for pain.  Rest.  Follow-up with your surgeon if symptoms persist.

## 2024-03-13 ENCOUNTER — Telehealth: Admitting: Family Medicine

## 2024-03-13 NOTE — Progress Notes (Signed)
 Pt no showed for appt...Christine KitchenMarland Trujillo

## 2024-04-11 ENCOUNTER — Other Ambulatory Visit (HOSPITAL_BASED_OUTPATIENT_CLINIC_OR_DEPARTMENT_OTHER): Payer: Self-pay

## 2024-04-11 ENCOUNTER — Encounter: Payer: Self-pay | Admitting: Family Medicine

## 2024-04-11 ENCOUNTER — Ambulatory Visit (INDEPENDENT_AMBULATORY_CARE_PROVIDER_SITE_OTHER): Admitting: Family Medicine

## 2024-04-11 VITALS — BP 130/80 | HR 75 | Temp 98.0°F | Resp 16 | Ht 63.0 in | Wt 249.8 lb

## 2024-04-11 DIAGNOSIS — J40 Bronchitis, not specified as acute or chronic: Secondary | ICD-10-CM | POA: Diagnosis not present

## 2024-04-11 MED ORDER — METHYLPREDNISOLONE ACETATE 80 MG/ML IJ SUSP
80.0000 mg | Freq: Once | INTRAMUSCULAR | Status: AC
  Administered 2024-04-11: 80 mg via INTRAMUSCULAR

## 2024-04-11 MED ORDER — HYDROCODONE BIT-HOMATROP MBR 5-1.5 MG/5ML PO SOLN
5.0000 mL | Freq: Three times a day (TID) | ORAL | 0 refills | Status: AC | PRN
  Filled 2024-04-11: qty 120, 8d supply, fill #0

## 2024-04-11 NOTE — Patient Instructions (Signed)
 Do not drink alcohol, do any illicit/street drugs, drive or do anything that requires alertness while on this medicine.   Send me a message in 2 days if not obviously better.  Let us  know if you need anything.

## 2024-04-11 NOTE — Progress Notes (Signed)
 Chief Complaint  Patient presents with   Cough    Cough and Congestion    Reynolds Clay here for URI complaints.  Duration: 8 days  Associated symptoms: sinus congestion, rhinorrhea, sore throat, wheezing, and coughing Denies: sinus pain, itchy watery eyes, ear pain, ear drainage, sore throat, shortness of breath, myalgia, and fevers Treatment to date: Sudafed, Nyquil Sick contacts: Yes- BF  Past Medical History:  Diagnosis Date   Anemia    Anxiety    Cannabinoid hyperemesis syndrome    Depression    Diabetes mellitus without complication (HCC)    GERD (gastroesophageal reflux disease)    History of anemia    during pregnancy   History of gestational diabetes    Ovarian cyst    left   Pelvic pain    Upper respiratory symptom    symptoms started 02-16-2021 cough/ runny nose;  pcp visit 03-10-2021 with sore throat/ congestion/ sob/ body aches/ cough but no fever, negative covid/ flu/ strep resultsin epic  (03-13-2021  pt stated today only has nonproductive cough)    Objective BP 130/80 (BP Location: Left Arm, Patient Position: Sitting)   Pulse 75   Temp 98 F (36.7 C) (Oral)   Resp 16   Ht 5' 3 (1.6 m)   Wt 249 lb 12.8 oz (113.3 kg)   SpO2 98%   BMI 44.25 kg/m  General: Awake, alert, appears stated age HEENT: AT, [place], ears patent b/l and TM's neg, nares patent w/o discharge, pharynx pink and without exudates, MMM, no sinus TTP bilaterally Neck: No masses or asymmetry Heart: RRR Lungs: CTAB, no accessory muscle use Psych: Age appropriate judgment and insight, normal mood and affect  Bronchitis - Plan: methylPREDNISolone  acetate (DEPO-MEDROL ) injection 80 mg  Depo-Medrol  injection today with reports of wheezing.  If no significant proved in the next 2 days, will consider Z-Pak.  Continue to push fluids, practice good hand hygiene, cover mouth when coughing. F/u prn. If starting to experience fevers, shaking, or shortness of breath, seek immediate care. Pt voiced  understanding and agreement to the plan.  Mabel Mt [place], DO 04/11/2024 10:24 AM

## 2024-04-25 ENCOUNTER — Ambulatory Visit: Admitting: Obstetrics and Gynecology

## 2024-04-26 ENCOUNTER — Telehealth: Payer: Self-pay | Admitting: *Deleted

## 2024-04-26 DIAGNOSIS — S025XXA Fracture of tooth (traumatic), initial encounter for closed fracture: Secondary | ICD-10-CM

## 2024-04-26 DIAGNOSIS — I10 Essential (primary) hypertension: Secondary | ICD-10-CM

## 2024-04-26 NOTE — Progress Notes (Signed)
 Complex Care Management Note  Care Guide Note 04/26/2024 Name: Christine Trujillo MRN: [id number] DOB: March 29, 1996  Christine Trujillo is a 29 y.o. year old female who sees Frann, Mabel Mt, DO for primary care. I reached out to [facility] by phone today to offer complex care management services.  Ms. Podoll was given information about Complex Care Management services today including:   The Complex Care Management services include support from the care team which includes your Nurse Care Manager, Clinical Social Worker, or Pharmacist.  The Complex Care Management team is here to help remove barriers to the health concerns and goals most important to you. Complex Care Management services are voluntary, and the patient may decline or stop services at any time by request to their care team member.   Complex Care Management Consent Status: Patient agreed to services and verbal consent obtained.   Follow up plan:  Telephone appointment with complex care management team member scheduled for:  05/01/24  Encounter Outcome:  Patient Scheduled  Harlene Satterfield  [facility]  [facility], [facility] Guide  Direct Dial: [phone]  Fax [phone]

## 2024-05-01 ENCOUNTER — Telehealth: Payer: Self-pay

## 2024-05-01 NOTE — Patient Instructions (Signed)
 Christine Trujillo - I am sorry I was unable to reach you today for our scheduled appointment. I work with Frann, Mabel Mt, DO and am calling to support your healthcare needs. Please contact me at [phone] at your earliest convenience. I look forward to speaking with you soon.   Thank you,  Rosaline Finlay, RN MSN [place]  [facility] RN Care Manager Direct Dial: [phone]  Fax: [phone]

## 2024-05-07 ENCOUNTER — Telehealth: Payer: Self-pay

## 2024-05-07 NOTE — Progress Notes (Signed)
 Complex Care Management Care Guide Note  05/07/2024 Name: Christine Trujillo MRN: [id number] DOB: Nov 24, 1995  Christine Trujillo is a 29 y.o. year old female who is a primary care patient of Frann Mabel Mt, DO and is actively engaged with the care management team. I reached out to [facility] by phone today to assist with re-scheduling  with the RN Case Manager.  Follow up plan: 05/11/24 at 1:00 p.m.   Dreama [facility]  [facility], [facility] VBCI Assistant Direct Dial: [phone]  Fax: [phone]

## 2024-05-11 ENCOUNTER — Other Ambulatory Visit: Payer: Self-pay

## 2024-05-11 NOTE — Patient Instructions (Signed)
 Visit Information  Christine Trujillo was given information about Medicaid Managed Care team care coordination services as a part of their [facility] Community Plan Medicaid benefit.   If you would like to schedule transportation through your [facility], please call the following number at least 2 days in advance of your appointment: [phone]   Rides for urgent appointments can also be made after hours by calling Member Services.  Call the Behavioral Health Crisis Line at [phone], at any time, 24 hours a day, 7 days a week. If you are in danger or need immediate medical attention call 911.  Please see education materials related to Cannabinoid Hyperemesis Syndrome provided by MyChart link.  Patient verbalizes understanding of instructions and care plan provided today and agrees to view in MyChart. Active MyChart status and patient understanding of how to access instructions and care plan via MyChart confirmed with patient.     Telephone follow up appointment with Managed Medicaid care management team member scheduled for: 05/25/24 at 2:30 PM  Contact [facility] to schedule an appointment [phone]  Contact [facility] to schedule sleep study [phone]  Rosaline Finlay, RN MSN [place]  [facility] Population Health RN Care Manager Direct Dial: [phone]  Fax: [phone]   Following is a copy of your plan of care:   Goals Addressed             This Visit's Progress    VBCI RN Care Plan   On track    Problems:  Care Coordination needs related to scheduling outstanding referrals, establishing with dental provider and eye doctor Chronic Disease Management support and education needs related to Tobacco Use and Cannabinoid Hyperemesis Syndrome  Goal: Over the next 30 days the Patient will experience decrease in ED visits as evidenced by electronic medical record review; ED visits in last 6 months = 14 not  experience hospital admission as evidenced by review of electronic medical record. Hospital Admissions in last 6 months = 2 verbalize basic understanding of cannabinoid hyperemesis syndrome disease process and self health management plan as evidenced by verbalizing triggers for nausea and vomiting, and management Schedule appointments with GI and sleep medicine as evidenced by patient report and/or chart review Establish care with dental provider and eye doctor as evidenced by patient report  Interventions:   Evaluation of current treatment plan related to Tobacco Use and cannabinoid hyperemesis syndrome, outstanding referrals self-management and patient's adherence to plan as established by provider. Discussed plans with patient for ongoing care management follow up and provided patient with direct contact information for care management team Provided education to patient re: cannabinoid hyperemesis syndrome cause, management, and treatment including stopping cannabis use. Patient declines cessation from marijuana at this time, stating that it is the only thing that stimulates her appetite Screening for signs and symptoms of depression related to chronic disease state  Assessed social determinant of health barriers Advised patient to contact [facility] at [phone] to schedule appointment Advised patient to contact [facility] at [phone] to schedule sleep study Advised patient to contact dentist (located in [place]) and request to be transferred to their [employer] location Advised patient to contact member services on the back of her insurance card to request the name of 2-3 in-network eye doctors  Patient Self-Care Activities:  Attend all scheduled provider appointments Call provider office for new concerns or questions  Contact GI and sleep medicine to schedule appointments Contact dentist to request transfer to [facility]  location Contact member services to request  the name of in-network eye doctors  Plan:  Telephone follow up appointment with care management team member scheduled for:  05/25/24 at 2:30 PM             Nausea and Vomiting Caused by Cannabis Use (Cannabinoid Hyperemesis Syndrome): What to Know Cannabinoid hyperemesis syndrome (CHS) is a condition that causes repeated nausea, vomiting, and abdominal pain after long-term use of marijuana (cannabis). People with CHS typically use marijuana 3-5 times a day for many years before they have symptoms, although it is possible to develop CHS with far less daily use. Symptoms of CHS may be mild at first but can get worse and more frequent. In some cases, CHS may cause severe daily vomiting, which can lead to weight loss and dehydration. What are the causes? The exact cause of CHS is not known. Long-term use of marijuana may overstimulate certain proteins in the brain and digestive tract that react with chemicals in marijuana (cannabinoid receptors). This overstimulation may cause CHS. What are the signs or symptoms? Symptoms of CHS are often mild during the first few episodes, but they can get worse over time. Symptoms may include: Frequent nausea, especially early in the morning. Vomiting. This can become severe. Abdominal pain. Feeling very tired (lethargic). Headaches. CHS may go away and come back many times (recur). People may not have symptoms or may otherwise be healthy in between [facility] episodes. Taking hot showers can relieve the symptoms of CHS, so feeling the need to take several hot showers throughout the day can be a sign of this condition. How is this diagnosed? CHS may be diagnosed based on: Your symptoms and medical history, including any drug use. A physical exam. You may have tests done to rule out other problems that could cause your symptoms. These tests may include: Blood tests. Urine tests. Imaging tests, such as an X-ray or a CT scan. How is this treated? Treatment for  this condition involves stopping marijuana use. Treatment may include: A drug rehab program, if you have trouble stopping marijuana use. Medicines for nausea. These may be given at the hospital through an IV inserted into one of your veins, or they may be medicines that you take by mouth (orally). Certain creams that contain a substance called capsaicin . These may improve symptoms when applied to the abdomen. Hot showers to help relieve symptoms. In severe cases, you may need treatment at a hospital. You may be given IV fluids to prevent or treat dehydration as well as medicines to treat nausea, vomiting, and pain. Follow these instructions at home: During an episode of CHS  Stay in bed and rest in a dark, quiet room. Take anti-nausea medicine as told by your health care provider. Try taking hot showers to relieve your symptoms. After an episode of CHS Drink small amounts of clear fluids. Slowly add more if you can keep the fluids down without vomiting. Once you are able to eat without vomiting, eat soft foods in small amounts every 3-4 hours. General instructions Do not use any products that contain marijuana.If you need help quitting, ask your health care provider for resources and treatment options. Drink enough fluid to keep your urine pale yellow. Avoid drinking fluids that have a lot of sugar or caffeine , such as coffee and soda. Take and apply over-the-counter and prescription medicines only as told by your health care provider. Ask your health care provider before starting any new medicines or treatments. Keep all follow-up  visits. This includes any recommended programs for substance use disorders. Contact a health care provider if: Your symptoms get worse. You cannot drink fluids without vomiting or severe pain. You have pain and trouble swallowing after an episode. Get help right away if: You cannot stop vomiting. You have blood in your vomit or your vomit looks like coffee  grounds. You have severe abdominal pain. You have stools that are bloody or black, or stools that look like tar. You have symptoms of dehydration, such as: Sunken eyes. Inability to make tears. Cracked lips or dry mouth. Decreased urine production. Weakness. Sleepiness. Dizziness, light-headedness, or fainting. These symptoms may be an emergency. Get help right away. Call 911. Do not wait to see if the symptoms will go away. Do not drive yourself to the hospital. Summary Cannabinoid hyperemesis syndrome (CHS) is a condition that causes repeated nausea, vomiting, and abdominal pain after long-term use of marijuana. Treatment for this condition involves stopping marijuana use. Hot showers and capsaicin  creams may also help relieve symptoms. Your health care provider may prescribe medicines to help with nausea. Ask your health care provider before starting any medicines or other treatments. This information is not intended to replace advice given to you by your health care provider. Make sure you discuss any questions you have with your health care provider. Document Revised: 02/02/2024 Document Reviewed: 07/27/2021 Elsevier Patient Education  2025 [employer].

## 2024-05-11 NOTE — Patient Outreach (Signed)
 Complex Care Management   Visit Note  05/11/2024  Name:  Christine Trujillo MRN: [id number] DOB: [birth year]  Situation: Referral received for Complex Care Management related to cannabinoid hyperemesis syndrome, high utilization I obtained verbal consent from Patient.  Visit completed with Patient  on the phone  Background:   Past Medical History:  Diagnosis Date   Anemia    Anxiety    Cannabinoid hyperemesis syndrome    Depression    Diabetes mellitus without complication (HCC)    GERD (gastroesophageal reflux disease)    History of anemia    during pregnancy   History of gestational diabetes    Ovarian cyst    left   Pelvic pain    Upper respiratory symptom    symptoms started 02-16-2021 cough/ runny nose;  pcp visit 03-10-2021 with sore throat/ congestion/ sob/ body aches/ cough but no fever, negative covid/ flu/ strep resultsin epic  (03-13-2021  pt stated today only has nonproductive cough)    Assessment: Patient Reported Symptoms:  Cognitive Cognitive Status: Able to follow simple commands, Alert and oriented to person, place, and time, Normal speech and language skills Cognitive/Intellectual Conditions Management [RPT]: None reported or documented in medical history or problem list   Health Maintenance Behaviors: Annual physical exam Health Facilitated by: Healthy diet, Rest  Neurological Neurological Review of Symptoms: Numbness (Chronic L foot numbness) Neurological Management Strategies: Coping strategies, Routine screening  HEENT HEENT Symptoms Reported: Other: HEENT Management Strategies: Routine screening, Coping strategies    Cardiovascular Cardiovascular Symptoms Reported: No symptoms reported Does patient have uncontrolled Hypertension?: No Cardiovascular Management Strategies: Routine screening Cardiovascular Comment: Patient reports she only has high blood pressure when she is in the hospital due to episodes of vomiting. She does not take medication for  blood pressure and does not have a BP monitor at home  Respiratory Respiratory Symptoms Reported: Dry cough Additional Respiratory Details: Dry cough which patient attributes to smoking. Patient reports she smokes a lot of marijuana and half a pack of cigaretes a day. Patient denies a desire to cut back or quit tobacco/marijuana use. Note per chart review patient was referred to sleep medicine by PCP. Patient reports she has not heard from their office to schedule sleep study. Office phone number sent via MyChart Respiratory Management Strategies: Routine screening  Endocrine Endocrine Symptoms Reported: Nausea or vomiting Is patient diabetic?: No    Gastrointestinal Gastrointestinal Symptoms Reported: Reflux/heartburn, Nausea, Vomiting Additional Gastrointestinal Details: Patient reports heartburn relieved with ordered medications. Patient reports a low appetite. She reports she continues to smoke marijuana because it stimulates her appetite, allowing her to eat one meal a day. Patient understands marijuana is a major cause of issues related to nausea and vomiting. Last BM this mroning Gastrointestinal Management Strategies: Medication therapy Gastrointestinal Comment: Note per chart review patient was referred to GI by PCP. Patient reports GI contacted her the other day, and she needs to call them back. Ensured patient has GI phone number    Genitourinary Genitourinary Symptoms Reported: No symptoms reported    Integumentary Integumentary Symptoms Reported: No symptoms reported    Musculoskeletal Musculoskelatal Symptoms Reviewed: No symptoms reported Additional Musculoskeletal Details: Patient reports chronic ankle and foot swelling due to previous fracture in 2023.   Falls in the past year?: No Number of falls in past year: 1 or less Was there an injury with Fall?: No Fall Risk Category Calculator: 0 Patient Fall Risk Level: Low Fall Risk Patient at Risk for Falls Due to: No Fall  Risks Fall risk Follow up: Falls evaluation completed, Education provided, Falls prevention discussed  Psychosocial Psychosocial Symptoms Reported: Anxiety - if selected complete GAD Additional Psychological Details: Patient declines referral to LCSW/psychiatrist/counselor Behavioral Management Strategies: Coping strategies Major Change/Loss/Stressor/Fears (CP): Denies Techniques to Cope with Loss/Stress/Change: Substance use (smoking marijuana or cigarette) Quality of Family Relationships: helpful, involved, stressful Do you feel physically threatened by others?: No    05/11/2024    PHQ2-9 Depression Screening   Little interest or pleasure in doing things Not at all  Feeling down, depressed, or hopeless Not at all  PHQ-2 - Total Score 0  Trouble falling or staying asleep, or sleeping too much    Feeling tired or having little energy    Poor appetite or overeating     Feeling bad about yourself - or that you are a failure or have let yourself or your family down    Trouble concentrating on things, such as reading the newspaper or watching television    Moving or speaking so slowly that other people could have noticed.  Or the opposite - being so fidgety or restless that you have been moving around a lot more than usual    Thoughts that you would be better off dead, or hurting yourself in some way    PHQ2-9 Total Score    If you checked off any problems, how difficult have these problems made it for you to do your work, take care of things at home, or get along with other people    Depression Interventions/Treatment      There were no vitals filed for this visit. Pain Scale: 0-10 Pain Score: 0-No pain  Medications Reviewed Today     Reviewed by Arno Rosaline SQUIBB, RN (Registered Nurse) on 05/11/24 at 1515  Med List Status: <None>   Medication Order Taking? Sig Documenting Provider Last Dose Status Informant  acetaminophen  (TYLENOL ) 325 MG tablet [id number]  Take 2 tablets (650 mg  total) by mouth every 6 (six) hours as needed for mild pain (pain score 1-3) or fever (or Fever >/= 101).  Patient not taking: Reported on 05/11/2024   Barbarann Nest, MD  Active   amLODipine  (NORVASC ) 5 MG tablet [id number]  Take 1 tablet (5 mg total) by mouth daily.  Patient not taking: Reported on 05/11/2024   Barbarann Nest, MD  Active   capsicum (ZOSTRIX) 0.075 % topical cream [id number] Yes Apply topically 2 (two) times daily. Barbarann Nest, MD  Active   famotidine  (PEPCID ) 20 MG tablet [id number] Yes Take 1 tablet (20 mg total) by mouth 2 (two) times daily. Barbarann Nest, MD  Active   HYDROcodone  bit-homatropine [facility]) 5-1.5 MG/5ML syrup [id number]  Take 5 mLs by mouth every 8 (eight) hours as needed for cough.  Patient not taking: Reported on 05/11/2024   Frann Mabel Mt, DO  Active   hydrOXYzine  (ATARAX ) 25 MG tablet [id number]  Take 1 tablet (25 mg total) by mouth 3 (three) times daily as needed for anxiety (refractory n/v).  Patient not taking: Reported on 05/11/2024   Frann Mabel Mt, DO  Active Self, Pharmacy Records           Med Note [facility], [facility] R   Sun Mar 04, 2024  8:22 AM) LF 01/23/2024  metoCLOPramide  (REGLAN ) 10 MG tablet [id number] Yes Take 0.5 tablets (5 mg total) by mouth 3 (three) times daily.  Patient taking differently: Take 5 mg by mouth 3 (three) times daily. When I'm in the hospital throwing up  Barbarann Nest, MD  Active Self, Pharmacy Records  ondansetron  (ZOFRAN ) 4 MG tablet [id number] Yes Take 1 tablet (4 mg total) by mouth every 6 (six) hours.  Patient taking differently: Take 4 mg by mouth every 6 (six) hours. When I'm in the hospital throwing up   Nivia Colon, PA-C  Active Self, Pharmacy Records  pantoprazole  (PROTONIX ) 40 MG tablet [id number] Yes Take 1 tablet (40 mg total) by mouth daily. Barbarann Nest, MD  Active   potassium chloride  (KLOR-CON  M) 10 MEQ tablet [id number] Yes Take 2 tablets (20 mEq total) by mouth daily for 5  days. Frann Mabel Mt, DO  Active   prochlorperazine  (COMPAZINE ) 10 MG tablet [id number] Yes Take 1 tablet (10 mg total) by mouth 2 (two) times daily as needed for nausea or vomiting.  Patient taking differently: Take 10 mg by mouth 2 (two) times daily as needed for nausea or vomiting. When I'm in the hospital throwing up   Neysa Caron PARAS, DO  Active Self, Pharmacy Records            Recommendation:   Specialty provider follow-up GI and sleep medicine - patient to call and schedule appointments Continue Current Plan of Care Patient to call dentist office to request transfer to [facility] location Patient to call member services number on back of insurance card to request the name of 2-3 in-network dental providers  Follow Up Plan:   Telephone follow up appointment date/time:  05/25/24 at 2:30 PM  Rosaline Finlay, RN MSN   [facility] RN Care Manager Direct Dial: [phone]  Fax: [phone]

## 2024-05-25 ENCOUNTER — Telehealth
